# Patient Record
Sex: Female | Born: 1995
Health system: Southern US, Community
[De-identification: ages and names within clinical notes are randomized; demographics above are authoritative.]

## PROBLEM LIST (undated history)

## (undated) DIAGNOSIS — D649 Anemia, unspecified: Secondary | ICD-10-CM

## (undated) DIAGNOSIS — F419 Anxiety disorder, unspecified: Secondary | ICD-10-CM

## (undated) DIAGNOSIS — I5181 Takotsubo syndrome: Secondary | ICD-10-CM

## (undated) DIAGNOSIS — K859 Acute pancreatitis without necrosis or infection, unspecified: Secondary | ICD-10-CM

## (undated) DIAGNOSIS — I10 Essential (primary) hypertension: Secondary | ICD-10-CM

## (undated) HISTORY — DX: Anemia, unspecified: D64.9

---
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
## 2017-05-23 ENCOUNTER — Encounter (HOSPITAL_COMMUNITY): Payer: Self-pay | Admitting: *Deleted

## 2017-05-23 ENCOUNTER — Emergency Department (HOSPITAL_COMMUNITY)
Admission: EM | Admit: 2017-05-23 | Discharge: 2017-05-24 | Disposition: A | Discharge status: Home / Self Care | Payer: Medicaid Other | Attending: Emergency Medicine | Admitting: Emergency Medicine

## 2017-05-23 DIAGNOSIS — Z79899 Other long term (current) drug therapy: Secondary | ICD-10-CM | POA: Diagnosis not present

## 2017-05-23 DIAGNOSIS — N760 Acute vaginitis: Secondary | ICD-10-CM | POA: Diagnosis not present

## 2017-05-23 DIAGNOSIS — B9689 Other specified bacterial agents as the cause of diseases classified elsewhere: Secondary | ICD-10-CM

## 2017-05-23 DIAGNOSIS — N938 Other specified abnormal uterine and vaginal bleeding: Secondary | ICD-10-CM

## 2017-05-23 LAB — WET PREP, GENITAL
Sperm: NONE SEEN
Trich, Wet Prep: NONE SEEN
Yeast Wet Prep HPF POC: NONE SEEN

## 2017-05-23 LAB — PREGNANCY, URINE: Preg Test, Ur: NEGATIVE

## 2017-05-23 LAB — URINALYSIS, ROUTINE W REFLEX MICROSCOPIC
Glucose, UA: NEGATIVE mg/dL
Hgb urine dipstick: NEGATIVE
Ketones, ur: NEGATIVE mg/dL
Leukocytes, UA: NEGATIVE
Nitrite: NEGATIVE
Protein, ur: 100 mg/dL — AB
Specific Gravity, Urine: 1.034 — ABNORMAL HIGH (ref 1.005–1.030)
pH: 5 (ref 5.0–8.0)

## 2017-05-23 MED ORDER — NAPROXEN 250 MG PO TABS: 250.0000 mg | ORAL_TABLET | Freq: Two times a day (BID) | ORAL | 0 refills | Status: DC | PRN

## 2017-05-23 MED ORDER — METRONIDAZOLE 500 MG PO TABS: 500.0000 mg | ORAL_TABLET | Freq: Two times a day (BID) | ORAL | 0 refills | Status: DC

## 2017-05-23 NOTE — ED Provider Notes (Signed)
[facility] Provider Note   CSN: [id number] Arrival date & time: 05/23/17  2128     History   Chief Complaint Chief Complaint  Patient presents with  . Vaginal Bleeding    HPI Melissa Guzman is a 21 y.o. female.  HPI  Pt was seen at 2145. Per pt, c/o gradual onset and persistence of constant vaginal bleeding for the past week. Has been associated with pelvic "cramping." Pt states she took a home pregnancy test several days ago that was negative. Denies vaginal discharge, no N/V/D, no back pain, no CP/SOB, no fevers, no dysuria/hematuria.    History reviewed. No pertinent past medical history.  There are no active problems to display for this patient.   History reviewed. No pertinent surgical history.  OB History    No data available       Home Medications    Prior to Admission medications   Medication Sig Start Date End Date Taking? Authorizing Provider  Chlorphen-Pseudoephed-APAP (THERAFLU FLU/COLD PO) Take 2 tablets by mouth every 8 (eight) hours as needed.    [provider]  guaifenesin (ROBITUSSIN) 100 MG/5ML syrup Take 5-10 mLs (100-200 mg total) by mouth every 4 (four) hours as needed for cough. 01/27/14   Rancour, Jeannett Senior, MD  ibuprofen (ADVIL,MOTRIN) 600 MG tablet Take 1 tablet (600 mg total) by mouth 4 (four) times daily. Take with meals and at bedtime. 06/03/16   Ivery Quale, PA-C  methocarbamol (ROBAXIN) 500 MG tablet Take 1 tablet (500 mg total) by mouth 3 (three) times daily. 06/03/16   Ivery Quale, PA-C  Phenyleph-Doxylamine-DM-APAP (ALKA SELTZER PLUS PO) Take 2 tablets by mouth every 8 (eight) hours as needed (cold/flu).    [provider]    Family History History reviewed. No pertinent family history.  Social History Social History  Substance Use Topics  . Smoking status: Never Smoker  . Smokeless tobacco: Never Used  . Alcohol use No     Allergies   Patient has no known allergies.   Review of Systems Review of  Systems ROS: Statement: All systems negative except as marked or noted in the HPI; Constitutional: Negative for fever and chills. ; ; Eyes: Negative for eye pain, redness and discharge. ; ; ENMT: Negative for ear pain, hoarseness, nasal congestion, sinus pressure and sore throat. ; ; Cardiovascular: Negative for chest pain, palpitations, diaphoresis, dyspnea and peripheral edema. ; ; Respiratory: Negative for cough, wheezing and stridor. ; ; Gastrointestinal: Negative for nausea, vomiting, diarrhea, abdominal pain, blood in stool, hematemesis, jaundice and rectal bleeding. . ; ; Genitourinary: Negative for dysuria, flank pain and hematuria. ; ; GYN:  +pelvic pain, +vaginal bleeding, no vaginal discharge, no vulvar pain. ;; Musculoskeletal: Negative for back pain and neck pain. Negative for swelling and trauma.; ; Skin: Negative for pruritus, rash, abrasions, blisters, bruising and skin lesion.; ; Neuro: Negative for headache, lightheadedness and neck stiffness. Negative for weakness, altered level of consciousness, altered mental status, extremity weakness, paresthesias, involuntary movement, seizure and syncope.       Physical Exam Updated Vital Signs BP 140/89 (BP Location: Right Arm)   Pulse (!) 105   Temp 98.3 F (36.8 C) (Oral)   Resp 18   Ht 5\' 2"  (1.575 m)   Wt 72.2 kg (159 lb 4 oz)   LMP 04/05/2017   SpO2 98%   BMI 29.13 kg/m   Physical Exam 2150: Physical examination:  Nursing notes reviewed; Vital signs and O2 SAT reviewed;  Constitutional: Well developed, Well nourished,  Well hydrated, In no acute distress; Head:  Normocephalic, atraumatic; Eyes: EOMI, PERRL, No scleral icterus; ENMT: Mouth and pharynx normal, Mucous membranes moist; Neck: Supple, Full range of motion, No lymphadenopathy; Cardiovascular: Regular rate and rhythm, No gallop; Respiratory: Breath sounds clear & equal bilaterally, No wheezes.  Speaking full sentences with ease, Normal respiratory effort/excursion; Chest:  Nontender, Movement normal; Abdomen: Soft, Nontender, Nondistended, Normal bowel sounds; Genitourinary: No CVA tenderness. Pelvic exam performed with permission of pt and female ED RN assist during exam.  External genitalia w/o lesions. Vaginal vault without discharge, small amount of dark blood.  Cervix w/o lesions, not friable, GC/chlam and wet prep obtained and sent to lab.  Bimanual exam w/o CMT or adnexal tenderness. +mild suprapubic tenderness.;; Extremities: Pulses normal, No tenderness, No edema, No calf edema or asymmetry.; Neuro: AA&Ox3, Major CN grossly intact.  Speech clear. No gross focal motor or sensory deficits in extremities. Climbs on and off stretcher easily by herself. Gait steady.; Skin: Color normal, Warm, Dry.   ED Treatments / Results  Labs (all labs ordered are listed, but only abnormal results are displayed)   EKG  EKG Interpretation None       Radiology   Procedures Procedures (including critical care time)  Medications Ordered in ED Medications - No data to display   Initial Impression / Assessment and Plan / ED Course  I have reviewed the triage vital signs and the nursing notes.  Pertinent labs & imaging results that were available during my care of the patient were reviewed by me and considered in my medical decision making (see chart for details).  MDM Reviewed: previous chart, nursing note and vitals Reviewed previous: labs    Results for orders placed or performed during the hospital encounter of 05/23/17  Wet prep, genital  Result Value Ref Range   Yeast Wet Prep HPF POC NONE SEEN NONE SEEN   Trich, Wet Prep NONE SEEN NONE SEEN   Clue Cells Wet Prep HPF POC PRESENT (A) NONE SEEN   WBC, Wet Prep HPF POC FEW (A) NONE SEEN   Sperm NONE SEEN   Pregnancy, urine  Result Value Ref Range   Preg Test, Ur NEGATIVE NEGATIVE  Urinalysis, Routine w reflex microscopic  Result Value Ref Range   Color, Urine YELLOW YELLOW   APPearance CLEAR CLEAR     Specific Gravity, Urine 1.034 (H) 1.005 - 1.030   pH 5.0 5.0 - 8.0   Glucose, UA NEGATIVE NEGATIVE mg/dL   Hgb urine dipstick NEGATIVE NEGATIVE   Bilirubin Urine SMALL (A) NEGATIVE   Ketones, ur NEGATIVE NEGATIVE mg/dL   Protein, ur 179 (A) NEGATIVE mg/dL   Nitrite NEGATIVE NEGATIVE   Leukocytes, UA NEGATIVE NEGATIVE   RBC / HPF 0-5 0 - 5 RBC/hpf   WBC, UA 0-5 0 - 5 WBC/hpf   Bacteria, UA RARE (A) NONE SEEN   Squamous Epithelial / LPF 0-5 (A) NONE SEEN   Mucus PRESENT      2355:  Pregnancy test negative. Will tx for BV. Dx and testing d/w pt.  Questions answered.  Verb understanding, agreeable to d/c home with outpt f/u.     Final Clinical Impressions(s) / ED Diagnoses   Final diagnoses:  None    New Prescriptions New Prescriptions   No medications on file     Samuel Jester, DO 05/28/17 1511

## 2017-05-23 NOTE — ED Notes (Signed)
Pelvic cart to bedside 

## 2017-05-23 NOTE — Discharge Instructions (Signed)
Take the prescriptions as directed.  Call your regular OB/GYN doctor on Monday to schedule a follow up appointment within the next week.  Return to the Emergency Department immediately sooner if worsening.

## 2017-05-23 NOTE — ED Triage Notes (Signed)
Pt reports pelvic pain, vaginal bleeding, abdominal pain and cramping. Pt reports a having a "heavier" period then usual and is thinking she may be having a miscarriage. Pt took a pregnancy test several days ago and it was negative.

## 2017-05-25 LAB — GC/CHLAMYDIA PROBE AMP (~~LOC~~) NOT AT ARMC
Chlamydia: NEGATIVE
Neisseria Gonorrhea: NEGATIVE

## 2020-01-09 ENCOUNTER — Emergency Department (HOSPITAL_COMMUNITY)
Admission: EM | Admit: 2020-01-09 | Discharge: 2020-01-09 | Disposition: A | Discharge status: Home / Self Care | Payer: Medicaid Other | Attending: Emergency Medicine | Admitting: Emergency Medicine

## 2020-01-09 ENCOUNTER — Other Ambulatory Visit: Payer: Self-pay

## 2020-01-09 ENCOUNTER — Encounter (HOSPITAL_COMMUNITY): Payer: Self-pay | Admitting: *Deleted

## 2020-01-09 DIAGNOSIS — N3090 Cystitis, unspecified without hematuria: Secondary | ICD-10-CM

## 2020-01-09 DIAGNOSIS — R1033 Periumbilical pain: Secondary | ICD-10-CM | POA: Diagnosis present

## 2020-01-09 DIAGNOSIS — R10816 Epigastric abdominal tenderness: Secondary | ICD-10-CM | POA: Diagnosis not present

## 2020-01-09 DIAGNOSIS — N3000 Acute cystitis without hematuria: Secondary | ICD-10-CM | POA: Diagnosis not present

## 2020-01-09 DIAGNOSIS — R1030 Lower abdominal pain, unspecified: Secondary | ICD-10-CM

## 2020-01-09 LAB — CBC
HCT: 37.1 % (ref 36.0–46.0)
Hemoglobin: 11.6 g/dL — ABNORMAL LOW (ref 12.0–15.0)
MCH: 29.4 pg (ref 26.0–34.0)
MCHC: 31.3 g/dL (ref 30.0–36.0)
MCV: 93.9 fL (ref 80.0–100.0)
Platelets: 257 10*3/uL (ref 150–400)
RBC: 3.95 MIL/uL (ref 3.87–5.11)
RDW: 13 % (ref 11.5–15.5)
WBC: 10 10*3/uL (ref 4.0–10.5)
nRBC: 0 % (ref 0.0–0.2)

## 2020-01-09 LAB — COMPREHENSIVE METABOLIC PANEL
ALT: 15 U/L (ref 0–44)
AST: 21 U/L (ref 15–41)
Albumin: 4.2 g/dL (ref 3.5–5.0)
Alkaline Phosphatase: 50 U/L (ref 38–126)
Anion gap: 10 (ref 5–15)
BUN: 9 mg/dL (ref 6–20)
CO2: 24 mmol/L (ref 22–32)
Calcium: 9.3 mg/dL (ref 8.9–10.3)
Chloride: 103 mmol/L (ref 98–111)
Creatinine, Ser: 0.9 mg/dL (ref 0.44–1.00)
GFR calc Af Amer: >60 mL/min (ref >60)
GFR calc non Af Amer: >60 mL/min (ref >60)
Glucose, Bld: 99 mg/dL (ref 70–99)
Potassium: 4.1 mmol/L (ref 3.5–5.1)
Sodium: 137 mmol/L (ref 135–145)
Total Bilirubin: 0.6 mg/dL (ref 0.3–1.2)
Total Protein: 7.6 g/dL (ref 6.5–8.1)

## 2020-01-09 LAB — URINALYSIS, ROUTINE W REFLEX MICROSCOPIC
Bilirubin Urine: NEGATIVE
Glucose, UA: NEGATIVE mg/dL
Hgb urine dipstick: NEGATIVE
Ketones, ur: 5 mg/dL — AB
Leukocytes,Ua: NEGATIVE
Nitrite: POSITIVE — AB
Protein, ur: 30 mg/dL — AB
Specific Gravity, Urine: 1.03 (ref 1.005–1.030)
pH: 5 (ref 5.0–8.0)

## 2020-01-09 LAB — I-STAT BETA HCG BLOOD, ED (MC, WL, AP ONLY): I-stat hCG, quantitative: <5 m[IU]/mL (ref <5)

## 2020-01-09 LAB — LIPASE, BLOOD: Lipase: 22 U/L (ref 11–51)

## 2020-01-09 MED ORDER — NITROFURANTOIN MONOHYD MACRO 100 MG PO CAPS: 100.0000 mg | ORAL_CAPSULE | Freq: Two times a day (BID) | ORAL | 0 refills | Status: AC

## 2020-01-09 MED ORDER — SODIUM CHLORIDE 0.9% FLUSH: 3.0000 mL | Freq: Once | INTRAVENOUS | Status: DC

## 2020-01-09 NOTE — ED Notes (Signed)
Patient verbalizes understanding of discharge instructions. Opportunity for questioning and answers were provided. Armband removed by staff, pt discharged from ED ambulatory.   

## 2020-01-09 NOTE — ED Triage Notes (Signed)
Pt reports onset last night of lower abd pain, difficulty urinating and feels constipated, last bm was two days ago.

## 2020-01-09 NOTE — ED Provider Notes (Signed)
[facility] Provider Note   CSN: [id number] Arrival date & time: 01/09/20  0932     History Chief Complaint  Patient presents with  . Abdominal Pain    Melissa Guzman is a 24 y.o. female.  HPI  HPI Comments: Melissa [facility] is a 24 y.o. female who presents to the [facility] complaining of 2 days of mild constipation.  Patient reports some mild pain in the periumbilical region and suprapubic region diffusely.  She took 2 Motrin this morning with no relief.  She states her pain is constant, 10/10, worse when bearing down.  She states her last BM was 2 days ago and was normal at the time.  No hematochezia.  She states her last menstrual period was on March 28 and lasted 2 days and was normal for her.  She denies any fevers, chills, URI symptoms, chest pain, shortness of breath, nausea, vomiting, diarrhea, urinary changes, dizziness, syncope, vaginal discharge, vaginal bleeding.     History reviewed. No pertinent past medical history.  There are no problems to display for this patient.   History reviewed. No pertinent surgical history.   OB History   No obstetric history on file.     History reviewed. No pertinent family history.  Social History   Tobacco Use  . Smoking status: Never Smoker  . Smokeless tobacco: Never Used  Substance Use Topics  . Alcohol use: No  . Drug use: No    Home Medications Prior to Admission medications   Medication Sig Start Date End Date Taking? Authorizing Provider  metroNIDAZOLE (FLAGYL) 500 MG tablet Take 1 tablet (500 mg total) by mouth 2 (two) times daily. 05/23/17   Francine Graven, DO  naproxen (NAPROSYN) 250 MG tablet Take 1 tablet (250 mg total) by mouth 2 (two) times daily as needed for mild pain or moderate pain (take with food). 05/23/17   Francine Graven, DO    Allergies    Patient has no known allergies.  Review of Systems   Review of Systems  Constitutional: Negative for activity  change, appetite change, chills and fever.  HENT: Negative for congestion and rhinorrhea.   Respiratory: Negative for cough, chest tightness and shortness of breath.   Cardiovascular: Negative for chest pain and leg swelling.  Gastrointestinal: Positive for abdominal pain and constipation. Negative for diarrhea, nausea and vomiting.  Genitourinary: Negative for decreased urine volume, difficulty urinating, dysuria, hematuria, urgency, vaginal bleeding, vaginal discharge and vaginal pain.  Musculoskeletal: Negative for back pain and myalgias.  Neurological: Negative for syncope, numbness and headaches.  All other systems reviewed and are negative.  Physical Exam Updated Vital Signs BP 125/88 (BP Location: Right Arm)   Pulse 99   Temp 98.2 F (36.8 C) (Oral)   Resp 16   LMP 12/25/2019   SpO2 99%   Physical Exam Vitals and nursing note reviewed.  Constitutional:      General: She is not in acute distress.    Appearance: She is well-developed and normal weight. She is not ill-appearing, toxic-appearing or diaphoretic.  HENT:     Head: Normocephalic and atraumatic.     Mouth/Throat:     Mouth: Mucous membranes are moist.  Eyes:     Extraocular Movements: Extraocular movements intact.     Pupils: Pupils are equal, round, and reactive to light.  Cardiovascular:     Rate and Rhythm: Normal rate and regular rhythm.     Heart sounds: Normal heart sounds. No murmur. No friction  rub. No gallop.   Pulmonary:     Effort: Pulmonary effort is normal. No respiratory distress.     Breath sounds: Normal breath sounds. No stridor. No wheezing, rhonchi or rales.  Abdominal:     General: Abdomen is flat. Bowel sounds are normal. There is no distension or abdominal bruit. There are no signs of injury.     Palpations: Abdomen is soft.     Tenderness: There is abdominal tenderness in the epigastric area, periumbilical area and suprapubic area. There is no right CVA tenderness, left CVA tenderness,  guarding or rebound. Negative signs include Murphy's sign and McBurney's sign.     Hernia: No hernia is present.  Skin:    General: Skin is warm and dry.     Capillary Refill: Capillary refill takes less than 2 seconds.  Neurological:     General: No focal deficit present.     Mental Status: She is alert and oriented to person, place, and time.     Motor: No weakness.  Psychiatric:        Mood and Affect: Mood normal.        Behavior: Behavior normal.     ED Results / Procedures / Treatments   Labs (all labs ordered are listed, but only abnormal results are displayed) Labs Reviewed  CBC - Abnormal; Notable for the following components:      Result Value   Hemoglobin 11.6 (*)    All other components within normal limits  URINALYSIS, ROUTINE W REFLEX MICROSCOPIC - Abnormal; Notable for the following components:   APPearance HAZY (*)    Ketones, ur 5 (*)    Protein, ur 30 (*)    Nitrite POSITIVE (*)    Bacteria, UA MANY (*)    All other components within normal limits  LIPASE, BLOOD  COMPREHENSIVE METABOLIC PANEL  I-STAT BETA HCG BLOOD, ED ([facility], [facility], [facility] ONLY)    EKG None  Radiology No results found.  Procedures Procedures  Medications Ordered in ED Medications  sodium chloride flush (NS) 0.9 % injection 3 mL (has no administration in time range)    ED Course  I have reviewed the triage vital signs and the nursing notes.  Pertinent labs & imaging results that were available during my care of the patient were reviewed by me and considered in my medical decision making (see chart for details).    MDM Rules/Calculators/A&P                      Patient is a pleasant 24 year old African-American female that presents with 2 days of constipation and abdominal pain.  Physical exam is significant for some mild suprapubic and periumbilical pain with deep palpation of the abdomen.  Otherwise physical exam is reassuring.  She does not have a surgical abdomen.  She is afebrile.   Urinalysis is significant for nitrites and many bacteria.  Her hemoglobin is slightly decreased at 11.6.  No leukocytosis.  No signs of systemic infection.  She is not pregnant.  CMP is unremarkable.  She has no vaginal complaints at this time.  I discussed her lab results and is likely being acute cystitis.  We discussed the antibiotic regimen and that she needs to complete these in their entirety.  She states she will follow up with her primary care provider regarding this visit as well as to discuss symptom resolution.  She knows to return to the emergency [facility] with any new or worsening symptoms.  Her questions  were answered and she was amicable at the time of discharge.  Vital signs stable.  Final Clinical Impression(s) / ED Diagnoses Final diagnoses:  Cystitis  Lower abdominal pain    Rx / DC Orders ED Discharge Orders         Ordered    nitrofurantoin, macrocrystal-monohydrate, (MACROBID) 100 MG capsule  2 times daily     01/09/20 1306           Placido Sou, PA-C 01/09/20 1315    Melene Plan, DO 01/09/20 1318

## 2020-01-09 NOTE — Discharge Instructions (Signed)
Per discussion, please take your antibiotic in its entirety.  You are being given this for a UTI.  Please not hesitate to return the emergency department with any new or worsening symptoms.  Please follow-up with your primary care provider regarding this visit.

## 2020-07-30 ENCOUNTER — Encounter (HOSPITAL_BASED_OUTPATIENT_CLINIC_OR_DEPARTMENT_OTHER): Payer: Self-pay | Admitting: Emergency Medicine

## 2020-07-30 ENCOUNTER — Other Ambulatory Visit: Payer: Self-pay

## 2020-07-30 ENCOUNTER — Emergency Department (HOSPITAL_BASED_OUTPATIENT_CLINIC_OR_DEPARTMENT_OTHER): Payer: No Typology Code available for payment source

## 2020-07-30 ENCOUNTER — Other Ambulatory Visit (HOSPITAL_BASED_OUTPATIENT_CLINIC_OR_DEPARTMENT_OTHER): Payer: Self-pay | Admitting: Emergency Medicine

## 2020-07-30 ENCOUNTER — Emergency Department (HOSPITAL_BASED_OUTPATIENT_CLINIC_OR_DEPARTMENT_OTHER)
Admission: EM | Admit: 2020-07-30 | Discharge: 2020-07-30 | Disposition: A | Discharge status: Home / Self Care | Payer: No Typology Code available for payment source | Attending: Emergency Medicine | Admitting: Emergency Medicine

## 2020-07-30 DIAGNOSIS — S161XXA Strain of muscle, fascia and tendon at neck level, initial encounter: Secondary | ICD-10-CM

## 2020-07-30 DIAGNOSIS — M545 Low back pain, unspecified: Secondary | ICD-10-CM | POA: Diagnosis not present

## 2020-07-30 DIAGNOSIS — S0083XA Contusion of other part of head, initial encounter: Secondary | ICD-10-CM | POA: Diagnosis not present

## 2020-07-30 DIAGNOSIS — M546 Pain in thoracic spine: Secondary | ICD-10-CM

## 2020-07-30 DIAGNOSIS — S199XXA Unspecified injury of neck, initial encounter: Secondary | ICD-10-CM | POA: Diagnosis present

## 2020-07-30 MED ORDER — IBUPROFEN 800 MG PO TABS: 800.0000 mg | ORAL_TABLET | Freq: Three times a day (TID) | ORAL | 0 refills | Status: DC | PRN

## 2020-07-30 MED ORDER — METHOCARBAMOL 500 MG PO TABS: 500.0000 mg | ORAL_TABLET | Freq: Four times a day (QID) | ORAL | 0 refills | Status: DC | PRN

## 2020-07-30 MED FILL — IBUPROFEN 800 MG TAB: 800 | 7 days supply | Qty: 21 | Fill #0

## 2020-07-30 MED FILL — METHOCARBAMOL 500 MG TABS: 500 | 5 days supply | Qty: 20 | Fill #0
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
## 2020-09-29 ENCOUNTER — Ambulatory Visit (INDEPENDENT_AMBULATORY_CARE_PROVIDER_SITE_OTHER): Payer: Medicaid Other

## 2020-09-29 ENCOUNTER — Other Ambulatory Visit: Payer: Self-pay

## 2020-09-29 ENCOUNTER — Ambulatory Visit (HOSPITAL_COMMUNITY)
Admission: EM | Admit: 2020-09-29 | Discharge: 2020-09-29 | Disposition: A | Discharge status: Home / Self Care | Payer: Medicaid Other | Attending: Emergency Medicine | Admitting: Emergency Medicine

## 2020-09-29 ENCOUNTER — Encounter (HOSPITAL_COMMUNITY): Payer: Self-pay | Admitting: Emergency Medicine

## 2020-09-29 DIAGNOSIS — Z20822 Contact with and (suspected) exposure to covid-19: Secondary | ICD-10-CM | POA: Insufficient documentation

## 2020-09-29 DIAGNOSIS — U071 COVID-19: Secondary | ICD-10-CM | POA: Insufficient documentation

## 2020-09-29 DIAGNOSIS — J069 Acute upper respiratory infection, unspecified: Secondary | ICD-10-CM

## 2020-09-29 DIAGNOSIS — R059 Cough, unspecified: Secondary | ICD-10-CM | POA: Diagnosis not present

## 2020-09-29 LAB — SARS CORONAVIRUS 2 (TAT 6-24 HRS): SARS Coronavirus 2: POSITIVE — AB

## 2020-09-29 MED ORDER — BENZONATATE 200 MG PO CAPS: 200.0000 mg | ORAL_CAPSULE | Freq: Three times a day (TID) | ORAL | 0 refills | Status: DC | PRN

## 2020-09-29 MED ORDER — FLUTICASONE PROPIONATE 50 MCG/ACT NA SUSP: 2.0000 | Freq: Every day | NASAL | 0 refills | Status: DC

## 2020-09-29 MED ORDER — IBUPROFEN 600 MG PO TABS: 600.0000 mg | ORAL_TABLET | Freq: Four times a day (QID) | ORAL | 0 refills | Status: DC | PRN

## 2020-09-29 MED ORDER — HYDROCOD POLST-CPM POLST ER 10-8 MG/5ML PO SUER: 5.0000 mL | Freq: Two times a day (BID) | ORAL | 0 refills | Status: DC | PRN

## 2020-09-29 NOTE — ED Triage Notes (Signed)
PT C/O: cold sx onset 1.5 weeks associated w/cough, runny nose, sore throat, SOB, dyspnea when laying down.   O2 today was 88% but after taking deep breaths and coughing it went up to 92% sitting/resting.   Reports mother tested positive for COVID this morning.   DENIES: f/v/n/d  TAKING MEDS: none  A&O x4... NAD... Ambulatory

## 2020-09-29 NOTE — ED Provider Notes (Signed)
HPI  SUBJECTIVE:  Melissa Guzman is a 25 y.o. female who presents with being "sick" for the past week and a half.  Her mother who lives with her tested positive for Covid today.  She reports body aches, headaches and nasal congestion, rhinorrhea, postnasal drip, cough, shortness of breath with lying down only.  No fevers, sore throat, loss of sense of smell or taste, wheezing, nausea, vomiting or diarrhea, abdominal pain.  No ear pain, sinus pain or pressure.  States that she has been unable to sleep secondary to the cough.  She has had no known flu exposure.  She had the first dose of Covid vaccine in mid November.  She did not get the flu vaccine.  No antipyretic in the past 6 hours.  She has tried over-the-counter cold and flu medicines, hot tea with improvement in her symptoms.  No aggravating factors.  Patient has no past medical history.  LMP: Last week.  Denies possibility pregnant.  PMD: [facility].    History reviewed. No pertinent past medical history.  History reviewed. No pertinent surgical history.  History reviewed. No pertinent family history.  Social History   Tobacco Use  . Smoking status: Never Smoker  . Smokeless tobacco: Never Used  Vaping Use  . Vaping Use: Never used  Substance Use Topics  . Alcohol use: No  . Drug use: No    No current facility-administered medications for this encounter.  Current Outpatient Medications:  .  benzonatate (TESSALON) 200 MG capsule, Take 1 capsule (200 mg total) by mouth 3 (three) times daily as needed for cough., Disp: 30 capsule, Rfl: 0 .  chlorpheniramine-HYDROcodone (TUSSIONEX PENNKINETIC ER) 10-8 MG/5ML SUER, Take 5 mLs by mouth every 12 (twelve) hours as needed for cough., Disp: 60 mL, Rfl: 0 .  fluticasone (FLONASE) 50 MCG/ACT nasal spray, Place 2 sprays into both nostrils daily., Disp: 16 g, Rfl: 0 .  ibuprofen (ADVIL) 600 MG tablet, Take 1 tablet (600 mg total) by mouth every 6 (six) hours as needed., Disp:  30 tablet, Rfl: 0  No Known Allergies   ROS  As noted in HPI.   Physical Exam  BP 133/88 (BP Location: Left Arm)   Pulse 72   Temp 99.1 F (37.3 C) (Oral)   Resp 16   LMP 09/22/2020   SpO2 100%   Constitutional: Well developed, well nourished, no acute distress.  Coughing Eyes:  EOMI, conjunctiva normal bilaterally HENT: Normocephalic, atraumatic,mucus membranes moist.  Swollen erythematous turbinates, clear nasal congestion.  No maxillary, frontal sinus tenderness.  Normal oropharynx, uvula midline.  Positive cobblestoning and postnasal drip. Neck: Positive cervical lymphadenopathy Respiratory: Normal inspiratory effort, lungs clear bilaterally Cardiovascular: Normal rate regular rhythm no murmurs rubs gallops GI: nondistended skin: No rash, skin intact Musculoskeletal: no deformities Neurologic: Alert & oriented x 3, no focal neuro deficits Psychiatric: Speech and behavior appropriate   ED Course   Medications - No data to display  Orders Placed This Encounter  Procedures  . SARS CORONAVIRUS 2 (TAT 6-24 HRS) Nasopharyngeal Nasopharyngeal Swab    Standing Status:   Standing    Number of Occurrences:   1    Order Specific Question:   Is this test for diagnosis or screening    Answer:   Diagnosis of ill patient    Order Specific Question:   Symptomatic for COVID-19 as defined by CDC    Answer:   Yes    Order Specific Question:   Date of Symptom Onset  Answer:   09/19/2020    Order Specific Question:   Hospitalized for COVID-19    Answer:   No    Order Specific Question:   Admitted to ICU for COVID-19    Answer:   No    Order Specific Question:   Previously tested for COVID-19    Answer:   No    Order Specific Question:   Resident in a congregate (group) care setting    Answer:   No    Order Specific Question:   Employed in healthcare setting    Answer:   No    Order Specific Question:   Pregnant    Answer:   No    Order Specific Question:   Has patient  completed COVID vaccination(s) (2 doses of Pfizer/Moderna 1 dose of [employer])    Answer:   No  . DG Chest 2 View    Standing Status:   Standing    Number of Occurrences:   1    Order Specific Question:   Reason for Exam (SYMPTOM  OR DIAGNOSIS REQUIRED)    Answer:   cough covid exposure r/o PNA    Results for orders placed or performed during the hospital encounter of 09/29/20 (from the past 24 hour(s))  SARS CORONAVIRUS 2 (TAT 6-24 HRS) Nasopharyngeal Nasopharyngeal Swab     Status: Abnormal   Collection Time: 09/29/20  5:10 PM   Specimen: Nasopharyngeal Swab  Result Value Ref Range   SARS Coronavirus 2 POSITIVE (A) NEGATIVE   DG Chest 2 View  Result Date: 09/29/2020 CLINICAL DATA:  25 year old female with history of cough. COVID exposure. Evaluate for pneumonia. EXAM: CHEST - 2 VIEW COMPARISON:  No priors. FINDINGS: Lung volumes are normal. No consolidative airspace disease. No pleural effusions. No pneumothorax. No pulmonary nodule or mass noted. Pulmonary vasculature and the cardiomediastinal silhouette are within normal limits. IMPRESSION: No radiographic evidence of acute cardiopulmonary disease. Electronically Signed   By: Trudie Reed M.D.   On: 09/29/2020 15:40    ED Clinical Impression  1. COVID-19 virus infection   2. Viral URI with cough   3. Encounter for laboratory testing for COVID-19 virus      ED Assessment/Plan  Repeat pulse oximeter 100%.  She is comfortable, in no respiratory distress.  Checking Covid given close exposure to Covid.  Seriously doubt flu thus testing for this was not done.  Also check chest x-[place]ve given duration of symptoms.  She has no sinus tenderness that would be suggestive of a sinusitis.  [place] Narcotic database reviewed for this patient, and feel that the risk/benefit ratio today is favorable for proceeding with a prescription for controlled substance.  No opiate prescriptions in 2 years  Reviewed imaging independently.  No  pneumonia.  See radiology report for full details.  No pneumonia on x-Chiu, no evidence of sinusitis on exam, she has no ear complaints.  Unsure what I would be treating with antibiotics, so with holding them for now.  Plan to send home with Flonase, Mucinex D, saline nasal irrigation, Tylenol/ibuprofen, Tessalon for the cough during the day, Tussionex for the cough at night.  She may be a candidate for monoclonal body infusion as she is partially vaccinated, however she has no other medical comorbidities.  Advised her to get a pulse oximeter and to walk daily if her Covid is positive.  Follow-up with PMD as needed.  To the ER if she gets worse.  Covid positive.  Sent message to monoclonal  body team.  Discussed labs, imaging, MDM, treatment plan, and plan for follow-up with patient. Discussed sn/sx that should prompt return to the ED. patient agrees with plan.   Meds ordered this encounter  Medications  . benzonatate (TESSALON) 200 MG capsule    Sig: Take 1 capsule (200 mg total) by mouth 3 (three) times daily as needed for cough.    Dispense:  30 capsule    Refill:  0  . ibuprofen (ADVIL) 600 MG tablet    Sig: Take 1 tablet (600 mg total) by mouth every 6 (six) hours as needed.    Dispense:  30 tablet    Refill:  0  . fluticasone (FLONASE) 50 MCG/ACT nasal spray    Sig: Place 2 sprays into both nostrils daily.    Dispense:  16 g    Refill:  0  . chlorpheniramine-HYDROcodone (TUSSIONEX PENNKINETIC ER) 10-8 MG/5ML SUER    Sig: Take 5 mLs by mouth every 12 (twelve) hours as needed for cough.    Dispense:  60 mL    Refill:  0    *This clinic note was created using [employer]. Therefore, there may be occasional mistakes despite careful proofreading.   ?    Melynda Ripple, MD 09/30/20 1020

## 2020-09-29 NOTE — Discharge Instructions (Addendum)
Someone will contact you if your Covid comes back positive.  Flonase, saline nasal irrigation [facility] Med rinse and distilled water as often as you want, Mucinex D for the nasal congestion.  1000 mg of Tylenol combined with 600 mg of ibuprofen together 3-4 times a day as needed for body aches, headaches.  Tessalon for the cough during the day, Tussionex for the cough at night.  Get a pulse oximeter walk daily if your Covid is positive.

## 2021-04-01 ENCOUNTER — Other Ambulatory Visit: Payer: Self-pay

## 2021-04-01 ENCOUNTER — Encounter (HOSPITAL_COMMUNITY): Payer: Self-pay | Admitting: *Deleted

## 2021-04-01 ENCOUNTER — Ambulatory Visit (HOSPITAL_COMMUNITY)
Admission: EM | Admit: 2021-04-01 | Discharge: 2021-04-01 | Disposition: A | Discharge status: Home / Self Care | Payer: Medicaid Other | Attending: Family Medicine | Admitting: Family Medicine

## 2021-04-01 DIAGNOSIS — R519 Headache, unspecified: Secondary | ICD-10-CM

## 2021-04-01 NOTE — ED Triage Notes (Signed)
Pt reports HA that started yesterday in the morning

## 2021-04-03 NOTE — ED Provider Notes (Signed)
  [facility]   [id number] 04/01/21 Arrival Time: 1227  ASSESSMENT & PLAN:  1. Acute nonintractable headache, unspecified headache type     Normal neurological exam. Headache has resolved. Work note provided. No indication for neurodiagnostic workup at this time.  Recommend:  Follow-up Information     [facility], [facility] .   Specialty: Pediatrics Why: As needed. Contact information: [address] [place] [address] [phone]                  Reviewed expectations re: course of current medical issues. Questions answered. Outlined signs and symptoms indicating need for more acute intervention. Patient verbalized understanding. After Visit Summary given.   SUBJECTIVE: History from: Patient Patient is able to give a clear and coherent history.  Melissa Guzman is a 25 y.o. female who presents with complaint of a bad headache yesterday; frontal; missed work secondary. Without n/v/visual changes. Better this morning. Needs work note.    OBJECTIVE:  Vitals:   04/01/21 1239  BP: 118/75  Pulse: 95  Resp: 18  Temp: 98.9 F (37.2 C)  TempSrc: Oral  SpO2: 98%    General appearance: alert; NAD HENT: normocephalic; atraumatic Eyes: PERRLA; EOMI; conjunctivae normal Neck: supple with FROM Extremities: no edema; symmetrical with no gross deformities Skin: warm and dry Neurologic: alert; speech is fluent and clear without dysarthria or aphasia; CN 2-12 grossly intact; no facial droop; normal gait Psychological: alert and cooperative; normal mood and affect   No Known Allergies  History reviewed. No pertinent past medical history. Social History   Socioeconomic History   Marital status: Single    Spouse name: Not on file   Number of children: Not on file   Years of education: Not on file   Highest education level: Not on file  Occupational History   Not on file  Tobacco Use   Smoking status: Never   Smokeless tobacco:  Never  Vaping Use   Vaping Use: Never used  Substance and Sexual Activity   Alcohol use: No   Drug use: No   Sexual activity: Not on file  Other Topics Concern   Not on file  Social History Narrative   Not on file   Social Determinants of Health   Financial Resource Strain: Not on file  Food Insecurity: Not on file  Transportation Needs: Not on file  Physical Activity: Not on file  Stress: Not on file  Social Connections: Not on file  Intimate Partner Violence: Not on file   History reviewed. No pertinent family history. History reviewed. No pertinent surgical history.    Mardella Layman, MD 04/03/21 [phone]

## 2021-06-27 DIAGNOSIS — B9689 Other specified bacterial agents as the cause of diseases classified elsewhere: Secondary | ICD-10-CM | POA: Diagnosis not present

## 2021-06-27 DIAGNOSIS — N760 Acute vaginitis: Secondary | ICD-10-CM | POA: Diagnosis not present

## 2021-06-27 DIAGNOSIS — Z3201 Encounter for pregnancy test, result positive: Secondary | ICD-10-CM | POA: Diagnosis not present

## 2021-06-27 DIAGNOSIS — N898 Other specified noninflammatory disorders of vagina: Secondary | ICD-10-CM | POA: Diagnosis not present

## 2021-07-04 ENCOUNTER — Other Ambulatory Visit: Payer: Self-pay

## 2021-07-04 ENCOUNTER — Inpatient Hospital Stay (HOSPITAL_COMMUNITY): Payer: 59

## 2021-07-04 ENCOUNTER — Encounter (HOSPITAL_COMMUNITY): Payer: Self-pay | Admitting: Family Medicine

## 2021-07-04 ENCOUNTER — Inpatient Hospital Stay (HOSPITAL_COMMUNITY)
Admission: AD | Admit: 2021-07-04 | Discharge: 2021-07-04 | Disposition: A | Discharge status: Home / Self Care | Payer: 59 | Attending: Family Medicine | Admitting: Family Medicine

## 2021-07-04 DIAGNOSIS — R1030 Lower abdominal pain, unspecified: Secondary | ICD-10-CM | POA: Diagnosis not present

## 2021-07-04 DIAGNOSIS — R109 Unspecified abdominal pain: Secondary | ICD-10-CM

## 2021-07-04 DIAGNOSIS — O26891 Other specified pregnancy related conditions, first trimester: Secondary | ICD-10-CM | POA: Insufficient documentation

## 2021-07-04 DIAGNOSIS — Z3A01 Less than 8 weeks gestation of pregnancy: Secondary | ICD-10-CM | POA: Insufficient documentation

## 2021-07-04 LAB — COMPREHENSIVE METABOLIC PANEL
ALT: 15 U/L (ref 0–44)
AST: 19 U/L (ref 15–41)
Albumin: 3.9 g/dL (ref 3.5–5.0)
Alkaline Phosphatase: 47 U/L (ref 38–126)
Anion gap: 7 (ref 5–15)
BUN: 9 mg/dL (ref 6–20)
CO2: 26 mmol/L (ref 22–32)
Calcium: 9.1 mg/dL (ref 8.9–10.3)
Chloride: 103 mmol/L (ref 98–111)
Creatinine, Ser: 0.76 mg/dL (ref 0.44–1.00)
GFR, Estimated: >60 mL/min (ref >60)
Glucose, Bld: 85 mg/dL (ref 70–99)
Potassium: 3.6 mmol/L (ref 3.5–5.1)
Sodium: 136 mmol/L (ref 135–145)
Total Bilirubin: 0.1 mg/dL — ABNORMAL LOW (ref 0.3–1.2)
Total Protein: 7.1 g/dL (ref 6.5–8.1)

## 2021-07-04 LAB — URINALYSIS, ROUTINE W REFLEX MICROSCOPIC
Bilirubin Urine: NEGATIVE
Glucose, UA: NEGATIVE mg/dL
Hgb urine dipstick: NEGATIVE
Ketones, ur: NEGATIVE mg/dL
Nitrite: NEGATIVE
Protein, ur: NEGATIVE mg/dL
Specific Gravity, Urine: 1.024 (ref 1.005–1.030)
pH: 6 (ref 5.0–8.0)

## 2021-07-04 LAB — CBC WITH DIFFERENTIAL/PLATELET
Abs Immature Granulocytes: 0.01 10*3/uL (ref 0.00–0.07)
Basophils Absolute: 0 10*3/uL (ref 0.0–0.1)
Basophils Relative: 0 %
Eosinophils Absolute: 0.1 10*3/uL (ref 0.0–0.5)
Eosinophils Relative: 1 %
HCT: 34.6 % — ABNORMAL LOW (ref 36.0–46.0)
Hemoglobin: 11 g/dL — ABNORMAL LOW (ref 12.0–15.0)
Immature Granulocytes: 0 %
Lymphocytes Relative: 36 %
Lymphs Abs: 2.4 10*3/uL (ref 0.7–4.0)
MCH: 28.7 pg (ref 26.0–34.0)
MCHC: 31.8 g/dL (ref 30.0–36.0)
MCV: 90.3 fL (ref 80.0–100.0)
Monocytes Absolute: 0.6 10*3/uL (ref 0.1–1.0)
Monocytes Relative: 9 %
Neutro Abs: 3.6 10*3/uL (ref 1.7–7.7)
Neutrophils Relative %: 54 %
Platelets: 298 10*3/uL (ref 150–400)
RBC: 3.83 MIL/uL — ABNORMAL LOW (ref 3.87–5.11)
RDW: 12.4 % (ref 11.5–15.5)
WBC: 6.8 10*3/uL (ref 4.0–10.5)
nRBC: 0 % (ref 0.0–0.2)

## 2021-07-04 LAB — ABO/RH: ABO/RH(D): O POS

## 2021-07-04 LAB — HIV ANTIBODY (ROUTINE TESTING W REFLEX): HIV Screen 4th Generation wRfx: NONREACTIVE

## 2021-07-04 LAB — HCG, QUANTITATIVE, PREGNANCY: hCG, Beta Chain, Quant, S: 27943 m[IU]/mL — ABNORMAL HIGH (ref <5)

## 2021-07-04 LAB — POCT PREGNANCY, URINE: Preg Test, Ur: POSITIVE — AB

## 2021-07-04 NOTE — MAU Provider Note (Signed)
History     CSN: [id number]  Arrival date and time: 07/04/21 1601   Event Date/Time   First Provider Initiated Contact with Patient 07/04/21 1711      Chief Complaint  Patient presents with   Abdominal Pain   HPI  Melissa Guzman is a 25 y.o. female G1P0 @ [redacted]w[redacted]d  here in MAU with lower abdominal pain. The pain started prior to finding out she was pregnant, however has been more noticeable since finding out. The pain is all across her lower abdomen. When she lays on her side the pain shifts to which ever side she is laying on. Sitting down makes it feel better; up moving around makes it worse. No bleeding. She has not tried anything for the pain.   OB History     Gravida  1   Para      Term      Preterm      AB      Living         SAB      IAB      Ectopic      Multiple      Live Births              History reviewed. No pertinent past medical history.  History reviewed. No pertinent surgical history.  History reviewed. No pertinent family history.  Social History   Tobacco Use   Smoking status: Never   Smokeless tobacco: Never  Vaping Use   Vaping Use: Never used  Substance Use Topics   Alcohol use: No   Drug use: No    Allergies: No Known Allergies  Medications Prior to Admission  Medication Sig Dispense Refill Last Dose   metroNIDAZOLE (FLAGYL) 500 MG tablet Take 500 mg by mouth 2 (two) times daily.   07/04/2021   Prenatal Vit-Fe Fumarate-FA (PREPLUS) 27-1 MG TABS Take 1 tablet by mouth daily.   07/04/2021   benzonatate (TESSALON) 200 MG capsule Take 1 capsule (200 mg total) by mouth 3 (three) times daily as needed for cough. 30 capsule 0 Unknown   chlorpheniramine-HYDROcodone (TUSSIONEX PENNKINETIC ER) 10-8 MG/5ML SUER Take 5 mLs by mouth every 12 (twelve) hours as needed for cough. 60 mL 0 Unknown   fluticasone (FLONASE) 50 MCG/ACT nasal spray Place 2 sprays into both nostrils daily. 16 g 0 Unknown   ibuprofen (ADVIL) 600 MG tablet Take 1  tablet (600 mg total) by mouth every 6 (six) hours as needed. 30 tablet 0 Unknown   Results for orders placed or performed during the hospital encounter of 07/04/21 (from the past 48 hour(s))  Pregnancy, urine POC     Status: Abnormal   Collection Time: 07/04/21  4:25 PM  Result Value Ref Range   Preg Test, Ur POSITIVE (A) NEGATIVE    Comment:        THE SENSITIVITY OF THIS METHODOLOGY IS >24 mIU/mL   Urinalysis, Routine w reflex microscopic Urine, Clean Catch     Status: Abnormal   Collection Time: 07/04/21  5:00 PM  Result Value Ref Range   Color, Urine YELLOW YELLOW   APPearance HAZY (A) CLEAR   Specific Gravity, Urine 1.024 1.005 - 1.030   pH 6.0 5.0 - 8.0   Glucose, UA NEGATIVE NEGATIVE mg/dL   Hgb urine dipstick NEGATIVE NEGATIVE   Bilirubin Urine NEGATIVE NEGATIVE   Ketones, ur NEGATIVE NEGATIVE mg/dL   Protein, ur NEGATIVE NEGATIVE mg/dL   Nitrite NEGATIVE NEGATIVE   Leukocytes,Ua TRACE (  A) NEGATIVE   RBC / HPF 0-5 0 - 5 RBC/hpf   WBC, UA 0-5 0 - 5 WBC/hpf   Bacteria, UA MANY (A) NONE SEEN   Squamous Epithelial / LPF 6-10 0 - 5   Mucus PRESENT     Comment: Performed at [facility], [address] [place], [place] [address]  CBC with Differential/Platelet     Status: Abnormal   Collection Time: 07/04/21  5:21 PM  Result Value Ref Range   WBC 6.8 4.0 - 10.5 K/uL   RBC 3.83 (L) 3.87 - 5.11 MIL/uL   Hemoglobin 11.0 (L) 12.0 - 15.0 g/dL   HCT 34.7 (L) 42.5 - 95.6 %   MCV 90.3 80.0 - 100.0 fL   MCH 28.7 26.0 - 34.0 pg   MCHC 31.8 30.0 - 36.0 g/dL   RDW 38.7 56.4 - 33.2 %   Platelets 298 150 - 400 K/uL   nRBC 0.0 0.0 - 0.2 %   Neutrophils Relative % 54 %   Neutro Abs 3.6 1.7 - 7.7 K/uL   Lymphocytes Relative 36 %   Lymphs Abs 2.4 0.7 - 4.0 K/uL   Monocytes Relative 9 %   Monocytes Absolute 0.6 0.1 - 1.0 K/uL   Eosinophils Relative 1 %   Eosinophils Absolute 0.1 0.0 - 0.5 K/uL   Basophils Relative 0 %   Basophils Absolute 0.0 0.0 - 0.1 K/uL   Immature  Granulocytes 0 %   Abs Immature Granulocytes 0.01 0.00 - 0.07 K/uL    Comment: Performed at [facility], [address], [place], [place] [address]  Comprehensive metabolic panel     Status: Abnormal   Collection Time: 07/04/21  5:21 PM  Result Value Ref Range   Sodium 136 135 - 145 mmol/L   Potassium 3.6 3.5 - 5.1 mmol/L   Chloride 103 98 - 111 mmol/L   CO2 26 22 - 32 mmol/L   Glucose, Bld 85 70 - 99 mg/dL    Comment: Glucose reference range applies only to samples taken after fasting for at least 8 hours.   BUN 9 6 - 20 mg/dL   Creatinine, Ser 4.16 0.44 - 1.00 mg/dL   Calcium 9.1 8.9 - 60.6 mg/dL   Total Protein 7.1 6.5 - 8.1 g/dL   Albumin 3.9 3.5 - 5.0 g/dL   AST 19 15 - 41 U/L   ALT 15 0 - 44 U/L   Alkaline Phosphatase 47 38 - 126 U/L   Total Bilirubin 0.1 (L) 0.3 - 1.2 mg/dL   GFR, Estimated >30 >16 mL/min    Comment: (NOTE) Calculated using the CKD-EPI Creatinine Equation (2021)    Anion gap 7 5 - 15    Comment: Performed at [facility], [address], [place], [place] [address]  hCG, quantitative, pregnancy     Status: Abnormal   Collection Time: 07/04/21  5:21 PM  Result Value Ref Range   hCG, Beta Chain, Quant, S 27,943 (H) <5 mIU/mL    Comment:          GEST. AGE      CONC.  (mIU/mL)   <=1 WEEK        5 - 50     2 WEEKS       50 - 500     3 WEEKS       100 - 10,000     4 WEEKS     1,000 - 30,000  5 WEEKS     3,500 - 115,000   6-8 WEEKS     12,000 - 270,000    12 WEEKS     15,000 - 220,000        FEMALE AND NON-PREGNANT FEMALE:     LESS THAN 5 mIU/mL Performed at [facility], [address], [place], [place] [address]   ABO/Rh     Status: None   Collection Time: 07/04/21  5:25 PM  Result Value Ref Range   ABO/RH(D) O POS    No rh immune globuloin      NOT A RH IMMUNE GLOBULIN CANDIDATE, PT RH POSITIVE Performed at [facility], [address], [place], [place] [address]     US OB LESS THAN 14 WEEKS WITH [place]  TRANSVAGINAL  Result Date: 07/04/2021 CLINICAL DATA:  Pelvic pain and cramping. EXAM: OBSTETRIC <14 WK [place] AND TRANSVAGINAL OB US TECHNIQUE: Both transabdominal and transvaginal ultrasound examinations were performed for complete evaluation of the gestation as well as the maternal uterus, adnexal regions, and pelvic cul-de-sac. Transvaginal technique was performed to assess early pregnancy. COMPARISON:  None. FINDINGS: Intrauterine gestational sac: Single Yolk sac:  Visualized. Embryo:  Not Visualized. MSD: 13 mm   6 w   0 d Subchorionic hemorrhage:  None visualized. Maternal uterus/adnexae: Both ovaries are normal in appearance. No mass or abnormal free fluid identified. IMPRESSION: Single intrauterine gestational sac, with estimated gestational age of [redacted] weeks 0 days by mean sac diameter. Consider correlation with serial b-hCG levels, and followup ultrasound to assess viability in 10-14 days. No evidence of adnexal mass or free fluid. Electronically Signed   By: Danae Orleans M.D.   On: 07/04/2021 18:38     Review of Systems  Constitutional:  Negative for fever.  Gastrointestinal:  Positive for abdominal pain.  Genitourinary:  Negative for vaginal bleeding and vaginal discharge.  Physical Exam   Blood pressure 132/85, pulse 97, temperature 98.9 F (37.2 C), temperature source Oral, resp. rate 17, height 5\' 2"  (1.575 m), weight 63 kg, last menstrual period 05/27/2021, SpO2 100 %.  Physical Exam Vitals and nursing note reviewed.  Constitutional:      General: She is not in acute distress.    Appearance: She is well-developed. She is not ill-appearing, toxic-appearing or diaphoretic.  HENT:     Head: Normocephalic.  Abdominal:     Tenderness: There is generalized abdominal tenderness. There is no guarding or rebound.  Genitourinary:    Comments: GC collected by RN.  Skin:    General: Skin is warm.  Neurological:     Mental Status: She is alert.   MAU Course  Procedures None  MDM  O  positive blood type.  Wet prep & GC HIV, CBC, Hcg, ABO 05/29/2021 OB transvaginal    Assessment and Plan   A:  [redacted] weeks gestation of pregnancy - Plan: Discharge patient  Abdominal pain in pregnancy, first trimester - Plan: US OB LESS THAN 14 WEEKS WITH OB TRANSVAGINAL, US OB LESS THAN 14 WEEKS WITH OB TRANSVAGINAL, Discharge patient   P:  SIUP  Discharge home in stable condition Discussed how first trimester [place] can be normal in the first trimester Return to MAU if symptoms worsen Continue prenatal vitamins  Start prenatal care.   [place] I, NP 07/04/2021 8:07 PM

## 2021-07-04 NOTE — MAU Note (Signed)
Pt reports lower abd cramping on and off for a few days. Ws given Rx for BV from her PCP and she just started the medication yesterday. Denies any vag bleeding or discharge.

## 2021-07-05 LAB — GC/CHLAMYDIA PROBE AMP (~~LOC~~) NOT AT ARMC
Chlamydia: NEGATIVE
Comment: NEGATIVE
Comment: NORMAL
Neisseria Gonorrhea: NEGATIVE

## 2021-07-30 ENCOUNTER — Telehealth: Payer: Self-pay | Admitting: General Practice

## 2021-07-30 NOTE — Telephone Encounter (Signed)
Patient called and left message on nurse voicemail line stating she has an upcoming appt and wants to know if its virtual or in person.  Called patient and reviewed upcoming appts and locations. Patient verbalized understanding.

## 2021-08-01 ENCOUNTER — Telehealth (INDEPENDENT_AMBULATORY_CARE_PROVIDER_SITE_OTHER): Payer: Medicaid Other

## 2021-08-01 DIAGNOSIS — Z3480 Encounter for supervision of other normal pregnancy, unspecified trimester: Secondary | ICD-10-CM

## 2021-08-01 DIAGNOSIS — Z3A00 Weeks of gestation of pregnancy not specified: Secondary | ICD-10-CM

## 2021-08-01 DIAGNOSIS — Z3491 Encounter for supervision of normal pregnancy, unspecified, first trimester: Secondary | ICD-10-CM | POA: Insufficient documentation

## 2021-08-01 MED ORDER — BLOOD PRESSURE KIT DEVI: 1.0000 | Freq: Once | 0 refills | Status: AC

## 2021-08-01 MED ORDER — PRENATAL ADULT GUMMY/DHA/FA 0.4-25 MG PO CHEW: 1.0000 | CHEWABLE_TABLET | Freq: Every day | ORAL | 11 refills | Status: DC

## 2021-08-01 NOTE — Progress Notes (Signed)
New OB Intake  I connected with  Essence Butrum on 08/01/21 at  3:15 PM EDT by MyChart Video Visit and verified that I am speaking with the correct person using two identifiers. Nurse is located at [facility] and pt is located at home.  I discussed the limitations, risks, security and privacy concerns of performing an evaluation and management service by telephone and the availability of in person appointments. I also discussed with the patient that there may be a patient responsible charge related to this service. The patient expressed understanding and agreed to proceed.  I explained I am completing New OB Intake today. We discussed her EDD of 03/03/22 that is based on LMP of 05/27/21. Pt is G1/P0. I reviewed her allergies, medications, Medical/Surgical/OB history, and appropriate screenings. I informed her of [facility] services. [facility] information placed in AVS. Based on history, this is a low risk pregnancy.  Patient Active Problem List   Diagnosis Date Noted   Supervision of low-risk pregnancy, first trimester 08/01/2021   Concerns addressed today None  Delivery Plans:  Plans to deliver at [facility]   MyChart/Babyscripts MyChart access verified. I explained pt will have some visits in office and some virtually. Babyscripts instructions given and order placed. Patient verifies receipt of registration text/e-mail. Account successfully created and app downloaded.  Blood Pressure Cuff  Blood pressure cuff ordered for patient to pick-up from [employer]. Explained after first prenatal appt pt will check weekly and document in Babyscripts.  Weight scale: Patient has weight scale.  Anatomy US Explained first scheduled [place] will be around 19 weeks. Anatomy US scheduled for 10/08/21 at 10 AM.  Labs Discussed Avelina Laine genetic screening with patient. Would like both Panorama and Horizon drawn at new OB visit. Routine prenatal labs needed.  COVID Vaccine Patient has had Moderna COVID vaccine x2. Will bring  card to new OB appt.   Mom Baby Dyad Candidate?    Yes-- enrolled in program and new OB rescheduled.  Social Determinants of Health Food Insecurity: Patient denies food insecurity. WIC Referral: Prenatal navigator will discuss at new OB appt. Transportation: Patient expressed transportation needs. Transportation Services reviewed with patient; patient registered and phone number provided for patient to schedule rides. Childcare: Discussed no children allowed at ultrasound appointments. Offered childcare services; patient declines childcare services at this time.  First visit review I reviewed new OB appt with pt. I explained she will have a provider visit that includes physical exam and OB lab work. Explained pt will be seen by Alvester Morin, MD at first visit; encounter routed to appropriate provider. Explained that patient will be seen by pregnancy navigator following visit with provider.   Marjo Bicker, RN 08/01/2021  4:01 PM

## 2021-08-02 NOTE — Progress Notes (Signed)
Attestation of Attending Supervision of clinical support staff: I agree with the care provided to this patient and was available for any consultation.  I have reviewed the RN's note and chart. I was available for consult and to see the patient if needed.   Illeana Edick MD MPH Attending Physician Faculty Practice- [facility]  

## 2021-08-07 ENCOUNTER — Encounter: Payer: Medicaid Other | Admitting: Obstetrics and Gynecology

## 2021-08-27 ENCOUNTER — Inpatient Hospital Stay (HOSPITAL_COMMUNITY)
Admission: AD | Admit: 2021-08-27 | Discharge: 2021-08-27 | Disposition: A | Discharge status: Home / Self Care | Payer: 59 | Attending: Obstetrics and Gynecology | Admitting: Obstetrics and Gynecology

## 2021-08-27 ENCOUNTER — Encounter (HOSPITAL_COMMUNITY): Payer: Self-pay | Admitting: Obstetrics and Gynecology

## 2021-08-27 ENCOUNTER — Other Ambulatory Visit: Payer: Self-pay

## 2021-08-27 DIAGNOSIS — O26891 Other specified pregnancy related conditions, first trimester: Secondary | ICD-10-CM | POA: Insufficient documentation

## 2021-08-27 DIAGNOSIS — O99611 Diseases of the digestive system complicating pregnancy, first trimester: Secondary | ICD-10-CM | POA: Insufficient documentation

## 2021-08-27 DIAGNOSIS — D649 Anemia, unspecified: Secondary | ICD-10-CM | POA: Diagnosis not present

## 2021-08-27 DIAGNOSIS — O99011 Anemia complicating pregnancy, first trimester: Secondary | ICD-10-CM | POA: Diagnosis not present

## 2021-08-27 DIAGNOSIS — K5900 Constipation, unspecified: Secondary | ICD-10-CM | POA: Insufficient documentation

## 2021-08-27 DIAGNOSIS — R102 Pelvic and perineal pain: Secondary | ICD-10-CM | POA: Insufficient documentation

## 2021-08-27 DIAGNOSIS — Z3491 Encounter for supervision of normal pregnancy, unspecified, first trimester: Secondary | ICD-10-CM | POA: Diagnosis not present

## 2021-08-27 DIAGNOSIS — R109 Unspecified abdominal pain: Secondary | ICD-10-CM | POA: Insufficient documentation

## 2021-08-27 DIAGNOSIS — Z3A13 13 weeks gestation of pregnancy: Secondary | ICD-10-CM | POA: Insufficient documentation

## 2021-08-27 DIAGNOSIS — N949 Unspecified condition associated with female genital organs and menstrual cycle: Secondary | ICD-10-CM

## 2021-08-27 LAB — URINALYSIS, ROUTINE W REFLEX MICROSCOPIC
Bilirubin Urine: NEGATIVE
Glucose, UA: NEGATIVE mg/dL
Hgb urine dipstick: NEGATIVE
Ketones, ur: NEGATIVE mg/dL
Leukocytes,Ua: NEGATIVE
Nitrite: NEGATIVE
Protein, ur: NEGATIVE mg/dL
Specific Gravity, Urine: 1.012 (ref 1.005–1.030)
pH: 5 (ref 5.0–8.0)

## 2021-08-27 LAB — WET PREP, GENITAL
Clue Cells Wet Prep HPF POC: NONE SEEN
Sperm: NONE SEEN
Trich, Wet Prep: NONE SEEN
WBC, Wet Prep HPF POC: <10 (ref <10)
Yeast Wet Prep HPF POC: NONE SEEN

## 2021-08-27 LAB — CBC
HCT: 30.9 % — ABNORMAL LOW (ref 36.0–46.0)
Hemoglobin: 9.9 g/dL — ABNORMAL LOW (ref 12.0–15.0)
MCH: 28.6 pg (ref 26.0–34.0)
MCHC: 32 g/dL (ref 30.0–36.0)
MCV: 89.3 fL (ref 80.0–100.0)
Platelets: 311 10*3/uL (ref 150–400)
RBC: 3.46 MIL/uL — ABNORMAL LOW (ref 3.87–5.11)
RDW: 13.5 % (ref 11.5–15.5)
WBC: 9.1 10*3/uL (ref 4.0–10.5)
nRBC: 0 % (ref 0.0–0.2)

## 2021-08-27 LAB — RPR: RPR Ser Ql: NONREACTIVE

## 2021-08-27 LAB — RAPID HIV SCREEN (HIV 1/2 AB+AG)
HIV 1/2 Antibodies: NONREACTIVE
HIV-1 P24 Antigen - HIV24: NONREACTIVE

## 2021-08-27 LAB — HEPATITIS B SURFACE ANTIGEN: Hepatitis B Surface Ag: NONREACTIVE

## 2021-08-27 NOTE — MAU Note (Signed)
Melissa Guzman is a 25 y.o. at [redacted]w[redacted]d here in MAU reporting: started working this morning and noticed a lower abdominal pain. States it was getting worse. Pain is intermittent. Is having white discharge.   Onset of complaint: today  Pain score: 8/10  Vitals:   08/27/21 0838  BP: 115/68  Pulse: (!) 117  Resp: 16  Temp: 98.4 F (36.9 C)  SpO2: 98%     FHT:164  Lab orders placed from triage: UA

## 2021-08-27 NOTE — MAU Provider Note (Addendum)
History     CSN: [id number]  Arrival date and time: 08/27/21 [id number]   Event Date/Time   First Provider Initiated Contact with Patient 08/27/21 [phone]      Chief Complaint  Patient presents with   Abdominal Pain   25 y.o. G1 @13 .1 wks presenting with LAP. Pain started this am. Pain is constant, bilateral and central. 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 Rates 5/10. Has not treated. Endorses dark red vaginal discharge with malodor at times, denies itching. Reports new sexual partner, would like STD testing. Denies urinary sx. No fevers. Reports constipation since pregnancywith recent small BM.   OB History     Gravida  1   Para  0   Term  0   Preterm  0   AB  0   Living  0      SAB  0   IAB  0   Ectopic  0   Multiple  0   Live Births  0           History reviewed. No pertinent past medical history.  History reviewed. No pertinent surgical history.  History reviewed. No pertinent family history.  Social History   Tobacco Use   Smoking status: Never   Smokeless tobacco: Never  Vaping Use   Vaping Use: Never used  Substance Use Topics   Alcohol use: Not Currently   Drug use: No    Allergies: No Known Allergies  Medications Prior to Admission  Medication Sig Dispense Refill Last Dose   Prenatal MV & Min w/FA-DHA (PRENATAL ADULT GUMMY/DHA/FA) 0.4-25 MG CHEW Chew 1 each by mouth daily. 30 tablet 11    Prenatal Vit-Fe Fumarate-FA (PREPLUS) 27-1 MG TABS Take 1 tablet by mouth daily.       Review of Systems  Gastrointestinal:  Positive for abdominal pain and constipation. Negative for nausea and vomiting.  Genitourinary:  Positive for vaginal discharge. Negative for dysuria, frequency, hematuria and vaginal bleeding.  Physical Exam   Blood pressure 115/68, pulse (!) 117, temperature 98.4 F (36.9 C), temperature source Oral, resp. rate 16, height 5\' 2"  (1.575 m), weight 64.3 kg, last menstrual period 05/27/2021, SpO2 98  %.  Physical Exam Vitals and nursing note reviewed. Exam conducted with a chaperone present.  Constitutional:      General: She is not in acute distress.    Appearance: Normal appearance.  HENT:     Head: Normocephalic and atraumatic.  Cardiovascular:     Rate and Rhythm: Normal rate.  Pulmonary:     Effort: Pulmonary effort is normal. No respiratory distress.  Abdominal:     General: There is no distension.     Palpations: Abdomen is soft. There is no mass.     Tenderness: There is no abdominal tenderness. There is no guarding or rebound.     Hernia: No hernia is present.  Genitourinary:    Comments: External: no lesions or erythema Vagina: rugated, pink, moist, scant clear discharge Cervix closed/long   Musculoskeletal:        General: Normal range of motion.     Cervical back: Normal range of motion.  Skin:    General: Skin is warm and dry.  Neurological:     General: No focal deficit present.     Mental Status: She is alert and oriented to person, place, and time.  Psychiatric:        Mood and Affect: Mood normal.  Behavior: Behavior normal.  FHT 164  Results for orders placed or performed during the hospital encounter of 08/27/21 (from the past 24 hour(s))  Urinalysis, Routine w reflex microscopic Urine, Clean Catch     Status: None   Collection Time: 08/27/21  8:49 AM  Result Value Ref Range   Color, Urine YELLOW YELLOW   APPearance CLEAR CLEAR   Specific Gravity, Urine 1.012 1.005 - 1.030   pH 5.0 5.0 - 8.0   Glucose, UA NEGATIVE NEGATIVE mg/dL   Hgb urine dipstick NEGATIVE NEGATIVE   Bilirubin Urine NEGATIVE NEGATIVE   Ketones, ur NEGATIVE NEGATIVE mg/dL   Protein, ur NEGATIVE NEGATIVE mg/dL   Nitrite NEGATIVE NEGATIVE   Leukocytes,Ua NEGATIVE NEGATIVE  Wet prep, genital     Status: None   Collection Time: 08/27/21  9:02 AM   Specimen: PATH Cytology Cervicovaginal Ancillary Only  Result Value Ref Range   Yeast Wet Prep HPF POC NONE SEEN NONE SEEN    Trich, Wet Prep NONE SEEN NONE SEEN   Clue Cells Wet Prep HPF POC NONE SEEN NONE SEEN   WBC, Wet Prep HPF POC <10 <10   Sperm NONE SEEN   CBC     Status: Abnormal   Collection Time: 08/27/21  9:13 AM  Result Value Ref Range   WBC 9.1 4.0 - 10.5 K/uL   RBC 3.46 (L) 3.87 - 5.11 MIL/uL   Hemoglobin 9.9 (L) 12.0 - 15.0 g/dL   HCT 32.2 (L) 02.5 - 42.7 %   MCV 89.3 80.0 - 100.0 fL   MCH 28.6 26.0 - 34.0 pg   MCHC 32.0 30.0 - 36.0 g/dL   RDW 06.2 37.6 - 28.3 %   Platelets 311 150 - 400 K/uL   nRBC 0.0 0.0 - 0.2 %  Rapid HIV screen (HIV 1/2 Ab+Ag) ([facility] Only)     Status: None   Collection Time: 08/27/21  9:13 AM  Result Value Ref Range   HIV-1 P24 Antigen - HIV24 NON REACTIVE NON REACTIVE   HIV 1/2 Antibodies NON REACTIVE NON REACTIVE   Interpretation (HIV Ag Ab)      A non reactive test result means that HIV 1 or HIV 2 antibodies and HIV 1 p24 antigen were not detected in the specimen.  RPR     Status: None   Collection Time: 08/27/21  9:13 AM  Result Value Ref Range   RPR Ser Ql NON REACTIVE NON REACTIVE   MAU Course  Procedures  MDM Labs ordered and reviewed. Declines analgesic. Anemia noted. Pain likely MSK, discussed comfort measures. Stable for discharge home.   Assessment and Plan   1. Supervision of low-risk pregnancy, first trimester   2. [redacted] weeks gestation of pregnancy   3. Anemia during pregnancy in first trimester   4. Pain of round ligament    Discharge home Follow up at [facility] as scheduled SAB precautions Continue PNV w/Fe Tylenol prn  Allergies as of 08/27/2021   No Known Allergies      Medication List     STOP taking these medications    Prenatal Adult Gummy/DHA/FA 0.4-25 MG Chew       TAKE these medications    PrePLUS 27-1 MG Tabs Take 1 tablet by mouth daily.        Donette Larry, CNM 08/27/2021, 9:02 AM

## 2021-08-28 LAB — GC/CHLAMYDIA PROBE AMP (~~LOC~~) NOT AT ARMC
Chlamydia: NEGATIVE
Comment: NEGATIVE
Comment: NORMAL
Neisseria Gonorrhea: NEGATIVE

## 2021-09-05 ENCOUNTER — Encounter: Payer: Self-pay | Admitting: Family Medicine

## 2021-09-05 ENCOUNTER — Other Ambulatory Visit: Payer: Self-pay

## 2021-09-05 ENCOUNTER — Ambulatory Visit (INDEPENDENT_AMBULATORY_CARE_PROVIDER_SITE_OTHER): Payer: Medicaid Other | Admitting: Family Medicine

## 2021-09-05 VITALS — BP 103/70 | HR 98 | Wt 140.8 lb

## 2021-09-05 DIAGNOSIS — O99019 Anemia complicating pregnancy, unspecified trimester: Secondary | ICD-10-CM

## 2021-09-05 DIAGNOSIS — Z31430 Encounter of female for testing for genetic disease carrier status for procreative management: Secondary | ICD-10-CM | POA: Diagnosis not present

## 2021-09-05 DIAGNOSIS — Z3491 Encounter for supervision of normal pregnancy, unspecified, first trimester: Secondary | ICD-10-CM

## 2021-09-05 DIAGNOSIS — Z3482 Encounter for supervision of other normal pregnancy, second trimester: Secondary | ICD-10-CM | POA: Diagnosis not present

## 2021-09-05 NOTE — Patient Instructions (Signed)
Aspirin 81 mg po daily ordered for preeclampsia prevention    

## 2021-09-05 NOTE — Progress Notes (Signed)
INITIAL PRENATAL VISIT- Mom+Baby Combined Care  Subjective:   Melissa Guzman is being seen today for her first obstetrical visit.  She is at [redacted]w[redacted]d gestation by LMP She has not significant OB history or medical history.  FOB is not involved in pregnancy. Patient does intend to breast feed. Patient was considering and even scheduled a termination for this pregnancy but has decided to keep the pregnancy. This is related to her relationship with the FOB and this was not an intended pregnancy.  Pap was done at [facility]   Patient reports no complaints.  Indications for ASA therapy (per uptodate) One of the following: Previous pregnancy with preeclampsia, especially early onset and with an adverse outcome No Multifetal gestation No Chronic hypertension No Type 1 or 2 diabetes mellitus No Chronic kidney disease No Autoimmune disease (antiphospholipid syndrome, systemic lupus erythematosus) No  Two or more of the following: Nulliparity Yes Obesity (body mass index >30 kg/m2) No Family history of preeclampsia in mother or sister No Age ?35 years No Sociodemographic characteristics (African American race, low socioeconomic level) Yes Personal risk factors (eg, previous pregnancy with low birth weight or small for gestational age infant, previous adverse pregnancy outcome [eg, stillbirth], interval >10 years between pregnancies) No  Indications for early GDM screening  First-degree relative with diabetes No BMI >30kg/m2 No Age > 25 No Previous birth of an infant weighing ?4000 g No Gestational diabetes mellitus in a previous pregnancy No Glycated hemoglobin ?5.7 percent (39 mmol/mol), impaired glucose tolerance, or impaired fasting glucose on previous testing No High-risk race/ethnicity (eg, African American, Latino, Native American, Asian American, Pacific Islander) Yes Previous stillbirth of unknown cause No Maternal birthweight > 9 lbs No History of cardiovascular disease  No Hypertension or on therapy for hypertension No High-density lipoprotein cholesterol level <35 mg/dL (7.62 mmol/L) and/or a triglyceride level >250 mg/dL (8.31 mmol/L) No Polycystic ovary syndrome No Physical inactivity No Other clinical condition associated with insulin resistance (eg, severe obesity, acanthosis nigricans) No Current use of glucocorticoids No   Early screening tests: FBS, A1C, Random CBG, glucose challenge   Review of Systems:   Review of Systems  Objective:    Obstetric History OB History  Gravida Para Term Preterm AB Living  1 0 0 0 0 0  SAB IAB Ectopic Multiple Live Births  0 0 0 0 0    # Outcome Date GA Lbr Len/2nd Weight Sex Delivery Anes PTL Lv  1 Current             History reviewed. No pertinent past medical history.  History reviewed. No pertinent surgical history.  Current Outpatient Medications on File Prior to Visit  Medication Sig Dispense Refill   Prenatal Vit-Fe Fumarate-FA (PREPLUS) 27-1 MG TABS Take 1 tablet by mouth daily.     No current facility-administered medications on file prior to visit.    No Known Allergies  Social History:  reports that she has never smoked. She has never used smokeless tobacco. She reports that she does not currently use alcohol. She reports that she does not use drugs.  History reviewed. No pertinent family history.  The following portions of the patient's history were reviewed and updated as appropriate: allergies, current medications, past family history, past medical history, past social history, past surgical history and problem list.  Review of Systems Review of Systems  Constitutional:  Negative for chills and fever.  HENT:  Negative for congestion and sore throat.   Eyes:  Negative for  pain and visual disturbance.  Respiratory:  Negative for cough, chest tightness and shortness of breath.   Cardiovascular:  Negative for chest pain.  Gastrointestinal:  Negative for abdominal pain, diarrhea,  nausea and vomiting.  Endocrine: Negative for cold intolerance and heat intolerance.  Genitourinary:  Negative for dysuria and flank pain.  Musculoskeletal:  Negative for back pain.  Skin:  Negative for rash.  Allergic/Immunologic: Negative for food allergies.  Neurological:  Negative for dizziness and light-headedness.  Psychiatric/Behavioral:  Negative for agitation.      Physical Exam:  BP 103/70   Pulse 98   Wt 140 lb 12.8 oz (63.9 kg)   LMP 05/27/2021   BMI 25.75 kg/m   Fetal Status: Fetal Heart Rate (bpm): 166   Movement: Absent     CONSTITUTIONAL: Well-developed, well-nourished female in no acute distress.  HENT:  Normocephalic, atraumatic, External right and left ear normal. Oropharynx is clear and moist EYES: Conjunctivae normal. No scleral icterus.  NECK: Normal range of motion, supple, no masses.  Normal thyroid.  SKIN: Skin is warm and dry. No rash noted. Not diaphoretic. No erythema. No pallor. MUSCULOSKELETAL: Normal range of motion. No tenderness.  No cyanosis, clubbing, or edema.   NEUROLOGIC: Alert and oriented to person, place, and time. Normal muscle tone coordination.  PSYCHIATRIC: Normal mood and affect. Normal behavior. Normal judgment and thought content. CARDIOVASCULAR: Normal heart rate noted, regular rhythm RESPIRATORY: Clear to auscultation bilaterally. Effort and breath sounds normal, no problems with respiration noted. BREASTS: Symmetric in size. No masses, skin changes, nipple drainage, or lymphadenopathy. ABDOMEN: Soft, normal bowel sounds, no distention noted.  No tenderness, rebound or guarding.  PELVIC: not indicated    Assessment:    Pregnancy: G1P0000   1. Supervision of low-risk pregnancy, first trimester - CBC/D/Plt+RPR+Rh+ABO+RubIgG... - Genetic Screening - GC/Chlamydia probe amp ([place])not at [facility] - Culture, OB Urine - Hemoglobin A1c - Ambulatory referral to [facility] - Discussed that she has two minor risk  factors for preeclampsia and the recommendation for ASA therapy for prevention.  She is unsure at this point but will consider starting this.     Plan:     Initial labs drawn. Prenatal vitamins. Problem list reviewed and updated. Reviewed in detail the nature of the practice with collaborative care  Discussed Mom+Baby model with prenatal care and then infant will received care at our office. Reviewed providers involved in care.  Genetic screening discussed: NIPS/AFP requested. Role of ultrasound in pregnancy discussed; Anatomy US: requested. Amniocentesis discussed: not indicated. Follow up in 4 weeks. Discussed clinic routines, schedule of care and testing, genetic screening options, involvement of students and residents under the direct supervision of APPs and doctors and presence of female providers. Pt verbalized understanding.  Future Appointments  Date Time Provider [place]  10/08/2021 10:15 AM [facility] US2 [facility]  10/09/2021  3:55 PM [id number] [place]     Caren Macadam, MD 09/05/2021 10:10 PM

## 2021-09-06 ENCOUNTER — Encounter: Payer: Self-pay | Admitting: Family Medicine

## 2021-09-06 ENCOUNTER — Encounter: Payer: Self-pay | Admitting: *Deleted

## 2021-09-06 DIAGNOSIS — D509 Iron deficiency anemia, unspecified: Secondary | ICD-10-CM | POA: Insufficient documentation

## 2021-09-06 DIAGNOSIS — O99019 Anemia complicating pregnancy, unspecified trimester: Secondary | ICD-10-CM | POA: Insufficient documentation

## 2021-09-06 LAB — CBC/D/PLT+RPR+RH+ABO+RUBIGG...
Antibody Screen: NEGATIVE
Basophils Absolute: 0 10*3/uL (ref 0.0–0.2)
Basos: 0 %
EOS (ABSOLUTE): 0 10*3/uL (ref 0.0–0.4)
Eos: 0 %
HCV Ab: <0.1 s/co ratio (ref 0.0–0.9)
HIV Screen 4th Generation wRfx: NONREACTIVE
Hematocrit: 31.9 % — ABNORMAL LOW (ref 34.0–46.6)
Hemoglobin: 10.6 g/dL — ABNORMAL LOW (ref 11.1–15.9)
Hepatitis B Surface Ag: NEGATIVE
Immature Grans (Abs): 0 10*3/uL (ref 0.0–0.1)
Immature Granulocytes: 0 %
Lymphocytes Absolute: 2.2 10*3/uL (ref 0.7–3.1)
Lymphs: 25 %
MCH: 29.2 pg (ref 26.6–33.0)
MCHC: 33.2 g/dL (ref 31.5–35.7)
MCV: 88 fL (ref 79–97)
Monocytes Absolute: 0.5 10*3/uL (ref 0.1–0.9)
Monocytes: 6 %
Neutrophils Absolute: 6.3 10*3/uL (ref 1.4–7.0)
Neutrophils: 69 %
Platelets: 294 10*3/uL (ref 150–450)
RBC: 3.63 x10E6/uL — ABNORMAL LOW (ref 3.77–5.28)
RDW: 13.6 % (ref 11.7–15.4)
RPR Ser Ql: NONREACTIVE
Rh Factor: POSITIVE
Rubella Antibodies, IGG: 8.43 index (ref >0.99)
WBC: 9 10*3/uL (ref 3.4–10.8)

## 2021-09-06 LAB — HCV INTERPRETATION

## 2021-09-06 LAB — HEMOGLOBIN A1C
Est. average glucose Bld gHb Est-mCnc: 94 mg/dL
Hgb A1c MFr Bld: 4.9 % (ref 4.8–5.6)

## 2021-09-06 MED ORDER — FERROUS SULFATE 325 (65 FE) MG PO TABS: 325.0000 mg | ORAL_TABLET | ORAL | 1 refills | Status: DC

## 2021-09-06 NOTE — Addendum Note (Signed)
Addended by: Geanie Berlin on: 09/06/2021 12:41 PM   Modules accepted: Orders

## 2021-09-07 LAB — URINE CULTURE, OB REFLEX

## 2021-09-07 LAB — CULTURE, OB URINE

## 2021-09-09 ENCOUNTER — Telehealth: Payer: Self-pay

## 2021-09-09 NOTE — Telephone Encounter (Addendum)
-----   Message from Federico Flake, MD sent at 09/06/2021 12:40 PM EST ----- Mild anemia-- recommended Fe supplement and sent to pharmacy.   Notified pt of results and that iron tablet has been sent to her [employer] on [address] and [address].  Pt verbalized understanding.  Addison Naegeli, RN  09/09/21

## 2021-09-11 ENCOUNTER — Telehealth: Payer: Self-pay | Admitting: General Practice

## 2021-09-11 NOTE — Telephone Encounter (Signed)
Patient called into front office requesting her gender test results as she doesn't see them in [place].   Called patient and explained those results are not back yet & can take up to two weeks to finalize. Discussed registering kit through [employer] as that will be a faster way to get results rather than through mychart. Provided instructions on where to find in mychart & how to register kit with [employer]. Patient verbalized understanding.

## 2021-09-11 NOTE — BH Specialist Note (Signed)
Integrated Behavioral Health via Telemedicine Visit  09/11/2021 Melissa Guzman [id number]  Number of Integrated Behavioral Health visits: 1 Session Start time: 3:56  Session End time: 4:31 Total time:  35  Referring Provider: Federico Flake, MD Patient/Family location: Home [facility] Provider location: [facility]  All persons participating in visit: [name] and Melissa Guzman Melissa Guzman   Types of Service: Individual psychotherapy and Video visit  I connected with Melissa Guzman and/or Melissa Guzman's  n/a  via  Telephone or Video Enabled Telemedicine Application  (Video is Caregility application) and verified that I am speaking with the correct person using two identifiers. Discussed confidentiality: Yes   I discussed the limitations of telemedicine and the availability of in person appointments.  Discussed there is a possibility of technology failure and discussed alternative modes of communication if that failure occurs.  I discussed that engaging in this telemedicine visit, they consent to the provision of behavioral healthcare and the services will be billed under their insurance.  Patient and/or legal guardian expressed understanding and consented to Telemedicine visit: Yes   Presenting Concerns: Patient and/or family reports the following symptoms/concerns: Has come to terms with FOB not involved; worry (about labor and whether or not she will experience postpartum depression, as her mother experienced, obtaining childcare voucher, and finding her own housing after baby is born); concern about lifting heavy items at work causing current strained back with pain.  Duration of problem: Current pregnancy; Severity of problem: moderate  Patient and/or Family's Strengths/Protective Factors: Social connections, Concrete supports in place (healthy food, safe environments, etc.), and Sense of purpose  Goals Addressed: Patient will:   Reduce symptoms of: anxiety   Increase knowledge and/or ability of: healthy habits and stress reduction   Demonstrate ability to: Increase healthy adjustment to current life circumstances  Progress towards Goals: Ongoing  Interventions: Interventions utilized:  Solution-Focused Strategies, Psychoeducation and/or Health Education, and Link to [employer] Assessments completed: Not Needed  Patient and/or Family Response: Pt agrees with treatment plan  Assessment: 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   Patient may benefit from psychoeducation and brief therapeutic interventions regarding coping with symptoms of anxiety .  Plan: Follow up with behavioral health clinician on : Two months; Call Paz Winsett at [phone], as needed Behavioral recommendations:  -Continue taking prenatal vitamin; pick up iron at pharmacy today and begin taking as prescribed -View website [email or web address] to see childbirth class options available -Read through Postpartum Planner on After Visit Summary -Consider community resources on After Visit Summary; use as needed Referral(s): Integrated Art gallery manager (In Clinic) and [employer] Resources:  Housing and Childcare  I discussed the assessment and treatment plan with the patient and/or parent/guardian. They were provided an opportunity to ask questions and all were answered. They agreed with the plan and demonstrated an understanding of the instructions.   They were advised to call back or seek an in-person evaluation if the symptoms worsen or if the condition fails to improve as anticipated.  Rae Lips, LCSW  Depression screen [facility] 2/9 09/05/2021 08/01/2021  Decreased Interest 1 2  Down, Depressed, Hopeless 1 0  PHQ - 2 Score 2 2  Altered sleeping 3 1  Tired, decreased energy 2 1  Change in appetite 0 1  Feeling bad or failure about yourself  1 0  Trouble concentrating 0 0   Moving slowly or fidgety/restless 1 0  Suicidal thoughts 0 0  PHQ-9 Score  9 5  Difficult doing work/chores Not difficult at all -   GAD 7 : Generalized Anxiety Score 09/05/2021 08/01/2021  Nervous, Anxious, on Edge 1 1  Control/stop worrying 3 0  Worry too much - different things 1 1  Trouble relaxing 1 0  Restless 0 0  Easily annoyed or irritable 0 0  Afraid - awful might happen 3 0  Total GAD 7 Score 9 2  Anxiety Difficulty Not difficult at all -

## 2021-09-16 ENCOUNTER — Encounter: Payer: Self-pay | Admitting: Family Medicine

## 2021-09-16 DIAGNOSIS — N760 Acute vaginitis: Secondary | ICD-10-CM

## 2021-09-16 MED ORDER — METRONIDAZOLE 500 MG PO TABS: 500.0000 mg | ORAL_TABLET | Freq: Two times a day (BID) | ORAL | 0 refills | Status: DC

## 2021-09-19 ENCOUNTER — Telehealth: Payer: Self-pay | Admitting: General Practice

## 2021-09-19 NOTE — Telephone Encounter (Signed)
Patient called into front office stating she is having a hard time finding her gender results. Walked patient through where to find results and she was able to access them. Patient had no other questions.

## 2021-09-25 ENCOUNTER — Telehealth: Payer: Self-pay | Admitting: Lactation Services

## 2021-09-25 ENCOUNTER — Ambulatory Visit (INDEPENDENT_AMBULATORY_CARE_PROVIDER_SITE_OTHER): Payer: 59 | Admitting: Clinical

## 2021-09-25 ENCOUNTER — Other Ambulatory Visit: Payer: Self-pay

## 2021-09-25 ENCOUNTER — Telehealth: Payer: Self-pay | Admitting: General Practice

## 2021-09-25 DIAGNOSIS — F4322 Adjustment disorder with anxiety: Secondary | ICD-10-CM | POA: Diagnosis not present

## 2021-09-25 NOTE — Patient Instructions (Addendum)
Center for [facility] Healthcare at [facility] [place], [place] [address] [phone] (main office) [phone] [facility] office)  [email or web address]    [place] Mult[employer] programmer  (Childcare options, Early childcare development, etc.) [email or web address]      BRAINSTORMING  Develop a Plan Goals: Provide a way to start conversation about your new life with a baby Assist parents in recognizing and using resources within their reach Help pave the way before birth for an easier period of transition afterwards.  Make a list of the following information to keep in a central location: Full name of Mom and Partner: _____________________________________________ 27 full name and Date of Birth: ___________________________________________ Home Address: ___________________________________________________________ ________________________________________________________________________ Home Phone: ____________________________________________________________ Parents' cell numbers: _____________________________________________________ ________________________________________________________________________ Name and contact info for OB: ______________________________________________ Name and contact info for Pediatrician:________________________________________ Contact info for Lactation Consultants: ________________________________________  REST and SLEEP *You each need at least 4-5 hours of uninterrupted sleep every day. Write specific names and contact information.* How are you going to rest in the postpartum period? While partner's home? When partner returns to work? When you both return to work? Where will your baby sleep? Who is available to help during the day? Evening? Night? Who could move in for a period to help support you? What are some ideas to help you get enough  sleep? __________________________________________________________________________________________________________________________________________________________________________________________________________________________________________ NUTRITIOUS FOOD AND DRINK *Plan for meals before your baby is born so you can have healthy food to eat during the immediate postpartum period.* Who will look after breakfast? Lunch? Dinner? List names and contact information. Brainstorm quick, healthy ideas for each meal. What can you do before baby is born to prepare meals for the postpartum period? How can others help you with meals? Which grocery stores provide online shopping and delivery? Which restaurants offer take-out or delivery options? ______________________________________________________________________________________________________________________________________________________________________________________________________________________________________________________________________________________________________________________________________________________________________________________________________  CARE FOR MOM *It's important that mom is cared for and pampered in the postpartum period. Remember, the most important ways new mothers need care are: sleep, nutrition, gentle exercise, and time off.* Who can come take care of mom during this period? Make a list of people with their contact information. List some activities that make you feel cared for, rested, and energized? Who can make sure you have opportunities to do these things? Does mom have a space of her very own within your home that's just for her? Make a [facility] where she can be comfortable, rest, and renew herself  daily. ______________________________________________________________________________________________________________________________________________________________________________________________________________________________________________________________________________________________________________________________________________________________________________________________________    CARE FOR AND FEEDING BABY *Knowledgeable and encouraging people will offer the best support with regard to feeding your baby.* Educate yourself and choose the best feeding option for your baby. Make a list of people who will guide, support, and be a resource for you as your care for and feed your baby. (Friends that have breastfed or are currently breastfeeding, lactation consultants, breastfeeding support groups, etc.) Consider a postpartum doula. (These websites can give you information: dona.org & BuyingShow.es) Seek out local breastfeeding resources like the breastfeeding support group at [employer] or [employer]. ______________________________________________________________________________________________________________________________________________________________________________________________________________________________________________________________________________________________________________________________________________________________________________________________________  Verner Chol AND ERRANDS Who can help with a thorough cleaning before baby is born? Make a list of people who will help with housekeeping and chores, like laundry, light cleaning, dishes, bathrooms, etc. Who can run some errands for you? What can you do to make sure you are stocked with basic supplies before baby is born? Who is going to do the  shopping? ______________________________________________________________________________________________________________________________________________________________________________________________________________________________________________________________________________________________________________________________________________________________________________________________________     Family Adjustment *Nurture yourselvesit helps parents be more loving  and allows for better bonding with their child.* What sorts of things do you and partner enjoy doing together? Which activities help you to connect and strengthen your relationship? Make a list of those things. Make a list of people whom you trust to care for your baby so you can have some time together as a couple. What types of things help partner feel connected to Mom? Make a list. What needs will partner have in order to bond with baby? Other children? Who will care for them when you go into labor and while you are in the hospital? Think about what the needs of your older children might be. Who can help you meet those needs? In what ways are you helping them prepare for bringing baby home? List some specific strategies you have for family adjustment. _______________________________________________________________________________________________________________________________________________________________________________________________________________________________________________________________________________________________________________________________________________  SUPPORT *Someone who can empathize with experiences normalizes your problems and makes them more bearable.* Make a list of other friends, neighbors, and/or co-workers you know with infants (and small children, if applicable) with whom you can connect. Make a list of local or online support groups, mom groups, etc. in which you can be  involved. ______________________________________________________________________________________________________________________________________________________________________________________________________________________________________________________________________________________________________________________________________________________________________________________________________  Childcare Plans Investigate and plan for childcare if mom is returning to work. Talk about mom's concerns about her transition back to work. Talk about partner's concerns regarding this transition.  Mental Health *Your mental health is one of the highest priorities for a pregnant or postpartum mom.* 1 in 5 women experience anxiety and/or depression from the time of conception through the first year after birth. Postpartum Mood Disorders are the #1 complication of pregnancy and childbirth and the suffering experienced by these mothers is not necessary! These illnesses are temporary and respond well to treatment, which often includes self-care, social support, talk therapy, and medication when needed. Women experiencing anxiety and depression often say things like: I'm supposed to be happywhy do I feel so sad?, Why can't I snap out of it?, I'm having thoughts that scare me. There is no need to be embarrassed if you are feeling these symptoms: Overwhelmed, anxious, angry, sad, guilty, irritable, hopeless, exhausted but can't sleep You are NOT alone. You are NOT to blame. With help, you WILL be well. Where can I find help? Medical professionals such as your OB, midwife, gynecologist, family practitioner, primary care provider, pediatrician, or mental health providers; [facility] support groups: Feelings After Birth, Breastfeeding Support Group, Baby and Me Group, and Fit 4 Two exercise classes. You have permission to ask for help. It will confirm your feelings, validate your experiences,  share/learn coping strategies, and gain support and encouragement as you heal. You are important! BRAINSTORM Make a list of local resources, including resources for mom and for partner. Identify support groups. Identify people to call late at night - include names and contact info. Talk with partner about perinatal mood and anxiety disorders. Talk with your OB, midwife, and doula about baby blues and about perinatal mood and anxiety disorders. Talk with your pediatrician about perinatal mood and anxiety disorders.   Support & Sanity Savers   What do you really need?  Basics In preparing for a new baby, many expectant parents spend hours shopping for baby clothes, decorating the nursery, and deciding which car seat to buy. Yet most don't think much about what the reality of parenting a newborn will be like, and what they need to make it through that. So, here is the advice of experienced parents. We know you'll read this, and think  they're exaggerating, I don't really need that. Just trust [place] on these, OK? Plan for all of this, and if it turns out you don't need it, come back and teach [place] how you did it!  Must-Haves (Once baby's survival needs are met, make sure you attend to your own survival needs!) Sleep An average newborn sleeps 16-18 hours per day, over 6-7 sleep periods, rarely more than three hours at a time. It is normal and healthy for a newborn to wake throughout the night... but really hard on parents!! Naps. Prioritize sleep above any responsibilities like: cleaning house, visiting friends, running errands, etc.  Sleep whenever baby sleeps. If you can't nap, at least have restful times when baby eats. The more rest you get, the more patient you will be, the more emotionally stable, and better at solving problems.  Food You may not have realized it would be difficult to eat when you have a newborn. Yet, when we talk to countless new parents, they say things like it may be 2:00 pm  when I realize I haven't had breakfast yet. Or every time we sit down to dinner, baby needs to eat, and my food gets cold, so I don't bother to eat it. Finger food. Before your baby is born, stock up with one months' worth of food that: 1) you can eat with one hand while holding a baby, 2) doesn't need to be prepped, 3) is good hot or cold, 4) doesn't spoil when left out for a few hours, and 5) you like to eat. Think about: nuts, dried fruit, Clif bars, pretzels, jerky, gogurt, baby carrots, apples, bananas, crackers, cheez-n-crackers, string cheese, hot pockets or frozen burritos to microwave, garden burgers and breakfast pastries to put in the toaster, yogurt drinks, etc. Restaurant Menus. Make lists of your favorite restaurants & menu items. When family/friends want to help, you can give specific information without much thought. They can either bring you the food or send gift cards for just the right meals. Freezer Meals.  Take some time to make a few meals to put in the freezer ahead of time.  Easy to freeze meals can be anything such as soup, lasagna, chicken pie, or spaghetti sauce. Set up a Meal Schedule.  Ask friends and family to sign up to bring you meals during the first few weeks of being home. (It can be passed around at baby showers!) You have no idea how helpful this will be until you are in the throes of parenting.  https://hamilton-[email or web address]/ is a great website to check out. Emotional Support Know who to call when you're stressed out. Parenting a newborn is very challenging work. There are times when it totally overwhelms your normal coping abilities. EVERY NEW PARENT NEEDS TO HAVE A PLAN FOR WHO TO CALL WHEN THEY JUST CAN'T COPE ANY MORE. (And it has to be someone other than the baby's other parent!) Before your baby is born, come up with at least one person you can call for support - write their phone number down and post it on the refrigerator. Anxiety & Sadness. Baby blues are normal after  pregnancy; however, there are more severe types of anxiety & sadness which can occur and should not be ignored.  They are always treatable, but you have to take the first step by reaching out for help. [facility] offers a Mom Talk group which meets every Tuesday from 10 am - 11 am.  This group is for new moms who need support  and connection after their babies are born.  Call [phone].  Really, Really Helpful (Plan for them! Make sure these happen often!!) Physical Support with Taking Care of Yourselves Asking friends and family. Before your baby is born, set up a schedule of people who can come and visit and help out (or ask a friend to schedule for you). Any time someone says let me know what I can do to help, sign them up for a day. When they get there, their job is not to take care of the baby (that's your job and your joy). Their job is to take care of you!  Postpartum doulas. If you don't have anyone you can call on for support, look into postpartum doulas:  professionals at helping parents with caring for baby, caring for themselves, getting breastfeeding started, and helping with household tasks. www.padanc.org is a helpful website for learning about doulas in our area. Peer Support / Parent Groups Why: One of the greatest ideas for new parents is to be around other new parents. Parent groups give you a chance to share and listen to others who are going through the same season of life, get a sense of what is normal infant development by watching several babies learn and grow, share your stories of triumph and struggles with empathetic ears, and forgive your own mistakes when you realize all parents are learning by trial and error. Where to find: There are many places you can meet other new parents throughout our community.  [facility] offers the following classes for new moms and their little ones:  Baby and Me (Birth to [place]) and Breastfeeding Support Group. Go to  www.[email or web address] or call [phone] for more information. Time for your Relationship It's easy to get so caught up in meeting baby's immediate needs that it's hard to find time to connect with your partner, and meet the needs of your relationship. It's also easy to forget what quality time with your partner actually looks like. If you take your baby on a date, you'd be amazed how much of your couple time is spent feeding the baby, diapering the baby, admiring the baby, and talking about the baby. Dating: Try to take time for just the two of you. Babysitter tip: Sometimes when moms are breastfeeding a newborn, they find it hard to figure out how to schedule outings around baby's unpredictable feeding schedules. Have the babysitter come for a three hour period. When she comes over, if baby has just eaten, you can leave right away, and come back in two hours. If baby hasn't fed recently, you start the date at home. Once baby gets hungry and gets a good feeding in, you can head out for the rest of your date time. Date Nights at Home: If you can't get out, at least set aside one evening a week to prioritize your relationship: whenever baby dozes off or doesn't have any immediate needs, spend a little time focusing on each other. Potential conflicts: The main relationship conflicts that come up for new parents are: issues related to sexuality, financial stresses, a feeling of an unfair division of household tasks, and conflicts in parenting styles. The more you can work on these issues before baby arrives, the better!  Fun and Frills (Don't forget these and don't feel guilty for indulging in them!) Everyone has something in life that is a fun little treat that they do just for themselves. It may be: reading the morning paper, or going for a daily jog,  or having coffee with a friend once a week, or going to a movie on Friday nights, or fine chocolates, or bubble baths, or curling up with a good  book. Unless you do fun things for yourself every now and then, it's hard to have the energy for fun with your baby. Whatever your special treats are, make sure you find a way to continue to indulge in them after your baby is born. These special moments can recharge you, and allow you to return to baby with a new joy   PERINATAL MOOD DISORDERS: [place]   _________________________________________Emergency and Crisis Resources If you are an imminent risk to self or others, are experiencing intense personal distress, and/or have noticed significant changes in activities of daily living, call:  [place]: [phone]  [address] [place], [place], [address] Mobile Crisis: [place]: [phone] Or visit the following crisis centers: Local Emergency Departments [facility]: [address], [place]. Hours: 8:30AM-5PM. Insurance Accepted: Medicaid, Medicare, and Uninsured.  [facility]:  [address] [place]  Mon-Friday 8am-3pm, [phone]                                                                                  ___________ Non-Crisis Resources To identify specific providers that are covered by your insurance, contact your insurance company or local agencies:  [place]: [phone] [facility] and [employer]: [place]: [phone] Postpartum Support International- Warm-line: 951-874-7017                                                      __Outpatient Therapy and Medication Management   Providers:  [facility]: [phone] Hours: 9AM-5PM  Insurance Accepted: Alben Spittle, Shane Crutch, [place], [place] Total Access Care [facility] of Care): [phone] Hours: 8AM-5:30PM  nsurance Accepted: All insurances EXCEPT AARP, [place],  [place], and [place]: [phone] Hours: 8AM-8PM Insurance Accepted: Cristal Ford, Freddrick March, [place], Medicare, Donah Driver [phone] [facility]: [phone] Hours: 8:30AM-7PM Insurance Accepted: Cristal Ford, Medicaid, Medicare, Tricare, [employer] Counseling:  [place] Accepted:  Holland Falling, Lorella Nimrod, [employer], [place]: [phone] Hours: 9AM-5:30PM Insurance Accepted: Alben Spittle, Charlotte Crumb, and Medicaid, Medicare, [facility]:  [phone] Hours: 9am-5pm Insurance Accepted: BCBS; they do not accept Medicaid/Medicare [facility] [place]: [phone] Hours: 9am-9pm Insurance Accepted: All major insurance including Medicaid and Medicare [facility]: [phone] Hours: [place] Accepted: All insurances EXCEPT Medicaid and Medicare. [facility]: [phone]   ____________  Parenting [place]: [phone] [place]:  [place]: (support for children in the NICU and/or with special needs), [phone]   ___________                                                                 Mental Health Support Groups Mental Health Association: [phone]    _____________                                                                                  Online Resources Postpartum Support International: http://jones-berg.com/  800-944-4PPD 2Moms Supporting Moms:  www.momssupportingmoms.net   Housing Resources                    [employer] (serves [place], [place], [place], [place], [place], [place], [place], [place], [place], [place], [place], [place], and [place]  counties) [address] [place], [place] [address] [phone] http://dawson-[email or web address]  **Rental assistance, Home Rehabilitation,Weatherization Assistance Program, Forensic psychologist, Housing Voucher Program  [employer] for Housing and [employer] Studies: Chief Financial Officer Resources to residents of [place], [place], and [place] Make sure you have your documents ready, including:  (Household income verification: 2 months pay stubs, unemployment/social security award letter, statement of no income for all household members over 40) Photo ID for all household members over 18 Utility Bill/Rent Ledger/Lease: must show past due amount for utilities/rent, or the rental agreement if rent is current 2. Start your application online or by paper (in [place] or [place]) at:     http://boyd-evans.org/  3. Once you have completed the online application, you will get an email confirmation message from the county. Expect to hear back by phone or email at least 6-10 weeks from submitting your application.  4. While you wait:  Call [phone] to check in on your application Let your landlord know that you've applied. Your landlord will be asked to submit documents (W-9) during this application process. Payments will be made directly to the landlord/property [place] or utility assistance for [employer], [place], and [place] at [email or web address]/[email or web address] Questions? Call or email Renee at [phone] or [email or web address]   Eviction Mediation Program: The HOPE Program Https://www.rebuild.http://mills-[email or web address]/ HOPE Progam serves low-income renters in [place] counties, defined as less than or equal to 80% of the area median income for the county where the renter lives. In the following 12 counties, you should apply to your local rent and  utility assistance program INSTEAD OF the HOPE Program: [place], [place], [place], [place], [place], [place], [place], [place], [place], [place], [place]  If you live outside of [place], contact [place] call center at [phone] to talk to a Program Representative Monday-Friday, 8am-5pm Note that Native American tribes also received federal funding for rent and utility assistance programs. Recognized members of the following tribes will be served by programs managed by tribal governments, including:  [place] Indians, [place], [place], [place] of [place] and [place], [place], [phone] [email or web address] .[employer], [place], [phone] [email or web address]_[email or web address] .[place] [address], [place], [place] [address] [phone] www.gha-[place].[facility] [address] Lin Landsman [place], [place] [address] [phone] https://manning.[email or web address]/ **Programs include: Engineer, building services and Housing Counseling, Healthy Doctor, general practice, Homeless Prevention and [place] [address] [place], [place], [place] [address] [phone] www.https://www.farmer-stevens.info/ **housing applications/recertification; tax payment relief/exemption under specific qualifications  [facility] [address], [place], [place] [address] www.[email or web address] **transitional housing for women in recovery who have minor children or are pregnant  [place], [place], [place] [address] ArtistMovie.se  **emergency shelter and support services for families facing homelessness  [place] [address], [place], [place] [address] [phone] www.youthfocus.org **transitional housing to pregnant women;  emergency housing for youth who have run away, are experiencing a family crisis, are victims of abuse or neglect, or are homeless  [facility] [address], [place], [place] [address] [phone] ircgso.org **Drop-in center for people experiencing homelessness; overnight warming center when temperature is 25 degrees or below  [employer] [place], [place], [place] [address] [phone] https://reentrystaffingagency.org/ **help with affordable housing to people experiencing homelessness or unemployment due to incarceration  [facility] [address], [place], [place] [address] [phone] www.greensborourbanministry.org  **emergency and transitional housing, rent/mortgage assistance, utility assistance  [facility] [address], [place], [place] [address] [phone] www.salvationarmyof[place]reens[place].org **emergency and transitional housing  [employer] [place], [place], [place] [address] [phone] Www.habitatgreensboro.[place], [place], [place] [address] [phone] https://chshousing.org **[place] and [facility]  [employer] [facility] [address] [place] [address], [place], [place] [address] [phone] arrivance.com **home buyer education courses, foreclosure prevention  [facility] [address] [place], [place] [address] [phone] WirelessNovelties.no **Environmental Exposure Assessment (investigation of homes where either children or pregnant women with a confirmed elevated blood lead level reside)  [facility] [place], [place], [place] [address] [phone] http://www.perez.com/ **Home Expense  Assistance/Repairs Program; offers home accessibility updates, such as ramps or bars in the bathroom  [facility] [address], [place], [place] [address] [phone] https://www.self-help.org/locations/-branch **Offers credit-building and banking services to people unable to use [place] of [facility] [place], [place], [place] [address] [phone] RoulettePays.[email or web address]   [facility] [address] [place], [place] [address] [phone]  ClubMonetize.fr **housing applications/recertification, emergency and transitional housing  [employer] [facility] [employer] [address], [place], [place] [address] [phone] www.odm-hp.org  **emergency and permanent housing; rent/mortgage payment assistance  [employer] for [employer], [place] and [employer] [address], [place], [place] [address] [phone] ArchitectReviews.com.au  [facility] [place], [employer] [place], [place], [place] [address] www.familyservice-piedmont.org **emergency shelter for victims of domestic violence and sexual assault  [facility] [address] [place], [place] [address] [phone] www.senior-resources-[place].org **Home expense assistance/repairs for older [place] of [place] [address] [place], [place] [address] [phone] NameDisc.is  **May  offer help with minor housing repairs  [facility] [address] [place], [place] [address] [phone] **emergency housing for victims of domestic violence  [facility] [place], [place], [place] [employer])  [facility] [address], [place], [place] [address] [phone] www.newrha.[facility] [address] [place], [place] [address] ([place] [address] [place], [place], [place] [address] [phone] www.co.[email or web address].us **Housing applications/recertification; tax payment relief/exemption w specific qualifications  [place] for [facility]eless [address] [place], [place] [address] [phone] Http://www.rchelpforhomeless.org/HOME_PAGE.html  HELP, [place] [address], [place], [place] [address] [phone] [place] [phone] Hours of Operation: Monday-Friday, 8:30am-5:00pm http://helpincorporated.org **Includes emergency housing for victims of domestic violence

## 2021-09-25 NOTE — Telephone Encounter (Signed)
Patient called into front office reporting low back pain and thinks she may have strained something. She wants to know what she can do for that. Patient denies difficulty voiding. Recommended heating pad on low on her back and taking 2 extra strength tylenol. Patient verbalized understanding.

## 2021-09-25 NOTE — Telephone Encounter (Signed)
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   Advised patient to call Johnsie Cancel at [phone] to schedule a telephone genetic counseling session to discuss results further.   Advised it is recommended that FOB be tested to see if he carries the same genes and can pick up saliva kit in the office at her next visit to take home to complete and mail back to [place].   Reviewed up coming appointments with patient at her request.

## 2021-09-26 ENCOUNTER — Telehealth: Payer: Self-pay | Admitting: Lactation Services

## 2021-09-26 ENCOUNTER — Other Ambulatory Visit: Payer: Self-pay | Admitting: Family Medicine

## 2021-09-26 ENCOUNTER — Encounter: Payer: Self-pay | Admitting: Family Medicine

## 2021-09-26 DIAGNOSIS — O99019 Anemia complicating pregnancy, unspecified trimester: Secondary | ICD-10-CM

## 2021-09-26 DIAGNOSIS — Z148 Genetic carrier of other disease: Secondary | ICD-10-CM | POA: Insufficient documentation

## 2021-09-26 DIAGNOSIS — D563 Thalassemia minor: Secondary | ICD-10-CM | POA: Insufficient documentation

## 2021-09-26 MED ORDER — FERROUS SULFATE 325 (65 FE) MG PO TABS: 325.0000 mg | ORAL_TABLET | ORAL | 4 refills | Status: DC

## 2021-09-26 NOTE — Telephone Encounter (Signed)
Called and spoke with patient. She reports she did get the Metronidazole and was informed that Fe has been sent to [facility] today. Patient voiced understanding.

## 2021-09-26 NOTE — Telephone Encounter (Signed)
-----   Message from Federico Flake, MD sent at 09/26/2021  2:54 PM EST ----- Regarding: Fe supplement I can see a message that Jeanetta sent the Fe supplement to a different pharmacy (not [facility]). Asher Muir did you confirm her "regular" pharmacy by any chance?  ----- Message ----- From: Peter Congo Sent: 09/25/2021   4:55 PM EST To: Federico Flake, MD  Pt needs iron pill prescription sent to her regular pharmacy; was sent to [facility], and she does not have a membership; pharmacy will not allow her to get.

## 2021-10-08 ENCOUNTER — Other Ambulatory Visit: Payer: Self-pay

## 2021-10-08 ENCOUNTER — Other Ambulatory Visit: Payer: Self-pay | Admitting: Family Medicine

## 2021-10-08 ENCOUNTER — Ambulatory Visit: Payer: 59 | Attending: Obstetrics and Gynecology

## 2021-10-08 ENCOUNTER — Ambulatory Visit: Payer: 59 | Attending: Family Medicine

## 2021-10-08 DIAGNOSIS — O35EXX0 Maternal care for other (suspected) fetal abnormality and damage, fetal genitourinary anomalies, not applicable or unspecified: Secondary | ICD-10-CM | POA: Diagnosis not present

## 2021-10-08 DIAGNOSIS — Z3A19 19 weeks gestation of pregnancy: Secondary | ICD-10-CM | POA: Diagnosis not present

## 2021-10-08 DIAGNOSIS — Z363 Encounter for antenatal screening for malformations: Secondary | ICD-10-CM | POA: Insufficient documentation

## 2021-10-08 DIAGNOSIS — D563 Thalassemia minor: Secondary | ICD-10-CM | POA: Diagnosis not present

## 2021-10-08 DIAGNOSIS — Z3491 Encounter for supervision of normal pregnancy, unspecified, first trimester: Secondary | ICD-10-CM

## 2021-10-08 DIAGNOSIS — Z148 Genetic carrier of other disease: Secondary | ICD-10-CM | POA: Insufficient documentation

## 2021-10-08 DIAGNOSIS — Z3492 Encounter for supervision of normal pregnancy, unspecified, second trimester: Secondary | ICD-10-CM | POA: Insufficient documentation
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
## 2021-10-08 NOTE — Progress Notes (Addendum)
Name: Melissa Guzman Indication: Discuss carrier screening results  DOB: 09/28/1996 Age: 26 y.o.   EDC: 03/03/2022 LMP: 05/27/2021 Referring Office:  [facility]  EGA: [redacted]w[redacted]d Genetic Counselor: Teena Dunk, MS, CGC  OB Hx: G1P0 Date of Appointment: 10/08/2021  Accompanied by: Unaccompanied Face to Face Time: 30 Minutes   Previous Testing Completed: Melissa Guzman previously completed Non-Invasive Prenatal Screening (NIPS) in this pregnancy (scanned into Epic under the Media tab). The result is low risk, consistent with a female fetus. This screening significantly reduces the risk that the current pregnancy has Down syndrome, Trisomy 9, Trisomy 13, Monosomy X, and Triploidy, however, the risk is not zero given the limitations of NIPS. Additionally, there are many genetic conditions that cannot be detected by NIPS.  Melissa Guzman previously completed carrier screening (scanned into Epic under the Media tab). She screened to be a silent carrier for alpha thalassemia. She screened to have an increased risk to be a carrier for Spinal Muscular Atrophy. She screened to not be a carrier for Cystic Fibrosis and Beta Hemoglobinopathies. A negative result on carrier screening reduces the likelihood of being a carrier, however, does not entirely rule out the possibility.     Genetic Counseling:   Silent Carrier for Alpha Thalassemia. Melissa Guzman is a silent carrier for alpha thalassemia (??/?-). Positive for the pathogenic alpha 3.7 deletion of the HBA2 gene. 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 Each of Melissa Guzman's children will either inherit two functional copies or one functional copy with one deletion from her. Melissa Guzman is not at an increased risk to have a baby with fetal hydrops due to Hemoglobin Barts disease (--/--) regardless of her reproductive partner's carrier status. We discussed there would be a  25% risk for the current pregnancy to be affected with Hemoglobin H disease (--/?-) if her reproductive partner is found to be an alpha thalassemia carrier in the cis configuration (??/--). Clinical features of Hemoglobin H disease are highly variable and generally develop in the first years of life. The primary symptoms include moderate anemia with marked microcytosis, jaundice, and hepatosplenomegaly. Some affected individuals do not require blood transfusions while others may require occasional blood transfusions throughout their lifetime. Because Melissa Guzman is a silent carrier for alpha thalassemia (??/?-), carrier screening for her reproductive partner is recommended to determine risk for the current pregnancy. We reviewed with Melissa Guzman that if her reproductive partner is found to be a carrier for alpha thalassemia, given his African American ancestry, it is more likely for him to be a silent carrier (??/?-) or a carrier in the trans configuration (?-/?-) as the cis configuration (??/--) has been reported very rarely in individuals with African American ancestry. If her reproductive partner is a non-carrier (??/??), a silent carrier (??/?-), or a carrier in the trans configuration (?-/?-) there would not be an increased risk for the pregnancy to have Hemoglobin H disease. Melissa Guzman declined carrier screening on her reproductive partner's behalf. We reviewed that alpha thalassemia is included in Melissa Guzman's Newborn Screening Program.   Increased Risk to be a Silent Carrier of Spinal Muscular Atrophy (SMA). SMA is an autosomal recessive disease caused by loss of function mutations in the SMN1 gene. 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 The  laboratory identified the variant [id number] in Melissa Guzman's screening, meaning there is increased risk for  Melissa Guzman two copies of the SMN1 gene to be on the same chromosome. Specifically, given Melissa Guzman's ethnic background, her risk to be a silent carrier is approximately 1 in 97. Genetic counseling reviewed with Melissa Guzman that if her two copies are on the same chromosome that means her other chromosome would have zero copies, and there could be risk for an affected pregnancy if her reproductive partner is found to be a carrier for SMA. Individuals with SMA have progressive, proximal, and symmetrical muscle weakness and atrophy due to degeneration of motor neurons of the spine and brainstem. Symptoms can first appear at any time between before birth to adulthood. There are five types of SMA that are historically classified based on clinical presentation, onset of symptoms, and the maximum skill level of motor milestones achieved, however, there is a lot of overlap between types. There are several treatments available for individuals affected with SMA and studies show that treatment is most effective when it is started in the first few months of life. Given Melissa Guzman's carrier screening result, genetic counseling recommended screening her reproductive partner for SMA to determine risk for the current pregnancy. Melissa Guzman declined carrier screening on her reproductive partner's behalf. We reviewed that SMA is included in Melissa Guzman's Newborn Screening Program.    Patient Plan:  Proceed with: Routine prenatal care Informed consent was obtained. All questions were answered.  Declined: Carrier Screening for Melissa Guzman's reproductive partner for alpha thalassemia and spinal muscular atrophy   Thank you for sharing in the care of Melissa Guzman with [place].  Please do not hesitate to contact us if you have any questions.  Teena Dunk, MS, [facility]

## 2021-10-09 ENCOUNTER — Encounter: Payer: Self-pay | Admitting: Family Medicine

## 2021-10-09 ENCOUNTER — Ambulatory Visit (INDEPENDENT_AMBULATORY_CARE_PROVIDER_SITE_OTHER): Payer: Medicaid Other | Admitting: Family Medicine

## 2021-10-09 VITALS — BP 118/66 | HR 112 | Wt 151.8 lb

## 2021-10-09 DIAGNOSIS — O99019 Anemia complicating pregnancy, unspecified trimester: Secondary | ICD-10-CM

## 2021-10-09 DIAGNOSIS — Z3491 Encounter for supervision of normal pregnancy, unspecified, first trimester: Secondary | ICD-10-CM

## 2021-10-09 MED ORDER — COMFORT FIT MATERNITY SUPP MED MISC: 1.0000 [IU] | Freq: Every day | 0 refills | Status: DC

## 2021-10-09 NOTE — Progress Notes (Signed)
° °  PRENATAL VISIT NOTE  Subjective:  Melissa Guzman is a 26 y.o. G1P0000 at [redacted]w[redacted]d being seen today for ongoing prenatal care.  She is currently monitored for the following issues for this low-risk pregnancy and has Supervision of low-risk pregnancy, first trimester; Anemia during pregnancy; Alpha thalassemia silent carrier; and Carrier of spinal muscular atrophy on their problem list.  Patient reports no complaints.  Contractions: Irritability. Vag. Bleeding: Scant.  Movement: Present. Denies leaking of fluid.   The following portions of the patient's history were reviewed and updated as appropriate: allergies, current medications, past family history, past medical history, past social history, past surgical history and problem list.   Objective:   Vitals:   10/09/21 1622  BP: 118/66  Pulse: (!) 112  Weight: 151 lb 12.8 oz (68.9 kg)    Fetal Status: Fetal Heart Rate (bpm): 165   Movement: Present     General:  Alert, oriented and cooperative. Patient is in no acute distress.  Skin: Skin is warm and dry. No rash noted.   Cardiovascular: Normal heart rate noted  Respiratory: Normal respiratory effort, no problems with respiration noted  Abdomen: Soft, gravid, appropriate for gestational age.  Pain/Pressure: Present     Pelvic: Cervical exam deferred        Extremities: Normal range of motion.  Edema: None  Mental Status: Normal mood and affect. Normal behavior. Normal judgment and thought content.   Assessment and Plan:  Pregnancy: G1P0000 at [redacted]w[redacted]d  1. Supervision of low-risk pregnancy, first trimester UTD Provided pregnancy restriction letter today Encouraged to talk to supervisor - AFP, Serum, Open Spina Bifida - Elastic Bandages & Supports (COMFORT FIT MATERNITY SUPP MED) MISC; 1 Units by Does not apply route daily.  Dispense: 1 each; Refill: 0  2. Anemia during pregnancy HGB today    Preterm labor symptoms and general obstetric precautions including but not limited to vaginal  bleeding, contractions, leaking of fluid and fetal movement were reviewed in detail with the patient. Please refer to After Visit Summary for other counseling recommendations.   Return for scheduled visit.  Future Appointments  Date Time Provider Department Center  11/14/2021  9:35 AM [facility]  11/20/2021  3:45 PM [facility]    Federico Flake, MD

## 2021-10-10 LAB — CBC
Hematocrit: 29.8 % — ABNORMAL LOW (ref 34.0–46.6)
Hemoglobin: 10.2 g/dL — ABNORMAL LOW (ref 11.1–15.9)
MCH: 29.8 pg (ref 26.6–33.0)
MCHC: 34.2 g/dL (ref 31.5–35.7)
MCV: 87 fL (ref 79–97)
Platelets: 277 10*3/uL (ref 150–450)
RBC: 3.42 x10E6/uL — ABNORMAL LOW (ref 3.77–5.28)
RDW: 12.8 % (ref 11.7–15.4)
WBC: 9.5 10*3/uL (ref 3.4–10.8)

## 2021-10-11 LAB — AFP, SERUM, OPEN SPINA BIFIDA
AFP MoM: 1.35
AFP Value: 76.6 ng/mL
Gest. Age on Collection Date: 19 weeks
Maternal Age At EDD: 23.5 yr
OSBR Risk 1 IN: 8303
Test Results:: NEGATIVE
Weight: 151 [lb_av]

## 2021-10-21 ENCOUNTER — Encounter: Payer: Self-pay | Admitting: Family Medicine

## 2021-10-21 MED ORDER — TERCONAZOLE 0.8 % VA CREA: 1.0000 | TOPICAL_CREAM | Freq: Every day | VAGINAL | 0 refills | Status: DC

## 2021-11-06 NOTE — BH Specialist Note (Signed)
Integrated Behavioral Health via Telemedicine Visit  11/20/2021 Melissa Guzman [id number]  Number of Integrated Behavioral Health Clinician visits: 2- Second Visit  Session Start time: 1559   Session End time: 1612  Total time in minutes: 13   Referring Provider: Federico Flake, MD Patient/Family location: Home [facility] Provider location: [facility]  All persons participating in visit: [name] and Melissa Guzman Melissa Guzman   Types of Service: Individual psychotherapy and Video visit  I connected with Melissa Guzman and/or Melissa Guzman's  n/a  via  Telephone or Video Enabled Telemedicine Application  (Video is Caregility application) and verified that I am speaking with the correct person using two identifiers. Discussed confidentiality: Yes   I discussed the limitations of telemedicine and the availability of in person appointments.  Discussed there is a possibility of technology failure and discussed alternative modes of communication if that failure occurs.  I discussed that engaging in this telemedicine visit, they consent to the provision of behavioral healthcare and the services will be billed under their insurance.  Patient and/or legal guardian expressed understanding and consented to Telemedicine visit: Yes   Presenting Concerns: Patient and/or family reports the following symptoms/concerns: Requesting "vaginal pills" for infection, questions about support visitors when she gives birth; no other concerns at this time.  Duration of problem: Current pregnancy; Severity of problem: mild  Patient and/or Family's Strengths/Protective Factors: Social connections, Concrete supports in place (healthy food, safe environments, etc.), Sense of purpose, and Physical Health (exercise, healthy diet, medication compliance, etc.)  Goals Addressed: Patient will:  Maintain reduction of symptoms of: anxiety and stress   Progress towards  Goals: Ongoing  Interventions: Interventions utilized:  Psychoeducation and/or Health Education, Link to [employer], and Supportive Reflection Standardized Assessments completed: Not Needed  Patient and/or Family Response: Pt agrees with treatment plan; requests follow up in one month  Assessment: Patient currently experiencing Adjustment disorder with anxious mood.   Patient may benefit from continued brief therapeutic interventions.  Plan: Follow up with behavioral health clinician on : One month; Call Melissa Guzman at [phone], as needed. Clinical staff will call to clarify need for additional medication as requested Behavioral recommendations:  -Continue taking prenatal vitamin and iron pill as prescribed -Go to [email or web address] for information about hospital visitors and to register for childbirth education class -Read through Postpartum Planner (on After Visit Summary) Referral(s): Integrated [employer] (In Clinic)  I discussed the assessment and treatment plan with the patient and/or parent/guardian. They were provided an opportunity to ask questions and all were answered. They agreed with the plan and demonstrated an understanding of the instructions.   They were advised to call back or seek an in-person evaluation if the symptoms worsen or if the condition fails to improve as anticipated.  Rae Lips, LCSW  Depression screen [facility] 2/9 11/14/2021 10/09/2021 09/05/2021 08/01/2021  Decreased Interest 1 1 1 2   Down, Depressed, Hopeless 0 1 1 0  PHQ - 2 Score 1 2 2 2   Altered sleeping 0 0 3 1  Tired, decreased energy 0 1 2 1   Change in appetite 1 0 0 1  Feeling bad or failure about yourself  0 1 1 0  Trouble concentrating 0 0 0 0  Moving slowly or fidgety/restless 0 0 1 0  Suicidal thoughts 0 0 0 0  PHQ-9 Score 2 4 9 5   Difficult doing work/chores Not difficult at all - Not difficult at all -  GAD 7 : Generalized Anxiety Score 11/14/2021  10/09/2021 09/05/2021 08/01/2021  Nervous, Anxious, on Edge 1 0 1 1  Control/stop worrying 1 0 3 0  Worry too much - different things 1 1 1 1   Trouble relaxing 1 0 1 0  Restless 0 0 0 0  Easily annoyed or irritable 1 0 0 0  Afraid - awful might happen 1 0 3 0  Total GAD 7 Score 6 1 9 2   Anxiety Difficulty Not difficult at all - Not difficult at all -

## 2021-11-14 ENCOUNTER — Ambulatory Visit (INDEPENDENT_AMBULATORY_CARE_PROVIDER_SITE_OTHER): Payer: Medicaid Other | Admitting: Family Medicine

## 2021-11-14 ENCOUNTER — Other Ambulatory Visit: Payer: Self-pay

## 2021-11-14 VITALS — BP 134/82 | HR 101 | Wt 156.4 lb

## 2021-11-14 DIAGNOSIS — Z3491 Encounter for supervision of normal pregnancy, unspecified, first trimester: Secondary | ICD-10-CM

## 2021-11-14 DIAGNOSIS — O99019 Anemia complicating pregnancy, unspecified trimester: Secondary | ICD-10-CM

## 2021-11-14 NOTE — Progress Notes (Signed)
° °  PRENATAL VISIT NOTE  Subjective:  Melissa Guzman is a 26 y.o. G1P0000 at [redacted]w[redacted]d being seen today for ongoing prenatal care.  She is currently monitored for the following issues for this low-risk pregnancy and has Supervision of low-risk pregnancy, first trimester; Anemia during pregnancy; Alpha thalassemia silent carrier; and Carrier of spinal muscular atrophy on their problem list.  Patient reports no complaints.  Contractions: Not present. Vag. Bleeding: Scant.  Movement: Present. Denies leaking of fluid.   The following portions of the patient's history were reviewed and updated as appropriate: allergies, current medications, past family history, past medical history, past social history, past surgical history and problem list.   Objective:   Vitals:   11/14/21 1003  BP: 134/82  Pulse: (!) 101  Weight: 156 lb 6.4 oz (70.9 kg)    Fetal Status: Fetal Heart Rate (bpm): 159 Fundal Height: 24 cm Movement: Present     General:  Alert, oriented and cooperative. Patient is in no acute distress.  Skin: Skin is warm and dry. No rash noted.   Cardiovascular: Normal heart rate noted  Respiratory: Normal respiratory effort, no problems with respiration noted  Abdomen: Soft, gravid, appropriate for gestational age.  Pain/Pressure: Absent     Pelvic: Cervical exam deferred        Extremities: Normal range of motion.  Edema: None  Mental Status: Normal mood and affect. Normal behavior. Normal judgment and thought content.   Assessment and Plan:  Pregnancy: G1P0000 at [redacted]w[redacted]d 1. Supervision of low-risk pregnancy, first trimester Up to date Feeling well, no concern  2. Anemia during pregnancy HGB in Jan 10.2 Recheck at 28 weeks  Preterm labor symptoms and general obstetric precautions including but not limited to vaginal bleeding, contractions, leaking of fluid and fetal movement were reviewed in detail with the patient. Please refer to After Visit Summary for other counseling recommendations.    Return in about 4 weeks (around 12/12/2021) for Routine prenatal care, scheduled visit.  Future Appointments  Date Time Provider Department Center  11/20/2021  3:45 PM [facility] HEALTH CLINICIAN [facility]  12/02/2021 10:15 AM [facility]  12/16/2021  8:15 AM [facility]  01/17/2022  9:55 AM Crissie Reese, Mary Sella, MD [facility]    Federico Flake, MD

## 2021-11-20 ENCOUNTER — Ambulatory Visit: Payer: 59 | Admitting: Clinical

## 2021-11-20 DIAGNOSIS — N760 Acute vaginitis: Secondary | ICD-10-CM

## 2021-11-20 DIAGNOSIS — F4322 Adjustment disorder with anxiety: Secondary | ICD-10-CM

## 2021-11-20 DIAGNOSIS — B9689 Other specified bacterial agents as the cause of diseases classified elsewhere: Secondary | ICD-10-CM

## 2021-11-20 MED ORDER — METRONIDAZOLE 500 MG PO TABS: 500.0000 mg | ORAL_TABLET | Freq: Two times a day (BID) | ORAL | 0 refills | Status: DC

## 2021-11-20 NOTE — Patient Instructions (Signed)
[facility] for [facility] at [facility] [place],  [address] [phone] (main office) [phone] [facility] office)  [email or web address]     BRAINSTORMING  Develop a Plan Goals: Provide a way to start conversation about your new life with a baby Assist parents in recognizing and using resources within their reach Help pave the way before birth for an easier period of transition afterwards.  Make a list of the following information to keep in a central location: Full name of Mom and Partner: _____________________________________________ 69 full name and Date of Birth: ___________________________________________ Home Address: ___________________________________________________________ ________________________________________________________________________ Home Phone: ____________________________________________________________ Parents' cell numbers: _____________________________________________________ ________________________________________________________________________ Name and contact info for OB: ______________________________________________ Name and contact info for Pediatrician:________________________________________ Contact info for Lactation Consultants: ________________________________________  REST and SLEEP *You each need at least 4-5 hours of uninterrupted sleep every day. Write specific names and contact information.* How are you going to rest in the postpartum period? While partner's home? When partner returns to work? When you both return to work? Where will your baby sleep? Who is available to help during the day? Evening? Night? Who could move in for a period to help support you? What are some ideas to help you get enough  sleep? __________________________________________________________________________________________________________________________________________________________________________________________________________________________________________ NUTRITIOUS FOOD AND DRINK *Plan for meals before your baby is born so you can have healthy food to eat during the immediate postpartum period.* Who will look after breakfast? Lunch? Dinner? List names and contact information. Brainstorm quick, healthy ideas for each meal. What can you do before baby is born to prepare meals for the postpartum period? How can others help you with meals? Which grocery stores provide online shopping and delivery? Which restaurants offer take-out or delivery options? ______________________________________________________________________________________________________________________________________________________________________________________________________________________________________________________________________________________________________________________________________________________________________________________________________  CARE FOR MOM *It's important that mom is cared for and pampered in the postpartum period. Remember, the most important ways new mothers need care are: sleep, nutrition, gentle exercise, and time off.* Who can come take care of mom during this period? Make a list of people with their contact information. List some activities that make you feel cared for, rested, and energized? Who can make sure you have opportunities to do these things? Does mom have a space of her very own within your home that's just for her? Make a [facility] where she can be comfortable, rest, and renew herself  daily. ______________________________________________________________________________________________________________________________________________________________________________________________________________________________________________________________________________________________________________________________________________________________________________________________________    CARE FOR AND FEEDING BABY *Knowledgeable and encouraging people will offer the best support with regard to feeding your baby.* Educate yourself and choose the best feeding option for your baby. Make a list of people who will guide, support, and be a resource for you as your care for and feed your baby. (Friends that have breastfed or are currently breastfeeding, lactation consultants, breastfeeding support groups, etc.) Consider a postpartum doula. (These websites can give you information: dona.org & BuyingShow.es) Seek out local breastfeeding resources like the breastfeeding support group at [employer] or [employer]. ______________________________________________________________________________________________________________________________________________________________________________________________________________________________________________________________________________________________________________________________________________________________________________________________________  Verner Chol AND ERRANDS Who can help with a thorough cleaning before baby is born? Make a list of people who will help with housekeeping and chores, like laundry, light cleaning, dishes, bathrooms, etc. Who can run some errands for you? What can you do to make sure you are stocked with basic supplies before baby is born? Who is going to do the  shopping? ______________________________________________________________________________________________________________________________________________________________________________________________________________________________________________________________________________________________________________________________________________________________________________________________________     Family Adjustment *Nurture yourselvesit helps parents be more loving and allows for better bonding with their child.* What sorts of things do you and  partner enjoy doing together? Which activities help you to connect and strengthen your relationship? Make a list of those things. Make a list of people whom you trust to care for your baby so you can have some time together as a couple. What types of things help partner feel connected to Mom? Make a list. What needs will partner have in order to bond with baby? Other children? Who will care for them when you go into labor and while you are in the hospital? Think about what the needs of your older children might be. Who can help you meet those needs? In what ways are you helping them prepare for bringing baby home? List some specific strategies you have for family adjustment. _______________________________________________________________________________________________________________________________________________________________________________________________________________________________________________________________________________________________________________________________________________  SUPPORT *Someone who can empathize with experiences normalizes your problems and makes them more bearable.* Make a list of other friends, neighbors, and/or co-workers you know with infants (and small children, if applicable) with whom you can connect. Make a list of local or online support groups, mom groups, etc. in which you can be  involved. ______________________________________________________________________________________________________________________________________________________________________________________________________________________________________________________________________________________________________________________________________________________________________________________________________  Childcare Plans Investigate and plan for childcare if mom is returning to work. Talk about mom's concerns about her transition back to work. Talk about partner's concerns regarding this transition.  Mental Health *Your mental health is one of the highest priorities for a pregnant or postpartum mom.* 1 in 5 women experience anxiety and/or depression from the time of conception through the first year after birth. Postpartum Mood Disorders are the #1 complication of pregnancy and childbirth and the suffering experienced by these mothers is not necessary! These illnesses are temporary and respond well to treatment, which often includes self-care, social support, talk therapy, and medication when needed. Women experiencing anxiety and depression often say things like: I'm supposed to be happywhy do I feel so sad?, Why can't I snap out of it?, I'm having thoughts that scare me. There is no need to be embarrassed if you are feeling these symptoms: Overwhelmed, anxious, angry, sad, guilty, irritable, hopeless, exhausted but can't sleep You are NOT alone. You are NOT to blame. With help, you WILL be well. Where can I find help? Medical professionals such as your OB, midwife, gynecologist, family practitioner, primary care provider, pediatrician, or mental health providers; [facility] support groups: Feelings After Birth, Breastfeeding Support Group, Baby and Me Group, and Fit 4 Two exercise classes. You have permission to ask for help. It will confirm your feelings, validate your experiences,  share/learn coping strategies, and gain support and encouragement as you heal. You are important! BRAINSTORM Make a list of local resources, including resources for mom and for partner. Identify support groups. Identify people to call late at night - include names and contact info. Talk with partner about perinatal mood and anxiety disorders. Talk with your OB, midwife, and doula about baby blues and about perinatal mood and anxiety disorders. Talk with your pediatrician about perinatal mood and anxiety disorders.   Support & Sanity Savers   What do you really need?  Basics In preparing for a new baby, many expectant parents spend hours shopping for baby clothes, decorating the nursery, and deciding which car seat to buy. Yet most don't think much about what the reality of parenting a newborn will be like, and what they need to make it through that. So, here is the advice of experienced parents. We know you'll read this, and think they're exaggerating, I don't really need that. Just trust [place] on these, OK? Plan for  all of this, and if it turns out you don't need it, come back and teach [place] how you did it!  Must-Haves (Once baby's survival needs are met, make sure you attend to your own survival needs!) Sleep An average newborn sleeps 16-18 hours per day, over 6-7 sleep periods, rarely more than three hours at a time. It is normal and healthy for a newborn to wake throughout the night... but really hard on parents!! Naps. Prioritize sleep above any responsibilities like: cleaning house, visiting friends, running errands, etc.  Sleep whenever baby sleeps. If you can't nap, at least have restful times when baby eats. The more rest you get, the more patient you will be, the more emotionally stable, and better at solving problems.  Food You may not have realized it would be difficult to eat when you have a newborn. Yet, when we talk to countless new parents, they say things like it may be 2:00 pm  when I realize I haven't had breakfast yet. Or every time we sit down to dinner, baby needs to eat, and my food gets cold, so I don't bother to eat it. Finger food. Before your baby is born, stock up with one months' worth of food that: 1) you can eat with one hand while holding a baby, 2) doesn't need to be prepped, 3) is good hot or cold, 4) doesn't spoil when left out for a few hours, and 5) you like to eat. Think about: nuts, dried fruit, Clif bars, pretzels, jerky, gogurt, baby carrots, apples, bananas, crackers, cheez-n-crackers, string cheese, hot pockets or frozen burritos to microwave, garden burgers and breakfast pastries to put in the toaster, yogurt drinks, etc. Restaurant Menus. Make lists of your favorite restaurants & menu items. When family/friends want to help, you can give specific information without much thought. They can either bring you the food or send gift cards for just the right meals. Freezer Meals.  Take some time to make a few meals to put in the freezer ahead of time.  Easy to freeze meals can be anything such as soup, lasagna, chicken pie, or spaghetti sauce. Set up a Meal Schedule.  Ask friends and family to sign up to bring you meals during the first few weeks of being home. (It can be passed around at baby showers!) You have no idea how helpful this will be until you are in the throes of parenting.  https://hamilton-woodard.[email or web address]/ is a great website to check out. Emotional Support Know who to call when you're stressed out. Parenting a newborn is very challenging work. There are times when it totally overwhelms your normal coping abilities. EVERY NEW PARENT NEEDS TO HAVE A PLAN FOR WHO TO CALL WHEN THEY JUST CAN'T COPE ANY MORE. (And it has to be someone other than the baby's other parent!) Before your baby is born, come up with at least one person you can call for support - write their phone number down and post it on the refrigerator. Anxiety & Sadness. Baby blues are normal after  pregnancy; however, there are more severe types of anxiety & sadness which can occur and should not be ignored.  They are always treatable, but you have to take the first step by reaching out for help. [facility] offers a Mom Talk group which meets every Tuesday from 10 am - 11 am.  This group is for new moms who need support and connection after their babies are born.  Call [phone].  Really, Really Helpful (Plan  for them! Make sure these happen often!!) Physical Support with Taking Care of Yourselves Asking friends and family. Before your baby is born, set up a schedule of people who can come and visit and help out (or ask a friend to schedule for you). Any time someone says let me know what I can do to help, sign them up for a day. When they get there, their job is not to take care of the baby (that's your job and your joy). Their job is to take care of you!  Postpartum doulas. If you don't have anyone you can call on for support, look into postpartum doulas:  professionals at helping parents with caring for baby, caring for themselves, getting breastfeeding started, and helping with household tasks. www.padanc.org is a helpful website for learning about doulas in our area. Peer Support / Parent Groups Why: One of the greatest ideas for new parents is to be around other new parents. Parent groups give you a chance to share and listen to others who are going through the same season of life, get a sense of what is normal infant development by watching several babies learn and grow, share your stories of triumph and struggles with empathetic ears, and forgive your own mistakes when you realize all parents are learning by trial and error. Where to find: There are many places you can meet other new parents throughout our community.  [facility] offers the following classes for new moms and their little ones:  Baby and Me (Birth to [place]) and Breastfeeding Support Group. Go to  [email or web address] or call [phone] for more information. Time for your Relationship It's easy to get so caught up in meeting baby's immediate needs that it's hard to find time to connect with your partner, and meet the needs of your relationship. It's also easy to forget what quality time with your partner actually looks like. If you take your baby on a date, you'd be amazed how much of your couple time is spent feeding the baby, diapering the baby, admiring the baby, and talking about the baby. Dating: Try to take time for just the two of you. Babysitter tip: Sometimes when moms are breastfeeding a newborn, they find it hard to figure out how to schedule outings around baby's unpredictable feeding schedules. Have the babysitter come for a three hour period. When she comes over, if baby has just eaten, you can leave right away, and come back in two hours. If baby hasn't fed recently, you start the date at home. Once baby gets hungry and gets a good feeding in, you can head out for the rest of your date time. Date Nights at Home: If you can't get out, at least set aside one evening a week to prioritize your relationship: whenever baby dozes off or doesn't have any immediate needs, spend a little time focusing on each other. Potential conflicts: The main relationship conflicts that come up for new parents are: issues related to sexuality, financial stresses, a feeling of an unfair division of household tasks, and conflicts in parenting styles. The more you can work on these issues before baby arrives, the better!  Fun and Frills (Don't forget these and don't feel guilty for indulging in them!) Everyone has something in life that is a fun little treat that they do just for themselves. It may be: reading the morning paper, or going for a daily jog, or having coffee with a friend once a week, or going to a movie on  Friday nights, or fine chocolates, or bubble baths, or curling up with a good  book. Unless you do fun things for yourself every now and then, it's hard to have the energy for fun with your baby. Whatever your special treats are, make sure you find a way to continue to indulge in them after your baby is born. These special moments can recharge you, and allow you to return to baby with a new joy   PERINATAL MOOD DISORDERS: [place]   _________________________________________Emergency and Crisis Resources If you are an imminent risk to self or others, are experiencing intense personal distress, and/or have noticed significant changes in activities of daily living, call:  [place]: [phone]  [address] [place], [place], [address] Mobile Crisis: [place]: [phone] Or visit the following crisis centers: Local Emergency Departments [facility]: [address], [place]. Hours: 8:30AM-5PM. Insurance Accepted: Medicaid, Medicare, and Uninsured.  [facility]:  [address] [place]  Mon-Friday 8am-3pm, [phone]                                                                                  ___________ Non-Crisis Resources To identify specific providers that are covered by your insurance, contact your insurance company or local agencies:  [place]: [phone] [facility] [employer]: [place]: [phone] Postpartum Support International- Warm-line: 778-047-1468                                                      __Outpatient Therapy and Medication Management   Providers:  [facility]: [phone] Hours: 9AM-5PM  Insurance Accepted: Alben Spittle, Shane Crutch, [place], [place] [facility]): [phone] Hours: 8AM-5:30PM  nsurance Accepted: All insurances EXCEPT AARP, [place],  [place], and [place]: [phone] Hours: 8AM-8PM Insurance Accepted: Cristal Ford, Freddrick March, [place], Medicare, [phone] [facility]: [phone] Hours: 8:30AM-7PM Insurance Accepted: Cristal Ford, Medicaid, Medicare, Tricare, [employer] Counseling:  [place] Accepted:  Holland Falling, Lorella Nimrod, [employer], [place]: [phone] Hours: 9AM-5:30PM Insurance Accepted: Alben Spittle, Charlotte Crumb, and Medicaid, Medicare, [facility]:  [phone] Hours: 9am-5pm Insurance Accepted: BCBS; they do not accept Medicaid/Medicare [facility] [place]: [phone] Hours: 9am-9pm Insurance Accepted: All major insurance including Medicaid and Medicare [facility]: [phone] Hours: [place] Accepted: All insurances EXCEPT Medicaid and Medicare. [facility]: [phone]   ____________  Parenting [place]: [phone] [place]:  [place]: (support for children in the NICU and/or with special needs), [phone]   ___________                                                                 Mental Health Support Groups Mental Health Association: [phone]    _____________                                                                                  Online Resources Postpartum Support International: http://jones-berg.[email or web address]/  800-944-4PPD 2Moms Supporting Moms:  www.momssupportingmoms.net

## 2021-11-21 ENCOUNTER — Encounter: Payer: Self-pay | Admitting: Family Medicine

## 2021-12-02 ENCOUNTER — Other Ambulatory Visit: Payer: Self-pay

## 2021-12-02 ENCOUNTER — Ambulatory Visit (INDEPENDENT_AMBULATORY_CARE_PROVIDER_SITE_OTHER): Payer: 59 | Admitting: Family Medicine

## 2021-12-02 ENCOUNTER — Encounter: Payer: Self-pay | Admitting: Family Medicine

## 2021-12-02 VITALS — BP 116/73 | HR 104 | Wt 156.0 lb

## 2021-12-02 DIAGNOSIS — Z3491 Encounter for supervision of normal pregnancy, unspecified, first trimester: Secondary | ICD-10-CM

## 2021-12-02 DIAGNOSIS — O99019 Anemia complicating pregnancy, unspecified trimester: Secondary | ICD-10-CM

## 2021-12-02 NOTE — Progress Notes (Signed)
? ? ?  PRENATAL VISIT NOTE ? ?Subjective:  ?Melissa Guzman is a 26 y.o. G1P0000 at [redacted]w[redacted]d being seen today for ongoing prenatal care.  She is currently monitored for the following issues for this low-risk pregnancy and has Supervision of low-risk pregnancy, first trimester; Anemia during pregnancy; Alpha thalassemia silent carrier; and Carrier of spinal muscular atrophy on their problem list. ? ?Patient reports no complaints.  Contractions: Not present. Vag. Bleeding: None.  Movement: Present. Denies leaking of fluid.  ? ?The following portions of the patient's history were reviewed and updated as appropriate: allergies, current medications, past family history, past medical history, past social history, past surgical history and problem list.  ? ?Objective:  ? ?Vitals:  ? 12/02/21 1013  ?BP: 116/73  ?Pulse: (!) 104  ?Weight: 156 lb (70.8 kg)  ? ? ?Fetal Status: Fetal Heart Rate (bpm): 164   Movement: Present    ? ?General:  Alert, oriented and cooperative. Patient is in no acute distress.  ?Skin: Skin is warm and dry. No rash noted.   ?Cardiovascular: Normal heart rate noted  ?Respiratory: Normal respiratory effort, no problems with respiration noted  ?Abdomen: Soft, gravid, appropriate for gestational age.  Pain/Pressure: Absent     ?Pelvic: Cervical exam deferred        ?Extremities: Normal range of motion.  Edema: None  ?Mental Status: Normal mood and affect. Normal behavior. Normal judgment and thought content.  ? ?Assessment and Plan:  ?Pregnancy: G1P0000 at [redacted]w[redacted]d ?1. Supervision of low-risk pregnancy, first trimester ?Up to date ?Desires Tdap today ? ?2. Anemia during pregnancy ?Mild, 10.2 last check in Jan ?Recheck hgb on 3/20 ? ?Preterm labor symptoms and general obstetric precautions including but not limited to vaginal bleeding, contractions, leaking of fluid and fetal movement were reviewed in detail with the patient. ?Please refer to After Visit Summary for other counseling recommendations.  ? ?Return in about  2 weeks (around 12/16/2021) for Routine prenatal care. ? ?Future Appointments  ?Date Time Provider Department Center  ?12/16/2021  8:15 AM [facility]  ?12/16/2021  8:20 AM [facility] LAB [facility]  ?12/30/2021 11:15 AM Federico Flake, MD [facility]  ?01/17/2022  9:55 AM Crissie Reese, Mary Sella, MD [facility]  ? ? ?Federico Flake, MD ?

## 2021-12-02 NOTE — Patient Instructions (Signed)
DOULA LIST   [facility] Doula  Sierra Bizzell  [phone]  [email or web address]  [email or web address]  Zula [facility] Doula Zula Price [phone]  [email or web address]   [facility]   Precious J. Bradley   [email or web address]   ??THE MOTHERLY DOULA?? Serenna Dawson   [phone]   [email or web address]     [facility] Doul[facility]  Evelyn Tinsley  [phone]    [email or web address]   [facility]  Angie Rosier     [phone]     [email or web address]   Rachel McMillen: Doula & Photographer   Rachel McMillen [phone]       [email or web address]  [email or web address]   Amelia Mattocks [facility]a [facility]  Amelia Mattocks [phone]   [email or web address]   [facility]  Tiffany Slade  [phone]  [email or web address]   [email or web address]   [facility]   Iris Jones   [phone]  [email or web address]   Mary Walt Owings Doula  Mary Walt  [phone] [email or web address]  [facility] Doulas  Jessica Bower           Sarah Carter         Christina Flaherty       Lora Reynolds     [phone] [email or web address]  [email or web address]   [facility]   Ciara Foxx [phone] [email or web address]   [facility]  Robin StJohn     [phone]  [email or web address]  Soleil Doula  Jaden Millner     [phone]  [email or web address]  Facebook and IG [email or web address]   Bernadette Vereen  [phone] [email or web address]    Breanna Grant [phone] [email or web address]   Melissa Luck  [phone] [email or web address]     Madison Manson  [phone] [email or web address]   IG [email or web address]   Cierra Moore    [phone]  [email or web address]   Jerilyn "Jeri" Free  [phone] [email or web address]    Mtende Roll  [phone] [email or web address]   Susie Williams   [email or web address]    Liz Chavez    [phone] [email or web address]     Jessica Ayivi  [phone] [email or web address]    Zarmena Woods  [phone] [email or web address]    Shayla Rhem    [phone]   [facility] Joie Morrison  [phone] [email or web address]  Doula Mama Kathryn Farrar [phone] [email or web address]  [facility] Joie Morrison  [phone] [email or web address]  [facility]eth [facility]      Beth Ann Martin [phone]  [email or web address]  [email or web address]   ShawnTina Harris-Jones  [phone] [email or web address]   Sharyn Gietzen [phone] [email or web address]   Carlee Henry [phone] [email or web address]   Leatrice Priest  [phone] [email or web address]  [facility] [employer] [employer]  Terry Anderson  [phone] [email or web address]   Leslie King [phone] [email or web address]  MOOR Divine Myeka Dunn  [email or web address]   T-sheana Turner [phone] [email or web address]   Maya Jackson [phone] [email or web address]   Juante Randleman [phone] [email or web address]         

## 2021-12-16 ENCOUNTER — Ambulatory Visit (INDEPENDENT_AMBULATORY_CARE_PROVIDER_SITE_OTHER): Payer: Medicaid Other | Admitting: Family Medicine

## 2021-12-16 ENCOUNTER — Other Ambulatory Visit: Payer: Self-pay

## 2021-12-16 ENCOUNTER — Other Ambulatory Visit: Payer: 59

## 2021-12-16 ENCOUNTER — Other Ambulatory Visit: Payer: Self-pay | Admitting: General Practice

## 2021-12-16 VITALS — BP 130/86 | HR 103 | Wt 158.8 lb

## 2021-12-16 DIAGNOSIS — Z3491 Encounter for supervision of normal pregnancy, unspecified, first trimester: Secondary | ICD-10-CM

## 2021-12-16 DIAGNOSIS — Z23 Encounter for immunization: Secondary | ICD-10-CM

## 2021-12-16 DIAGNOSIS — O99019 Anemia complicating pregnancy, unspecified trimester: Secondary | ICD-10-CM

## 2021-12-16 MED ORDER — ONDANSETRON 4 MG PO TBDP
4.0000 mg | ORAL_TABLET | Freq: Four times a day (QID) | ORAL | 0 refills | Status: DC | PRN
Start: 2021-12-16 — End: 2022-03-05

## 2021-12-16 NOTE — Progress Notes (Signed)
? ?  PRENATAL VISIT NOTE ? ?Subjective:  ?Melissa Guzman is a 26 y.o. G1P0000 at [redacted]w[redacted]d being seen today for ongoing prenatal care.  She is currently monitored for the following issues for this low-risk pregnancy and has Supervision of low-risk pregnancy, first trimester; Anemia during pregnancy; Alpha thalassemia silent carrier; and Carrier of spinal muscular atrophy on their problem list. ? ?Patient reports no complaints.  Contractions: Not present. Vag. Bleeding: None.  Movement: Present. Denies leaking of fluid.  ? ?The following portions of the patient's history were reviewed and updated as appropriate: allergies, current medications, past family history, past medical history, past social history, past surgical history and problem list.  ? ?Objective:  ? ?Vitals:  ? 12/16/21 0847  ?BP: 130/86  ?Pulse: (!) 103  ?Weight: 158 lb 12.8 oz (72 kg)  ? ? ?Fetal Status: Fetal Heart Rate (bpm): 165   Movement: Present    ? ?General:  Alert, oriented and cooperative. Patient is in no acute distress.  ?Skin: Skin is warm and dry. No rash noted.   ?Cardiovascular: Normal heart rate noted  ?Respiratory: Normal respiratory effort, no problems with respiration noted  ?Abdomen: Soft, gravid, appropriate for gestational age.  Pain/Pressure: Present     ?Pelvic: Cervical exam deferred        ?Extremities: Normal range of motion.  Edema: None  ?Mental Status: Normal mood and affect. Normal behavior. Normal judgment and thought content.  ? ?Assessment and Plan:  ?Pregnancy: G1P0000 at [redacted]w[redacted]d ? ?1. Supervision of low-risk pregnancy, first trimester ?Up to date ?Reviewed infant feeding and reproductive plan ?Tdap offered and accepted ?3rd trim labs today-- patient vomited 30 minutes after glucose drink.  Cancelled GTT today and needs reschedule ? ?2. Anemia during pregnancy ?CBC today  ? ?Preterm labor symptoms and general obstetric precautions including but not limited to vaginal bleeding, contractions, leaking of fluid and fetal movement  were reviewed in detail with the patient. ?Please refer to After Visit Summary for other counseling recommendations.  ? ?Return in about 2 weeks (around 12/30/2021) for Mom+Baby Combined Care. ? ?Future Appointments  ?Date Time Provider Department Center  ?12/20/2021  8:50 AM [facility] LAB [facility]  ?12/30/2021 11:15 AM Federico Flake, MD [facility]  ?01/17/2022  9:55 AM Venora Maples, MD [facility]  ?02/04/2022  2:15 PM Venora Maples, MD [facility]  ?02/13/2022 10:35 AM Federico Flake, MD [facility]  ?02/21/2022  9:55 AM Crissie Reese, Mary Sella, MD [facility]  ? ? ?Federico Flake, MD ?

## 2021-12-17 LAB — RPR: RPR Ser Ql: NONREACTIVE

## 2021-12-17 LAB — CBC
Hematocrit: 29.7 % — ABNORMAL LOW (ref 34.0–46.6)
Hemoglobin: 10 g/dL — ABNORMAL LOW (ref 11.1–15.9)
MCH: 29.4 pg (ref 26.6–33.0)
MCHC: 33.7 g/dL (ref 31.5–35.7)
MCV: 87 fL (ref 79–97)
Platelets: 271 10*3/uL (ref 150–450)
RBC: 3.4 x10E6/uL — ABNORMAL LOW (ref 3.77–5.28)
RDW: 11.9 % (ref 11.7–15.4)
WBC: 8.2 10*3/uL (ref 3.4–10.8)

## 2021-12-17 LAB — HIV ANTIBODY (ROUTINE TESTING W REFLEX): HIV Screen 4th Generation wRfx: NONREACTIVE

## 2021-12-20 ENCOUNTER — Other Ambulatory Visit: Payer: Self-pay

## 2021-12-20 ENCOUNTER — Other Ambulatory Visit: Payer: 59

## 2021-12-20 ENCOUNTER — Encounter: Payer: Self-pay | Admitting: Family Medicine

## 2021-12-20 DIAGNOSIS — Z3491 Encounter for supervision of normal pregnancy, unspecified, first trimester: Secondary | ICD-10-CM

## 2021-12-21 LAB — GLUCOSE TOLERANCE, 2 HOURS W/ 1HR
Glucose, 1 hour: 136 mg/dL (ref 70–179)
Glucose, 2 hour: 101 mg/dL (ref 70–152)
Glucose, Fasting: 91 mg/dL (ref 70–91)

## 2021-12-30 ENCOUNTER — Encounter: Payer: Self-pay | Admitting: Family Medicine

## 2021-12-30 ENCOUNTER — Other Ambulatory Visit (HOSPITAL_COMMUNITY)
Admission: RE | Admit: 2021-12-30 | Discharge: 2021-12-30 | Disposition: A | Discharge status: Home / Self Care | Payer: Medicaid Other | Source: Ambulatory Visit | Attending: Family Medicine | Admitting: Family Medicine

## 2021-12-30 ENCOUNTER — Ambulatory Visit (INDEPENDENT_AMBULATORY_CARE_PROVIDER_SITE_OTHER): Payer: Medicaid Other | Admitting: Family Medicine

## 2021-12-30 VITALS — BP 129/78 | HR 121 | Wt 165.9 lb

## 2021-12-30 DIAGNOSIS — Z3491 Encounter for supervision of normal pregnancy, unspecified, first trimester: Secondary | ICD-10-CM

## 2021-12-30 DIAGNOSIS — Z111 Encounter for screening for respiratory tuberculosis: Secondary | ICD-10-CM

## 2021-12-30 DIAGNOSIS — B9689 Other specified bacterial agents as the cause of diseases classified elsewhere: Secondary | ICD-10-CM

## 2021-12-30 DIAGNOSIS — F419 Anxiety disorder, unspecified: Secondary | ICD-10-CM

## 2021-12-30 DIAGNOSIS — N760 Acute vaginitis: Secondary | ICD-10-CM

## 2021-12-30 DIAGNOSIS — O99019 Anemia complicating pregnancy, unspecified trimester: Secondary | ICD-10-CM

## 2021-12-30 MED ORDER — HYDROXYZINE HCL 25 MG PO TABS: 25.0000 mg | ORAL_TABLET | Freq: Four times a day (QID) | ORAL | 2 refills | Status: DC | PRN

## 2021-12-30 MED ORDER — SERTRALINE HCL 25 MG PO TABS
25.0000 mg | ORAL_TABLET | Freq: Every day | ORAL | 3 refills | Status: DC
Start: 2021-12-30 — End: 2022-01-17
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
## 2021-12-31 LAB — CERVICOVAGINAL ANCILLARY ONLY
Bacterial Vaginitis (gardnerella): POSITIVE — AB
Candida Glabrata: NEGATIVE
Candida Vaginitis: NEGATIVE
Chlamydia: NEGATIVE
Comment: NEGATIVE
Comment: NEGATIVE
Comment: NEGATIVE
Comment: NEGATIVE
Comment: NEGATIVE
Comment: NORMAL
Neisseria Gonorrhea: NEGATIVE
Trichomonas: NEGATIVE

## 2021-12-31 LAB — CBC
Hematocrit: 29.2 % — ABNORMAL LOW (ref 34.0–46.6)
Hemoglobin: 9.9 g/dL — ABNORMAL LOW (ref 11.1–15.9)
MCH: 29.5 pg (ref 26.6–33.0)
MCHC: 33.9 g/dL (ref 31.5–35.7)
MCV: 87 fL (ref 79–97)
Platelets: 264 10*3/uL (ref 150–450)
RBC: 3.36 x10E6/uL — ABNORMAL LOW (ref 3.77–5.28)
RDW: 12.2 % (ref 11.7–15.4)
WBC: 8.3 10*3/uL (ref 3.4–10.8)

## 2021-12-31 MED ORDER — METRONIDAZOLE 500 MG PO TABS: 500.0000 mg | ORAL_TABLET | Freq: Two times a day (BID) | ORAL | 0 refills | Status: DC

## 2021-12-31 NOTE — BH Specialist Note (Signed)
Integrated Behavioral Health via Telemedicine Visit ? ?01/13/2022 ?Melissa Guzman ?[id number] ? ?Number of [place] Clinician visits: 3- Third Visit ? ?Session Start time: 1636 ?  ?Session End time: [id number] ? ?Total time in minutes: 20 ? ? ?Referring Provider: Caren Macadam, MD ?Patient/Family location: Home ?[facility] Provider location: Center for [employer] at [facility] ? ?All persons participating in visit: [name] and Melissa Guzman  ? ?Types of Service: Individual psychotherapy and Video visit ? ?I connected with Melissa Guzman and/or Melissa Guzman's  n/a  via  Telephone or Video Enabled Telemedicine Application  (Video is Caregility application) and verified that I am speaking with the correct person using two identifiers. Discussed confidentiality: Yes  ? ?I discussed the limitations of telemedicine and the availability of in person appointments.  Discussed there is a possibility of technology failure and discussed alternative modes of communication if that failure occurs. ? ?I discussed that engaging in this telemedicine visit, they consent to the provision of behavioral healthcare and the services will be billed under their insurance. ? ?Patient and/or legal guardian expressed understanding and consented to Telemedicine visit: Yes  ? ?Presenting Concerns: ?Patient and/or family reports the following symptoms/concerns: Fatigue, discomfort sleeping, needs a note for work to be able to sit down, interested in iron infusion; feels she is well-supported by friends, family and workplace, wants to initiate breastfeeding and thinking about finding a doula.  ?Duration of problem: Perinatal; Severity of problem: moderate ? ?Patient and/or Family's Strengths/Protective Factors: ?Social connections, Concrete supports in place (healthy food, safe environments, etc.), and Sense of purpose ? ?Goals Addressed: ?Patient will: ? Reduce symptoms of: anxiety and stress  ?  Increase knowledge and/or ability of: healthy habits  ? Demonstrate ability to: Increase healthy adjustment to current life circumstances ? ?Progress towards Goals: ?Ongoing ? ?Interventions: ?Interventions utilized:  Solution-Focused Strategies ?Standardized Assessments completed: Not Needed ? ?Patient and/or Family Response: Patient agrees with treatment plan. ? ? ?Assessment: ?Patient currently experiencing Adjustment disorder with anxious mood.  ? ?Patient may benefit from continued brief therapeutic interventions. ? ?Plan: ?Follow up with behavioral health clinician on : One month; Call Galia Rahm at [phone], as needed. ?Behavioral recommendations:  ?-Continue taking prenatal vitamin daily as recommended by medical provider ?-Discuss medical note for work and obtaining iron infusion with medical provider on 01/17/22 ?-Continue plan to obtain doula (begin by calling doulas on list provided by medical provider) on next day off work ?-Consider registering for and attending childbirth education class of choice at [email or web address]  ?-Enjoy time with supportive friends and family at upcoming baby shower ?Referral(s): [place] (In Clinic) ? ?I discussed the assessment and treatment plan with the patient and/or parent/guardian. They were provided an opportunity to ask questions and all were answered. They agreed with the plan and demonstrated an understanding of the instructions. ?  ?They were advised to call back or seek an in-person evaluation if the symptoms worsen or if the condition fails to improve as anticipated. ? ?Garlan Fair, LCSW ? ? ?  12/30/2021  ? 11:41 AM 12/16/2021  ?  8:50 AM 12/02/2021  ? 10:17 AM 11/14/2021  ? 12:14 PM 10/09/2021  ?  4:21 PM  ?Depression screen PHQ 2/9  ?Decreased Interest 1 0 1 1 1   ?Down, Depressed, Hopeless 1 0 1 0 1  ?PHQ - 2 Score 2 0 2 1 2   ?Altered sleeping 1 0 0 0 0  ?Tired, decreased energy 1  1 1 0 1  ?Change in appetite 0 0 1 1 0  ?Feeling  bad or failure about yourself  1 0 0 0 1  ?Trouble concentrating 1 0 0 0 0  ?Moving slowly or fidgety/restless 0 0 0 0 0  ?Suicidal thoughts 0 0 0 0 0  ?PHQ-9 Score 6 1 4 2 4   ?Difficult doing work/chores   Not difficult at all Not difficult at all   ? ? ?  12/30/2021  ? 11:42 AM 12/16/2021  ?  8:50 AM 12/02/2021  ? 10:18 AM 11/14/2021  ? 12:14 PM  ?GAD 7 : Generalized Anxiety Score  ?Nervous, Anxious, on Edge 2 1 1 1   ?Control/stop worrying 2 1 1 1   ?Worry too much - different things 2 1 1 1   ?Trouble relaxing 2 1 1 1   ?Restless 0 0 0 0  ?Easily annoyed or irritable 2 1 1 1   ?Afraid - awful might happen 2 1 1 1   ?Total GAD 7 Score 12 6 6 6   ?Anxiety Difficulty   Not difficult at all Not difficult at all  ? ? ? ?

## 2021-12-31 NOTE — Addendum Note (Signed)
Addended by: Caryl Bis on: 12/31/2021 05:31 PM ? ? Modules accepted: Orders ? ?

## 2022-01-01 ENCOUNTER — Telehealth: Payer: Self-pay | Admitting: Family Medicine

## 2022-01-01 NOTE — Telephone Encounter (Signed)
Patient called requesting test results.  

## 2022-01-01 NOTE — Telephone Encounter (Signed)
Call placed back to pt. Spoke with pt. Pt states wanting to know about TB papers and results. Pt advised results for TB are not back and will result by Monday 4/10. Pt asking for papers back today. Pt will pick up today at front office. Pt also asked about IV iron infusion that Dr Alvester Morin suggested. Pt states she will think about it, but declines at this time.  ?Pt will also pick up Rx Flagyl today for + BV on swab. Pt verbalized understanding.  ?Keano Guggenheim,RN  ?

## 2022-01-02 ENCOUNTER — Telehealth: Payer: Self-pay | Admitting: Family Medicine

## 2022-01-02 LAB — QUANTIFERON-TB GOLD PLUS
QuantiFERON Mitogen Value: >10 IU/mL
QuantiFERON Nil Value: 0.07 IU/mL
QuantiFERON TB1 Ag Value: 0.14 IU/mL
QuantiFERON TB2 Ag Value: 0.11 IU/mL
QuantiFERON-TB Gold Plus: NEGATIVE

## 2022-01-02 NOTE — Telephone Encounter (Signed)
Call placed back to pt. Spoke with pt. Pt states needing to know her results from TB testing for paperwork for her job. Pt given results per Dr Alvester Morin as seen below.  ?Federico Flake, MD  P [facility] Clinical Pool ?Patient had form for work that needed TB screening test. Testing was negative  ? ?Pt verbalized understanding and agreeable to plan of care.  ? ?Peggy Loge,RN  ?

## 2022-01-02 NOTE — Telephone Encounter (Signed)
Patient has questions about papers that are being filled out regarding tuberculosis and her physical..would like a nurse to give her a call back. ?

## 2022-01-13 ENCOUNTER — Ambulatory Visit (INDEPENDENT_AMBULATORY_CARE_PROVIDER_SITE_OTHER): Payer: Medicaid Other | Admitting: Clinical

## 2022-01-13 DIAGNOSIS — F4322 Adjustment disorder with anxiety: Secondary | ICD-10-CM | POA: Diagnosis not present

## 2022-01-13 NOTE — Patient Instructions (Signed)
Center for [facility] ?[place] ?[place], [place] [address] ?[phone] (main office) ?[phone] (Rehmat Murtagh's office) ? ?[email or web address] (register for childbirth class of choice) ?

## 2022-01-17 ENCOUNTER — Encounter: Payer: Self-pay | Admitting: Family Medicine

## 2022-01-17 ENCOUNTER — Ambulatory Visit (INDEPENDENT_AMBULATORY_CARE_PROVIDER_SITE_OTHER): Payer: Medicaid Other | Admitting: Family Medicine

## 2022-01-17 VITALS — Wt 158.0 lb

## 2022-01-17 DIAGNOSIS — Z3491 Encounter for supervision of normal pregnancy, unspecified, first trimester: Secondary | ICD-10-CM

## 2022-01-17 DIAGNOSIS — O99019 Anemia complicating pregnancy, unspecified trimester: Secondary | ICD-10-CM

## 2022-01-17 DIAGNOSIS — Z3A33 33 weeks gestation of pregnancy: Secondary | ICD-10-CM

## 2022-01-17 DIAGNOSIS — F32A Depression, unspecified: Secondary | ICD-10-CM

## 2022-01-17 DIAGNOSIS — F419 Anxiety disorder, unspecified: Secondary | ICD-10-CM

## 2022-01-17 MED ORDER — BUSPIRONE HCL 10 MG PO TABS: 10.0000 mg | ORAL_TABLET | Freq: Three times a day (TID) | ORAL | 1 refills | Status: DC | PRN

## 2022-01-17 NOTE — Progress Notes (Signed)
? ?  Subjective:  ?Melissa Guzman is a 26 y.o. G1P0000 at [redacted]w[redacted]d being seen today for ongoing prenatal care.  She is currently monitored for the following issues for this low-risk pregnancy and has Supervision of low-risk pregnancy, first trimester; Anemia during pregnancy; Alpha thalassemia silent carrier; Carrier of spinal muscular atrophy; and Anxiety and depression on their problem list. ? ?Patient reports no complaints.  Contractions: Not present. Vag. Bleeding: None.  Movement: Present. Denies leaking of fluid.  ? ?The following portions of the patient's history were reviewed and updated as appropriate: allergies, current medications, past family history, past medical history, past social history, past surgical history and problem list. Problem list updated. ? ?Objective:  ? ?Vitals:  ? 01/17/22 1009  ?Weight: 158 lb (71.7 kg)  ? ? ?Fetal Status: Fetal Heart Rate (bpm): 155 Fundal Height: 33 cm Movement: Present    ? ?General:  Alert, oriented and cooperative. Patient is in no acute distress.  ?Skin: Skin is warm and dry. No rash noted.   ?Cardiovascular: Normal heart rate noted  ?Respiratory: Normal respiratory effort, no problems with respiration noted  ?Abdomen: Soft, gravid, appropriate for gestational age. Pain/Pressure: Absent     ?Pelvic: Vag. Bleeding: None     ?Cervical exam deferred        ?Extremities: Normal range of motion.  Edema: None  ?Mental Status: Normal mood and affect. Normal behavior. Normal judgment and thought content.  ? ?Urinalysis:     ? ?Assessment and Plan:  ?Pregnancy: G1P0000 at [redacted]w[redacted]d ? ?1. Supervision of low-risk pregnancy, first trimester ?BP and FHR normal ?Given letter at her request for work accomodation to sit if she needs to ? ?2. Anemia during pregnancy ?Prefers to do IV iron, orders placed, will call to schedule ? ?3. Anxiety and depression ?Started on zoloft and hydroxizyine last visit ?Tried both but they made her feel ill (GI upset) and overall worse ?Trial buspar, accepts  follow up visit with Asher Muir ? ? ?Preterm labor symptoms and general obstetric precautions including but not limited to vaginal bleeding, contractions, leaking of fluid and fetal movement were reviewed in detail with the patient. ?Please refer to After Visit Summary for other counseling recommendations.  ?Return in 2 weeks (on 01/31/2022) for Dyad patient, ob visit. ? ? ?Venora Maples, MD ? ?

## 2022-01-17 NOTE — Patient Instructions (Signed)

## 2022-01-27 NOTE — BH Specialist Note (Signed)
Integrated Behavioral Health via Telemedicine Visit ? ?02/10/2022 ?Kery Solivan ?[id number] ? ?Number of Integrated Behavioral Health Clinician visits: 4- Fourth Visit ? ?Session Start time: 1623 ?  ?Session End time: 1651 ? ?Total time in minutes: 28 ? ? ?Referring Provider: Federico Flake, MD ?Patient/Family location: [place] ?[facility] Provider location: [facility] [employer] [facility] ? ?All persons participating in visit: Patient Brandis Wixted and [facility] Elaina Cara  ? ?Types of Service: Individual psychotherapy and Video visit ? ?I connected with Afrah Shortridge and/or Fabienne Viverette's  n/a  via  Telephone or Video Enabled Telemedicine Application  (Video is Caregility application) and verified that I am speaking with the correct person using two identifiers. Discussed confidentiality: Yes  ? ?I discussed the limitations of telemedicine and the availability of in person appointments.  Discussed there is a possibility of technology failure and discussed alternative modes of communication if that failure occurs. ? ?I discussed that engaging in this telemedicine visit, they consent to the provision of behavioral healthcare and the services will be billed under their insurance. ? ?Patient and/or legal guardian expressed understanding and consented to Telemedicine visit: Yes  ? ?Presenting Concerns: ?Patient and/or family reports the following symptoms/concerns: Preparing for baby's arrival, uncertain if it's too late to find a doula, unable to take childbirth class due to work schedule; stress of having recent Covid in pregnancy; poor appetite.  ?Duration of problem: Current pregnancy; Severity of problem: moderate ? ?Patient and/or Family's Strengths/Protective Factors: ?Social connections, Concrete supports in place (healthy food, safe environments, etc.), Sense of purpose, and Physical Health (exercise, healthy diet, medication compliance, etc.) ? ?Goals Addressed: ?Patient will: ? Reduce symptoms  of: anxiety and stress  ? Increase knowledge and/or ability of: stress reduction  ? Demonstrate ability to: Increase healthy adjustment to current life circumstances and Increase motivation to adhere to plan of care ? ?Progress towards Goals: ?Ongoing ? ?Interventions: ?Interventions utilized:  Solution-Focused Strategies ?Standardized Assessments completed: GAD-7 and PHQ 9 ? ?Patient and/or Family Response: Patient agrees with treatment plan. ? ? ?Assessment: ?Patient currently experiencing Adjustment disorder with anxious mood ? ?Patient may benefit from continued brief therapeutic interventions. ? ?Plan: ?Follow up with behavioral health clinician on : Two weeks ?Behavioral recommendations:  ?-Continue taking prenatal vitamin daily as recommended ?-Consider taking childbirth class of choice at [email or web address] ?-Consider contacting doulas of choice from doula list (on After Visit Summary) ?-Begin packing bag for hospital stay (list of recommended items to bring on [email or web address]) ?-Read through Postpartum Planner (on After Visit Summary) ?Referral(s): Integrated [employer] (In Clinic) ? ?I discussed the assessment and treatment plan with the patient and/or parent/guardian. They were provided an opportunity to ask questions and all were answered. They agreed with the plan and demonstrated an understanding of the instructions. ?  ?They were advised to call back or seek an in-person evaluation if the symptoms worsen or if the condition fails to improve as anticipated. ? ?Rae Lips, LCSW ? ? ?  02/10/2022  ?  4:31 PM 12/30/2021  ? 11:41 AM 12/16/2021  ?  8:50 AM 12/02/2021  ? 10:17 AM 11/14/2021  ? 12:14 PM  ?Depression screen PHQ 2/9  ?Decreased Interest 1 1 0 1 1  ?Down, Depressed, Hopeless 0 1 0 1 0  ?PHQ - 2 Score 1 2 0 2 1  ?Altered sleeping 0 1 0 0 0  ?Tired, decreased energy 1 1 1 1  0  ?Change in appetite 3 0 0 1  1  ?Feeling bad or failure about yourself  0 1 0 0 0  ?Trouble  concentrating 0 1 0 0 0  ?Moving slowly or fidgety/restless 0 0 0 0 0  ?Suicidal thoughts 0 0 0 0 0  ?PHQ-9 Score 5 6 1 4 2   ?Difficult doing work/chores    Not difficult at all Not difficult at all  ? ? ?  02/10/2022  ?  4:35 PM 12/30/2021  ? 11:42 AM 12/16/2021  ?  8:50 AM 12/02/2021  ? 10:18 AM  ?GAD 7 : Generalized Anxiety Score  ?Nervous, Anxious, on Edge 1 2 1 1   ?Control/stop worrying 1 2 1 1   ?Worry too much - different things 1 2 1 1   ?Trouble relaxing 0 2 1 1   ?Restless 0 0 0 0  ?Easily annoyed or irritable 1 2 1 1   ?Afraid - awful might happen 0 2 1 1   ?Total GAD 7 Score 4 12 6 6   ?Anxiety Difficulty    Not difficult at all  ? ? ? ? ? ?

## 2022-01-28 ENCOUNTER — Ambulatory Visit (HOSPITAL_COMMUNITY)
Admission: EM | Admit: 2022-01-28 | Discharge: 2022-01-28 | Disposition: A | Discharge status: Home / Self Care | Payer: Medicaid Other | Attending: Nurse Practitioner | Admitting: Nurse Practitioner

## 2022-01-28 ENCOUNTER — Encounter (HOSPITAL_COMMUNITY): Payer: Self-pay

## 2022-01-28 DIAGNOSIS — O98519 Other viral diseases complicating pregnancy, unspecified trimester: Secondary | ICD-10-CM | POA: Insufficient documentation

## 2022-01-28 DIAGNOSIS — Z3A00 Weeks of gestation of pregnancy not specified: Secondary | ICD-10-CM | POA: Diagnosis not present

## 2022-01-28 DIAGNOSIS — O26899 Other specified pregnancy related conditions, unspecified trimester: Secondary | ICD-10-CM | POA: Diagnosis present

## 2022-01-28 DIAGNOSIS — O99519 Diseases of the respiratory system complicating pregnancy, unspecified trimester: Secondary | ICD-10-CM | POA: Insufficient documentation

## 2022-01-28 DIAGNOSIS — R059 Cough, unspecified: Secondary | ICD-10-CM | POA: Diagnosis not present

## 2022-01-28 DIAGNOSIS — J069 Acute upper respiratory infection, unspecified: Secondary | ICD-10-CM

## 2022-01-28 DIAGNOSIS — J029 Acute pharyngitis, unspecified: Secondary | ICD-10-CM | POA: Diagnosis not present

## 2022-01-28 DIAGNOSIS — U071 COVID-19: Secondary | ICD-10-CM | POA: Insufficient documentation

## 2022-01-28 LAB — POCT RAPID STREP A, ED / UC: Streptococcus, Group A Screen (Direct): NEGATIVE

## 2022-01-28 NOTE — ED Provider Notes (Signed)
?[facility] ? ? ? ?CSN: [id number] ?Arrival date & time: 01/28/22  1151 ? ? ?  ? ?History   ?Chief Complaint ?Chief Complaint  ?Patient presents with  ? Cough  ? ? ?HPI ?Melissa Guzman is a 26 y.o. female.  ? ?Patient presents with 4 days of cough, congestion, body aches/fevers, sore throat, headache.  Denies chest pain, shortness of breath, wheezing, or chest tightness.  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  She has not been taking anything for her symptoms as she is pregnant does not know what she can take. ? ? ?History reviewed. No pertinent past medical history. ? ?Patient Active Problem List  ? Diagnosis Date Noted  ? Anxiety and depression 01/17/2022  ? Alpha thalassemia silent carrier 09/26/2021  ? Carrier of spinal muscular atrophy 09/26/2021  ? Anemia during pregnancy 09/06/2021  ? Supervision of low-risk pregnancy, first trimester 08/01/2021  ? ? ?History reviewed. No pertinent surgical history. ? ?OB History   ? ? Gravida  ?1  ? Para  ?0  ? Term  ?0  ? Preterm  ?0  ? AB  ?0  ? Living  ?0  ?  ? ? SAB  ?0  ? IAB  ?0  ? Ectopic  ?0  ? Multiple  ?0  ? Live Births  ?0  ?   ?  ?  ? ? ? ?Home Medications   ? ?Prior to Admission medications   ?Medication Sig Start Date End Date Taking? Authorizing Provider  ?busPIRone (BUSPAR) 10 MG tablet Take 1 tablet (10 mg total) by mouth 3 (three) times daily as needed (anxiety). 01/17/22   Venora MaplesEckstat, Matthew M, MD  ?Elastic Bandages & Supports (COMFORT FIT MATERNITY SUPP MED) MISC 1 Units by Does not apply route daily. ?Patient not taking: Reported on 12/16/2021 10/09/21   Federico FlakeNewton, Kimberly Niles, MD  ?ferrous sulfate (FERROUSUL) 325 (65 FE) MG tablet Take 1 tablet (325 mg total) by mouth every other day. 09/26/21   Federico FlakeNewton, Kimberly Niles, MD  ?ondansetron (ZOFRAN-ODT) 4 MG disintegrating tablet Take 1 tablet (4 mg total) by mouth every 6 (six) hours as needed for nausea. Take before your next  glucose text, about 30 minutes before arrival 12/16/21   Federico FlakeNewton, Kimberly Niles, MD  ?Prenatal Vit-Fe Fumarate-FA (PREPLUS) 27-1 MG TABS Take 1 tablet by mouth daily.    [provider]  ? ? ?Family History ?History reviewed. No pertinent family history. ? ?Social History ?Social History  ? ?Tobacco Use  ? Smoking status: Never  ? Smokeless tobacco: Never  ?Vaping Use  ? Vaping Use: Never used  ?Substance Use Topics  ? Alcohol use: Not Currently  ? Drug use: No  ? ? ? ?Allergies   ?Patient has no known allergies. ? ? ?Review of Systems ?Review of Systems ?Per HPI ? ?Physical Exam ?Triage Vital Signs ?ED Triage Vitals  ?Enc Vitals Group  ?   BP 01/28/22 1225 130/78  ?   Pulse Rate 01/28/22 1225 (!) 101  ?   Resp 01/28/22 1225 18  ?   Temp 01/28/22 1225 97.6 ?F (36.4 ?C)  ?   Temp Source 01/28/22 1225 Oral  ?   SpO2 01/28/22 1225 96 %  ?   Weight --   ?   Height --   ?   Head Circumference --   ?   Peak Flow --   ?  Pain Score 01/28/22 1221 0  ?   Pain Loc --   ?   Pain Edu? --   ?   Excl. in GC? --   ? ?No data found. ? ?Updated Vital Signs ?BP 130/78 (BP Location: Left Arm)   Pulse (!) 101   Temp 97.6 ?F (36.4 ?C) (Oral)   Resp 18   LMP 05/27/2021   SpO2 96%  ? ?Visual Acuity ?Right Eye Distance:   ?Left Eye Distance:   ?Bilateral Distance:   ? ?Right Eye Near:   ?Left Eye Near:    ?Bilateral Near:    ? ?Physical Exam ?Vitals and nursing note reviewed.  ?Constitutional:   ?   General: She is not in acute distress. ?   Appearance: Normal appearance. She is not ill-appearing or toxic-appearing.  ?HENT:  ?   Head: Normocephalic and atraumatic.  ?   Right Ear: Tympanic membrane, ear canal and external ear normal.  ?   Left Ear: Tympanic membrane, ear canal and external ear normal.  ?   Nose: Congestion and rhinorrhea present.  ?   Mouth/Throat:  ?   Mouth: Mucous membranes are moist.  ?   Pharynx: Oropharynx is clear. Posterior oropharyngeal erythema present. No oropharyngeal exudate.  ?   Tonsils: No  tonsillar exudate. 2+ on the right. 2+ on the left.  ?Eyes:  ?   General: No scleral icterus. ?   Extraocular Movements: Extraocular movements intact.  ?Cardiovascular:  ?   Rate and Rhythm: Regular rhythm. Tachycardia present.  ?Pulmonary:  ?   Effort: Pulmonary effort is normal. No respiratory distress.  ?   Breath sounds: Normal breath sounds. No wheezing, rhonchi or rales.  ?Abdominal:  ?   Comments: Gravid  ?Musculoskeletal:  ?   Cervical back: Normal range of motion and neck supple.  ?Lymphadenopathy:  ?   Cervical: No cervical adenopathy.  ?Skin: ?   General: Skin is warm and dry.  ?   Capillary Refill: Capillary refill takes less than 2 seconds.  ?   Coloration: Skin is not jaundiced or pale.  ?   Findings: No erythema or rash.  ?Neurological:  ?   Mental Status: She is alert and oriented to person, place, and time.  ?   Motor: No weakness.  ?Psychiatric:     ?   Mood and Affect: Mood normal.     ?   Behavior: Behavior normal.  ? ? ? ?UC Treatments / Results  ?Labs ?(all labs ordered are listed, but only abnormal results are displayed) ?Labs Reviewed  ?CULTURE, GROUP A STREP [facility])  ?SARS CORONAVIRUS 2 (TAT 6-24 HRS)  ?POCT RAPID STREP A, ED / UC  ? ? ?EKG ? ? ?Radiology ?No results found. ? ?Procedures ?Procedures (including critical care time) ? ?Medications Ordered in UC ?Medications - No data to display ? ?Initial Impression / Assessment and Plan / UC Course  ?I have reviewed the triage vital signs and the nursing notes. ? ?Pertinent labs & imaging results that were available during my care of the patient were reviewed by me and considered in my medical decision making (see chart for details). ? ?  ? ?Reassured patient that symptoms and exam findings are most consistent with a viral upper respiratory infection.  Obtain COVID-19 and rapid strep throat testing given enlarged tonsils on exam.  Discussed expected course and features suggestive of secondary bacterial infection.  Continue supportive care.  Increase fluid intake with water or electrolyte solution like pedialyte.  Encouraged acetaminophen as needed for fever/pain.  Encouraged saline sinus flushes and/or neti with humidified air.  Seek care if symptoms persist more than 7-10 days without improvement of if symptoms worsen. ? ?Final Clinical Impressions(s) / UC Diagnoses  ? ?Final diagnoses:  ?Viral URI with cough  ?Acute pharyngitis, unspecified etiology  ? ? ? ?Discharge Instructions   ? ?  ?Your symptoms and exam findings are most consistent with a viral upper respiratory infection. These usually run their course in about 10 days.  If your symptoms last longer than 10 days without improvement, please follow up with your primary care provider.  If your symptoms, worsen, please go to the Emergency Room.   ? ?We have tested you today for strep throat and COVID-19 today.  You will see the results in Mychart and we will call you with positive results.    Please stay home and isolate until you are aware of the results.   ? ?Some things that can make you feel better are: ?- Increased rest ?- Increasing fluid with water/sugar free electrolytes ?- Acetaminophen as needed for fever/pain.  ?- Saline sinus flushes or a neti pot.  ?- Humidifying the air. ? ? ? ? ? ?ED Prescriptions   ?None ?  ? ?PDMP not reviewed this encounter. ?  ?Valentino Nose, NP ?01/28/22 1324 ? ?

## 2022-01-28 NOTE — ED Triage Notes (Signed)
Pt c/o cough, sore throat, sneezing, and nasal/chest congestion since Friday. Pt is pregnant and hasn't taking anything.  ?

## 2022-01-28 NOTE — Discharge Instructions (Addendum)
Your symptoms and exam findings are most consistent with a viral upper respiratory infection. These usually run their course in about 10 days.  If your symptoms last longer than 10 days without improvement, please follow up with your primary care provider.  If your symptoms, worsen, please go to the Emergency Room.   ? ?We have tested you today for strep throat and COVID-19 today.  You will see the results in Mychart and we will call you with positive results.    Please stay home and isolate until you are aware of the results.   ? ?Some things that can make you feel better are: ?- Increased rest ?- Increasing fluid with water/sugar free electrolytes ?- Acetaminophen as needed for fever/pain.  ?- Saline sinus flushes or a neti pot.  ?- Humidifying the air. ? ?

## 2022-01-29 LAB — SARS CORONAVIRUS 2 (TAT 6-24 HRS): SARS Coronavirus 2: POSITIVE — AB

## 2022-02-01 LAB — CULTURE, GROUP A STREP (THRC)

## 2022-02-04 ENCOUNTER — Ambulatory Visit (INDEPENDENT_AMBULATORY_CARE_PROVIDER_SITE_OTHER): Payer: Medicaid Other | Admitting: Family Medicine

## 2022-02-04 ENCOUNTER — Ambulatory Visit (HOSPITAL_COMMUNITY)
Admission: RE | Admit: 2022-02-04 | Discharge: 2022-02-04 | Disposition: A | Discharge status: Home / Self Care | Payer: Medicaid Other | Source: Ambulatory Visit | Attending: Family Medicine | Admitting: Family Medicine

## 2022-02-04 ENCOUNTER — Other Ambulatory Visit (HOSPITAL_COMMUNITY)
Admission: RE | Admit: 2022-02-04 | Discharge: 2022-02-04 | Disposition: A | Discharge status: Home / Self Care | Payer: Medicaid Other | Source: Ambulatory Visit | Attending: Family Medicine | Admitting: Family Medicine

## 2022-02-04 ENCOUNTER — Encounter: Payer: Self-pay | Admitting: Family Medicine

## 2022-02-04 VITALS — BP 117/78 | HR 73 | Wt 161.0 lb

## 2022-02-04 DIAGNOSIS — Z3491 Encounter for supervision of normal pregnancy, unspecified, first trimester: Secondary | ICD-10-CM

## 2022-02-04 DIAGNOSIS — Z3A36 36 weeks gestation of pregnancy: Secondary | ICD-10-CM | POA: Insufficient documentation

## 2022-02-04 DIAGNOSIS — O98513 Other viral diseases complicating pregnancy, third trimester: Secondary | ICD-10-CM

## 2022-02-04 DIAGNOSIS — U071 COVID-19: Secondary | ICD-10-CM

## 2022-02-04 DIAGNOSIS — O99013 Anemia complicating pregnancy, third trimester: Secondary | ICD-10-CM | POA: Diagnosis not present

## 2022-02-04 DIAGNOSIS — F32A Depression, unspecified: Secondary | ICD-10-CM

## 2022-02-04 DIAGNOSIS — F419 Anxiety disorder, unspecified: Secondary | ICD-10-CM

## 2022-02-04 DIAGNOSIS — O99019 Anemia complicating pregnancy, unspecified trimester: Secondary | ICD-10-CM

## 2022-02-04 HISTORY — DX: Other viral diseases complicating pregnancy, third trimester: O98.513

## 2022-02-04 MED ORDER — SODIUM CHLORIDE 0.9 % IV SOLN
500.0000 mg | Freq: Once | INTRAVENOUS | Status: AC
  Administered 2022-02-04: 500 mg via INTRAVENOUS
  Filled 2022-02-04: qty 25

## 2022-02-04 MED ORDER — ALBUTEROL SULFATE (2.5 MG/3ML) 0.083% IN NEBU
2.5000 mg | INHALATION_SOLUTION | Freq: Once | RESPIRATORY_TRACT | Status: DC | PRN
Start: 2022-02-04 — End: 2022-02-05

## 2022-02-04 MED ORDER — EPINEPHRINE PF 1 MG/ML IJ SOLN: 0.3000 mg | Freq: Once | INTRAMUSCULAR | Status: DC | PRN

## 2022-02-04 MED ORDER — DIPHENHYDRAMINE HCL 50 MG/ML IJ SOLN: 25.0000 mg | Freq: Once | INTRAMUSCULAR | Status: DC | PRN

## 2022-02-04 MED ORDER — SODIUM CHLORIDE 0.9 % IV BOLUS
500.0000 mL | Freq: Once | INTRAVENOUS | Status: DC | PRN
Start: 2022-02-04 — End: 2022-02-05

## 2022-02-04 MED ORDER — SODIUM CHLORIDE 0.9 % IV SOLN: INTRAVENOUS | Status: DC | PRN

## 2022-02-04 MED ORDER — METHYLPREDNISOLONE SODIUM SUCC 125 MG IJ SOLR: 125.0000 mg | Freq: Once | INTRAMUSCULAR | Status: DC | PRN

## 2022-02-04 NOTE — Progress Notes (Signed)
Upon DC pt stated she had a little pain in her lower abdomen.  She denies any other symptoms.  I asked if this was a tolerable pain or something she thinks she needs to be seen for.  She said tolerable.  I told her if the pain got worse or didn't go away she needed to contact her Dr and pt verbalized understanding  ?

## 2022-02-04 NOTE — Patient Instructions (Signed)

## 2022-02-04 NOTE — Progress Notes (Signed)
? ?  Subjective:  ?Melissa Guzman is a 26 y.o. G1P0000 at [redacted]w[redacted]d being seen today for ongoing prenatal care.  She is currently monitored for the following issues for this low-risk pregnancy and has Supervision of low-risk pregnancy, first trimester; Anemia during pregnancy; Alpha thalassemia silent carrier; Carrier of spinal muscular atrophy; Anxiety and depression; and COVID-19 affecting pregnancy in third trimester on their problem list. ? ?Patient reports no complaints.  Contractions: Not present. Vag. Bleeding: None.  Movement: Present. Denies leaking of fluid.  ? ?The following portions of the patient's history were reviewed and updated as appropriate: allergies, current medications, past family history, past medical history, past social history, past surgical history and problem list. Problem list updated. ? ?Objective:  ? ?Vitals:  ? 02/04/22 1425  ?BP: 117/78  ?Pulse: 73  ?Weight: 161 lb (73 kg)  ? ? ?Fetal Status: Fetal Heart Rate (bpm): 163   Movement: Present  Presentation: Vertex ? ?General:  Alert, oriented and cooperative. Patient is in no acute distress.  ?Skin: Skin is warm and dry. No rash noted.   ?Cardiovascular: Normal heart rate noted  ?Respiratory: Normal respiratory effort, no problems with respiration noted  ?Abdomen: Soft, gravid, appropriate for gestational age. Pain/Pressure: Absent     ?Pelvic: Vag. Bleeding: None     ?Cervical exam performed Dilation: Closed Effacement (%): Thick Station: -3  ?Extremities: Normal range of motion.  Edema: None  ?Mental Status: Normal mood and affect. Normal behavior. Normal judgment and thought content.  ? ?Urinalysis:     ? ?Assessment and Plan:  ?Pregnancy: G1P0000 at [redacted]w[redacted]d ? ?1. Supervision of low-risk pregnancy, first trimester ?BP and FHR normal ?Cervix still closed, baby vertex ? ?2. Anemia during pregnancy ?S/p IV venofer ?Skin feels hot after infusion today, done at 1300 ?No trouble breathing, no nausea, no rash ?No signs of anaphylaxis or other serious  reaction ? ?3. Anxiety and depression ?Trial of buspar after last visit, reports she has not picked it up ?Declines follow up w Asher Muir ? ?4. Covid-19 affecting pregnancy in third trimester ?Diagnosed 01/28/2022, had several days of symptoms prior ?Symptoms improving ? ?Preterm labor symptoms and general obstetric precautions including but not limited to vaginal bleeding, contractions, leaking of fluid and fetal movement were reviewed in detail with the patient. ?Please refer to After Visit Summary for other counseling recommendations.  ?Return in 1 week (on 02/11/2022) for Dy[facility] patient, ob visit. ? ? ?Venora Maples, MD ? ?

## 2022-02-05 LAB — GC/CHLAMYDIA PROBE AMP (~~LOC~~) NOT AT ARMC
Chlamydia: NEGATIVE
Comment: NEGATIVE
Comment: NORMAL
Neisseria Gonorrhea: NEGATIVE

## 2022-02-08 LAB — CULTURE, BETA STREP (GROUP B ONLY): Strep Gp B Culture: NEGATIVE

## 2022-02-10 ENCOUNTER — Ambulatory Visit (INDEPENDENT_AMBULATORY_CARE_PROVIDER_SITE_OTHER): Payer: Medicaid Other | Admitting: Clinical

## 2022-02-10 DIAGNOSIS — F4322 Adjustment disorder with anxiety: Secondary | ICD-10-CM

## 2022-02-10 NOTE — Patient Instructions (Addendum)
[facility] for [employer] [facility] at [facility] ?[address] ?[place], [place] [address] ?[phone] (main office) ?[phone] (Kaziyah Parkison's office) ? ?[email or web address] ? ? ?DOULA LIST  ? ?[facility] Doula  Blanca  [phone]  [email or web address]  ?[email or web address]  ?Zula the [employer] [phone]  [email or web address]   ?[employer]   Precious Danford Bad   [email or web address]   ???THE MOTHERLY DOULA?? Zola Button   [phone]   [email or web address] .[email or web address]   ?  ?The Abundant Life Doula  Olive Bass  [phone]    [email or web address] ?[email or web address]   ?[facility]oula [facility]  Angie Rosier     [phone]     [email or web address]   ?Renato Gails: Doula & Photographer   Renato Gails [phone]       [email or web address]  [email or web address]   ?[facility] Doula Services  [place] [phone]   [email or web address]   ?[address] [place], [place]  Tiffany Lorenda Hatchet  [phone]  [email or web address]   ?[email or web address]   ?[facility]   Kizzie Furnish   [phone]  [email or web address] ?[email or web address]   ?Dina Rich [place] Doula  Dina Rich  [phone] [email or web address] ?[email or web address]  ?Natural [facility] Doulas  Jeneen Rinks           ?Eileen Stanford         ?Theodora Blow       ?Mervyn Gay Reynolds     [phone] [email or web address]  [email or web address]   ?[facility]   ?Raynelle Highland [phone] [email or web address]   ?[facility] Doula Services  Camelia Eng     [phone]  [email or web address] ?[email or web address]  ?[facility]     [phone]  [email or web address]  ?Facebook and IG [email or web address]  ?  [phone] [email or web address]   ? [phone] [email or web address]  ?  [phone]  [phone] [email or web address] .[email or web address]    ? [facility]  [phone] [email or web address]   ?IG [email or web address]   ? [phone]    [phone] [email or web address]  ? Lurline Hare" Free  [phone] [email or web address]   ? Mtende Roll  [phone] [email or web address]  ? Susie Williams   [email or web address]   ? [phone]    [phone] [email or web address]    ? Denita Lung  [phone] [email or web address]   ? Lenard Galloway  [phone] [email or web address] ?[email or web address]   ? Shayla Rhem    [phone]   ?Baby on the Brain [phone]  [phone] [email or web address] ?[email or web address]  ?Doula Mama [phone] [email or web address] ?Maryjean Ka  ?Baby on the Brain [phone]  [phone] [email or web address] ?[email or web address]  ?[facility] [phone]  [email or web address]  ?[email or web address]  ? Enzo Montgomery Harris-Jones  [phone] [email or web address]  ? Dickie La [phone] [email or web address] .Ardine Eng  ? [phone] [email or web address]  ? Gilda Crease  [phone] [email or web address]  ?[employer]  Jonelle Sports  [phone] [email or web address]  ? Jackelyn Poling [phone] [email or web address]  ?MOOR Divine Myeka Dunn  [email or web address]  ? Cristina Gong [phone] [email or web address]  ? Glory Buff [phone] [email or web address]  ? Juante Randleman [phone] [email or web address]  ? ? ?  ? ? ? ?BRAINSTORMING ? ?Develop a Plan ?Goals: ?Provide a way to start conversation about your new life with a baby ?Assist parents in recognizing and using resources within their reach ?Help pave the way before birth for an easier period of transition afterwards. ? ?Make a list of the following information to keep in a central location: ?Full name of Mom and Partner:  _____________________________________________ ?Baby's full name and Date of Birth: ___________________________________________ ?Home Address: ___________________________________________________________ ?________________________________________________________________________ ?Home Phone: ____________________________________________________________ ?Parents' cell numbers: _____________________________________________________ ?________________________________________________________________________ ?Name and contact info for  OB: ______________________________________________ ?Name and contact info for Pediatrician:________________________________________ ?Contact info for Lactation Consultants: ________________________________________ ? ?REST and SLEEP ?*You each need at least 4-5 hours of uninterrupted sleep every day. Write specific names and contact information.* ?How are you going to rest in the postpartum period? While partner's home? When partner returns to work? When you both return to work? ?Where will your baby sleep? ?Who is available to help during the day? Evening? Night? ?Who could move in for a period to help support you? ?What are some ideas to help you get enough sleep? ?__________________________________________________________________________________________________________________________________________________________________________________________________________________________________________ ?NUTRITIOUS FOOD AND DRINK ?*Plan for meals before your baby is born so you can have healthy food to eat during the immediate postpartum period.* ?Who will look after breakfast? Lunch? Dinner? List names and contact information. Brainstorm quick, healthy ideas for each meal. ?What can you do before baby is born to prepare meals for the postpartum period? ?How can others help you with meals? ?Which grocery stores provide online shopping and delivery? ?Which restaurants offer take-out or delivery  options? ?______________________________________________________________________________________________________________________________________________________________________________________________________________________________________________________________________________________________________________________________________________________________________________________________________ ? ?CARE FOR MOM ?*It's important that mom is cared for and pampered in the postpartum period. Remember, the most important ways new mothers need care are: sleep, nutrition, gentle exercise, and time off.* ?Who can come take care of mom during this period? Make a list of people with their contact information. ?List some activities that make you feel cared for, rested, and energized? Who can make sure you have opportunities to do these things? ?Does mom have a space of her very own within your home that's just for her? Make a ?[facility]? where she can be comfortable, rest, and renew herself daily. ?______________________________________________________________________________________________________________________________________________________________________________________________________________________________________________________________________________________________________________________________________________________________________________________________________ ? ? ? ?CARE FOR AND FEEDING BABY ?*Knowledgeable and encouraging people will offer the best support with regard to feeding your baby.* ?Educate yourself and choose the best feeding option for your baby. ?Make a list of people who will guide, support, and be a resource for you as your care for and feed your baby. (Friends that have breastfed or are currently breastfeeding, lactation consultants, breastfeeding support groups, etc.) ?Consider a postpartum doula. (These websites can give you information: dona.org & https://shea.org/) ?Seek out local  breastfeeding resources like the breastfeeding support group at [employer] or [employer]. ?_______________________________________________________________________________________________________________________________________________________

## 2022-02-11 NOTE — BH Specialist Note (Signed)
Pt did not arrive to video visit and did not answer the phone; Left HIPPA-compliant message to call back Charda Janis from [facility] at  [phone] (Nariya Neumeyer's office).  ?; left MyChart message for patient.  ? ?

## 2022-02-13 ENCOUNTER — Encounter: Payer: Self-pay | Admitting: Family Medicine

## 2022-02-13 ENCOUNTER — Ambulatory Visit (INDEPENDENT_AMBULATORY_CARE_PROVIDER_SITE_OTHER): Payer: Medicaid Other | Admitting: Family Medicine

## 2022-02-13 VITALS — BP 119/81 | HR 97 | Wt 167.4 lb

## 2022-02-13 DIAGNOSIS — O99019 Anemia complicating pregnancy, unspecified trimester: Secondary | ICD-10-CM

## 2022-02-13 DIAGNOSIS — Z3491 Encounter for supervision of normal pregnancy, unspecified, first trimester: Secondary | ICD-10-CM

## 2022-02-13 LAB — CBC
Hematocrit: 27.7 % — ABNORMAL LOW (ref 34.0–46.6)
Hemoglobin: 9.5 g/dL — ABNORMAL LOW (ref 11.1–15.9)
MCH: 28.7 pg (ref 26.6–33.0)
MCHC: 34.3 g/dL (ref 31.5–35.7)
MCV: 84 fL (ref 79–97)
Platelets: 338 10*3/uL (ref 150–450)
RBC: 3.31 x10E6/uL — ABNORMAL LOW (ref 3.77–5.28)
RDW: 11.5 % — ABNORMAL LOW (ref 11.7–15.4)
WBC: 8.4 10*3/uL (ref 3.4–10.8)

## 2022-02-13 NOTE — Progress Notes (Signed)
   PRENATAL VISIT NOTE  Subjective:  Melissa Guzman is a 26 y.o. G1P0000 at [redacted]w[redacted]d being seen today for ongoing prenatal care.  She is currently monitored for the following issues for this low-risk pregnancy and has Supervision of low-risk pregnancy, first trimester; Anemia during pregnancy; Alpha thalassemia silent carrier; Carrier of spinal muscular atrophy; Anxiety and depression; and COVID-19 affecting pregnancy in third trimester on their problem list.  Patient reports no complaints.  Contractions: Not present. Vag. Bleeding: None.  Movement: Present. Denies leaking of fluid.   The following portions of the patient's history were reviewed and updated as appropriate: allergies, current medications, past family history, past medical history, past social history, past surgical history and problem list.   Objective:   Vitals:   02/13/22 1058  BP: 119/81  Pulse: 97  Weight: 167 lb 6.4 oz (75.9 kg)    Fetal Status: Fetal Heart Rate (bpm): 158   Movement: Present     General:  Alert, oriented and cooperative. Patient is in no acute distress.  Skin: Skin is warm and dry. No rash noted.   Cardiovascular: Normal heart rate noted  Respiratory: Normal respiratory effort, no problems with respiration noted  Abdomen: Soft, gravid, appropriate for gestational age.  Pain/Pressure: Absent     Pelvic: Cervical exam deferred        Extremities: Normal range of motion.  Edema: Trace  Mental Status: Normal mood and affect. Normal behavior. Normal judgment and thought content.   Assessment and Plan:  Pregnancy: G1P0000 at [redacted]w[redacted]d 1. Supervision of low-risk pregnancy, first trimester Up to date Doing well Wants to know plan with anemia-- reviewed hgb check today  2. Anemia during pregnancy S/p infusion on 5/9 HGB today   Term labor symptoms and general obstetric precautions including but not limited to vaginal bleeding, contractions, leaking of fluid and fetal movement were reviewed in detail with the  patient. Please refer to After Visit Summary for other counseling recommendations.   Return in about 1 week (around 02/20/2022) for Routine prenatal care, Mom+Baby Combined Care.  Future Appointments  Date Time Provider [place]  02/21/2022  9:55 AM Clarnce Flock, MD [facility]  02/25/2022  3:45 PM [facility]  02/26/2022  2:35 PM Helaine Chess [facility]  03/05/2022  9:15 AM [facility]  03/05/2022 10:35 AM Dione Plover, Annice Needy, MD [facility]    Caren Macadam, MD

## 2022-02-13 NOTE — Progress Notes (Signed)
Patient in for routine prenatal visit, no issues or concerns at this time. FHR 158, reports good fetal movement.   Swabs collected at last visit, all negative.   Wynona Canes, CMA

## 2022-02-21 ENCOUNTER — Ambulatory Visit (INDEPENDENT_AMBULATORY_CARE_PROVIDER_SITE_OTHER): Payer: Medicaid Other | Admitting: Family Medicine

## 2022-02-21 ENCOUNTER — Encounter: Payer: Self-pay | Admitting: Family Medicine

## 2022-02-21 VITALS — BP 114/75 | HR 77 | Wt 163.5 lb

## 2022-02-21 DIAGNOSIS — Z3491 Encounter for supervision of normal pregnancy, unspecified, first trimester: Secondary | ICD-10-CM

## 2022-02-21 DIAGNOSIS — O99019 Anemia complicating pregnancy, unspecified trimester: Secondary | ICD-10-CM

## 2022-02-21 NOTE — Patient Instructions (Signed)

## 2022-02-21 NOTE — Progress Notes (Signed)
   Subjective:  Melissa Guzman is a 26 y.o. G1P0000 at [redacted]w[redacted]d being seen today for ongoing prenatal care.  She is currently monitored for the following issues for this low-risk pregnancy and has Supervision of low-risk pregnancy, first trimester; Anemia during pregnancy; Alpha thalassemia silent carrier; Carrier of spinal muscular atrophy; Anxiety and depression; and COVID-19 affecting pregnancy in third trimester on their problem list.  Patient reports no complaints.  Contractions: Irritability. Vag. Bleeding: None.  Movement: Present. Denies leaking of fluid.   The following portions of the patient's history were reviewed and updated as appropriate: allergies, current medications, past family history, past medical history, past social history, past surgical history and problem list. Problem list updated.  Objective:   Vitals:   02/21/22 1017  BP: 114/75  Pulse: 77  Weight: 163 lb 8 oz (74.2 kg)    Fetal Status: Fetal Heart Rate (bpm): 153   Movement: Present  Presentation: Vertex  General:  Alert, oriented and cooperative. Patient is in no acute distress.  Skin: Skin is warm and dry. No rash noted.   Cardiovascular: Normal heart rate noted  Respiratory: Normal respiratory effort, no problems with respiration noted  Abdomen: Soft, gravid, appropriate for gestational age. Pain/Pressure: Absent     Pelvic: Vag. Bleeding: None     Cervical exam deferred Dilation: Fingertip Effacement (%): 50 Station: -2  Extremities: Normal range of motion.  Edema: Trace  Mental Status: Normal mood and affect. Normal behavior. Normal judgment and thought content.   Urinalysis:      Assessment and Plan:  Pregnancy: G1P0000 at [redacted]w[redacted]d  1. Supervision of low-risk pregnancy, first trimester BP and FHR normal  2. Anemia during pregnancy Lab Results  Component Value Date   HGB 9.5 (L) 02/13/2022   S/p IV iron  Term labor symptoms and general obstetric precautions including but not limited to vaginal  bleeding, contractions, leaking of fluid and fetal movement were reviewed in detail with the patient. Please refer to After Visit Summary for other counseling recommendations.  Return in 1 week (on 02/28/2022) for [facility] patient, ob visit.   Venora Maples, MD

## 2022-02-25 ENCOUNTER — Ambulatory Visit: Payer: Medicaid Other | Admitting: Clinical

## 2022-02-25 DIAGNOSIS — Z91199 Patient's noncompliance with other medical treatment and regimen due to unspecified reason: Secondary | ICD-10-CM

## 2022-02-26 ENCOUNTER — Ambulatory Visit (INDEPENDENT_AMBULATORY_CARE_PROVIDER_SITE_OTHER): Payer: Medicaid Other | Admitting: Certified Nurse Midwife

## 2022-02-26 VITALS — BP 120/80 | HR 92 | Wt 163.2 lb

## 2022-02-26 DIAGNOSIS — O99019 Anemia complicating pregnancy, unspecified trimester: Secondary | ICD-10-CM

## 2022-02-26 DIAGNOSIS — Z3403 Encounter for supervision of normal first pregnancy, third trimester: Secondary | ICD-10-CM

## 2022-02-26 DIAGNOSIS — Z3A39 39 weeks gestation of pregnancy: Secondary | ICD-10-CM

## 2022-02-26 NOTE — Progress Notes (Signed)
   PRENATAL VISIT NOTE  Subjective:  Melissa Guzman is a 26 y.o. G1P0000 at [redacted]w[redacted]d being seen today for ongoing prenatal care.  She is currently monitored for the following issues for this low-risk pregnancy and has Supervision of low-risk pregnancy, first trimester; Anemia during pregnancy; Alpha thalassemia silent carrier; Carrier of spinal muscular atrophy; Anxiety and depression; and COVID-19 affecting pregnancy in third trimester on their problem list.  Patient reports no complaints.  Contractions: Irritability. Vag. Bleeding: None.  Movement: Present. Denies leaking of fluid.   The following portions of the patient's history were reviewed and updated as appropriate: allergies, current medications, past family history, past medical history, past social history, past surgical history and problem list.   Objective:   Vitals:   02/26/22 1449  BP: 120/80  Pulse: 92  Weight: 163 lb 3.2 oz (74 kg)    Fetal Status: Fetal Heart Rate (bpm): 135 Fundal Height: 36 cm Movement: Present  Presentation: Vertex  General:  Alert, oriented and cooperative. Patient is in no acute distress.  Skin: Skin is warm and dry. No rash noted.   Cardiovascular: Normal heart rate noted  Respiratory: Normal respiratory effort, no problems with respiration noted  Abdomen: Soft, gravid, appropriate for gestational age.  Pain/Pressure: Present     Pelvic: Cervical exam deferred        Extremities: Normal range of motion.  Edema: None  Mental Status: Normal mood and affect. Normal behavior. Normal judgment and thought content.   Assessment and Plan:  Pregnancy: G1P0000 at [redacted]w[redacted]d 1. Encounter for supervision of low-risk first pregnancy in third trimester - Doing well, feeling regular and vigorous fetal movement  - RPR; Standing  2. [redacted] weeks gestation of pregnancy - Routine OB care - Discussed post-dates IOL, pt prefers to schedule at 41wks. IOL scheduled for 41wks, orders in. - Will get NST at next visit  3.  Anemia during pregnancy - CBC; Standing - Type and screen; Standing  Term labor symptoms and general obstetric precautions including but not limited to vaginal bleeding, contractions, leaking of fluid and fetal movement were reviewed in detail with the patient. Please refer to After Visit Summary for other counseling recommendations.   Return in about 1 week (around 03/05/2022) for IN-PERSON, LOB.  Future Appointments  Date Time Provider Department Center  03/05/2022  9:15 AM [facility]  03/05/2022 10:35 AM Crissie Reese, Mary Sella, MD [facility]    Bernerd Limbo, CNM

## 2022-02-27 ENCOUNTER — Telehealth: Payer: Self-pay

## 2022-02-27 ENCOUNTER — Telehealth (HOSPITAL_COMMUNITY): Payer: Self-pay | Admitting: *Deleted

## 2022-02-27 ENCOUNTER — Encounter (HOSPITAL_COMMUNITY): Payer: Self-pay | Admitting: *Deleted

## 2022-02-27 NOTE — Telephone Encounter (Signed)
Called [facility] location for PAP record. ROI was sent on 12/02/21. No answer at medical record dept; call discontinued. VM left for triage RN requesting records.

## 2022-02-27 NOTE — Telephone Encounter (Signed)
Preadmission screen  

## 2022-02-28 ENCOUNTER — Inpatient Hospital Stay (HOSPITAL_COMMUNITY)
Admission: AD | Admit: 2022-02-28 | Discharge: 2022-03-05 | DRG: 787 | Disposition: A | Discharge status: Home / Self Care | Payer: Medicaid Other | Attending: Obstetrics and Gynecology | Admitting: Obstetrics and Gynecology

## 2022-02-28 ENCOUNTER — Other Ambulatory Visit: Payer: Self-pay | Admitting: Advanced Practice Midwife

## 2022-02-28 DIAGNOSIS — Z148 Genetic carrier of other disease: Secondary | ICD-10-CM

## 2022-02-28 DIAGNOSIS — D509 Iron deficiency anemia, unspecified: Secondary | ICD-10-CM | POA: Diagnosis present

## 2022-02-28 DIAGNOSIS — O134 Gestational [pregnancy-induced] hypertension without significant proteinuria, complicating childbirth: Principal | ICD-10-CM | POA: Diagnosis present

## 2022-02-28 DIAGNOSIS — O99019 Anemia complicating pregnancy, unspecified trimester: Secondary | ICD-10-CM

## 2022-02-28 DIAGNOSIS — O9081 Anemia of the puerperium: Secondary | ICD-10-CM | POA: Diagnosis not present

## 2022-02-28 DIAGNOSIS — O8612 Endometritis following delivery: Secondary | ICD-10-CM | POA: Diagnosis not present

## 2022-02-28 DIAGNOSIS — O139 Gestational [pregnancy-induced] hypertension without significant proteinuria, unspecified trimester: Secondary | ICD-10-CM | POA: Diagnosis not present

## 2022-02-28 DIAGNOSIS — O98513 Other viral diseases complicating pregnancy, third trimester: Secondary | ICD-10-CM | POA: Diagnosis present

## 2022-02-28 DIAGNOSIS — F32A Depression, unspecified: Secondary | ICD-10-CM | POA: Diagnosis present

## 2022-02-28 DIAGNOSIS — E876 Hypokalemia: Secondary | ICD-10-CM | POA: Diagnosis present

## 2022-02-28 DIAGNOSIS — N719 Inflammatory disease of uterus, unspecified: Secondary | ICD-10-CM | POA: Diagnosis not present

## 2022-02-28 DIAGNOSIS — U071 COVID-19: Secondary | ICD-10-CM | POA: Diagnosis present

## 2022-02-28 DIAGNOSIS — Z98891 History of uterine scar from previous surgery: Principal | ICD-10-CM

## 2022-02-28 DIAGNOSIS — Z8759 Personal history of other complications of pregnancy, childbirth and the puerperium: Secondary | ICD-10-CM | POA: Diagnosis not present

## 2022-02-28 DIAGNOSIS — Z3403 Encounter for supervision of normal first pregnancy, third trimester: Secondary | ICD-10-CM

## 2022-02-28 DIAGNOSIS — Z3A39 39 weeks gestation of pregnancy: Secondary | ICD-10-CM

## 2022-02-28 DIAGNOSIS — O99284 Endocrine, nutritional and metabolic diseases complicating childbirth: Secondary | ICD-10-CM | POA: Diagnosis present

## 2022-02-28 DIAGNOSIS — F419 Anxiety disorder, unspecified: Secondary | ICD-10-CM | POA: Diagnosis present

## 2022-02-28 DIAGNOSIS — Z3491 Encounter for supervision of normal pregnancy, unspecified, first trimester: Secondary | ICD-10-CM

## 2022-02-28 DIAGNOSIS — D563 Thalassemia minor: Secondary | ICD-10-CM | POA: Diagnosis present

## 2022-02-28 DIAGNOSIS — Z79899 Other long term (current) drug therapy: Secondary | ICD-10-CM

## 2022-02-28 DIAGNOSIS — D62 Acute posthemorrhagic anemia: Secondary | ICD-10-CM | POA: Diagnosis not present

## 2022-03-01 ENCOUNTER — Inpatient Hospital Stay (HOSPITAL_COMMUNITY): Payer: Medicaid Other | Admitting: Anesthesiology

## 2022-03-01 ENCOUNTER — Encounter (HOSPITAL_COMMUNITY)
Admission: AD | Disposition: A | Discharge status: Home / Self Care | Payer: Self-pay | Source: Home / Self Care | Attending: Obstetrics and Gynecology

## 2022-03-01 ENCOUNTER — Encounter (HOSPITAL_COMMUNITY): Payer: Self-pay | Admitting: Obstetrics and Gynecology

## 2022-03-01 ENCOUNTER — Other Ambulatory Visit: Payer: Self-pay

## 2022-03-01 DIAGNOSIS — D563 Thalassemia minor: Secondary | ICD-10-CM | POA: Diagnosis present

## 2022-03-01 DIAGNOSIS — O9081 Anemia of the puerperium: Secondary | ICD-10-CM | POA: Diagnosis not present

## 2022-03-01 DIAGNOSIS — D649 Anemia, unspecified: Secondary | ICD-10-CM | POA: Diagnosis not present

## 2022-03-01 DIAGNOSIS — Z3A39 39 weeks gestation of pregnancy: Secondary | ICD-10-CM | POA: Diagnosis not present

## 2022-03-01 DIAGNOSIS — O9902 Anemia complicating childbirth: Secondary | ICD-10-CM | POA: Diagnosis not present

## 2022-03-01 DIAGNOSIS — O26893 Other specified pregnancy related conditions, third trimester: Secondary | ICD-10-CM | POA: Diagnosis present

## 2022-03-01 DIAGNOSIS — O139 Gestational [pregnancy-induced] hypertension without significant proteinuria, unspecified trimester: Secondary | ICD-10-CM | POA: Diagnosis not present

## 2022-03-01 DIAGNOSIS — E876 Hypokalemia: Secondary | ICD-10-CM | POA: Diagnosis present

## 2022-03-01 DIAGNOSIS — O134 Gestational [pregnancy-induced] hypertension without significant proteinuria, complicating childbirth: Secondary | ICD-10-CM | POA: Diagnosis present

## 2022-03-01 DIAGNOSIS — D62 Acute posthemorrhagic anemia: Secondary | ICD-10-CM | POA: Diagnosis present

## 2022-03-01 DIAGNOSIS — O8612 Endometritis following delivery: Secondary | ICD-10-CM | POA: Diagnosis not present

## 2022-03-01 DIAGNOSIS — Z79899 Other long term (current) drug therapy: Secondary | ICD-10-CM | POA: Diagnosis not present

## 2022-03-01 DIAGNOSIS — Z8759 Personal history of other complications of pregnancy, childbirth and the puerperium: Secondary | ICD-10-CM | POA: Diagnosis not present

## 2022-03-01 DIAGNOSIS — O99284 Endocrine, nutritional and metabolic diseases complicating childbirth: Secondary | ICD-10-CM | POA: Diagnosis present

## 2022-03-01 DIAGNOSIS — Z98891 History of uterine scar from previous surgery: Principal | ICD-10-CM

## 2022-03-01 HISTORY — DX: Acute posthemorrhagic anemia: D62

## 2022-03-01 LAB — PROTEIN / CREATININE RATIO, URINE
Creatinine, Urine: 29.38 mg/dL
Total Protein, Urine: <6 mg/dL

## 2022-03-01 LAB — CBC
HCT: 35.5 % — ABNORMAL LOW (ref 36.0–46.0)
Hemoglobin: 11.7 g/dL — ABNORMAL LOW (ref 12.0–15.0)
MCH: 29.5 pg (ref 26.0–34.0)
MCHC: 33 g/dL (ref 30.0–36.0)
MCV: 89.6 fL (ref 80.0–100.0)
Platelets: 265 10*3/uL (ref 150–400)
RBC: 3.96 MIL/uL (ref 3.87–5.11)
RDW: 13 % (ref 11.5–15.5)
WBC: 9.5 10*3/uL (ref 4.0–10.5)
nRBC: 0 % (ref 0.0–0.2)

## 2022-03-01 LAB — COMPREHENSIVE METABOLIC PANEL
ALT: 9 U/L (ref 0–44)
AST: 16 U/L (ref 15–41)
Albumin: 3.2 g/dL — ABNORMAL LOW (ref 3.5–5.0)
Alkaline Phosphatase: 101 U/L (ref 38–126)
Anion gap: 9 (ref 5–15)
BUN: 5 mg/dL — ABNORMAL LOW (ref 6–20)
CO2: 23 mmol/L (ref 22–32)
Calcium: 9.6 mg/dL (ref 8.9–10.3)
Chloride: 105 mmol/L (ref 98–111)
Creatinine, Ser: 0.56 mg/dL (ref 0.44–1.00)
GFR, Estimated: >60 mL/min (ref >60)
Glucose, Bld: 97 mg/dL (ref 70–99)
Potassium: 3.3 mmol/L — ABNORMAL LOW (ref 3.5–5.1)
Sodium: 137 mmol/L (ref 135–145)
Total Bilirubin: 0.6 mg/dL (ref 0.3–1.2)
Total Protein: 6.8 g/dL (ref 6.5–8.1)

## 2022-03-01 LAB — RPR: RPR Ser Ql: NONREACTIVE

## 2022-03-01 SURGERY — Surgical Case
Anesthesia: Epidural | Site: Abdomen | Wound class: Clean Contaminated

## 2022-03-01 MED ORDER — FAMOTIDINE IN NACL 20-0.9 MG/50ML-% IV SOLN
20.0000 mg | Freq: Once | INTRAVENOUS | Status: AC
  Administered 2022-03-01: 20 mg via INTRAVENOUS

## 2022-03-01 MED ORDER — FENTANYL CITRATE (PF) 100 MCG/2ML IJ SOLN
INTRAMUSCULAR | Status: DC | PRN
Start: 2022-03-01 — End: 2022-03-01
  Administered 2022-03-01: 100 ug via EPIDURAL

## 2022-03-01 MED ORDER — LACTATED RINGERS IV BOLUS
1000.0000 mL | Freq: Once | INTRAVENOUS | Status: AC
  Administered 2022-03-01: 1000 mL via INTRAVENOUS

## 2022-03-01 MED ORDER — EPHEDRINE 5 MG/ML INJ: 10.0000 mg | INTRAVENOUS | Status: DC | PRN

## 2022-03-01 MED ORDER — ACETAMINOPHEN 325 MG PO TABS: 650.0000 mg | ORAL_TABLET | ORAL | Status: DC | PRN

## 2022-03-01 MED ORDER — OXYTOCIN-SODIUM CHLORIDE 30-0.9 UT/500ML-% IV SOLN
1.0000 m[IU]/min | INTRAVENOUS | Status: DC
  Administered 2022-03-01 (×4): 2 m[IU]/min via INTRAVENOUS
  Filled 2022-03-01: qty 500

## 2022-03-01 MED ORDER — LIDOCAINE-EPINEPHRINE (PF) 1.5 %-1:200000 IJ SOLN
INTRAMUSCULAR | Status: DC | PRN
  Administered 2022-03-01: 5 mL via EPIDURAL

## 2022-03-01 MED ORDER — SODIUM CHLORIDE 0.9% FLUSH: 3.0000 mL | Freq: Two times a day (BID) | INTRAVENOUS | Status: DC

## 2022-03-01 MED ORDER — FENTANYL-BUPIVACAINE-NACL 0.5-0.125-0.9 MG/250ML-% EP SOLN
12.0000 mL/h | EPIDURAL | Status: DC | PRN
  Administered 2022-03-01: 12 mL/h via EPIDURAL
  Filled 2022-03-01 (×2): qty 250

## 2022-03-01 MED ORDER — METOCLOPRAMIDE HCL 5 MG/ML IJ SOLN
10.0000 mg | Freq: Once | INTRAMUSCULAR | Status: AC
  Administered 2022-03-01: 10 mg via INTRAVENOUS
  Filled 2022-03-01: qty 2

## 2022-03-01 MED ORDER — OXYTOCIN-SODIUM CHLORIDE 30-0.9 UT/500ML-% IV SOLN
INTRAVENOUS | Status: DC | PRN
  Administered 2022-03-01: 30 [IU] via INTRAVENOUS

## 2022-03-01 MED ORDER — DIPHENHYDRAMINE HCL 25 MG PO CAPS: 25.0000 mg | ORAL_CAPSULE | ORAL | Status: DC | PRN

## 2022-03-01 MED ORDER — KETOROLAC TROMETHAMINE 30 MG/ML IJ SOLN
INTRAMUSCULAR | Status: AC
  Filled 2022-03-01: qty 1

## 2022-03-01 MED ORDER — ONDANSETRON HCL 4 MG/2ML IJ SOLN: 4.0000 mg | Freq: Once | INTRAMUSCULAR | Status: DC | PRN

## 2022-03-01 MED ORDER — LACTATED RINGERS IV SOLN: INTRAVENOUS | Status: DC

## 2022-03-01 MED ORDER — MORPHINE SULFATE (PF) 0.5 MG/ML IJ SOLN
INTRAMUSCULAR | Status: AC
  Filled 2022-03-01: qty 10

## 2022-03-01 MED ORDER — FAMOTIDINE IN NACL 20-0.9 MG/50ML-% IV SOLN
20.0000 mg | Freq: Two times a day (BID) | INTRAVENOUS | Status: DC
  Filled 2022-03-01: qty 50

## 2022-03-01 MED ORDER — SOD CITRATE-CITRIC ACID 500-334 MG/5ML PO SOLN
30.0000 mL | ORAL | Status: AC
  Administered 2022-03-01: 30 mL via ORAL

## 2022-03-01 MED ORDER — FENTANYL CITRATE (PF) 100 MCG/2ML IJ SOLN
INTRAMUSCULAR | Status: DC | PRN
  Administered 2022-03-01: 50 ug via INTRAVENOUS

## 2022-03-01 MED ORDER — SCOPOLAMINE 1 MG/3DAYS TD PT72
MEDICATED_PATCH | TRANSDERMAL | Status: AC
  Filled 2022-03-01: qty 1

## 2022-03-01 MED ORDER — OXYTOCIN-SODIUM CHLORIDE 30-0.9 UT/500ML-% IV SOLN: 2.5000 [IU]/h | INTRAVENOUS | Status: DC

## 2022-03-01 MED ORDER — TRANEXAMIC ACID-NACL 1000-0.7 MG/100ML-% IV SOLN: 1000.0000 mg | INTRAVENOUS | Status: DC

## 2022-03-01 MED ORDER — LIDOCAINE HCL (PF) 1 % IJ SOLN
INTRAMUSCULAR | Status: DC | PRN
  Administered 2022-03-01: 5 mL via EPIDURAL

## 2022-03-01 MED ORDER — PHENYLEPHRINE 80 MCG/ML (10ML) SYRINGE FOR IV PUSH (FOR BLOOD PRESSURE SUPPORT)
80.0000 ug | PREFILLED_SYRINGE | INTRAVENOUS | Status: DC | PRN
  Administered 2022-03-01: 80 ug via INTRAVENOUS

## 2022-03-01 MED ORDER — OXYCODONE-ACETAMINOPHEN 5-325 MG PO TABS: 2.0000 | ORAL_TABLET | ORAL | Status: DC | PRN

## 2022-03-01 MED ORDER — TERBUTALINE SULFATE 1 MG/ML IJ SOLN
0.2500 mg | Freq: Once | INTRAMUSCULAR | Status: DC | PRN
  Filled 2022-03-01: qty 1

## 2022-03-01 MED ORDER — FENTANYL CITRATE (PF) 100 MCG/2ML IJ SOLN
INTRAMUSCULAR | Status: AC
  Filled 2022-03-01: qty 2

## 2022-03-01 MED ORDER — OXYCODONE-ACETAMINOPHEN 5-325 MG PO TABS: 1.0000 | ORAL_TABLET | ORAL | Status: DC | PRN

## 2022-03-01 MED ORDER — PHENYLEPHRINE 80 MCG/ML (10ML) SYRINGE FOR IV PUSH (FOR BLOOD PRESSURE SUPPORT)
80.0000 ug | PREFILLED_SYRINGE | INTRAVENOUS | Status: DC | PRN
Start: 2022-03-01 — End: 2022-03-02
  Filled 2022-03-01: qty 10

## 2022-03-01 MED ORDER — DEXAMETHASONE SODIUM PHOSPHATE 4 MG/ML IJ SOLN
INTRAMUSCULAR | Status: AC
  Filled 2022-03-01: qty 1

## 2022-03-01 MED ORDER — HYDROMORPHONE HCL 1 MG/ML IJ SOLN
INTRAMUSCULAR | Status: AC
  Filled 2022-03-01: qty 0.5

## 2022-03-01 MED ORDER — OXYCODONE HCL 5 MG/5ML PO SOLN: 5.0000 mg | Freq: Once | ORAL | Status: DC | PRN

## 2022-03-01 MED ORDER — SODIUM CHLORIDE 0.9% FLUSH: 3.0000 mL | INTRAVENOUS | Status: DC | PRN

## 2022-03-01 MED ORDER — SODIUM CHLORIDE 0.9 % IV SOLN
500.0000 mg | INTRAVENOUS | Status: AC
  Administered 2022-03-01: 500 mg via INTRAVENOUS

## 2022-03-01 MED ORDER — FENTANYL CITRATE (PF) 100 MCG/2ML IJ SOLN
100.0000 ug | INTRAMUSCULAR | Status: DC | PRN
  Administered 2022-03-01 (×2): 100 ug via INTRAVENOUS
  Filled 2022-03-01 (×2): qty 2

## 2022-03-01 MED ORDER — HYDROMORPHONE HCL 1 MG/ML IJ SOLN
0.2500 mg | INTRAMUSCULAR | Status: DC | PRN
  Administered 2022-03-01: 0.5 mg via INTRAVENOUS

## 2022-03-01 MED ORDER — SODIUM CHLORIDE 0.9 % IV SOLN
INTRAVENOUS | Status: AC
  Filled 2022-03-01: qty 5

## 2022-03-01 MED ORDER — CEFAZOLIN SODIUM-DEXTROSE 2-4 GM/100ML-% IV SOLN
2.0000 g | INTRAVENOUS | Status: AC
  Administered 2022-03-01: 2 g via INTRAVENOUS

## 2022-03-01 MED ORDER — ACETAMINOPHEN 10 MG/ML IV SOLN
INTRAVENOUS | Status: DC | PRN
  Administered 2022-03-01: 1000 mg via INTRAVENOUS

## 2022-03-01 MED ORDER — TRANEXAMIC ACID-NACL 1000-0.7 MG/100ML-% IV SOLN
INTRAVENOUS | Status: AC
  Filled 2022-03-01: qty 100

## 2022-03-01 MED ORDER — MORPHINE SULFATE (PF) 0.5 MG/ML IJ SOLN
INTRAMUSCULAR | Status: DC | PRN
  Administered 2022-03-01: 3 mg via EPIDURAL

## 2022-03-01 MED ORDER — KETOROLAC TROMETHAMINE 30 MG/ML IJ SOLN: 30.0000 mg | Freq: Once | INTRAMUSCULAR | Status: AC | PRN

## 2022-03-01 MED ORDER — DIPHENHYDRAMINE HCL 50 MG/ML IJ SOLN: 12.5000 mg | INTRAMUSCULAR | Status: DC | PRN

## 2022-03-01 MED ORDER — SCOPOLAMINE 1 MG/3DAYS TD PT72
1.0000 | MEDICATED_PATCH | Freq: Once | TRANSDERMAL | Status: AC
  Administered 2022-03-01: 1.5 mg via TRANSDERMAL

## 2022-03-01 MED ORDER — DEXAMETHASONE SODIUM PHOSPHATE 4 MG/ML IJ SOLN
INTRAMUSCULAR | Status: DC | PRN
  Administered 2022-03-01: 4 mg via INTRAVENOUS

## 2022-03-01 MED ORDER — LACTATED RINGERS IV SOLN
500.0000 mL | Freq: Once | INTRAVENOUS | Status: AC
  Administered 2022-03-01: 500 mL via INTRAVENOUS

## 2022-03-01 MED ORDER — ONDANSETRON HCL 4 MG/2ML IJ SOLN
INTRAMUSCULAR | Status: AC
  Filled 2022-03-01: qty 2

## 2022-03-01 MED ORDER — ONDANSETRON HCL 4 MG/2ML IJ SOLN
4.0000 mg | Freq: Four times a day (QID) | INTRAMUSCULAR | Status: DC | PRN
  Administered 2022-03-01 (×2): 4 mg via INTRAVENOUS
  Filled 2022-03-01 (×4): qty 2

## 2022-03-01 MED ORDER — TRANEXAMIC ACID-NACL 1000-0.7 MG/100ML-% IV SOLN
INTRAVENOUS | Status: DC | PRN
  Administered 2022-03-01: 1000 mg via INTRAVENOUS

## 2022-03-01 MED ORDER — LIDOCAINE HCL (PF) 1 % IJ SOLN: 30.0000 mL | INTRAMUSCULAR | Status: DC | PRN

## 2022-03-01 MED ORDER — LACTATED RINGERS AMNIOINFUSION: INTRAVENOUS | Status: DC

## 2022-03-01 MED ORDER — NALOXONE HCL 0.4 MG/ML IJ SOLN: 0.4000 mg | INTRAMUSCULAR | Status: DC | PRN

## 2022-03-01 MED ORDER — SODIUM CHLORIDE 0.9 % IV SOLN: 250.0000 mL | INTRAVENOUS | Status: DC | PRN

## 2022-03-01 MED ORDER — ONDANSETRON HCL 4 MG/2ML IJ SOLN
INTRAMUSCULAR | Status: DC | PRN
  Administered 2022-03-01: 4 mg via INTRAVENOUS

## 2022-03-01 MED ORDER — LACTATED RINGERS IV SOLN
500.0000 mL | INTRAVENOUS | Status: DC | PRN
  Administered 2022-03-01: 200 mL via INTRAVENOUS
  Administered 2022-03-01: 500 mL via INTRAVENOUS
  Administered 2022-03-01: 250 mL via INTRAVENOUS

## 2022-03-01 MED ORDER — STERILE WATER FOR IRRIGATION IR SOLN
Status: DC | PRN
  Administered 2022-03-01: 1

## 2022-03-01 MED ORDER — SODIUM CHLORIDE 0.9 % IV SOLN: INTRAVENOUS | Status: DC | PRN

## 2022-03-01 MED ORDER — MEPERIDINE HCL 25 MG/ML IJ SOLN: 6.2500 mg | INTRAMUSCULAR | Status: DC | PRN

## 2022-03-01 MED ORDER — OXYTOCIN-SODIUM CHLORIDE 30-0.9 UT/500ML-% IV SOLN
INTRAVENOUS | Status: AC
  Filled 2022-03-01: qty 500

## 2022-03-01 MED ORDER — DIPHENHYDRAMINE HCL 50 MG/ML IJ SOLN
12.5000 mg | INTRAMUSCULAR | Status: DC | PRN
  Administered 2022-03-01: 12.5 mg via INTRAVENOUS
  Filled 2022-03-01: qty 1

## 2022-03-01 MED ORDER — DIPHENHYDRAMINE HCL 50 MG/ML IJ SOLN
25.0000 mg | INTRAMUSCULAR | Status: DC | PRN
Start: 2022-03-01 — End: 2022-03-02

## 2022-03-01 MED ORDER — LIDOCAINE-EPINEPHRINE (PF) 2 %-1:200000 IJ SOLN
INTRAMUSCULAR | Status: DC | PRN
  Administered 2022-03-01 (×3): 6 mL via EPIDURAL

## 2022-03-01 MED ORDER — OXYTOCIN BOLUS FROM INFUSION: 333.0000 mL | Freq: Once | INTRAVENOUS | Status: DC

## 2022-03-01 MED ORDER — SODIUM CHLORIDE 0.9 % IV SOLN: 25.0000 mg | INTRAVENOUS | Status: DC | PRN

## 2022-03-01 MED ORDER — OXYCODONE HCL 5 MG PO TABS: 5.0000 mg | ORAL_TABLET | Freq: Once | ORAL | Status: DC | PRN

## 2022-03-01 MED ORDER — SOD CITRATE-CITRIC ACID 500-334 MG/5ML PO SOLN
30.0000 mL | ORAL | Status: DC | PRN
  Filled 2022-03-01: qty 30

## 2022-03-01 MED ORDER — ONDANSETRON HCL 4 MG/2ML IJ SOLN
4.0000 mg | Freq: Three times a day (TID) | INTRAMUSCULAR | Status: DC | PRN
  Administered 2022-03-02 – 2022-03-03 (×2): 4 mg via INTRAVENOUS
  Filled 2022-03-01 (×2): qty 2

## 2022-03-01 MED ORDER — FLEET ENEMA 7-19 GM/118ML RE ENEM: 1.0000 | ENEMA | RECTAL | Status: DC | PRN

## 2022-03-01 MED ORDER — MISOPROSTOL 50MCG HALF TABLET: 50.0000 ug | ORAL_TABLET | ORAL | Status: DC | PRN

## 2022-03-01 MED ORDER — NALOXONE HCL 4 MG/10ML IJ SOLN: 1.0000 ug/kg/h | INTRAVENOUS | Status: DC | PRN

## 2022-03-01 SURGICAL SUPPLY — 42 items
BENZOIN TINCTURE PRP APPL 2/3 (GAUZE/BANDAGES/DRESSINGS) ×2 IMPLANT
CELLS DAT CNTRL 66122 CELL SVR (MISCELLANEOUS) ×1 IMPLANT
CHLORAPREP W/TINT 26ML (MISCELLANEOUS) ×4 IMPLANT
CLAMP CORD UMBIL (MISCELLANEOUS) ×2 IMPLANT
CLOSURE STERI STRIP 1/2 X4 (GAUZE/BANDAGES/DRESSINGS) ×1 IMPLANT
CLOTH BEACON ORANGE TIMEOUT ST (SAFETY) ×2 IMPLANT
DRAPE C SECTION CLR SCREEN (DRAPES) IMPLANT
DRSG OPSITE POSTOP 4X10 (GAUZE/BANDAGES/DRESSINGS) ×2 IMPLANT
ELECT REM PT RETURN 9FT ADLT (ELECTROSURGICAL)
ELECTRODE REM PT RTRN 9FT ADLT (ELECTROSURGICAL) ×1 IMPLANT
EXTRACTOR VACUUM M CUP 4 TUBE (SUCTIONS) IMPLANT
GLOVE BIO SURGEON STRL SZ7.5 (GLOVE) ×2 IMPLANT
GLOVE BIOGEL PI IND STRL 7.0 (GLOVE) ×2 IMPLANT
GLOVE BIOGEL PI INDICATOR 7.0 (GLOVE) ×2
GOWN STRL REUS W/TWL 2XL LVL3 (GOWN DISPOSABLE) ×2 IMPLANT
GOWN STRL REUS W/TWL LRG LVL3 (GOWN DISPOSABLE) ×4 IMPLANT
KIT ABG SYR 3ML LUER SLIP (SYRINGE) IMPLANT
NDL HYPO 25X5/8 SAFETYGLIDE (NEEDLE) IMPLANT
NEEDLE HYPO 22GX1.5 SAFETY (NEEDLE) ×2 IMPLANT
NEEDLE HYPO 25X5/8 SAFETYGLIDE (NEEDLE) IMPLANT
NS IRRIG 1000ML POUR BTL (IV SOLUTION) ×2 IMPLANT
PACK C SECTION WH (CUSTOM PROCEDURE TRAY) ×2 IMPLANT
PAD OB MATERNITY 4.3X12.25 (PERSONAL CARE ITEMS) ×2 IMPLANT
RETRACTOR WND ALEXIS 18 MED (MISCELLANEOUS) IMPLANT
RTRCTR C-SECT PINK 25CM LRG (MISCELLANEOUS) ×2 IMPLANT
RTRCTR WOUND ALEXIS 18CM MED (MISCELLANEOUS) ×2
STRIP CLOSURE SKIN 1/2X4 (GAUZE/BANDAGES/DRESSINGS) ×2 IMPLANT
SUT CHROMIC 1 CTX 36 (SUTURE) ×4 IMPLANT
SUT PLAIN 0 NONE (SUTURE) IMPLANT
SUT VIC AB 1 CT1 36 (SUTURE) ×2 IMPLANT
SUT VIC AB 2-0 CT1 (SUTURE) ×2 IMPLANT
SUT VIC AB 2-0 CT1 27 (SUTURE) ×2
SUT VIC AB 2-0 CT1 TAPERPNT 27 (SUTURE) ×1 IMPLANT
SUT VIC AB 3-0 CT1 27 (SUTURE) ×2
SUT VIC AB 3-0 CT1 TAPERPNT 27 (SUTURE) ×1 IMPLANT
SUT VIC AB 3-0 SH 27 (SUTURE)
SUT VIC AB 3-0 SH 27X BRD (SUTURE) IMPLANT
SUT VIC AB 4-0 KS 27 (SUTURE) ×2 IMPLANT
SYR BULB IRRIGATION 50ML (SYRINGE) IMPLANT
TOWEL OR 17X24 6PK STRL BLUE (TOWEL DISPOSABLE) ×2 IMPLANT
TRAY FOLEY W/BAG SLVR 14FR LF (SET/KITS/TRAYS/PACK) ×2 IMPLANT
WATER STERILE IRR 1000ML POUR (IV SOLUTION) ×2 IMPLANT

## 2022-03-01 NOTE — Progress Notes (Signed)
Patient ID: Melissa Guzman, female   DOB: 28-Jan-1996, 26 y.o.   MRN: [id number]  Strip review:   135 bpm, mod var, present acel, no decelerations since pitocin was d/c at 1528 and then restarted at 1633. Dr. Damita Dunnings aware of patient's tracing and fetal and maternal status; will continue to monitor closely.   Patient Vitals for the past 24 hrs:  BP Temp Temp src Pulse Resp SpO2 Height Weight  03/01/22 1601 128/76 -- -- (!) 103 -- -- -- --  03/01/22 1501 (!) 142/84 -- -- 76 -- -- -- --  03/01/22 1434 126/81 -- -- 86 -- -- -- --  03/01/22 1306 125/72 98.6 F (37 C) Oral 92 -- -- -- --  03/01/22 1256 -- 98.6 F (37 C) Oral -- -- -- -- --  03/01/22 1236 (!) 140/99 -- -- (!) 117 -- -- -- --  03/01/22 1201 127/83 -- -- 99 -- -- -- --  03/01/22 1101 125/72 -- -- 88 -- -- -- --  03/01/22 1031 120/75 -- -- 97 -- -- -- --  03/01/22 1001 127/78 -- -- 92 -- -- -- --  03/01/22 0931 115/65 -- -- 95 -- -- -- --  03/01/22 0902 (!) 126/98 -- -- 92 -- -- -- --  03/01/22 0900 124/82 -- -- 78 -- -- -- --  03/01/22 0800 125/61 -- -- 100 -- -- -- --  03/01/22 0750 -- 98.6 F (37 C) Oral -- -- -- -- --  03/01/22 0730 109/61 -- -- (!) 101 -- 96 % -- --  03/01/22 0701 107/62 -- -- (!) 101 -- 96 % -- --  03/01/22 0630 (!) 104/58 -- -- 88 -- 99 % -- --  03/01/22 0601 (!) 108/54 -- -- 76 -- -- -- --  03/01/22 0535 -- -- -- -- -- 93 % -- --  03/01/22 0531 103/60 -- -- 81 -- -- -- --  03/01/22 0530 -- -- -- -- -- 94 % -- --  03/01/22 0501 107/65 -- -- 80 -- -- -- --  03/01/22 0450 -- 98.1 F (36.7 C) Axillary -- -- -- -- --  03/01/22 0431 117/72 -- -- 79 -- -- -- --  03/01/22 0410 106/67 -- -- 83 -- 97 % -- --  03/01/22 0331 123/72 98.2 F (36.8 C) Oral (!) 102 -- 97 % -- --  03/01/22 0325 125/76 -- -- (!) 104 16 97 % -- --  03/01/22 0321 130/76 -- -- 84 -- -- -- --  03/01/22 0316 140/85 -- -- (!) 101 -- -- -- --  03/01/22 0311 139/85 -- -- 93 -- -- -- --  03/01/22 0306 128/83 -- -- (!) 143 -- -- -- --   03/01/22 0301 129/77 -- -- 77 -- -- -- --  03/01/22 0212 134/85 -- -- 84 -- -- -- --  03/01/22 0113 128/87 98.1 F (36.7 C) Oral 94 18 -- -- --  03/01/22 0100 131/76 -- -- (!) 103 -- 97 % -- --  03/01/22 0045 (!) 138/98 -- -- (!) 101 -- -- -- --  03/01/22 0031 (!) 137/92 -- -- 94 -- -- -- --  03/01/22 0018 140/87 -- -- 94 -- 100 % -- --  03/01/22 0006 123/78 98.1 F (36.7 C) -- -- -- -- -- --  03/01/22 0004 -- -- Oral (!) 107 18 99 % 5\' 2"  (1.575 m) 73.8 kg    Melissa Guzman

## 2022-03-01 NOTE — Progress Notes (Signed)
Patient ID: Melissa Guzman, female   DOB: [birth year], 26 y.o.   MRN: [id number]   Melissa Guzman is a 26 y.o. G1P0000 at [redacted]w[redacted]d  admitted for SOL  Subjective: Patient has had her epidural re-dosed; she now reports resolution of her pain.   Objective: Vitals:   03/01/22 1725 03/01/22 1731 03/01/22 1736 03/01/22 1742  BP: 114/64 (!) 107/59 (!) 112/59 119/68  Pulse: 72 73 69 80  Resp:      Temp:      TempSrc:      SpO2:      Weight:      Height:       No intake/output data recorded.  FHT:  FHR: 120 bpm, variability: moderate,  accelerations:  Present,  decelerations:  Present   UC:   irregular, every 2-4 minutes SVE:   Dilation: 4.5 Effacement (%): 90 Station: -1 Exam by:: Melissa Guzman. CNM Pitocin @ 2 mu/min  Labs: Lab Results  Component Value Date   WBC 9.5 03/01/2022   HGB 11.7 (L) 03/01/2022   HCT 35.5 (L) 03/01/2022   MCV 89.6 03/01/2022   PLT 265 03/01/2022    Assessment / Plan: Called to room by RN, pitocin turned off as attending in the OR; deceleration resolved with positioning; amnioinfusion has been turned off  Patient has been 4.5 cm all afternoon; will consult with Dr. Damita Dunnings once she is out of the OR  Labor:  protracted latent phase Fetal Wellbeing:   Cat 2 due to decelerations but has reassuring acels and mod variabiliyt; strip was revied with Dr. Damita Dunnings at 1810 Pain Control:  Epidural Anticipated MOD:  NSVD  Melissa Guzman 03/01/2022, 6:15 PM

## 2022-03-01 NOTE — Anesthesia Procedure Notes (Signed)
Epidural Patient location during procedure: OB Start time: 03/01/2022 2:50 AM End time: 03/01/2022 3:00 AM  Staffing Anesthesiologist: Murvin Natal, MD Performed: anesthesiologist   Preanesthetic Checklist Completed: patient identified, IV checked, site marked, risks and benefits discussed, monitors and equipment checked, pre-op evaluation and timeout performed  Epidural Patient position: sitting Prep: DuraPrep Patient monitoring: heart rate, cardiac monitor, continuous pulse ox and blood pressure Approach: midline Location: L4-L5 Injection technique: LOR air  Needle:  Needle type: Tuohy  Needle gauge: 17 G Needle length: 9 cm Needle insertion depth: 5 cm Catheter type: closed end flexible Catheter size: 19 Gauge Catheter at skin depth: 10 cm Test dose: negative and 1.5% lidocaine with Epi 1:200 K  Assessment Events: blood not aspirated, injection not painful, no injection resistance and negative IV test  Additional Notes Informed consent obtained prior to proceeding including risk of failure, 1% risk of PDPH, risk of minor discomfort and bruising. Discussed alternatives to epidural analgesia and patient desires to proceed.  Timeout performed pre-procedure verifying patient name, procedure, and platelet count.  Patient tolerated procedure well. Reason for block:procedure for pain

## 2022-03-01 NOTE — Progress Notes (Signed)
Labor Progress Note Melissa Guzman is a 26 y.o. G1P0000 at [redacted]w[redacted]d who presented for SOL.   S: Doing well. No concerns. Friend at bedside.   O:  BP 140/78   Pulse 80   Temp 99.1 F (37.3 C)   Resp 18   Ht 5\' 2"  (1.575 m)   Wt 73.8 kg   LMP 05/27/2021   SpO2 96%   BMI 29.78 kg/m   EFM: Baseline 135 bpm, moderate variability, + accels, late decels   CVE: Dilation: 5 Effacement (%): 90 Station: -1 Presentation: Vertex Exam by:: [email or web address] CNM  A&P: 26 y.o. G1P0000 [redacted]w[redacted]d   #Labor: Minimal cervical change over the course of the day today. Attempted to start Pitocin today; however, unable to get above Pitocin of 2 milli-Units/min before discontinuation due to decelerations. At this time, recommended proceeding with cesarean section for fetal intolerance of labor.   The risks of surgery were discussed with the patient including but were not limited to: bleeding which may require transfusion or reoperation; infection which may require antibiotics; injury to bowel, bladder, ureters or other surrounding organs; injury to the fetus; need for additional procedures including hysterectomy in the event of a life-threatening hemorrhage; formation of adhesions; placental abnormalities with subsequent pregnancies; incisional problems; thromboembolic phenomenon and other postoperative/anesthesia complications.  The patient concurred with the proposed plan, giving informed written consent for the procedure.   Anesthesia and OR aware. Preoperative prophylactic antibiotics and SCDs ordered on call to the OR.  To OR when ready.  #Pain: Epidural  #FWB: Cat 2 due to recent late deceleration. Recovered well. Reassuring variability at this time.  #GBS negative, Ancef and Azithro for surgical prophylaxis. Plan for TXA at delivery.   [redacted]w[redacted]d, MD 8:30 PM

## 2022-03-01 NOTE — Progress Notes (Signed)
Melissa Guzman is a 26 y.o. G1P0000 at [redacted]w[redacted]d by LMP admitted for spontaneous onset of labor.   Subjective: She is starting to get comfortable with epidural.   Objective: BP 140/85   Pulse (!) 101   Temp 98.1 F (36.7 C) (Oral)   Resp 18   Ht 5\' 2"  (1.575 m)   Wt 73.8 kg   LMP 05/27/2021   SpO2 97%   BMI 29.78 kg/m  No intake/output data recorded. No intake/output data recorded.  FHT:  FHR: 115 bpm, variability: moderate,  accelerations:  Abscent,  decelerations:  Absent UC:   regular, every 2 minutes Concern for fetal arrhythmia; Audible at bedside.  Abdomen soft between contractions. Contractions moderate to palpation.   SVE:   Dilation: 1.5 Effacement (%): 80 Station: -1, 0 Exam by:: Cailin [email or web address], RN  Labs: Lab Results  Component Value Date   WBC 9.5 03/01/2022   HGB 11.7 (L) 03/01/2022   HCT 35.5 (L) 03/01/2022   MCV 89.6 03/01/2022   PLT 265 03/01/2022  Patient Vitals for the past 24 hrs:  BP Temp Temp src Pulse Resp SpO2 Height Weight  03/01/22 0331 123/72 98.2 F (36.8 C) Oral (!) 102 -- 97 % -- --  03/01/22 0325 125/76 -- -- (!) 104 16 97 % -- --  03/01/22 0321 130/76 -- -- 84 -- -- -- --  03/01/22 0316 140/85 -- -- (!) 101 -- -- -- --  03/01/22 0311 139/85 -- -- 93 -- -- -- --  03/01/22 0306 128/83 -- -- (!) 143 -- -- -- --  03/01/22 0301 129/77 -- -- 77 -- -- -- --  03/01/22 0212 134/85 -- -- 84 -- -- -- --  03/01/22 0113 128/87 98.1 F (36.7 C) Oral 94 18 -- -- --  03/01/22 0100 131/76 -- -- (!) 103 -- 97 % -- --  03/01/22 0045 (!) 138/98 -- -- (!) 101 -- -- -- --  03/01/22 0031 (!) 137/92 -- -- 94 -- -- -- --  03/01/22 0018 140/87 -- -- 94 -- 100 % -- --  03/01/22 0006 123/78 98.1 F (36.7 C) -- -- -- -- -- --  03/01/22 0004 -- -- Oral (!) 107 18 99 % 5\' 2"  (1.575 m) 73.8 kg     Assessment / Plan: Spontaneous labor, s/p epidural.   Labor:  Plan to reassess for progressing labor when patient gets comfortable with epidural.  - CNM called MD to  update patient status and Consult for concern of fetal heart tracing post epidural. MD reviewed strip and discussed options for AROM and FSE if no improvement. Will continue to monitor.  Preeclampsia: Elevated BPs intrapartum  PreE labs normal.  Continue to monitor for worsening signs/symptoms.  Fetal Wellbeing:  Category II Pain Control:  Epidural I/D:   GBS Neg MOD: NSVD  05/01/22, CNM 03/01/2022, 3:24 AM

## 2022-03-01 NOTE — MAU Note (Signed)
.  Melissa Guzman is a 26 y.o. at [redacted]w[redacted]d here in MAU reporting: CTX since 2200 yesterday every 2 mins. Pt DFM, VB, LOF, abnormal discharge, PIH s/s, and complication in the pregnancy.  Last intercourse two days ago SVE 0/closed  5/26 GBS neg Onset of complaint: 2200 Pain score: 10/10 Vitals:   03/01/22 0004  Pulse: (!) 107  Resp: 18  SpO2: 99%     FHT:190 Lab orders placed from triage:

## 2022-03-01 NOTE — Anesthesia Preprocedure Evaluation (Signed)
Anesthesia Evaluation  Patient identified by MRN, date of birth, ID band Patient awake    Reviewed: Allergy & Precautions, H&P , NPO status , Patient's Chart, lab work & pertinent test results  History of Anesthesia Complications Negative for: history of anesthetic complications  Airway Mallampati: II  TM Distance: >3 FB Neck ROM: full    Dental no notable dental hx. (+) Teeth Intact   Pulmonary neg pulmonary ROS,    Pulmonary exam normal breath sounds clear to auscultation       Cardiovascular negative cardio ROS Normal cardiovascular exam Rhythm:regular Rate:Normal     Neuro/Psych PSYCHIATRIC DISORDERS Anxiety Depression negative neurological ROS     GI/Hepatic negative GI ROS, Neg liver ROS,   Endo/Other  negative endocrine ROS  Renal/GU negative Renal ROS  negative genitourinary   Musculoskeletal   Abdominal   Peds  Hematology  (+) Blood dyscrasia, anemia ,   Anesthesia Other Findings   Reproductive/Obstetrics (+) Pregnancy                             Anesthesia Physical Anesthesia Plan  ASA: 2  Anesthesia Plan: Epidural   Post-op Pain Management:    Induction:   PONV Risk Score and Plan:   Airway Management Planned:   Additional Equipment:   Intra-op Plan:   Post-operative Plan:   Informed Consent: I have reviewed the patients History and Physical, chart, labs and discussed the procedure including the risks, benefits and alternatives for the proposed anesthesia with the patient or authorized representative who has indicated his/her understanding and acceptance.       Plan Discussed with:   Anesthesia Plan Comments:         Anesthesia Quick Evaluation

## 2022-03-01 NOTE — Progress Notes (Signed)
   Melissa Guzman is a 26 y.o. G1P0000 at [redacted]w[redacted]d  admitted for SOL with leaking of greenish colored fluid all night.   Subjective: Doing well, comfortable with epidural  Objective: Vitals:   03/01/22 0900 03/01/22 0902 03/01/22 0931 03/01/22 1001  BP: 124/82 (!) 126/98 115/65 127/78  Pulse: 78 92 95 92  Resp:      Temp:      TempSrc:      SpO2:      Weight:      Height:       No intake/output data recorded.  FHT:  FHR: 140 bpm, variability: moderate,  accelerations:  Present,  decelerations:  Present variables after AROM UC:   irregular, every 2 minutes SVE:   Dilation: 3 Effacement (%): 90 Station: -1 Exam by:: Dorathy Daft, CNM Pitocin @ 0 mu/min  Labs: Lab Results  Component Value Date   WBC 9.5 03/01/2022   HGB 11.7 (L) 03/01/2022   HCT 35.5 (L) 03/01/2022   MCV 89.6 03/01/2022   PLT 265 03/01/2022    Assessment / Plan: Now with aROM, IUPC, FSE and epidural. AROM with thick mec; fetal with repetitive decelerations after contractions which resolved by positioning and IV fluid bolus. Will hold pitocin and see how labor progresses   Labor:  after AROM, cervix feels like 4 cm and 80% Fetal Wellbeing:  Category II Pain Control:  Epidural Anticipated MOD:  NSVD  Charlesetta Garibaldi De Libman 03/01/2022, 10:11 AM

## 2022-03-01 NOTE — Transfer of Care (Signed)
Immediate Anesthesia Transfer of Care Note  Patient: Melissa Guzman  Procedure(s) Performed: CESAREAN SECTION (Abdomen)  Patient Location: PACU  Anesthesia Type:Epidural  Level of Consciousness: awake, alert  and oriented  Airway & Oxygen Therapy: Patient Spontanous Breathing  Post-op Assessment: Report given to RN and Post -op Vital signs reviewed and stable  Post vital signs: Reviewed and stable  Last Vitals:  Vitals Value Taken Time  BP 110/67 03/01/22 2230  Temp 37.5 C 03/01/22 2225  Pulse 91 03/01/22 2240  Resp 22 03/01/22 2240  SpO2 97 % 03/01/22 2240  Vitals shown include unvalidated device data.  Last Pain:  Vitals:   03/01/22 2002  TempSrc:   PainSc: 9       Patients Stated Pain Goal: 0 (03/01/22 1631)  Complications: No notable events documented.

## 2022-03-01 NOTE — Progress Notes (Signed)
Patient ID: Melissa Guzman, female   DOB: 1995-10-13, 26 y.o.   MRN: [id number]   Melissa Guzman is a 26 y.o. G1P0000 at [redacted]w[redacted]d  admitted for SOL  Subjective: Feeling lots of back pressure  Objective: Vitals:   03/01/22 1742 03/01/22 1831 03/01/22 1901 03/01/22 1934  BP: 119/68 (!) 109/59 116/61 135/81  Pulse: 80 82 80 85  Resp:    18  Temp:    99.1 F (37.3 C)  TempSrc:      SpO2:      Weight:      Height:       No intake/output data recorded.  FHT:  140 bpm, mod var, present acel, deceleration at 1928 UC:   irregular, every 1-2 minutes SVE:   Dilation: 5 Effacement (%): 90 Station: -1 Exam by:: Melissa Guzman. CNM Pitocin @ 0 mu/min  Labs: Lab Results  Component Value Date   WBC 9.5 03/01/2022   HGB 11.7 (L) 03/01/2022   HCT 35.5 (L) 03/01/2022   MCV 89.6 03/01/2022   PLT 265 03/01/2022    Assessment / Plan:   Labor:  very slow progress, now 5 cm, swollen cervix has been resolved and head feels lower but still with occasional decelerations that are non-repetitive; continuing to leak brown fluid.  Reassuring that there is moderate variability. Per. Dr. Damita Dunnings, will hold pit and discuss with night team Fetal Wellbeing:  Category II Pain Control:  Epidural Anticipated MOD:   patient is aware that there may be a need for a c/section if we cannot restart pitocin  Melissa Guzman 03/01/2022, 7:38 PM

## 2022-03-01 NOTE — MAU Provider Note (Signed)
S: Ms. Annete Ayuso is a 27 y.o. G1P0000 at [redacted]w[redacted]d who presents to MAU today complaining of contractions q 2 minutes since 2200. She denies vaginal bleeding. She denies LOF. She reports normal fetal movement.    O: BP 140/87   Pulse 94   Temp 98.1 F (36.7 C)   Resp 18   Ht 5\' 2"  (1.575 m)   Wt 73.8 kg   LMP 05/27/2021   SpO2 100%   BMI 29.78 kg/m   GENERAL: Well-developed, well-nourished female in no acute distress.  HEAD: Normocephalic, atraumatic.  CHEST: Normal effort of breathing, normal heart rate, no LE edema.  ABDOMEN: Soft, nontender, gravid.  Cervical exam:  Dilation: 1 Effacement (%): 90 Station: -2 Presentation: Vertex Exam by:: [email or web address], RN  Fetal Monitoring: Baseline: 180 bpm Variability: Moderate variability  Accelerations: Present  Decelerations: Intermittent variable decelerations (mild) Contractions: Every 2 minutes   A: SIUP at [redacted]w[redacted]d  Elevated BP at term Early labor  Cat 2 FHT  P: Admit to L&D IVF bolus ordered CBC, CMP, and urine P:C collected and pending Further management per L&D team   [redacted]w[redacted]d, MD 03/01/2022 12:31 AM

## 2022-03-01 NOTE — Progress Notes (Signed)
Patient ID: Melissa Guzman, female   DOB: Dec 30, 1995, 26 y.o.   MRN: [id number]   Melissa Guzman is a 26 y.o. G1P0000 at [redacted]w[redacted]d  admitted for SOL  Subjective: Doing well with epidural, she does not feel her contractions. Had an episode of vomiting  Objective: Vitals:   03/01/22 0931 03/01/22 1001 03/01/22 1031 03/01/22 1101  BP: 115/65 127/78 120/75 125/72  Pulse: 95 92 97 88  Resp:      Temp:      TempSrc:      SpO2:      Weight:      Height:       No intake/output data recorded.  FHT:  FHR: 140 bpm, variability: moderate,  accelerations:  Present,  decelerations:  Present 2 min deceleration after episode of vomiting; resolved with position changes. Pit was at 2 mu/min.  UC:   irregular, every 2-3 minutes SVE:   4.5/90/-1 Pitocin @ 2 mu/min  Labs: Lab Results  Component Value Date   WBC 9.5 03/01/2022   HGB 11.7 (L) 03/01/2022   HCT 35.5 (L) 03/01/2022   MCV 89.6 03/01/2022   PLT 265 03/01/2022    Assessment / Plan: Making slow change, was on 2 m/u of pitocin for approximately 30 minutes and then had prolonged decelerations to 60-70s; this resolved with position changes. Cervix was rechecked and now 4-5; she was given amnioinfusion and bolus and pitocin with turned off. Will continue with amnioinfusion and give terbutalin if deceleration happens again; at the time of deceleration contractions were every 1-2 minutes and could be reason for NRFT.   Labor:  slow progress Fetal Wellbeing:  Category II Pain Control:  Epidural Anticipated MOD:  NSVD  Marylene Land 03/01/2022, 12:47 PM

## 2022-03-01 NOTE — Discharge Summary (Signed)
Postpartum Discharge Summary     Patient Name: Melissa Guzman DOB: [birth year] MRN: [id number]  Date of admission: 02/28/2022 Delivery date:03/01/2022  Delivering provider: Chancy Milroy  Date of discharge: 03/05/2022  Admitting diagnosis: Indication for care in labor or delivery [O75.9] Intrauterine pregnancy: [redacted]w[redacted]d    Secondary diagnosis:  Principal Problem:   S/P cesarean section Active Problems:   Supervision of low-risk pregnancy, first trimester   Iron deficiency anemia   Alpha thalassemia silent carrier   Carrier of spinal muscular atrophy   Anxiety and depression   COVID-19 affecting pregnancy in third trimester   Acute blood loss anemia   Gestational hypertension   Endometritis  Additional problems: None   Discharge diagnosis: Term Pregnancy Delivered                                              Post partum procedures:  None Augmentation: AROM and Pitocin Complications: Postpartum endometritis, acute blood loss anemia   Hospital course: Onset of Labor With Unplanned C/S   26y.o. yo G1P1001 at [id number] admitted in Latent Labor on 02/28/2022. Patient progressed to 4-5 cm after AROM.  Patient was started on Pitocin for augmentation, but this had to be discontinued multiple times due to late/prolonged decelerations. The patient went for cesarean section due to fetal intolerance of labor. Delivery details as follows: Membrane Rupture Time/Date: 9:35 AM ,03/01/2022   Delivery Method:C-Section, Low Transverse  Details of operation can be found in separate operative note. Her hemoglobin on POD#1 was 7.8, for which she received IV Venofer.  On POD#2, patient reported feeling unwell and had an elevated temperature of 102.2.  She also had uterine tenderness and an elevated WBC, so she was started on IV Zosyn for 24 hours for endometritis.  She was also found to have a Hgb of 4.4, for which she received 4 units of pRBCs.  Her post-transfusion Hgb was 9.0.  She was remained afebrile after  treatment with Zosyn and asymptomatic from her anemia after transfusion.  Her BP was mildly elevated on POD#3, for which she was started on Procardia 30 mg.  She was started on Lasix postpartum, which she will continued upon discharge to complete a 5 day course.  She is ambulating,tolerating a regular diet, passing flatus, and urinating well.  Her pain and bleeding are controlled.  She is breastfeeding well.  Patient is discharged home in stable condition 03/05/22.  Newborn Data: Birth date:03/01/2022  Birth time:9:37 PM  Gender:Female  Living status:Living  Apgars:8 ,8  Weight:2800 g   Magnesium Sulfate received: No BMZ received: No Rhophylac: N/A MMR: N/A T-DaP: Given prenatally Flu: Given prenatally  Transfusion: Yes  Physical exam  Vitals:   03/04/22 2105 03/04/22 2210 03/05/22 0514 03/05/22 1128  BP: 129/82 130/85 134/80 131/87  Pulse: 89 86 97 (!) 112  Resp: _0 Temp: 98.3 F (36.8 C)  99.9 F (37.7 C) 98.2 F (36.8 C)  TempSrc: Oral     SpO2: 100%  98%   Weight:      Height:       General: alert, cooperative, and no distress Lochia: appropriate Uterine Fundus: firm and below umbilicus  Incision: healing well with no significant drainage, no significant erythema, dressing is clean, dry, and intact DVT Evaluation: no LE edema or calf tenderness to palpation  Labs: Lab Results  Component Value Date   WBC 18.6 (H) 03/05/2022   HGB 8.5 (L) 03/05/2022   HCT 25.2 (L) 03/05/2022   MCV 88.4 03/05/2022   PLT 251 03/05/2022      Latest Ref Rng & Units 03/05/2022    5:11 AM  CMP  Glucose 70 - 99 mg/dL 83    BUN 6 - 20 mg/dL 5    Creatinine 0.44 - 1.00 mg/dL 0.68    Sodium 135 - 145 mmol/L 137    Potassium 3.5 - 5.1 mmol/L 3.2    Chloride 98 - 111 mmol/L 106    CO2 22 - 32 mmol/L 22    Calcium 8.9 - 10.3 mg/dL 7.9    Total Protein 6.5 - 8.1 g/dL 4.9    Total Bilirubin 0.3 - 1.2 mg/dL 0.6    Alkaline Phos 38 - 126 U/L 84    AST 15 - 41 U/L 18    ALT 0 - 44  U/L 12     Edinburgh Score:    03/02/2022    5:45 PM  Edinburgh Postnatal Depression Scale Screening Tool  I have been able to laugh and see the funny side of things. 0  I have looked forward with enjoyment to things. 0  I have blamed myself unnecessarily when things went wrong. 2  I have been anxious or worried for no good reason. 3  I have felt scared or panicky for no good reason. 2  Things have been getting on top of me. 2  I have been so unhappy that I have had difficulty sleeping. 1  I have felt sad or miserable. 1  I have been so unhappy that I have been crying. 1  The thought of harming myself has occurred to me. 0  Edinburgh Postnatal Depression Scale Total 12   After visit meds:  Allergies as of 03/05/2022   No Known Allergies      Medication List     STOP taking these medications    busPIRone 10 MG tablet Commonly known as: BUSPAR   ondansetron 4 MG disintegrating tablet Commonly known as: ZOFRAN-ODT       TAKE these medications    acetaminophen 500 MG tablet Commonly known as: TYLENOL Take 2 tablets (1,000 mg total) by mouth every 8 (eight) hours as needed (pain).   Comfort Fit Maternity Supp Med Misc 1 Units by Does not apply route daily.   ferrous sulfate 325 (65 FE) MG tablet Commonly known as: FerrouSul Take 1 tablet (325 mg total) by mouth every other day.   furosemide 20 MG tablet Commonly known as: LASIX Take 1 tablet (20 mg total) by mouth daily for 1 day.   ibuprofen 600 MG tablet Commonly known as: ADVIL Take 1 tablet (600 mg total) by mouth every 6 (six) hours as needed (pain).   NIFEdipine 30 MG 24 hr tablet Commonly known as: ADALAT CC Take 1 tablet (30 mg total) by mouth daily.   oxyCODONE 5 MG immediate release tablet Commonly known as: Oxy IR/ROXICODONE Take 1 tablet (5 mg total) by mouth every 6 (six) hours as needed for severe pain or breakthrough pain.   PrePLUS 27-1 MG Tabs Take 1 tablet by mouth daily.                Discharge Care Instructions  (From admission, onward)           Start     Ordered   03/05/22 0000  Discharge  wound care:       Comments: Remove dressing tomorrow 6/8. You can then wash the area gently with soap and water in the shower and pat dry. You will have an incision check in about 1 week.   03/05/22 1210            Discharge home in stable condition Infant Feeding: Bottle and Breast Infant Disposition: home with mother Discharge instruction: per After Visit Summary and Postpartum booklet. Activity: Advance as tolerated. Pelvic rest for 6 weeks.  Diet: routine diet Future Appointments: Future Appointments  Date Time Provider [place]  03/10/2022 10:00 AM [facility] NURSE [facility]   Follow up Visit: Message sent to [facility] by Dr. Gwenlyn Perking on 03/01/22.   Please schedule this patient for a In person postpartum visit in 6 weeks with the following provider: Any provider. Additional Postpartum F/U: Postpartum Depression checkup, Incision check 1 week, and BP check 1 week  Low risk pregnancy complicated by: gHTN intrapartum  Delivery mode:  C-Section, Low Transverse  Anticipated Birth Control:  Unsure  03/05/2022 Genia Del, MD

## 2022-03-01 NOTE — Progress Notes (Addendum)
Patient ID: Melissa Guzman, female   DOB: 1996/03/03, 26 y.o.   MRN: [id number]   Melissa Guzman is a 26 y.o. G1P0000 at [redacted]w[redacted]d  admitted for SOL  Subjective: Patient having occasional side pains and has been repositioned multiple times for comfort. She also reports nausea and vomiting from the food smells in the room.   Objective: Vitals:   03/01/22 1306 03/01/22 1434 03/01/22 1501 03/01/22 1601  BP: 125/72 126/81 (!) 142/84 128/76  Pulse: 92 86 76 (!) 103  Resp:      Temp: 98.6 F (37 C)     TempSrc: Oral     SpO2:      Weight:      Height:       No intake/output data recorded.  FHT:  FHR: 120 bpm, variability: moderate,  accelerations:  Present,  decelerations:  Present prolonged deceleration after vomiting UC:   regular, every 1-2 minutes SVE:   Dilation: 4.5 Effacement (%): 90 Station: -1 Exam by:: Keane Scrape. CNM Pitocin @ 4 mu/min MVU are  Labs: Lab Results  Component Value Date   WBC 9.5 03/01/2022   HGB 11.7 (L) 03/01/2022   HCT 35.5 (L) 03/01/2022   MCV 89.6 03/01/2022   PLT 265 03/01/2022    Assessment / Plan: Protracted latent phase -amnioinfusion still in place at 150 ml/hour with good return -VSS At 1528 patient began vomiting and FHR dropped to 60s with slow recovery; deceleration last 5 minutes. Pitocin was discontinued, patient was repositioned and examined, still 4 cm now with some cervical swelling and still with dark fluid stained meconium. Terb was available if necessary. -will reposition patient, give Benadryl for swollen cervix, Reglan and pepcid for nausea,  -patient and mother given update on the patient's status, Dr. Para March will be updated on patient's status as well.  -restart pitocin at 2 m/u after 20 min of reassuring tracing Labor:  slow progress Fetal Wellbeing:  Category II Pain Control:  Epidural Anticipated MOD:  NSVD  Melissa Guzman 03/01/2022, 4:34 PM

## 2022-03-01 NOTE — Op Note (Signed)
Melissa Guzman  PROCEDURE DATE: 03/01/2022  PREOPERATIVE DIAGNOSES: Intrauterine pregnancy at [redacted]w[redacted]d weeks gestation; fetal intolerance of labor   POSTOPERATIVE DIAGNOSES: The same  PROCEDURE: Primary Low Transverse Cesarean Section  SURGEON:  Dr. Nettie Elm  ASSISTANT:  Dr. Evalina Field  ANESTHESIOLOGY TEAM: Anesthesiologist: Leonides Grills, MD; Lannie Fields, DO; Richardson Landry Tera Mater, MD CRNA: Armanda Heritage, CRNA  INDICATIONS: Melissa Guzman is a 26 y.o. G1P1001 at [redacted]w[redacted]d here for cesarean section secondary to the indications listed under preoperative diagnoses; please see preoperative note for further details.  The risks of cesarean section were discussed with the patient including but were not limited to: bleeding which may require transfusion or reoperation; infection which may require antibiotics; injury to bowel, bladder, ureters or other surrounding organs; injury to the fetus; need for additional procedures including hysterectomy in the event of a life-threatening hemorrhage; placental abnormalities wth subsequent pregnancies, incisional problems, thromboembolic phenomenon and other postoperative/anesthesia complications.   The patient concurred with the proposed plan, giving informed written consent for the procedure.    FINDINGS:  Viable female infant in cephalic presentation.  Apgars 8 and 9.  Thick meconium stained fluid.  Intact placenta, three vessel cord.  Normal uterus.  ANESTHESIA: Epidural  INTRAVENOUS FLUIDS: 1700 ml   ESTIMATED BLOOD LOSS: 648 ml URINE OUTPUT:  100 ml SPECIMENS: Placenta sent to L&D  COMPLICATIONS: None immediate  PROCEDURE IN DETAIL:   The patient preoperatively received intravenous antibiotics and had sequential compression devices applied to her lower extremities.  She was then taken to the operating room where the epidural anesthesia was dosed up to surgical level and was found to be adequate. She was then placed in a dorsal supine  position with a leftward tilt, and prepped and draped in a sterile manner.  A foley catheter was placed into her bladder and attached to constant gravity.    After an adequate timeout was performed, a Pfannenstiel skin incision was made with a scalpel and carried through to the underlying layer of fascia. The fascia was incised in the midline, and this incision was extended bilaterally bluntly. The rectus muscles were separated in the midline and the peritoneum was entered bluntly. The Alexis self-retaining retractor was introduced into the abdominal cavity.    Attention was turned to the lower uterine segment where a low transverse hysterotomy was made with a scalpel and extended bilaterally bluntly.  The infant was successfully delivered, the cord was clamped and cut after one minute, and the infant was handed over to the awaiting neonatology team. Uterine massage was then administered, and the placenta delivered intact with a three-vessel cord. The uterus was then cleared of clots and debris.    The hysterotomy was closed with 0 Chromic in a running locked fashion. Single, interrupted 0 Chromic sutures were placed to help with hemostasis.  The pelvis was cleared of all clot and debris.  Hemostasis was confirmed on all surfaces.  The retractor was removed.    The peritoneum and rectus muscles were re-approximated with a 2-0 Vicryl running stitch. The fascia was then closed using 0 Vicryl in a running fashion.  The subcutaneous layer was irrigated, re-approximated with 2-0 Vicryl interrupted stitches, and the skin was closed with a 4-0 Vicryl subcuticular stitch. The patient tolerated the procedure well. Sponge, instrument and needle counts were correct x 3.  She was taken to the recovery room in stable condition.   Evalina Field, MD  OB Fellow  Faculty [facility]

## 2022-03-01 NOTE — H&P (Signed)
Melissa Guzman is a 26 y.o. female presenting for contractions that started around 2300 this evening. Patient states she was having abdominal tightening all day but, they picked up in intensity and frequency this evening. Patient states she woke up this morning and her sheets were "wet" and she's been having "leaky discharge all day." She currently denies vaginal bleeding, headache, blurred vision and signs/ symptoms of preE. She endorses + Fetal movement and pain contraction pain improved with IV Fentanyl. Planning epidural for pain management. Expecting baby girl. Planning to breast feed. Undecided on birth control at this time. Mother of Patient and patient sister at bedside as support.    OB History     Gravida  1   Para  0   Term  0   Preterm  0   AB  0   Living  0      SAB  0   IAB  0   Ectopic  0   Multiple  0   Live Births  0          Past Medical History:  Diagnosis Date   Anemia    History reviewed. No pertinent surgical history. Family History: family history is not on file. Social History:  reports that she has never smoked. She has never used smokeless tobacco. She reports that she does not currently use alcohol. She reports that she does not use drugs.     Maternal Diabetes: No Genetic Screening: Normal Silent Carrier Alpha Thalassemia  Maternal Ultrasounds/Referrals: Normal Fetal Ultrasounds or other Referrals:  None Maternal Substance Abuse:  No Significant Maternal Medications:  Meds include: Other:  Buspar 10 mg. Pt denies taking.  Significant Maternal Lab Results:  Group B Strep negative Other Comments:   Last Ultrasound at 19 weeks 2 days. EFW 287gm Anterior Placenta with a Normal AFI.   3 Vessel cord   Review of Systems Maternal Medical History:  Reason for admission: Contractions.   Contractions: Onset was 3-5 hours ago.   Frequency: regular.   Duration is approximately 60 seconds.   Perceived severity is strong.   Fetal activity: Perceived  fetal activity is normal.   Prenatal complications: No PIH, pre-eclampsia or preterm labor.    Dilation: 1 Effacement (%): 90 Station: -2 Exam by:: [employer], RN Blood pressure (!) 138/98, pulse (!) 101, temperature 98.1 F (36.7 C), resp. rate 18, height 5\' 2"  (1.575 m), weight 73.8 kg, last menstrual period 05/27/2021, SpO2 100 %. Maternal Exam:  Uterine Assessment: Contraction strength is mild.  Contraction frequency is regular.  Abdomen: Patient reports no abdominal tenderness. Introitus: Normal vulva. Vagina is positive for vaginal discharge.  External vulva noted with moisture. SVE performed. Green tinged discharge noted. No pooling.  Cervix: Cervix evaluated by sterile speculum exam.    Physical Exam Vitals reviewed. Exam conducted with a chaperone present.  Constitutional:      General: She is not in acute distress.    Appearance: She is normal weight.  HENT:     Head: Normocephalic.  Cardiovascular:     Rate and Rhythm: Normal rate.  Pulmonary:     Effort: Pulmonary effort is normal. No respiratory distress.  Abdominal:     Comments: Pregnant   Genitourinary:    General: Normal vulva.     Vagina: Vaginal discharge present.  Musculoskeletal:     Cervical back: Normal range of motion.  Skin:    General: Skin is warm and dry.  Neurological:     Mental Status:  She is alert.  Psychiatric:        Mood and Affect: Mood normal.        Behavior: Behavior normal.   FHT: 180bpm with moderate variability. Accels present. Variable deccels with quick return to baseline Cat II at this time.  Contractions q2-4 minutes   Lab Orders         CBC         Comprehensive metabolic panel         Protein / creatinine ratio, urine         CBC         RPR         Rapid urine drug screen (hospital performed)         Prenatal labs: ABO, Rh: --/--/PENDING (06/03 0039) Antibody: PENDING (06/03 0039) Rubella: 8.43 (12/08 1448) RPR: Non Reactive (03/20 0834)  HBsAg:  Negative (12/08 1448)  HIV: Non Reactive (03/20 0834)  GBS: Negative/-- (05/09 1518)   Assessment/Plan: G1P0000 at [redacted]w[redacted]d for  possible Spontaneous onset of labor.   - Plan to recheck cervix in 4 hours. If no changed assess for cytotec and balloon.   -Questionable ROM with Fetal Tachycardia. Speculum exam inconclusive. Pads applied, continue to monitor for signs of infection.   - Fetal Tachycardia. IV Bolus. Continue to monitor for sx of distress.  - Patient may have epidural upon request.  - PreE labs pending.  - GBS Negative  - Anticipate NSVD.     Claudette Head, MSN, CNM  03/01/2022, 01:45 AM

## 2022-03-01 NOTE — Progress Notes (Signed)
Melissa Guzman is a 26 y.o. G1P0000 at [redacted]w[redacted]d by LMP admitted for early labor.   Subjective: Patient is resting comfortably with epidural.   Objective: BP 103/60   Pulse 81   Temp 98.1 F (36.7 C) (Axillary)   Resp 16   Ht 5\' 2"  (1.575 m)   Wt 73.8 kg   LMP 05/27/2021   SpO2 93%   BMI 29.78 kg/m  No intake/output data recorded. No intake/output data recorded.  FHT:  FHR: 120 bpm, variability: moderate,  accelerations:  Present,  decelerations:  Present lates that resolve with position changes.  UC:   irregular, every 2-5 minutes SVE:   Dilation: 3 Effacement (%): 90 Station: -1 with BBOW Exam by:: [email or web address], CNM   Labs: Lab Results  Component Value Date   WBC 9.5 03/01/2022   HGB 11.7 (L) 03/01/2022   HCT 35.5 (L) 03/01/2022   MCV 89.6 03/01/2022   PLT 265 03/01/2022    Assessment / Plan: G1P0000 at [redacted]w[redacted]d Spontaneous labor, progressing normally. Small amount of green tinged fluid noted on towel beneath patient. Possible High Leak.   Labor:  CNM at bedside to evaluate for a balloon. Progression in cervical exam noted. Plan to reassess. If contractions space out or no progression, assess for Pitocin 2x2 and/or AROM.  Preeclampsia:   labs stable.  Fetal Wellbeing:  Category II- continue to monitor for signs of distress.  Pain Control:  Epidural I/D:   GBS Neg Anticipated MOD:  NSVD  [redacted]w[redacted]d, CNM  03/01/2022, 5:50 AM

## 2022-03-02 ENCOUNTER — Encounter (HOSPITAL_COMMUNITY): Payer: Self-pay | Admitting: Obstetrics and Gynecology

## 2022-03-02 LAB — CBC
HCT: 24.3 % — ABNORMAL LOW (ref 36.0–46.0)
Hemoglobin: 7.8 g/dL — ABNORMAL LOW (ref 12.0–15.0)
MCH: 29.3 pg (ref 26.0–34.0)
MCHC: 32.1 g/dL (ref 30.0–36.0)
MCV: 91.4 fL (ref 80.0–100.0)
Platelets: 206 10*3/uL (ref 150–400)
RBC: 2.66 MIL/uL — ABNORMAL LOW (ref 3.87–5.11)
RDW: 13.2 % (ref 11.5–15.5)
WBC: 17.8 10*3/uL — ABNORMAL HIGH (ref 4.0–10.5)
nRBC: 0 % (ref 0.0–0.2)

## 2022-03-02 MED ORDER — IBUPROFEN 600 MG PO TABS
600.0000 mg | ORAL_TABLET | Freq: Four times a day (QID) | ORAL | Status: DC
  Administered 2022-03-02 – 2022-03-05 (×11): 600 mg via ORAL
  Filled 2022-03-02 (×12): qty 1

## 2022-03-02 MED ORDER — SIMETHICONE 80 MG PO CHEW
80.0000 mg | CHEWABLE_TABLET | ORAL | Status: DC | PRN
  Administered 2022-03-05: 80 mg via ORAL

## 2022-03-02 MED ORDER — WITCH HAZEL-GLYCERIN EX PADS: 1.0000 "application " | MEDICATED_PAD | CUTANEOUS | Status: DC | PRN

## 2022-03-02 MED ORDER — PRENATAL MULTIVITAMIN CH
1.0000 | ORAL_TABLET | Freq: Every day | ORAL | Status: DC
  Administered 2022-03-03 – 2022-03-04 (×2): 1 via ORAL
  Filled 2022-03-02 (×2): qty 1

## 2022-03-02 MED ORDER — OXYCODONE HCL 5 MG PO TABS
5.0000 mg | ORAL_TABLET | ORAL | Status: DC | PRN
  Administered 2022-03-02 – 2022-03-05 (×5): 5 mg via ORAL
  Filled 2022-03-02 (×6): qty 1

## 2022-03-02 MED ORDER — OXYTOCIN-SODIUM CHLORIDE 30-0.9 UT/500ML-% IV SOLN: 2.5000 [IU]/h | INTRAVENOUS | Status: AC

## 2022-03-02 MED ORDER — ENOXAPARIN SODIUM 40 MG/0.4ML IJ SOSY
40.0000 mg | PREFILLED_SYRINGE | INTRAMUSCULAR | Status: DC
  Administered 2022-03-02 – 2022-03-03 (×2): 40 mg via SUBCUTANEOUS
  Filled 2022-03-02 (×4): qty 0.4

## 2022-03-02 MED ORDER — MENTHOL 3 MG MT LOZG
1.0000 | LOZENGE | OROMUCOSAL | Status: DC | PRN
Start: 2022-03-02 — End: 2022-03-05

## 2022-03-02 MED ORDER — KETOROLAC TROMETHAMINE 30 MG/ML IJ SOLN
30.0000 mg | Freq: Four times a day (QID) | INTRAMUSCULAR | Status: AC
  Administered 2022-03-02 (×4): 30 mg via INTRAVENOUS
  Filled 2022-03-02 (×4): qty 1

## 2022-03-02 MED ORDER — COCONUT OIL OIL: 1.0000 "application " | TOPICAL_OIL | Status: DC | PRN

## 2022-03-02 MED ORDER — FUROSEMIDE 20 MG PO TABS
20.0000 mg | ORAL_TABLET | Freq: Every day | ORAL | Status: DC
  Administered 2022-03-02 – 2022-03-05 (×4): 20 mg via ORAL
  Filled 2022-03-02 (×4): qty 1

## 2022-03-02 MED ORDER — SENNOSIDES-DOCUSATE SODIUM 8.6-50 MG PO TABS
2.0000 | ORAL_TABLET | Freq: Every day | ORAL | Status: DC
  Administered 2022-03-02 – 2022-03-03 (×2): 2 via ORAL
  Filled 2022-03-02 (×3): qty 2

## 2022-03-02 MED ORDER — SIMETHICONE 80 MG PO CHEW
80.0000 mg | CHEWABLE_TABLET | Freq: Three times a day (TID) | ORAL | Status: DC
  Administered 2022-03-02 – 2022-03-04 (×6): 80 mg via ORAL
  Filled 2022-03-02 (×8): qty 1

## 2022-03-02 MED ORDER — ACETAMINOPHEN 500 MG PO TABS
1000.0000 mg | ORAL_TABLET | Freq: Four times a day (QID) | ORAL | Status: DC
  Administered 2022-03-02 – 2022-03-05 (×13): 1000 mg via ORAL
  Filled 2022-03-02 (×15): qty 2

## 2022-03-02 MED ORDER — DIBUCAINE (PERIANAL) 1 % EX OINT: 1.0000 "application " | TOPICAL_OINTMENT | CUTANEOUS | Status: DC | PRN

## 2022-03-02 MED ORDER — DIPHENHYDRAMINE HCL 25 MG PO CAPS: 25.0000 mg | ORAL_CAPSULE | Freq: Four times a day (QID) | ORAL | Status: DC | PRN

## 2022-03-02 MED ORDER — SODIUM CHLORIDE 0.9 % IV SOLN
500.0000 mg | Freq: Once | INTRAVENOUS | Status: AC
  Administered 2022-03-02: 500 mg via INTRAVENOUS
  Filled 2022-03-02: qty 25

## 2022-03-02 MED ORDER — LACTATED RINGERS IV BOLUS
500.0000 mL | Freq: Once | INTRAVENOUS | Status: AC
  Administered 2022-03-02: 500 mL via INTRAVENOUS

## 2022-03-02 MED ORDER — LACTATED RINGERS IV SOLN: INTRAVENOUS | Status: DC

## 2022-03-02 NOTE — Anesthesia Postprocedure Evaluation (Signed)
Anesthesia Post Note  Patient: Melissa Guzman  Procedure(s) Performed: CESAREAN SECTION (Abdomen)     Patient location during evaluation: Mother Baby Anesthesia Type: Epidural Level of consciousness: oriented and awake and alert Pain management: pain level controlled Vital Signs Assessment: post-procedure vital signs reviewed and stable Respiratory status: spontaneous breathing and respiratory function stable Cardiovascular status: blood pressure returned to baseline and stable Postop Assessment: no headache, no backache, no apparent nausea or vomiting and able to ambulate Anesthetic complications: no   No notable events documented.  Last Vitals:  Vitals:   03/02/22 0853 03/02/22 1238  BP: (!) 103/58 104/60  Pulse: 68 90  Resp: 16 18  Temp: 36.8 C 37.1 C  SpO2: 98% 99%    Last Pain:  Vitals:   03/02/22 1238  TempSrc: Oral  PainSc:                  Trevor Iha

## 2022-03-02 NOTE — Lactation Note (Signed)
This note was copied from a baby's chart. Lactation Consultation Note  Patient Name: Melissa Guzman [id number] Date: 03/02/2022 Reason for consult: Follow-up assessment;Term;Primapara;1st time breastfeeding Age:24 hours   P1 mother whose infant is now 42 hours old.  This is a term baby at 39+5 weeks.  Mother's current feeding preference is breast.  Mother requested latch assistance.  Assisted to awaken "Melissa Guzman" and removed her sleeper.  Mother expressed a couple of colostrum drops which I finger fed to baby.  Noticed she had a "chomping" suck and allowed her to suck on my gloved finger for some suck training.  Assisted to latch and observed her feeding on/off for 5 minutes.  She remained very sleepy.  Demonstrated gentle stimulation and breast compressions.  Placed her STS and she fell asleep.  Encouraged to continue feeding on cue and to call her RN/LC for latch assistance as needed.    Support person present and asleep on the couch; one other visitor present.   Maternal Data Has patient been taught Hand Expression?: Yes Does the patient have breastfeeding experience prior to this delivery?: No  Feeding Mother's Current Feeding Choice: Breast Milk  LATCH Score Latch: Repeated attempts needed to sustain latch, nipple held in mouth throughout feeding, stimulation needed to elicit sucking reflex.  Audible Swallowing: None  Type of Nipple: Everted at rest and after stimulation  Comfort (Breast/Nipple): Soft / non-tender  Hold (Positioning): Assistance needed to correctly position infant at breast and maintain latch.  LATCH Score: 6   Lactation Tools Discussed/Used    Interventions Interventions: Breast feeding basics reviewed;Assisted with latch;Skin to skin;Breast massage;Hand express;Breast compression;Position options;Support pillows;Adjust position;Education  Discharge    Consult Status Consult Status: Follow-up Date: 03/03/22 Follow-up type: In-patient    Loucinda Croy R  Lalisa Kiehn 03/02/2022, 2:16 PM

## 2022-03-02 NOTE — Progress Notes (Signed)
MOB was referred for history of depression/anxiety. * Referral screened out by Clinical Social Worker because none of the following criteria appear to apply: ~ History of anxiety/depression during this pregnancy, or of post-partum depression following prior delivery. ~ Diagnosis of anxiety and/or depression within last 3 years OR * MOB's symptoms currently being treated with medication and/or therapy. MOB has an active Rx for Buspar.  Please contact the Clinical Social Worker if needs arise, by MOB request, or if MOB scores greater than 9/yes to question 10 on Edinburgh Postpartum Depression Screen.  Seymour Pavlak Boyd-Gilyard, MSW, LCSW Clinical Social Work [phone] 

## 2022-03-02 NOTE — Progress Notes (Signed)
Post Partum Day 1  Subjective: Doing well. No acute events overnight. Pain is controlled and bleeding is appropriate. She has not yet had anything to eat or drink. Her foley catheter remains in place. She has not yet ambulated. She is working on breastfeeding. She denies N/V or dizziness. She has no other concerns at this time.  Objective: Blood pressure 122/76, pulse 98, temperature 98.7 F (37.1 C), temperature source Oral, resp. rate 18, height 5\' 2"  (1.575 m), weight 73.8 kg, last menstrual period 05/27/2021, SpO2 98 %, unknown if currently breastfeeding.  Physical Exam:  General: alert, cooperative, and no distress Lochia: appropriate Uterine Fundus: firm and below umbilicus  Incision: healing well, no significant drainage, dressing is clean, dry, and intact  DVT Evaluation: no LE edema or calf tenderness to palpation   Recent Labs    03/01/22 0039 03/02/22 0439  HGB 11.7* 7.8*  HCT 35.5* 24.3*   Assessment/Plan: Melissa Guzman is a 26 y.o. G1P1001 on POD# 1 s/p pLTCS.  Progressing well. VSS. Will work on postpartum milestones today. Remove foley catheter this AM. Continue routine postpartum care.  #Acute blood loss anemia: - Hgb 11.7 > 7.8 post-operatively - No symptoms of anemia at this time  - Will give IV Venofer   #gHTN: - BP within normal limits since delivery - Will start Lasix 20 mg for 5 days  - Plan for BP check in 1 week  Feeding: Breast  Contraception: Unsure, declines for now   Dispo: Plan for discharge on POD#2-3.    LOS: 1 day   30, MD  03/02/2022, 7:43 AM

## 2022-03-02 NOTE — Lactation Note (Signed)
This note was copied from a baby's chart. Lactation Consultation Note  Patient Name: Melissa Guzman [id number] Date: 03/02/2022 Reason for consult: Initial assessment;1st time breastfeeding;Primapara;Term Age:24 hours   P1 mother whose infant is now 20 hours old.  This is a term baby at 39+5 weeks.  Mother's current feeding preference is breast.  Baby "[name]" was asleep STS on mother's chest when I arrived.  Mother reported breast feeding approximately one hour ago.  She feels like baby latched well; no discomfort noted.  Encouraged to feed 8-12 times/24 hours or sooner if baby shows cues.  Breast feeding basics reviewed.  Suggested lots of STS, breast massage and hand expression.  Offered to return at the next feeding for assistance; mother interested and will call.    Support person present and asleep on the couch.   Maternal Data Has patient been taught Hand Expression?: Yes Does the patient have breastfeeding experience prior to this delivery?: No  Feeding Mother's Current Feeding Choice: Breast Milk  LATCH Score                    Lactation Tools Discussed/Used    Interventions Interventions: Breast feeding basics reviewed;Education;LC Services brochure  Discharge    Consult Status Consult Status: Follow-up Date: 03/03/22 Follow-up type: Call as needed    Melissa Guzman 03/02/2022, 8:13 AM

## 2022-03-03 LAB — CBC WITH DIFFERENTIAL/PLATELET
Abs Immature Granulocytes: 0.33 10*3/uL — ABNORMAL HIGH (ref 0.00–0.07)
Basophils Absolute: 0 10*3/uL (ref 0.0–0.1)
Basophils Relative: 0 %
Eosinophils Absolute: 0 10*3/uL (ref 0.0–0.5)
Eosinophils Relative: 0 %
HCT: 13.7 % — ABNORMAL LOW (ref 36.0–46.0)
Hemoglobin: 4.4 g/dL — CL (ref 12.0–15.0)
Immature Granulocytes: 2 %
Lymphocytes Relative: 10 %
Lymphs Abs: 1.9 10*3/uL (ref 0.7–4.0)
MCH: 29.3 pg (ref 26.0–34.0)
MCHC: 32.1 g/dL (ref 30.0–36.0)
MCV: 91.3 fL (ref 80.0–100.0)
Monocytes Absolute: 1.1 10*3/uL — ABNORMAL HIGH (ref 0.1–1.0)
Monocytes Relative: 6 %
Neutro Abs: 16.1 10*3/uL — ABNORMAL HIGH (ref 1.7–7.7)
Neutrophils Relative %: 82 %
Platelets: 191 10*3/uL (ref 150–400)
RBC: 1.5 MIL/uL — ABNORMAL LOW (ref 3.87–5.11)
RDW: 13.5 % (ref 11.5–15.5)
WBC: 19.5 10*3/uL — ABNORMAL HIGH (ref 4.0–10.5)
nRBC: 0.5 % — ABNORMAL HIGH (ref 0.0–0.2)

## 2022-03-03 LAB — PREPARE RBC (CROSSMATCH)

## 2022-03-03 LAB — LACTIC ACID, PLASMA: Lactic Acid, Venous: 1 mmol/L (ref 0.5–1.9)

## 2022-03-03 LAB — BIRTH TISSUE RECOVERY COLLECTION (PLACENTA DONATION)

## 2022-03-03 MED ORDER — SODIUM CHLORIDE 0.9% IV SOLUTION: Freq: Once | INTRAVENOUS | Status: AC

## 2022-03-03 MED ORDER — PIPERACILLIN-TAZOBACTAM 3.375 G IVPB
3.3750 g | Freq: Three times a day (TID) | INTRAVENOUS | Status: DC
  Administered 2022-03-03 – 2022-03-04 (×3): 3.375 g via INTRAVENOUS
  Filled 2022-03-03 (×6): qty 50

## 2022-03-03 MED ORDER — LACTATED RINGERS IV BOLUS
500.0000 mL | Freq: Once | INTRAVENOUS | Status: AC
Start: 2022-03-03 — End: 2022-03-03
  Administered 2022-03-03: 500 mL via INTRAVENOUS

## 2022-03-03 MED ORDER — DIPHENHYDRAMINE HCL 50 MG/ML IJ SOLN
25.0000 mg | Freq: Once | INTRAMUSCULAR | Status: AC
  Administered 2022-03-03: 25 mg via INTRAVENOUS
  Filled 2022-03-03: qty 1

## 2022-03-03 NOTE — Lactation Note (Signed)
This note was copied from a baby's chart. Lactation Consultation Note  Patient Name: Melissa Guzman [id number] Date: 03/03/2022 Reason for consult: Follow-up assessment Age:24 hours  P1 mother whose infant is now 65 hours old.  This is a term baby at 39+5 weeks.  Mother's current feeding preference is breast.  Baby "Melissa Guzman" was asleep in mother's arms when I arrived.  Mother stated that she has been feeding well and she can hear swallowing.  Baby has been voiding/stooling.  Stools are transitioning and are now brown in color.  Mother is being discharged home today as long as baby can be discharged; awaiting pediatrician's visit.  Reviewed feeding plan for after discharge.  Mother has no questions/concerns at this time.  She has our OP phone number for any questions after discharge.  No support person present at this time.     Maternal Data    Feeding    LATCH Score                    Lactation Tools Discussed/Used    Interventions Interventions: Breast feeding basics reviewed;Education  Discharge Discharge Education: Engorgement and breast care  Consult Status Consult Status: Complete Date: 03/03/22 Follow-up type: Call as needed    Melissa Guzman 03/03/2022, 1:28 PM

## 2022-03-03 NOTE — Progress Notes (Signed)
POSTPARTUM PROGRESS NOTE  POD #2  Subjective:  Melissa Guzman is a 26 y.o. G1P1001 s/p pLTCS at [redacted]w[redacted]d.  She reports she doing well. No acute events overnight. She reports she is doing well. She denies any problems with ambulating, voiding or po intake. Denies nausea or vomiting. She has passed flatus. Pain is moderately controlled.  Lochia is less than a period.  Objective: Blood pressure 109/63, pulse 73, temperature 98.2 F (36.8 C), temperature source Oral, resp. rate 16, height 5\' 2"  (1.575 m), weight 73.8 kg, last menstrual period 05/27/2021, SpO2 99 %, unknown if currently breastfeeding.  Physical Exam:  General: alert, cooperative and no distress Chest: no respiratory distress Heart:regular rate, distal pulses intact Abdomen: soft, nontender,  Uterine Fundus: firm, appropriately tender DVT Evaluation: No calf swelling or tenderness Extremities: no LE edema Skin: warm, dry; incision clean/dry/intact w/ honeycomb dressing in place  Recent Labs    03/01/22 0039 03/02/22 0439  HGB 11.7* 7.8*  HCT 35.5* 24.3*    Assessment/Plan: Melissa Guzman is a 26 y.o. G1P1001 s/p pLTCS at [redacted]w[redacted]d for fetal intolerance of labor remote from delivery.  POD#2 - Doing welll; pain moderately well controlled, encouraged use of PRN oxycodone. H/H appropriate  Routine postpartum care  OOB, ambulated  Lovenox for VTE prophylaxis Anemia: asymptomatic, s/p IV venofer Contraception: no plan at present, counseled to consider any option Feeding: breast, going well gHTN: normotensive to date, on lasix  Dispo: Plan for discharge today if baby is able to, otherwise will remain inpatient until tomorrow.   LOS: 2 days   Clarnce Flock, MD/MPH Attending Family Medicine Physician, [facility] for [facility] [place] [facility]   03/03/2022, 12:30 PM

## 2022-03-03 NOTE — Progress Notes (Signed)
Subjective: Postpartum Day 2: Cesarean Delivery Patient reports + flatus, + BM, and no problems voiding.  New onset "feeling unwell", poor appetite today, feels as if her heart is racing  Objective: Vital signs in last 24 hours: Temp:  [98 F (36.7 C)-101.3 F (38.5 C)] 101.3 F (38.5 C) (06/05 2210) Pulse Rate:  [73-147] 147 (06/05 2210) Resp:  [16] 16 (06/05 0500) BP: (109-131)/(63-70) 131/63 (06/05 2210) SpO2:  [99 %] 99 % (06/05 2210)  Physical Exam:  General: alert, fatigued, and no distress Lochia: appropriate Uterine Fundus: firm Incision: Honeycomb in place and well applied DVT Evaluation: No evidence of DVT seen on physical exam.  Recent Labs    03/01/22 0039 03/02/22 0439  HGB 11.7* 7.8*  HCT 35.5* 24.3*    Assessment/Plan: New onset fever, abdominal tenderness, tachycardia, indication for sepsis protocol. Orders placed accordingly. Discussed with Dr. Elly Modena, will add IV Zosyn and reassess.  Darlina Rumpf, [place], MSN, CNM 03/03/2022, 10:41 PM

## 2022-03-03 NOTE — Progress Notes (Signed)
Patient keeps putting the wrong BP measurements on the [employer] app. I took pressure at 1658 and it was 120/70.  However the app is putting in a measurment, I will tell patient that is must be accidentally pushing the wrong numbers on her phone when she tries to plug BPS in.

## 2022-03-03 NOTE — Consult Note (Signed)
Melissa Guzman is a 26 y.o. female admitted on 02/28/2022 11:49 PM  with  endometritis s/p C-section . Pharmacy has been consulted for Zosyn dosing.  Plan: Zosyn 3.375g IV q8h (4 hour infusion).  Ht Readings from Last 1 Encounters:  03/01/22 5\' 2"  (1.575 m)    73.8 kg (162 lb 12.8 oz)  Ideal body weight: 50.1 kg (110 lb 7.2 oz) Adjusted ideal body weight: 59.6 kg (131 lb 6.3 oz)   Temp: 101.3 F (38.5 C) (06/05 2210) Temp Source: Oral (06/05 2210) BP: 131/63 (06/05 2210) Pulse Rate: 147 (06/05 2210)   Recent Labs    03/02/22 0439  WBC 17.8*   Estimated Creatinine Clearance: 102.9 mL/min (by C-G formula based on SCr of 0.56 mg/dL).  Allergies: Patient has no known allergies.   Antimicrobials this admission: Cefazolin, Azithromycin pre-op OT doses     Thank you for allowing pharmacy to be a part of this patient's care.  05/02/22 03/03/2022 11:05 PM

## 2022-03-03 NOTE — Social Work (Signed)
CSW received consult for Edinburgh 12.  CSW met with MOB to offer support and complete assessment.    CSW met with MOB at bedside and introduced CSW role. CSW observed MOB asleep with infant lying on her chest. MOB awakened at the sound of her name. CSW educated MOB about placing the infant in crib while she slept. MOB reported understanding and placed the infant in the crib. MOB presented calm and receptive to [place] visit. CSW inquired how MOB has felt since giving birth. MOB reported feeling "okay, still having some pain" and shared the L&D " was an experience." MOB reported overall she felt emotionally stable during the pregnancy. CSW discussed MOB [place]. MOB became tearful and expressed that she has been feeling a little overwhelmed given that this is her first time being a mom. MOB shared that she and FOB are currently separated but identified her mom and sister as supports. MOB expressed that she had anxiety throughout the pregnancy and found comfort in praying since she could not have an alcoholic drink which sometimes helps her cope. CSW acknowledged MOB concerns and validated her emotions. CSW discussed and led MOB in exploring additional coping strategies. MOB reported she was prescribed Zoloft and Hydroxyzine for her anxiety symptoms during the pregnancy however she stopped taking the medication because she did not like the way it made her feel. MOB reported she was prescribed Buspar and open to taking the medication now that she had baby. MOB reported she spoke with [facility] counselor Roselyn Reef during the pregnancy a few times. MOB reported she is not interested in seeing a therapist ongoing at this time. CSW discussed PPD symptoms, provided education regarding the baby blues period vs. perinatal mood disorders, discussed treatment and gave resources for mental health follow up if concerns arise.  CSW recommended MOB complete a self-evaluation during the postpartum time period using the New Mom Checklist from  Postpartum Progress and encouraged MOB to contact a medical professional if symptoms are noted at any time. MOB reported she feels comfortable reaching out to her doctor if she has concerns. CSW assessed MOB for safety. MOB denied thoughts of harm to self and others. MOB denied domestic violence concerns.   CSW provided review of Sudden Infant Death Syndrome (SIDS) precautions. MOB reported she has items for the infant including a bassinet where the infant will sleep. MOB has chosen [employer] [facility] program for the infant's follow up care. CSW educated MOB about [employer]. MOB gave CSW permission to make a referral. CSW provided MOB with a comprehensive list of [facility] resources for First Time Parents. MOB was appreciative. CSW assessed MOB for additional needs. MOB reported no further need.   CSW identifies no further need for intervention and no barriers to discharge at this time.  Kathrin Greathouse, MSW, LCSW Women's and [place] Worker  [phone] 03/03/2022  12:14 PM

## 2022-03-04 LAB — COMPREHENSIVE METABOLIC PANEL
ALT: 10 U/L (ref 0–44)
AST: 16 U/L (ref 15–41)
Albumin: 1.9 g/dL — ABNORMAL LOW (ref 3.5–5.0)
Alkaline Phosphatase: 79 U/L (ref 38–126)
Anion gap: 8 (ref 5–15)
BUN: 7 mg/dL (ref 6–20)
CO2: 23 mmol/L (ref 22–32)
Calcium: 7.9 mg/dL — ABNORMAL LOW (ref 8.9–10.3)
Chloride: 108 mmol/L (ref 98–111)
Creatinine, Ser: 0.67 mg/dL (ref 0.44–1.00)
GFR, Estimated: >60 mL/min (ref >60)
Glucose, Bld: 88 mg/dL (ref 70–99)
Potassium: 3 mmol/L — ABNORMAL LOW (ref 3.5–5.1)
Sodium: 139 mmol/L (ref 135–145)
Total Bilirubin: 0.4 mg/dL (ref 0.3–1.2)
Total Protein: 4.7 g/dL — ABNORMAL LOW (ref 6.5–8.1)

## 2022-03-04 LAB — CBC
HCT: 19.2 % — ABNORMAL LOW (ref 36.0–46.0)
HCT: 25.3 % — ABNORMAL LOW (ref 36.0–46.0)
Hemoglobin: 6.4 g/dL — CL (ref 12.0–15.0)
Hemoglobin: 9 g/dL — ABNORMAL LOW (ref 12.0–15.0)
MCH: 29.6 pg (ref 26.0–34.0)
MCH: 31.1 pg (ref 26.0–34.0)
MCHC: 33.3 g/dL (ref 30.0–36.0)
MCHC: 35.6 g/dL (ref 30.0–36.0)
MCV: 88.9 fL (ref 80.0–100.0)
MCV: 87.5 fL (ref 80.0–100.0)
Platelets: 186 10*3/uL (ref 150–400)
Platelets: 201 10*3/uL (ref 150–400)
RBC: 2.16 MIL/uL — ABNORMAL LOW (ref 3.87–5.11)
RBC: 2.89 MIL/uL — ABNORMAL LOW (ref 3.87–5.11)
RDW: 13.2 % (ref 11.5–15.5)
RDW: 14 % (ref 11.5–15.5)
WBC: 20.6 10*3/uL — ABNORMAL HIGH (ref 4.0–10.5)
WBC: 21.8 10*3/uL — ABNORMAL HIGH (ref 4.0–10.5)
nRBC: 0.4 % — ABNORMAL HIGH (ref 0.0–0.2)
nRBC: 0.5 % — ABNORMAL HIGH (ref 0.0–0.2)

## 2022-03-04 LAB — PREPARE RBC (CROSSMATCH)

## 2022-03-04 MED ORDER — NIFEDIPINE ER OSMOTIC RELEASE 30 MG PO TB24
30.0000 mg | ORAL_TABLET | Freq: Every day | ORAL | Status: DC
  Administered 2022-03-04 – 2022-03-05 (×2): 30 mg via ORAL
  Filled 2022-03-04 (×2): qty 1

## 2022-03-04 MED ORDER — SODIUM CHLORIDE 0.9% IV SOLUTION: Freq: Once | INTRAVENOUS | Status: AC

## 2022-03-04 MED ORDER — POTASSIUM CHLORIDE CRYS ER 20 MEQ PO TBCR
40.0000 meq | EXTENDED_RELEASE_TABLET | ORAL | Status: AC
  Administered 2022-03-04 (×2): 40 meq via ORAL
  Filled 2022-03-04 (×2): qty 2

## 2022-03-04 NOTE — Progress Notes (Addendum)
CRITICAL VALUE STICKER  CRITICAL VALUE: HgB 4.4  RECEIVER (on-site recipient of call): Doran Heater, RN  DATE & TIME NOTIFIED: 03/03/22 2320  MESSENGER (representative from lab):  MD NOTIFIED: Leticia Penna  TIME OF NOTIFICATION: 2322  RESPONSE: Will transfuse 2 units pRBC

## 2022-03-04 NOTE — Progress Notes (Signed)
Post Operative Day 3  Subjective: Doing well. Feeling much better after her blood transfusions. Eating and drinking well. Ambulating and voiding without issue. Passing flatus and has had normal bowel movements. Reports just pressure in her abdomen at times. Denies pain and reports that her bleeding is appropriate. She has no other concerns at this time.   Objective: Blood pressure 127/89, pulse (!) 102, temperature 98.3 F (36.8 C), temperature source Oral, resp. rate 18, height 5\' 2"  (1.575 m), weight 73.8 kg, last menstrual period 05/27/2021, SpO2 99 %, unknown if currently breastfeeding.  Physical Exam:  General: alert, cooperative, and no distress Lochia: appropriate Uterine Fundus: firm and below umbilicus  Incision: healing well, no significant drainage, dressing is clean/dry/intact  DVT Evaluation: no LE edema or calf tenderness to palpation   Recent Labs    03/04/22 0423 03/04/22 1444  HGB 6.4* 9.0*  HCT 19.2* 25.3*    Assessment/Plan: Belanna Manring is a 26 y.o. G1P1001 on POD# 3 s/p pLTCS.  Progressing well. Meeting postpartum milestones. VSS. Continue routine postpartum care.  #Postpartum endometritis:  - Started on Zosyn overnight - Has remained afebrile since starting treatment - Will continue for 24 hours from last fever (last dose infusing at this time) - Will continue to monitor closely   #Acute blood loss anemia:  - S/p 4 units pRBCs - Hgb now 9.0 - No symptoms of anemia at this time - Will plan to repeat CBC in AM  #Hypokalemia: - Last K of 3.0 - On Lasix due to HTN protocol - 40 mEq given x 2 - Will repeat CMP in AM  #gHTN: - BP within normal limits - On Lasix 20 mg daily - Will continue to monitor while inpatient   Feeding: Breast  Contraception: Undecided  Dispo: Plan for discharge on POD#4 pending clinical status.    LOS: 3 days   30, MD  03/04/2022, 8:21 PM

## 2022-03-04 NOTE — Progress Notes (Signed)
Notified of repeat CBC of 6.4 s/p transfusion of 2 units PRBCs. VSS, patient feeling better. Discussed with Dr. Jolayne Panther. Will order transfusion of two additional units.   CNM at bedside. Patient resting in bed performing skin-to-skin with newborn. Discussed indication for additional units PRBCs, lab work reviewed, discussed plan for another CBC after transfusion of two more units. Patient verbalizes agreement with plan, denies questions or concerns.  Clayton Bibles, MSA, MSN, CNM Certified Nurse Midwife, Biochemist, clinical for [employer], [facility]

## 2022-03-04 NOTE — Lactation Note (Signed)
This note was copied from a baby's chart. Lactation Consultation Note  Patient Name: Melissa Guzman [id number] Date: 03/04/2022 Reason for consult: Follow-up assessment;Infant weight loss;Other (Comment);Term;Breastfeeding assistance (9 % weight loss. LC reviewed/ updated the doc flow sheets. Voids/ stools correlate w/ weight loss. Due to moms significant blood loss, baby now being under 6 pounds, suppplementing and post pumping indicated.) Age:24 hours As LC entered the room baby latched shallow wrapped in blanket and outfit.  Baby released from the breast . LC changed a wet diaper. And undressed, assisted to relatch on the right breast / football and worked on depth. Baby fed for 8 mins with multiple swallows and sustained depth. Per mom comfortable.  Baby is on the D/C list, LC will F/U with mom if D/C.  Maternal Data Has patient been taught Hand Expression?: Yes (steady flow of colostrum)  Feeding Mother's Current Feeding Choice: Breast Milk and Formula Nipple Type: Slow - flow  LATCH Score Latch: Grasps breast easily, tongue down, lips flanged, rhythmical sucking.  Audible Swallowing: Spontaneous and intermittent  Type of Nipple: Everted at rest and after stimulation  Comfort (Breast/Nipple): Filling, red/small blisters or bruises, mild/mod discomfort  Hold (Positioning): Assistance needed to correctly position infant at breast and maintain latch.  LATCH Score: 8   Lactation Tools Discussed/Used    Interventions Interventions: Breast feeding basics reviewed;Assisted with latch;Skin to skin;Breast massage;Hand express;Breast compression;Adjust position;Support pillows;Position options;Education;LC Services brochure  Discharge    Consult Status Consult Status: Follow-up Date: 03/04/22 Follow-up type: In-patient    Matilde Sprang Nickia Boesen 03/04/2022, 8:58 AM

## 2022-03-04 NOTE — Progress Notes (Signed)
CRITICAL VALUE STICKER  CRITICAL VALUE: 6.4  RECEIVER (on-site recipient of call): Doran Heater, RN  DATE & TIME NOTIFIED: 03/04/22 0520  MESSENGER (representative from lab):   MD NOTIFIED: Clayton Bibles, CNM  TIME OF NOTIFICATION: 0522  RESPONSE: 2 additional units of pRBCs

## 2022-03-05 ENCOUNTER — Other Ambulatory Visit (HOSPITAL_COMMUNITY): Payer: Self-pay

## 2022-03-05 ENCOUNTER — Encounter: Payer: Self-pay | Admitting: Family Medicine

## 2022-03-05 ENCOUNTER — Other Ambulatory Visit: Payer: Medicaid Other

## 2022-03-05 DIAGNOSIS — N719 Inflammatory disease of uterus, unspecified: Secondary | ICD-10-CM | POA: Diagnosis not present

## 2022-03-05 HISTORY — DX: Inflammatory disease of uterus, unspecified: N71.9

## 2022-03-05 LAB — CBC WITH DIFFERENTIAL/PLATELET
Abs Immature Granulocytes: 0.32 10*3/uL — ABNORMAL HIGH (ref 0.00–0.07)
Basophils Absolute: 0 10*3/uL (ref 0.0–0.1)
Basophils Relative: 0 %
Eosinophils Absolute: 0.2 10*3/uL (ref 0.0–0.5)
Eosinophils Relative: 1 %
HCT: 25.2 % — ABNORMAL LOW (ref 36.0–46.0)
Hemoglobin: 8.5 g/dL — ABNORMAL LOW (ref 12.0–15.0)
Immature Granulocytes: 2 %
Lymphocytes Relative: 8 %
Lymphs Abs: 1.5 10*3/uL (ref 0.7–4.0)
MCH: 29.8 pg (ref 26.0–34.0)
MCHC: 33.7 g/dL (ref 30.0–36.0)
MCV: 88.4 fL (ref 80.0–100.0)
Monocytes Absolute: 1 10*3/uL (ref 0.1–1.0)
Monocytes Relative: 5 %
Neutro Abs: 15.6 10*3/uL — ABNORMAL HIGH (ref 1.7–7.7)
Neutrophils Relative %: 84 %
Platelets: 251 10*3/uL (ref 150–400)
RBC: 2.85 MIL/uL — ABNORMAL LOW (ref 3.87–5.11)
RDW: 14.2 % (ref 11.5–15.5)
WBC: 18.6 10*3/uL — ABNORMAL HIGH (ref 4.0–10.5)
nRBC: 0.9 % — ABNORMAL HIGH (ref 0.0–0.2)

## 2022-03-05 LAB — COMPREHENSIVE METABOLIC PANEL
ALT: 12 U/L (ref 0–44)
AST: 18 U/L (ref 15–41)
Albumin: 2 g/dL — ABNORMAL LOW (ref 3.5–5.0)
Alkaline Phosphatase: 84 U/L (ref 38–126)
Anion gap: 9 (ref 5–15)
BUN: 5 mg/dL — ABNORMAL LOW (ref 6–20)
CO2: 22 mmol/L (ref 22–32)
Calcium: 7.9 mg/dL — ABNORMAL LOW (ref 8.9–10.3)
Chloride: 106 mmol/L (ref 98–111)
Creatinine, Ser: 0.68 mg/dL (ref 0.44–1.00)
GFR, Estimated: >60 mL/min (ref >60)
Glucose, Bld: 83 mg/dL (ref 70–99)
Potassium: 3.2 mmol/L — ABNORMAL LOW (ref 3.5–5.1)
Sodium: 137 mmol/L (ref 135–145)
Total Bilirubin: 0.6 mg/dL (ref 0.3–1.2)
Total Protein: 4.9 g/dL — ABNORMAL LOW (ref 6.5–8.1)

## 2022-03-05 LAB — BPAM RBC
Blood Product Expiration Date: 202306242359
Blood Product Expiration Date: 202306242359
Blood Product Expiration Date: 202306252359
Blood Product Expiration Date: 202306252359
ISSUE DATE / TIME: 202306060000
ISSUE DATE / TIME: 202306060215
ISSUE DATE / TIME: 202306060548
ISSUE DATE / TIME: 202306061010
Unit Type and Rh: 5100
Unit Type and Rh: 5100
Unit Type and Rh: 5100
Unit Type and Rh: 5100

## 2022-03-05 LAB — TYPE AND SCREEN
ABO/RH(D): O POS
Antibody Screen: NEGATIVE
Unit division: 0
Unit division: 0
Unit division: 0
Unit division: 0

## 2022-03-05 MED ORDER — NIFEDIPINE ER 30 MG PO TB24
30.0000 mg | ORAL_TABLET | Freq: Every day | ORAL | 0 refills | Status: DC
  Filled 2022-03-05: qty 60, 60d supply, fill #0

## 2022-03-05 MED ORDER — ACETAMINOPHEN 500 MG PO TABS
1000.0000 mg | ORAL_TABLET | Freq: Three times a day (TID) | ORAL | 0 refills | Status: DC | PRN
  Filled 2022-03-05: qty 60, 10d supply, fill #0

## 2022-03-05 MED ORDER — OXYCODONE HCL 5 MG PO TABS
5.0000 mg | ORAL_TABLET | Freq: Four times a day (QID) | ORAL | 0 refills | Status: DC | PRN
Start: 2022-03-05 — End: 2022-03-06
  Filled 2022-03-05: qty 12, 3d supply, fill #0

## 2022-03-05 MED ORDER — FUROSEMIDE 20 MG PO TABS
20.0000 mg | ORAL_TABLET | Freq: Every day | ORAL | 0 refills | Status: DC
  Filled 2022-03-05: qty 1, 1d supply, fill #0

## 2022-03-05 MED ORDER — IBUPROFEN 600 MG PO TABS
600.0000 mg | ORAL_TABLET | Freq: Four times a day (QID) | ORAL | 0 refills | Status: DC | PRN
  Filled 2022-03-05: qty 40, 10d supply, fill #0

## 2022-03-05 MED ORDER — POTASSIUM CHLORIDE CRYS ER 20 MEQ PO TBCR
40.0000 meq | EXTENDED_RELEASE_TABLET | ORAL | Status: DC
  Administered 2022-03-05: 40 meq via ORAL
  Filled 2022-03-05 (×2): qty 2

## 2022-03-05 NOTE — Lactation Note (Signed)
This note was copied from a baby's chart. Lactation Consultation Note  Patient Name: Melissa Guzman [id number] Date: 03/05/2022 Reason for consult: Follow-up assessment;Mother's request;Primapara;1st time breastfeeding;Term;Infant weight loss;Breastfeeding assistance;Other (Comment) (Anemia ( multiple transfusions)) Age:24 days Mom ready to be discharged at home. Infant latching with no signs of nipple trauma. Mom not been pumping. LC encouraged Mom following transfusion can affect her milk supply to start pumping with personal pump at home.   Plan 1. To feed based on cues 8-12x 24 hr period. Mom to offer breasts and look for signs of milk transfer.  2. Mom to supplement with EBM first followed by formula with pace bottle feeding and slow flow nipple. Mom encouraged to increase supplementation volume and BF supplementation guide provided.  3. Post pumping after latching with personal electric pump for 15 min on initial setting.  All questions answered at the end of the visit.   Maternal Data Has patient been taught Hand Expression?: Yes  Feeding Mother's Current Feeding Choice: Breast Milk and Formula  LATCH Score                    Lactation Tools Discussed/Used Tools: Pump;Flanges Flange Size: 21;24 Breast pump type: Other (comment);Manual (personal pump) Pump Education: Setup, frequency, and cleaning;Milk Storage Reason for Pumping: increase stimulation Pumping frequency: after each feeding for 15 min on initial setting.  Interventions Interventions: Breast feeding basics reviewed;Hand express;Expressed milk;Coconut oil;DEBP;Education;Pace feeding;LC Magazine features editor;Infant Driven Feeding Algorithm education  Discharge Discharge Education: Engorgement and breast care;Warning signs for feeding baby;Outpatient recommendation;Outpatient Epic message sent Pump: Manual;Personal WIC Program: Yes  Consult Status Consult Status: Complete Date: 03/05/22    Devynne Sturdivant   Nicholson-Springer 03/05/2022, 1:16 PM

## 2022-03-05 NOTE — Lactation Note (Signed)
This note was copied from a baby's chart. Lactation Consultation Note RN wanted LC to see mom. Mom was awake so LC attempted visit. Mom stated she had already BF baby and then just supplemented baby formula. Mentioned to mom that since she lost so much blood LC thought it would be good if mom used DEBP for extra stimulation. Mom stated that is fine but she wants to wait until in the morning because she is tired. LC that's fine. Lactation will f/u w/her today. LC gave mom paper on supplementing at hours of age to increase supplement.  Patient Name: Melissa GuzmanT Date: 03/05/2022   Age:24 hours  Maternal Data    Feeding    LATCH Score Latch: Repeated attempts needed to sustain latch, nipple held in mouth throughout feeding, stimulation needed to elicit sucking reflex.  Audible Swallowing: Spontaneous and intermittent  Type of Nipple: Everted at rest and after stimulation  Comfort (Breast/Nipple): Filling, red/small blisters or bruises, mild/mod discomfort  Hold (Positioning): Assistance needed to correctly position infant at breast and maintain latch.  LATCH Score: 7   Lactation Tools Discussed/Used    Interventions    Discharge    Consult Status      Shaine Mount G 03/05/2022, 3:00 AM

## 2022-03-05 NOTE — Progress Notes (Signed)
Patient is complaining of a lot of gas. Patient encouraged to walk halls. Patient was in pain I medicated. Patient denies dizzyness or any other complaints at this time. I specifically told her if she has any issues or concerns to let me know.  NO other complaints at this time.

## 2022-03-05 NOTE — Progress Notes (Signed)
Patient had babyscripts set up on 6/5. I could see the data in the epic. She already has an account

## 2022-03-05 NOTE — BH Specialist Note (Signed)
Integrated Behavioral Health via Telemedicine Visit  03/05/2022 Melissa Guzman [id number]  Number of Integrated Behavioral Health Clinician visits: 4- Fourth Visit  Session Start time: 1623   Session End time: 1651  Total time in minutes: 28   Referring Provider: Federico Flake, MD Patient/Family location: [facility] Provider location: [facility] for [facility]  All persons participating in visit: [name] and [facility] Juston Goheen   Types of Service: Individual psychotherapy and Telephone visit  I connected with Melissa Guzman and/or Melissa Guzman's  n/a  via  Telephone or Video Enabled Telemedicine Application  (Video is Caregility application) and verified that I am speaking with the correct person using two identifiers. Discussed confidentiality: Yes   I discussed the limitations of telemedicine and the availability of in person appointments.  Discussed there is a possibility of technology failure and discussed alternative modes of communication if that failure occurs.  I discussed that engaging in this telemedicine visit, they consent to the provision of behavioral healthcare and the services will be billed under their insurance.  Patient and/or legal guardian expressed understanding and consented to Telemedicine visit: Yes   Presenting Concerns: Patient and/or family reports the following symptoms/concerns: Adjusting to new motherhood  after emergency cesarean and then return to hospital after initial discharge; pt feels well-supported by mother and sister. Duration of problem: Postpartum (03/01/22 birth)  Patient and/or Family's Strengths/Protective Factors: Social connections, Concrete supports in place (healthy food, safe environments, etc.), and Sense of purpose  Goals Addressed: Patient will:  Increase knowledge and/or ability of: healthy habits   Demonstrate ability to: Increase healthy adjustment to current life  circumstances  Progress towards Goals: Ongoing  Interventions: Interventions utilized:  Functional Assessment of ADLs and Supportive Reflection Standardized Assessments completed:  PHQ9, GAD7; [place] all given within two weeks previous  Patient and/or Family Response: Patient agrees with treatment plan.  Assessment: Patient currently experiencing Adjustment disorder with mixed anxious and depressed mood.   Patient may benefit from brief therapeutic intervention today; f/u in two weeks after out of hospital.  Plan: Follow up with behavioral health clinician on : Two weeks; Call Asher Muir at [phone], as needed Behavioral recommendations:  -Continue following medical advise at discharge from hospital -Prioritize healthy self-care via routine sleep and meals as discussed (With emphasis on sleeping when baby sleeps closer to 4th of July, in case fireworks disrupt sleep at night) Referral(s): Integrated [employer] (In Clinic)  I discussed the assessment and treatment plan with the patient and/or parent/guardian. They were provided an opportunity to ask questions and all were answered. They agreed with the plan and demonstrated an understanding of the instructions.   They were advised to call back or seek an in-person evaluation if the symptoms worsen or if the condition fails to improve as anticipated.  Rae Lips, LCSW     02/26/2022    3:33 PM 02/10/2022    4:31 PM 12/30/2021   11:41 AM 12/16/2021    8:50 AM 12/02/2021   10:17 AM  Depression screen PHQ 2/9  Decreased Interest 3 1 1  0 1  Down, Depressed, Hopeless 2 0 1 0 1  PHQ - 2 Score 5 1 2  0 2  Altered sleeping 1 0 1 0 0  Tired, decreased energy 3 1 1 1 1   Change in appetite 1 3 0 0 1  Feeling bad or failure about yourself  1 0 1 0 0  Trouble concentrating 1 0  1 0 0  Moving slowly or fidgety/restless 0 0 0 0 0  Suicidal thoughts 0 0 0 0 0  PHQ-9 Score 12 5 6 1 4   Difficult doing work/chores     Not  difficult at all      02/26/2022    3:33 PM 02/10/2022    4:35 PM 12/30/2021   11:42 AM 12/16/2021    8:50 AM  GAD 7 : Generalized Anxiety Score  Nervous, Anxious, on Edge 2 1 2 1   Control/stop worrying 2 1 2 1   Worry too much - different things 2 1 2 1   Trouble relaxing 2 0 2 1  Restless 1 0 0 0  Easily annoyed or irritable 2 1 2 1   Afraid - awful might happen 2 0 2 1  Total GAD 7 Score 13 4 12 6       03/02/2022    5:45 PM  Edinburgh Postnatal Depression Scale Screening Tool  I have been able to laugh and see the funny side of things. 0  I have looked forward with enjoyment to things. 0  I have blamed myself unnecessarily when things went wrong. 2  I have been anxious or worried for no good reason. 3  I have felt scared or panicky for no good reason. 2  Things have been getting on top of me. 2  I have been so unhappy that I have had difficulty sleeping. 1  I have felt sad or miserable. 1  I have been so unhappy that I have been crying. 1  The thought of harming myself has occurred to me. 0  Edinburgh Postnatal Depression Scale Total 12

## 2022-03-06 ENCOUNTER — Encounter: Payer: Self-pay | Admitting: Family Medicine

## 2022-03-06 ENCOUNTER — Ambulatory Visit (INDEPENDENT_AMBULATORY_CARE_PROVIDER_SITE_OTHER): Payer: Medicaid Other | Admitting: Family Medicine

## 2022-03-06 VITALS — BP 144/89 | HR 111

## 2022-03-06 DIAGNOSIS — O139 Gestational [pregnancy-induced] hypertension without significant proteinuria, unspecified trimester: Secondary | ICD-10-CM | POA: Diagnosis not present

## 2022-03-06 DIAGNOSIS — F419 Anxiety disorder, unspecified: Secondary | ICD-10-CM

## 2022-03-06 DIAGNOSIS — F32A Depression, unspecified: Secondary | ICD-10-CM

## 2022-03-06 DIAGNOSIS — Z98891 History of uterine scar from previous surgery: Secondary | ICD-10-CM | POA: Diagnosis not present

## 2022-03-06 DIAGNOSIS — R1030 Lower abdominal pain, unspecified: Secondary | ICD-10-CM | POA: Diagnosis not present

## 2022-03-06 MED ORDER — AMOXICILLIN-POT CLAVULANATE 875-125 MG PO TABS: 1.0000 | ORAL_TABLET | Freq: Two times a day (BID) | ORAL | 0 refills | Status: DC

## 2022-03-06 MED ORDER — OXYCODONE HCL 5 MG PO TABS: 5.0000 mg | ORAL_TABLET | Freq: Four times a day (QID) | ORAL | 0 refills | Status: DC | PRN

## 2022-03-06 NOTE — Progress Notes (Signed)
    Subjective:    Melissa Guzman is a 26 y.o. female who presents to the clinic status post pLTCS on 03/01/22. Pain is not well controlled.  Medications being used: acetaminophen, ibuprofen (OTC), and narcotic analgesics including oxycodone (Oxycontin, Oxyir).  Eating a regular diet without difficulty. Bowel movements are normal. No other significant postoperative concerns.  Having consent abdominal pain, "feels like UTI" and reports vaginal irritation and pain with urination.   The following portions of the patient's history were reviewed and updated as appropriate: allergies, current medications, past family history, past medical history, past social history, past surgical history, and problem list..   Review of Systems Pertinent items are noted in HPI.   Objective:  BP (!) 144/89   Pulse (!) 111   LMP 05/27/2021   LMP 05/27/2021  Constitutional:  Well-developed, well-nourished female in no acute distress. Blunt affect with poor eye contact overall  Skin: Skin is warm and dry, no rash noted, not diaphoretic,no erythema, no pallor.  Cardiovascular: Normal heart rate noted  Respiratory: Effort and breath sounds normal, no problems with respiration noted  Abdomen: Soft, bowel sounds active, non-tender, no abnormal masses  Incision: Healing well, no drainage, no erythema, no hernia, no seroma, no swelling, no dehiscence, incision well approximated  Pelvic:   Deferred   Assessment:   Doing well postoperatively.  Having generalized lower abdominal pain. Concern for UTI    Plan:   1. Continue any current medications. 2. Wound care discussed. Honey comb in place, clean and dry.  3. Activity restrictions: no bending, stooping, or squatting 4. Anticipated return to work: not applicable.   1. Lower abdominal pain Suspect possible UTI- will treat as such empirically and chose augmentin for endometritis coverage as well.  Given additional 5 percocet to allow usage over the weekend. Has 9  tablets left currently.  - oxyCODONE (OXY IR/ROXICODONE) 5 MG immediate release tablet; Take 1 tablet (5 mg total) by mouth every 6 (six) hours as needed for severe pain or breakthrough pain.  Dispense: 5 tablet; Refill: 0 - amoxicillin-clavulanate (AUGMENTIN) 875-125 MG tablet; Take 1 tablet by mouth 2 (two) times daily for 7 days.  Dispense: 14 tablet; Refill: 0 - Urine Culture  2. Gestational HTN - BP slightly elevated but also in pain after CS - On procardia- continue to take - Finishing lasix - BP check on 6/12  3. Anxiety/Depression - has BH follow up  - blunted affect today, continue to monitor closely for PPD/PPA  Please refer to After Visit Summary for other counseling recommendations.   Future Appointments  Date Time Provider C-Road  03/10/2022 10:00 AM [facility] NURSE [facility]  03/17/2022  2:45 PM [facility]  04/02/2022  3:55 PM Clarnce Flock, MD [facility]

## 2022-03-08 LAB — URINE CULTURE

## 2022-03-09 ENCOUNTER — Other Ambulatory Visit: Payer: Self-pay

## 2022-03-09 ENCOUNTER — Inpatient Hospital Stay (HOSPITAL_COMMUNITY): Payer: Medicaid Other

## 2022-03-09 ENCOUNTER — Inpatient Hospital Stay (HOSPITAL_COMMUNITY)
Admission: AD | Admit: 2022-03-09 | Discharge: 2022-03-18 | DRG: 776 | Disposition: A | Discharge status: Home / Self Care | Payer: Medicaid Other | Attending: Obstetrics and Gynecology | Admitting: Obstetrics and Gynecology

## 2022-03-09 ENCOUNTER — Encounter (HOSPITAL_COMMUNITY): Payer: Self-pay | Admitting: Obstetrics and Gynecology

## 2022-03-09 DIAGNOSIS — F4323 Adjustment disorder with mixed anxiety and depressed mood: Secondary | ICD-10-CM | POA: Diagnosis not present

## 2022-03-09 DIAGNOSIS — O99019 Anemia complicating pregnancy, unspecified trimester: Secondary | ICD-10-CM

## 2022-03-09 DIAGNOSIS — O99893 Other specified diseases and conditions complicating puerperium: Secondary | ICD-10-CM | POA: Diagnosis present

## 2022-03-09 DIAGNOSIS — K661 Hemoperitoneum: Secondary | ICD-10-CM | POA: Diagnosis present

## 2022-03-09 DIAGNOSIS — B9620 Unspecified Escherichia coli [E. coli] as the cause of diseases classified elsewhere: Secondary | ICD-10-CM | POA: Diagnosis present

## 2022-03-09 DIAGNOSIS — O8612 Endometritis following delivery: Principal | ICD-10-CM | POA: Diagnosis present

## 2022-03-09 DIAGNOSIS — O9081 Anemia of the puerperium: Secondary | ICD-10-CM | POA: Diagnosis present

## 2022-03-09 DIAGNOSIS — O85 Puerperal sepsis: Secondary | ICD-10-CM | POA: Diagnosis not present

## 2022-03-09 DIAGNOSIS — E876 Hypokalemia: Secondary | ICD-10-CM | POA: Diagnosis present

## 2022-03-09 DIAGNOSIS — Z98891 History of uterine scar from previous surgery: Secondary | ICD-10-CM

## 2022-03-09 DIAGNOSIS — D62 Acute posthemorrhagic anemia: Secondary | ICD-10-CM | POA: Diagnosis present

## 2022-03-09 DIAGNOSIS — O99285 Endocrine, nutritional and metabolic diseases complicating the puerperium: Secondary | ICD-10-CM | POA: Diagnosis present

## 2022-03-09 DIAGNOSIS — A419 Sepsis, unspecified organism: Secondary | ICD-10-CM | POA: Diagnosis present

## 2022-03-09 DIAGNOSIS — D75838 Other thrombocytosis: Secondary | ICD-10-CM | POA: Diagnosis not present

## 2022-03-09 DIAGNOSIS — Z3491 Encounter for supervision of normal pregnancy, unspecified, first trimester: Secondary | ICD-10-CM

## 2022-03-09 DIAGNOSIS — D509 Iron deficiency anemia, unspecified: Secondary | ICD-10-CM

## 2022-03-09 HISTORY — DX: Puerperal sepsis: O85

## 2022-03-09 LAB — CBC WITH DIFFERENTIAL/PLATELET
Abs Immature Granulocytes: 0.31 10*3/uL — ABNORMAL HIGH (ref 0.00–0.07)
Basophils Absolute: 0 10*3/uL (ref 0.0–0.1)
Basophils Relative: 0 %
Eosinophils Absolute: 0 10*3/uL (ref 0.0–0.5)
Eosinophils Relative: 0 %
HCT: 29.8 % — ABNORMAL LOW (ref 36.0–46.0)
Hemoglobin: 10.2 g/dL — ABNORMAL LOW (ref 12.0–15.0)
Immature Granulocytes: 2 %
Lymphocytes Relative: 7 %
Lymphs Abs: 1.5 10*3/uL (ref 0.7–4.0)
MCH: 30.4 pg (ref 26.0–34.0)
MCHC: 34.2 g/dL (ref 30.0–36.0)
MCV: 88.7 fL (ref 80.0–100.0)
Monocytes Absolute: 1.3 10*3/uL — ABNORMAL HIGH (ref 0.1–1.0)
Monocytes Relative: 6 %
Neutro Abs: 18.1 10*3/uL — ABNORMAL HIGH (ref 1.7–7.7)
Neutrophils Relative %: 85 %
Platelets: 687 10*3/uL — ABNORMAL HIGH (ref 150–400)
RBC: 3.36 MIL/uL — ABNORMAL LOW (ref 3.87–5.11)
RDW: 13.6 % (ref 11.5–15.5)
WBC: 21.3 10*3/uL — ABNORMAL HIGH (ref 4.0–10.5)
nRBC: 0 % (ref 0.0–0.2)

## 2022-03-09 LAB — COMPREHENSIVE METABOLIC PANEL
ALT: 9 U/L (ref 0–44)
AST: 13 U/L — ABNORMAL LOW (ref 15–41)
Albumin: 2 g/dL — ABNORMAL LOW (ref 3.5–5.0)
Alkaline Phosphatase: 79 U/L (ref 38–126)
Anion gap: 10 (ref 5–15)
BUN: 6 mg/dL (ref 6–20)
CO2: 23 mmol/L (ref 22–32)
Calcium: 8 mg/dL — ABNORMAL LOW (ref 8.9–10.3)
Chloride: 101 mmol/L (ref 98–111)
Creatinine, Ser: 0.79 mg/dL (ref 0.44–1.00)
GFR, Estimated: >60 mL/min (ref >60)
Glucose, Bld: 97 mg/dL (ref 70–99)
Potassium: 2.7 mmol/L — CL (ref 3.5–5.1)
Sodium: 134 mmol/L — ABNORMAL LOW (ref 135–145)
Total Bilirubin: 0.8 mg/dL (ref 0.3–1.2)
Total Protein: 6.2 g/dL — ABNORMAL LOW (ref 6.5–8.1)

## 2022-03-09 LAB — PROTIME-INR
INR: 1.4 — ABNORMAL HIGH (ref 0.8–1.2)
Prothrombin Time: 17.3 seconds — ABNORMAL HIGH (ref 11.4–15.2)

## 2022-03-09 LAB — MAGNESIUM: Magnesium: 1.7 mg/dL (ref 1.7–2.4)

## 2022-03-09 LAB — TYPE AND SCREEN
ABO/RH(D): O POS
Antibody Screen: NEGATIVE

## 2022-03-09 LAB — APTT: aPTT: 37 s — ABNORMAL HIGH (ref 24–36)

## 2022-03-09 LAB — LACTIC ACID, PLASMA: Lactic Acid, Venous: 1 mmol/L (ref 0.5–1.9)

## 2022-03-09 MED ORDER — LACTATED RINGERS IV BOLUS
1000.0000 mL | Freq: Once | INTRAVENOUS | Status: AC
  Administered 2022-03-09: 1000 mL via INTRAVENOUS

## 2022-03-09 MED ORDER — LACTATED RINGERS IV BOLUS (SEPSIS): 250.0000 mL | Freq: Once | INTRAVENOUS | Status: DC

## 2022-03-09 MED ORDER — ACETAMINOPHEN 500 MG PO TABS
1000.0000 mg | ORAL_TABLET | Freq: Once | ORAL | Status: AC
  Administered 2022-03-09: 1000 mg via ORAL
  Filled 2022-03-09: qty 2

## 2022-03-09 MED ORDER — IBUPROFEN 800 MG PO TABS
800.0000 mg | ORAL_TABLET | Freq: Once | ORAL | Status: AC
  Administered 2022-03-09: 800 mg via ORAL
  Filled 2022-03-09: qty 1

## 2022-03-09 MED ORDER — TETANUS-DIPHTH-ACELL PERTUSSIS 5-2.5-18.5 LF-MCG/0.5 IM SUSY: 0.5000 mL | PREFILLED_SYRINGE | Freq: Once | INTRAMUSCULAR | Status: DC

## 2022-03-09 MED ORDER — OXYCODONE HCL 5 MG PO TABS
5.0000 mg | ORAL_TABLET | ORAL | Status: DC | PRN
  Administered 2022-03-14: 10 mg via ORAL
  Filled 2022-03-09 (×2): qty 2

## 2022-03-09 MED ORDER — ACETAMINOPHEN 325 MG PO TABS
650.0000 mg | ORAL_TABLET | Freq: Four times a day (QID) | ORAL | Status: DC | PRN
  Administered 2022-03-10 – 2022-03-11 (×2): 650 mg via ORAL
  Filled 2022-03-09 (×2): qty 2

## 2022-03-09 MED ORDER — PIPERACILLIN-TAZOBACTAM 3.375 G IVPB
3.3750 g | Freq: Three times a day (TID) | INTRAVENOUS | Status: AC
Start: 2022-03-09 — End: 2022-03-13
  Administered 2022-03-09 – 2022-03-13 (×13): 3.375 g via INTRAVENOUS
  Filled 2022-03-09 (×16): qty 50

## 2022-03-09 MED ORDER — SODIUM CHLORIDE 0.9 % IV SOLN: INTRAVENOUS | Status: AC

## 2022-03-09 MED ORDER — MENTHOL 3 MG MT LOZG: 1.0000 | LOZENGE | OROMUCOSAL | Status: DC | PRN

## 2022-03-09 MED ORDER — ONDANSETRON HCL 4 MG/2ML IJ SOLN
4.0000 mg | Freq: Four times a day (QID) | INTRAMUSCULAR | Status: DC | PRN
  Administered 2022-03-10 – 2022-03-17 (×7): 4 mg via INTRAVENOUS
  Filled 2022-03-09 (×7): qty 2

## 2022-03-09 MED ORDER — COCONUT OIL OIL: 1.0000 "application " | TOPICAL_OIL | Status: DC | PRN

## 2022-03-09 MED ORDER — POTASSIUM CHLORIDE CRYS ER 20 MEQ PO TBCR
30.0000 meq | EXTENDED_RELEASE_TABLET | Freq: Two times a day (BID) | ORAL | Status: DC
  Administered 2022-03-09: 30 meq via ORAL
  Filled 2022-03-09: qty 1

## 2022-03-09 MED ORDER — WITCH HAZEL-GLYCERIN EX PADS: 1.0000 "application " | MEDICATED_PAD | CUTANEOUS | Status: DC | PRN

## 2022-03-09 MED ORDER — DIBUCAINE (PERIANAL) 1 % EX OINT: 1.0000 "application " | TOPICAL_OINTMENT | CUTANEOUS | Status: DC | PRN

## 2022-03-09 MED ORDER — IOHEXOL 9 MG/ML PO SOLN
ORAL | Status: AC
  Administered 2022-03-09: 500 mL
  Filled 2022-03-09: qty 1000

## 2022-03-09 MED ORDER — ENOXAPARIN SODIUM 40 MG/0.4ML IJ SOSY
40.0000 mg | PREFILLED_SYRINGE | INTRAMUSCULAR | Status: DC
  Filled 2022-03-09 (×4): qty 0.4

## 2022-03-09 MED ORDER — DIPHENHYDRAMINE HCL 25 MG PO CAPS: 25.0000 mg | ORAL_CAPSULE | Freq: Four times a day (QID) | ORAL | Status: DC | PRN

## 2022-03-09 MED ORDER — SIMETHICONE 80 MG PO CHEW: 80.0000 mg | CHEWABLE_TABLET | ORAL | Status: DC | PRN

## 2022-03-09 MED ORDER — IOHEXOL 300 MG/ML  SOLN
80.0000 mL | Freq: Once | INTRAMUSCULAR | Status: AC | PRN
  Administered 2022-03-09: 80 mL via INTRAVENOUS

## 2022-03-09 MED ORDER — POLYETHYLENE GLYCOL 3350 17 G PO PACK
17.0000 g | PACK | Freq: Every day | ORAL | Status: DC
  Administered 2022-03-11 – 2022-03-12 (×2): 17 g via ORAL
  Filled 2022-03-09 (×4): qty 1

## 2022-03-09 MED ORDER — LACTATED RINGERS IV BOLUS (SEPSIS)
1000.0000 mL | Freq: Once | INTRAVENOUS | Status: AC
  Administered 2022-03-09: 1000 mL via INTRAVENOUS

## 2022-03-09 MED ORDER — LACTATED RINGERS IV BOLUS (SEPSIS): 1000.0000 mL | Freq: Once | INTRAVENOUS | Status: DC

## 2022-03-09 MED ORDER — IBUPROFEN 600 MG PO TABS
600.0000 mg | ORAL_TABLET | Freq: Four times a day (QID) | ORAL | Status: DC | PRN
Start: 2022-03-09 — End: 2022-03-11
  Administered 2022-03-10: 600 mg via ORAL
  Filled 2022-03-09: qty 1

## 2022-03-09 NOTE — Sepsis Progress Note (Signed)
Elink following code sepsis °

## 2022-03-09 NOTE — Progress Notes (Signed)
Melissa Guzman is a 26 y.o. female admitted on 03/09/2022  3:22 PM  with sepsis. Pharmacy has been consulted for zosyn dosing.  Plan: Zosyn 3.375g IV q8h (4 hour infusion).  Ht Readings from Last 1 Encounters:  03/01/22 5\' 2"  (1.575 m)       Ideal body weight: 50.1 kg (110 lb 7.2 oz) Adjusted ideal body weight: 59.6 kg (131 lb 6.3 oz)   Temp: 98.7 F (37.1 C) (06/11 1642) Temp Source: Oral (06/11 1642) BP: 126/64 (06/11 1642) Pulse Rate: 135 (06/11 1642)   Recent Labs    03/09/22 1628  WBC 21.3*   Estimated Creatinine Clearance: 102.9 mL/min (by C-G formula based on SCr of 0.68 mg/dL).  Allergies: Patient has no known allergies.    Thank you for allowing pharmacy to be a part of this patient's care.  Wyline Mood 03/09/2022 5:27 PM

## 2022-03-09 NOTE — H&P (Signed)
History     CSN: [id number]  Arrival date and time: 03/09/22 1522   Event Date/Time   First Provider Initiated Contact with Patient 03/09/22 1558      Chief Complaint  Patient presents with   Vaginal Bleeding   HPI  Melissa Guzman is a 26 y.o. G1P1001 at postpartum from a primary c/s on 6/3 who presents for evaluation of vaginal bleeding. Patient reports she passed one large clot around 0300. She reports the bleeding has continued but is more like a period now and is no longer passing clots.   She reports generalized abdominal pain. She states the pain is not in her incision but diffuse. She feels like her abdomen is tight and tender to the touch. She reports her tylenol, ibuprofen and oxycodone are not helping. Patient rates the pain as a 10/10.  OB History     Gravida  1   Para  1   Term  1   Preterm  0   AB  0   Living  1      SAB  0   IAB  0   Ectopic  0   Multiple  0   Live Births  1           Past Medical History:  Diagnosis Date   Anemia     Past Surgical History:  Procedure Laterality Date   CESAREAN SECTION N/A 03/01/2022   Procedure: CESAREAN SECTION;  Surgeon: Hermina Staggers, MD;  Location: [facility];  Service: Obstetrics;  Laterality: N/A;    No family history on file.  Social History   Tobacco Use   Smoking status: Never   Smokeless tobacco: Never  Vaping Use   Vaping Use: Never used  Substance Use Topics   Alcohol use: Not Currently   Drug use: No    Allergies: No Known Allergies  Medications Prior to Admission  Medication Sig Dispense Refill Last Dose   acetaminophen (TYLENOL) 500 MG tablet Take 2 tablets (1,000 mg total) by mouth every 8 (eight) hours as needed (pain). 60 tablet 0    amoxicillin-clavulanate (AUGMENTIN) 875-125 MG tablet Take 1 tablet by mouth 2 (two) times daily for 7 days. 14 tablet 0    Elastic Bandages & Supports (COMFORT FIT MATERNITY SUPP MED) MISC 1 Units by Does not apply route daily. (Patient not  taking: Reported on 12/16/2021) 1 each 0    ferrous sulfate (FERROUSUL) 325 (65 FE) MG tablet Take 1 tablet (325 mg total) by mouth every other day. 60 tablet 4    furosemide (LASIX) 20 MG tablet Take 1 tablet (20 mg total) by mouth daily for 1 day. 1 tablet 0    ibuprofen (ADVIL) 600 MG tablet Take 1 tablet (600 mg total) by mouth every 6 (six) hours as needed (pain). 40 tablet 0    NIFEdipine (ADALAT CC) 30 MG 24 hr tablet Take 1 tablet (30 mg total) by mouth daily. 60 tablet 0    oxyCODONE (OXY IR/ROXICODONE) 5 MG immediate release tablet Take 1 tablet (5 mg total) by mouth every 6 (six) hours as needed for severe pain or breakthrough pain. 5 tablet 0    Prenatal Vit-Fe Fumarate-FA (PREPLUS) 27-1 MG TABS Take 1 tablet by mouth daily.       Review of Systems  Constitutional:  Positive for chills, fatigue and fever.  HENT: Negative.    Respiratory: Negative.  Negative for shortness of breath.   Cardiovascular: Negative.  Negative for  chest pain.  Gastrointestinal:  Positive for abdominal pain. Negative for constipation, diarrhea, nausea and vomiting.  Genitourinary:  Positive for vaginal bleeding. Negative for dysuria and vaginal discharge.  Neurological: Negative.  Negative for dizziness and headaches.   Physical Exam   Blood pressure (!) 113/59, pulse (!) 157, temperature (!) 101.6 F (38.7 C), resp. rate 18, SpO2 96 %, currently breastfeeding.  Patient Vitals for the past 24 hrs:  BP Temp Pulse Resp SpO2  03/09/22 1549 (!) 113/59 (!) 101.6 F (38.7 C) (!) 157 18 96 %    Physical Exam Vitals and nursing note reviewed.  Constitutional:      General: She is not in acute distress.    Appearance: She is well-developed. She is ill-appearing and diaphoretic.  HENT:     Head: Normocephalic.  Eyes:     Pupils: Pupils are equal, round, and reactive to light.  Cardiovascular:     Rate and Rhythm: Regular rhythm. Tachycardia present.     Heart sounds: Normal heart sounds.  Pulmonary:      Effort: Pulmonary effort is normal. Tachypnea present. No respiratory distress.     Breath sounds: Normal breath sounds.  Abdominal:     General: There is distension.     Palpations: Abdomen is soft.     Tenderness: There is abdominal tenderness. There is guarding.     Comments: Abdomen diffusely tender in all 4 quadrants  Skin:    General: Skin is warm.  Neurological:     Mental Status: She is alert and oriented to person, place, and time.  Psychiatric:        Mood and Affect: Mood normal.        Behavior: Behavior normal.        Thought Content: Thought content normal.        Judgment: Judgment normal.     MAU Course  Procedures  Results for orders placed or performed during the hospital encounter of 03/09/22 (from the past 24 hour(s))  CBC with Differential/Platelet     Status: Abnormal   Collection Time: 03/09/22  4:28 PM  Result Value Ref Range   WBC 21.3 (H) 4.0 - 10.5 K/uL   RBC 3.36 (L) 3.87 - 5.11 MIL/uL   Hemoglobin 10.2 (L) 12.0 - 15.0 g/dL   HCT 10.9 (L) 32.3 - 55.7 %   MCV 88.7 80.0 - 100.0 fL   MCH 30.4 26.0 - 34.0 pg   MCHC 34.2 30.0 - 36.0 g/dL   RDW 32.2 02.5 - 42.7 %   Platelets 687 (H) 150 - 400 K/uL   nRBC 0.0 0.0 - 0.2 %   Neutrophils Relative % 85 %   Neutro Abs 18.1 (H) 1.7 - 7.7 K/uL   Lymphocytes Relative 7 %   Lymphs Abs 1.5 0.7 - 4.0 K/uL   Monocytes Relative 6 %   Monocytes Absolute 1.3 (H) 0.1 - 1.0 K/uL   Eosinophils Relative 0 %   Eosinophils Absolute 0.0 0.0 - 0.5 K/uL   Basophils Relative 0 %   Basophils Absolute 0.0 0.0 - 0.1 K/uL   Immature Granulocytes 2 %   Abs Immature Granulocytes 0.31 (H) 0.00 - 0.07 K/uL  Lactic acid, plasma     Status: None   Collection Time: 03/09/22  4:28 PM  Result Value Ref Range   Lactic Acid, Venous 1.0 0.5 - 1.9 mmol/L     MDM Upon arrival to MAU, patient was found to be febrile and profoundly tachycardic.   LR bolus  Labs ordered and reviewed.   CBC with Diff CMP Lactic Acid Blood  cultures x2  Honeycomb dressing removed- steri strips in place with some old blood. Otherwise incision is clean, dry and intact.   US Pelvis Complete- discussed with patient CNM has low concern for retained products of conception given complaint and assessment findings. Patient verbalized concern of internal bleeding. Will obtain ultrasound  CT Abdomen Pelvis  With CBC result- code sepsis initiated  CNM consulted with Dr. Vergie LivingPickens regarding presentation and results- MD agrees with current plan of care and [facility]  Assessment and Plan   1. Postpartum septic endometritis     [facility] -Care turned over to MD  Rolm Bookbinderaroline M Latesa Fratto, CNM 03/09/2022, 3:58 PM

## 2022-03-09 NOTE — MAU Note (Signed)
Pt reports to mau with c/o passing a large blood clot around 0300 this morning.  Pt states bleeding has continued but she has not passed anymore clots.  Reports some mild lower abd cramping.  States bleeding is more like a period now and is not saturating pads

## 2022-03-10 ENCOUNTER — Inpatient Hospital Stay (HOSPITAL_COMMUNITY): Payer: Medicaid Other

## 2022-03-10 ENCOUNTER — Other Ambulatory Visit (HOSPITAL_COMMUNITY): Payer: Self-pay | Admitting: Physician Assistant

## 2022-03-10 ENCOUNTER — Encounter (HOSPITAL_COMMUNITY): Payer: Self-pay | Admitting: Obstetrics and Gynecology

## 2022-03-10 ENCOUNTER — Ambulatory Visit: Payer: Medicaid Other

## 2022-03-10 ENCOUNTER — Inpatient Hospital Stay (HOSPITAL_COMMUNITY): Admission: AD | Admit: 2022-03-10 | Payer: Medicaid Other | Source: Home / Self Care | Admitting: Family Medicine

## 2022-03-10 DIAGNOSIS — O85 Puerperal sepsis: Secondary | ICD-10-CM | POA: Diagnosis not present

## 2022-03-10 LAB — URINALYSIS, ROUTINE W REFLEX MICROSCOPIC
Bilirubin Urine: NEGATIVE
Glucose, UA: NEGATIVE mg/dL
Ketones, ur: 5 mg/dL — AB
Nitrite: NEGATIVE
Protein, ur: NEGATIVE mg/dL
Specific Gravity, Urine: 1.024 (ref 1.005–1.030)
WBC, UA: >50 WBC/hpf — ABNORMAL HIGH (ref 0–5)
pH: 6 (ref 5.0–8.0)

## 2022-03-10 LAB — CBC WITH DIFFERENTIAL/PLATELET
Abs Immature Granulocytes: 0.23 10*3/uL — ABNORMAL HIGH (ref 0.00–0.07)
Basophils Absolute: 0 10*3/uL (ref 0.0–0.1)
Basophils Relative: 0 %
Eosinophils Absolute: 0.1 10*3/uL (ref 0.0–0.5)
Eosinophils Relative: 0 %
HCT: 31.5 % — ABNORMAL LOW (ref 36.0–46.0)
Hemoglobin: 10.4 g/dL — ABNORMAL LOW (ref 12.0–15.0)
Immature Granulocytes: 1 %
Lymphocytes Relative: 7 %
Lymphs Abs: 1.5 10*3/uL (ref 0.7–4.0)
MCH: 29.5 pg (ref 26.0–34.0)
MCHC: 33 g/dL (ref 30.0–36.0)
MCV: 89.5 fL (ref 80.0–100.0)
Monocytes Absolute: 1.3 10*3/uL — ABNORMAL HIGH (ref 0.1–1.0)
Monocytes Relative: 6 %
Neutro Abs: 18.3 10*3/uL — ABNORMAL HIGH (ref 1.7–7.7)
Neutrophils Relative %: 86 %
Platelets: 708 10*3/uL — ABNORMAL HIGH (ref 150–400)
RBC: 3.52 MIL/uL — ABNORMAL LOW (ref 3.87–5.11)
RDW: 13.8 % (ref 11.5–15.5)
WBC: 21.5 10*3/uL — ABNORMAL HIGH (ref 4.0–10.5)
nRBC: 0.1 % (ref 0.0–0.2)

## 2022-03-10 LAB — COMPREHENSIVE METABOLIC PANEL
ALT: 8 U/L (ref 0–44)
AST: 14 U/L — ABNORMAL LOW (ref 15–41)
Albumin: 1.7 g/dL — ABNORMAL LOW (ref 3.5–5.0)
Alkaline Phosphatase: 71 U/L (ref 38–126)
Anion gap: 10 (ref 5–15)
BUN: <5 mg/dL — ABNORMAL LOW (ref 6–20)
CO2: 26 mmol/L (ref 22–32)
Calcium: 8 mg/dL — ABNORMAL LOW (ref 8.9–10.3)
Chloride: 103 mmol/L (ref 98–111)
Creatinine, Ser: 0.74 mg/dL (ref 0.44–1.00)
GFR, Estimated: >60 mL/min (ref >60)
Glucose, Bld: 74 mg/dL (ref 70–99)
Potassium: 3 mmol/L — ABNORMAL LOW (ref 3.5–5.1)
Sodium: 139 mmol/L (ref 135–145)
Total Bilirubin: 0.8 mg/dL (ref 0.3–1.2)
Total Protein: 5.7 g/dL — ABNORMAL LOW (ref 6.5–8.1)

## 2022-03-10 MED ORDER — LIDOCAINE HCL 1 % IJ SOLN
INTRAMUSCULAR | Status: AC
  Filled 2022-03-10: qty 10

## 2022-03-10 MED ORDER — SODIUM CHLORIDE 0.9 % IV SOLN
INTRAVENOUS | Status: DC
  Administered 2022-03-12: 75 mL/h via INTRAVENOUS

## 2022-03-10 MED ORDER — POTASSIUM CHLORIDE CRYS ER 20 MEQ PO TBCR
40.0000 meq | EXTENDED_RELEASE_TABLET | Freq: Two times a day (BID) | ORAL | Status: AC
Start: 2022-03-10 — End: 2022-03-11
  Administered 2022-03-10 – 2022-03-11 (×3): 40 meq via ORAL
  Filled 2022-03-10 (×3): qty 2

## 2022-03-10 MED ORDER — ONDANSETRON HCL 4 MG/2ML IJ SOLN
INTRAMUSCULAR | Status: AC | PRN
  Administered 2022-03-10: 4 mg via INTRAVENOUS

## 2022-03-10 MED ORDER — MIDAZOLAM HCL 2 MG/2ML IJ SOLN
INTRAMUSCULAR | Status: AC
  Filled 2022-03-10: qty 4

## 2022-03-10 MED ORDER — FENTANYL CITRATE (PF) 100 MCG/2ML IJ SOLN
INTRAMUSCULAR | Status: AC
  Filled 2022-03-10: qty 4

## 2022-03-10 MED ORDER — FENTANYL CITRATE (PF) 100 MCG/2ML IJ SOLN
INTRAMUSCULAR | Status: AC | PRN
  Administered 2022-03-10: 25 ug via INTRAVENOUS
  Administered 2022-03-10: 50 ug via INTRAVENOUS
  Administered 2022-03-10: 25 ug via INTRAVENOUS
  Administered 2022-03-10: 50 ug via INTRAVENOUS

## 2022-03-10 MED ORDER — ONDANSETRON HCL 4 MG/2ML IJ SOLN
INTRAMUSCULAR | Status: AC
  Filled 2022-03-10: qty 2

## 2022-03-10 MED ORDER — SODIUM CHLORIDE 0.9 % IV SOLN: INTRAVENOUS | Status: DC | PRN

## 2022-03-10 MED ORDER — MIDAZOLAM HCL 2 MG/2ML IJ SOLN
INTRAMUSCULAR | Status: AC | PRN
  Administered 2022-03-10 (×3): 1 mg via INTRAVENOUS

## 2022-03-10 NOTE — Procedures (Signed)
Interventional Radiology Procedure Note  Date of Procedure: 03/10/2022  Procedure: CT drain placement   Findings:  1. Successful CT drain placement, 12 Fr multipurpose into abdominal collection, likely hematoma    Complications: No immediate complications noted.   Estimated Blood Loss: minimal  Follow-up and Recommendations: 1. Follow up cultures  2. If cultures negative, would consider keeping drain in place no more than 48-72 hours to reduce risk of secondary infection    Olive Bass, MD  Vascular & Interventional Radiology  03/10/2022 3:56 PM

## 2022-03-10 NOTE — Consult Note (Addendum)
Chief Complaint: Patient was seen by IR in consultation today for vaginal bleeding with concern for pelvic fluid collection at the request of Dr Verita Schneiders  Chief Complaint  Patient presents with   Vaginal Bleeding     Referring Physician: Verita Schneiders MD  Supervising Physician: Ruthann Cancer  Patient Status: [facility] - In-pt  History of Present Illness: Melissa Guzman is a 26 y.o. female with vaginal bleeding s/p Caesarian section on 6/3.  Pt treated for sepsis starting on 6/5, with tachycardia and abdominal tenderness reported, IV Zosyn started on 6/5; pt additionally showed Hgb of 4.4 on 6/5, and received 4 units of pRBCs. Pt was discharged on 6/7 and presented to MAU on 6/11 with vaginal bleeding and abdominal pain; pt was readmitted to hospital at this time. Pt reports passing one large clot around 0300 on 6/10, with continued bleeding since that time that she states is more like her period.   CT revealed large pelvic hematoma/hemoperitoneum with active bleeding not excluded. Pt has remained on Zosyn and analgesia, and has been placed NPO by her care team. IR was consulted on 6/12 for possible aspiration and/or placement of percutaneous drain. On exam today, patient was sitting upright in bed with concerns about her abdominal pain. Pt reports she still has vaginal bleeding but that it is improving. Patient's family member and baby were at bedside at time of exam.   Past Medical History:  Diagnosis Date   Anemia     Past Surgical History:  Procedure Laterality Date   CESAREAN SECTION N/A 03/01/2022   Procedure: CESAREAN SECTION;  Surgeon: Chancy Milroy, MD;  Location: [facility] LD ORS;  Service: Obstetrics;  Laterality: N/A;    Allergies: Patient has no known allergies.  Medications: Prior to Admission medications   Medication Sig Start Date End Date Taking? Authorizing Provider  acetaminophen (TYLENOL) 500 MG tablet Take 2 tablets (1,000 mg total) by mouth every 8 (eight)  hours as needed (pain). 03/05/22   Genia Del, MD  amoxicillin-clavulanate (AUGMENTIN) 875-125 MG tablet Take 1 tablet by mouth 2 (two) times daily for 7 days. 03/06/22 03/13/22  Caren Macadam, MD  Elastic Bandages & Supports (COMFORT FIT MATERNITY SUPP MED) MISC 1 Units by Does not apply route daily. Patient not taking: Reported on 12/16/2021 10/09/21   Caren Macadam, MD  ferrous sulfate (FERROUSUL) 325 (65 FE) MG tablet Take 1 tablet (325 mg total) by mouth every other day. 09/26/21   Caren Macadam, MD  furosemide (LASIX) 20 MG tablet Take 1 tablet (20 mg total) by mouth daily for 1 day. 03/05/22 03/06/22  Genia Del, MD  ibuprofen (ADVIL) 600 MG tablet Take 1 tablet (600 mg total) by mouth every 6 (six) hours as needed (pain). 03/05/22   Genia Del, MD  NIFEdipine (ADALAT CC) 30 MG 24 hr tablet Take 1 tablet (30 mg total) by mouth daily. 03/05/22   Genia Del, MD  oxyCODONE (OXY IR/ROXICODONE) 5 MG immediate release tablet Take 1 tablet (5 mg total) by mouth every 6 (six) hours as needed for severe pain or breakthrough pain. 03/06/22   Caren Macadam, MD  Prenatal Vit-Fe Fumarate-FA (PREPLUS) 27-1 MG TABS Take 1 tablet by mouth daily.    [provider]     History reviewed. No pertinent family history.  Social History   Socioeconomic History   Marital status: Single    Spouse name: Not on file   Number of children: Not on  file   Years of education: Not on file   Highest education level: Not on file  Occupational History   Not on file  Tobacco Use   Smoking status: Never   Smokeless tobacco: Never  Vaping Use   Vaping Use: Never used  Substance and Sexual Activity   Alcohol use: Not Currently   Drug use: No   Sexual activity: Yes    Birth control/protection: None  Other Topics Concern   Not on file  Social History Narrative   Not on file   Social Determinants of Health   Financial Resource Strain: Not on file   Food Insecurity: No Food Insecurity (02/26/2022)   Hunger Vital Sign    Worried About Running Out of Food in the Last Year: Never true    Ran Out of Food in the Last Year: Never true  Transportation Needs: No Transportation Needs (12/30/2021)   PRAPARE - Hydrologist (Medical): No    Lack of Transportation (Non-Medical): No  Physical Activity: Not on file  Stress: Not on file  Social Connections: Not on file      Review of Systems: A 12 point ROS discussed and pertinent positives are indicated in the HPI above.  All other systems are negative.  Review of Systems  Constitutional:  Negative for chills, diaphoresis and fever.  Cardiovascular:  Negative for chest pain and palpitations.  Gastrointestinal:  Positive for abdominal pain. Negative for diarrhea, nausea and vomiting.  Genitourinary:  Positive for vaginal bleeding.  Neurological:  Negative for dizziness and headaches.  Psychiatric/Behavioral:  Negative for confusion.     Vital Signs: BP 116/63 (BP Location: Left Arm)   Pulse (!) 148   Temp 98.6 F (37 C) (Oral)   Resp 16   SpO2 98%   Breastfeeding Yes   Physical Exam Constitutional:      General: She is not in acute distress. HENT:     Head: Normocephalic.     Mouth/Throat:     Mouth: Mucous membranes are moist.  Cardiovascular:     Rate and Rhythm: Regular rhythm. Tachycardia present.     Heart sounds: Normal heart sounds.  Pulmonary:     Effort: Pulmonary effort is normal.     Breath sounds: Normal breath sounds.  Abdominal:     General: Bowel sounds are normal.     Tenderness: There is abdominal tenderness.  Skin:    General: Skin is warm and dry.  Neurological:     Mental Status: She is alert and oriented to person, place, and time.  Psychiatric:        Mood and Affect: Mood normal.        Behavior: Behavior normal.     Imaging: CT ABDOMEN PELVIS W CONTRAST  Result Date: 03/10/2022 CLINICAL DATA:  Abdominal pain,  postop sepsis, postop following C-section with diffuse abdominal pain. EXAM: CT ABDOMEN AND PELVIS WITH CONTRAST TECHNIQUE: Multidetector CT imaging of the abdomen and pelvis was performed using the standard protocol following bolus administration of intravenous contrast. RADIATION DOSE REDUCTION: This exam was performed according to the departmental dose-optimization program which includes automated exposure control, adjustment of the mA and/or kV according to patient size and/or use of iterative reconstruction technique. CONTRAST:  62mL OMNIPAQUE IOHEXOL 300 MG/ML  SOLN COMPARISON:  Postoperative ultrasound earlier today at 17:44. FINDINGS: Lower chest: There are bilateral trace pleural effusions. Lung bases are otherwise clear. The cardiac size is normal. Hepatobiliary: 21.5 cm in length mildly steatotic  liver. No mass enhancement or laceration. The gallbladder bile ducts are unremarkable. Pancreas: Unremarkable. Spleen: No mass enhancement or splenomegaly. Adrenals/Urinary Tract: There is no adrenal mass. Portions of the left nephrogram show striations concerning for pyelonephritis. Embolic disease could appear similar but there is normal opacification in the renal arteries and veins on both sides. There are few small low-density lesions in both kidneys which are too small to characterize. No urinary stone is seen. The ureters are free of filling defects and the bladder thickness is normal. Stomach/Bowel: The stomach and small bowel show no dilatation or wall thickening. The appendix is not seen. There are thickened folds in the distal ascending colon versus nondistention. Remainder of the large intestine wall is unremarkable. Vascular/Lymphatic: The hepatic portal vein and aorta opacify normally. There is pelvic venous congestion. No adenopathy is seen. Reproductive: Enlarged uterus consistent with recent postpartum state. There is heterogeneous and possibly enhancing material in the uterine cavity. The  C-section incision is difficult to see but it is probably in the ventral lower uterine segment with uterus anteverted. The ovaries are not well seen due to overlapping structures but no obvious ovarian mass is evident. Other: There is a large heterogeneous fluid collection centered in the pelvis ventral to the uterus which is consistent with a hematoma/hemoperitoneum. Active bleeding is not excluded. There are few tiny air pockets in some of the fluid which could be due to the recent open procedure or an infectious complication. The collection is roughly U shaped and extends from the area of the distal paracolic gutters inferiorly anterior to the uterus above the bladder, and is in contact with both adnexal areas. Maximum measurements are up to 20 cm transverse, as large as 9 cm AP and at most 15 cm craniocaudal on both sides with additional mild fluid or blood in the pelvic cul-de-sac small amount of lower density fluid in the left-greater-than-right upper abdomen. There are mesenteric edematous features throughout the abdomen and pelvis which could be congestive versus edema related to peritonitis, or edema related to the recent surgery. Outside of the air within the collection no other free air is seen. There is no incarcerated hernia. There is a small umbilical fat hernia. Musculoskeletal: There are bilateral L5 pars defects with a chronic appearance without spondylolisthesis. No other significant osseous findings. IMPRESSION: 1. Large pelvic hematoma/hemoperitoneum. Detailed description above. Active bleeding not excluded. 2. Scattered air pockets in the collection could represent introduced air from the surgery or infectious complication. 3. Heterogeneous and possibly enhancing intracavitary material in the uterus. Retained products of conception not excluded. 4. Lesser lower-density fluid in the upper abdomen without localization. No other free air. 5. Generalized mesenteric edema which could relate to  surgery, peritonitis or fluid overload. There is also pelvic venous congestion with the deep pelvic veins without visible filling defects. 6. Colitis versus nondistention of the distal ascending colon. 7. Striation of portions of the left nephrogram concerning for pyelonephritis. Embolic disease could appear similar but the renal arteries and veins both opacify well. Correlate with urinalysis findings. No bladder thickening. 8. 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 No spondylolisthesis. 9. Discussed over the phone with Dr. Nelda Marseille at 12:01 a.m., 03/10/2022. Electronically Signed   By: Telford Nab M.D.   On: 03/10/2022 00:07   US Pelvis Complete  Result Date: 03/09/2022 CLINICAL DATA:  Postpartum vaginal bleeding. EXAM: TRANSABDOMINAL ULTRASOUND OF PELVIS TECHNIQUE: Transabdominal ultrasound examination of the pelvis was performed including evaluation of the uterus, ovaries, adnexal regions, and pelvic  cul-de-sac. COMPARISON:  None Available. FINDINGS: Uterus Measurements: 15.2 cm x 6.2 cm x 9.0 cm = volume: 446.3 mL. No fibroids or other mass visualized. Endometrium Thickness: 24.0 mm. A small amount of fluid is seen within the endometrial canal. Right ovary Measurements: 3.0 cm x 2.6 cm x 2.5 cm = volume: 9.9 mL. A large, complex, multi-septated fluid collection is seen extending from the anterior aspect of the right adnexa to the left adnexa. This measures approximately 16 cm, as per the ultrasound technologist (no ultrasonographic measurements provided). Left ovary Measurements: 3.4 cm x 2.2 cm x 3.3 cm = volume: 126.2 mL. Normal appearance/no adnexal mass. Other findings:  No abnormal free fluid. IMPRESSION: 1. Enlarged, postpartum uterus with a large anterior, complex fluid collection, as described above. Further evaluation with abdomen and pelvis CT is recommended. Electronically Signed   By: Virgina Norfolk M.D.   On: 03/09/2022 17:44    Labs:  CBC: Recent Labs    03/04/22 1444  03/05/22 0511 03/09/22 1628 03/10/22 0440  WBC 21.8* 18.6* 21.3* 21.5*  HGB 9.0* 8.5* 10.2* 10.4*  HCT 25.3* 25.2* 29.8* 31.5*  PLT 201 251 687* 708*    COAGS: Recent Labs    03/09/22 1741  INR 1.4*  APTT 37*    BMP: Recent Labs    03/04/22 1444 03/05/22 0511 03/09/22 1628 03/10/22 0440  NA 139 137 134* 139  K 3.0* 3.2* 2.7* 3.0*  CL 108 106 101 103  CO2 23 22 23 26   GLUCOSE 88 83 97 74  BUN 7 5* 6 <5*  CALCIUM 7.9* 7.9* 8.0* 8.0*  CREATININE 0.67 0.68 0.79 0.74  GFRNONAA >60 >60 >60 >60    LIVER FUNCTION TESTS: Recent Labs    03/04/22 1444 03/05/22 0511 03/09/22 1628 03/10/22 0440  BILITOT 0.4 0.6 0.8 0.8  AST 16 18 13* 14*  ALT 10 12 9 8   ALKPHOS 79 84 79 71  PROT 4.7* 4.9* 6.2* 5.7*  ALBUMIN 1.9* 2.0* 2.0* 1.7*    TUMOR MARKERS: No results for input(s): "AFPTM", "CEA", "CA199", "CHROMGRNA" in the last 8760 hours.  Assessment and Plan: Patient is a 26 yo female s/p caesarian section 6/3 and currently being treated for post-partum septic endometritis seen today by IR for consultation for image-guided aspiration and/or drain placement due to a large fluid collection in the patient's pelvis; case was reviewed by Dr Serafina Royals, who approved the procedure.  Patient to be seen by IR possibly today or 6/13 for procedure noted above.  Risks and benefits discussed with the patient including bleeding, infection, damage to adjacent structures, bowel perforation/fistula connection, and sepsis.  All of the patient's questions were answered, patient is agreeable to proceed. Consent signed and in chart.   Patient's allergy list was reviewed with no known allergies reported. Patient's labs reviewed prior to exam.   Thank you for this interesting consult.  I greatly enjoyed meeting Melissa Guzman and look forward to participating in their care.  A copy of this report was sent to the requesting provider on this date.  Electronically Signed: Lura Em, PA 03/10/2022,  10:27 AM   I spent a total of 20 Minutes    in face to face in clinical consultation, greater than 50% of which was counseling/coordinating care for image guided aspiration and/or drain placement.

## 2022-03-10 NOTE — Progress Notes (Signed)
Faculty Practice OB/GYN Attending Note  Subjective:  Patient reported continued abdominal pain and pressure, not helped much with medications.  She is frustrated.      Objective:  Blood pressure 116/63, pulse (!) 148, temperature 98.6 F (37 C), temperature source Oral, resp. rate 16, SpO2 98 %, currently breastfeeding. Gen: NAD HENT: Normocephalic, atraumatic Lungs: Normal respiratory effort Heart: Regular rate noted Abdomen: soft, moderate TTP, incision C/D/I Cervix: Deferred Ext: 2+ DTRs, no edema, no cyanosis, negative Homan's sign  Studies:    Latest Ref Rng & Units 03/10/2022    4:40 AM 03/09/2022    4:28 PM 03/05/2022    5:11 AM  CBC  WBC 4.0 - 10.5 K/uL 21.5  21.3  18.6   Hemoglobin 12.0 - 15.0 g/dL 56.9  79.4  8.5   Hematocrit 36.0 - 46.0 % 31.5  29.8  25.2   Platelets 150 - 400 K/uL 708  687  251    CT ABDOMEN PELVIS W CONTRAST  Result Date: 03/10/2022 CLINICAL DATA:  Abdominal pain, postop sepsis, postop following C-section with diffuse abdominal pain. EXAM: CT ABDOMEN AND PELVIS WITH CONTRAST TECHNIQUE: Multidetector CT imaging of the abdomen and pelvis was performed using the standard protocol following bolus administration of intravenous contrast. RADIATION DOSE REDUCTION: This exam was performed according to the departmental dose-optimization program which includes automated exposure control, adjustment of the mA and/or kV according to patient size and/or use of iterative reconstruction technique. CONTRAST:  50mL OMNIPAQUE IOHEXOL 300 MG/ML  SOLN COMPARISON:  Postoperative ultrasound earlier today at 17:44. FINDINGS: Lower chest: There are bilateral trace pleural effusions. Lung bases are otherwise clear. The cardiac size is normal. Hepatobiliary: 21.5 cm in length mildly steatotic liver. No mass enhancement or laceration. The gallbladder bile ducts are unremarkable. Pancreas: Unremarkable. Spleen: No mass enhancement or splenomegaly. Adrenals/Urinary Tract: There is no  adrenal mass. Portions of the left nephrogram show striations concerning for pyelonephritis. Embolic disease could appear similar but there is normal opacification in the renal arteries and veins on both sides. There are few small low-density lesions in both kidneys which are too small to characterize. No urinary stone is seen. The ureters are free of filling defects and the bladder thickness is normal. Stomach/Bowel: The stomach and small bowel show no dilatation or wall thickening. The appendix is not seen. There are thickened folds in the distal ascending colon versus nondistention. Remainder of the large intestine wall is unremarkable. Vascular/Lymphatic: The hepatic portal vein and aorta opacify normally. There is pelvic venous congestion. No adenopathy is seen. Reproductive: Enlarged uterus consistent with recent postpartum state. There is heterogeneous and possibly enhancing material in the uterine cavity. The C-section incision is difficult to see but it is probably in the ventral lower uterine segment with uterus anteverted. The ovaries are not well seen due to overlapping structures but no obvious ovarian mass is evident. Other: There is a large heterogeneous fluid collection centered in the pelvis ventral to the uterus which is consistent with a hematoma/hemoperitoneum. Active bleeding is not excluded. There are few tiny air pockets in some of the fluid which could be due to the recent open procedure or an infectious complication. The collection is roughly U shaped and extends from the area of the distal paracolic gutters inferiorly anterior to the uterus above the bladder, and is in contact with both adnexal areas. Maximum measurements are up to 20 cm transverse, as large as 9 cm AP and at most 15 cm craniocaudal on both sides  with additional mild fluid or blood in the pelvic cul-de-sac small amount of lower density fluid in the left-greater-than-right upper abdomen. There are mesenteric edematous  features throughout the abdomen and pelvis which could be congestive versus edema related to peritonitis, or edema related to the recent surgery. Outside of the air within the collection no other free air is seen. There is no incarcerated hernia. There is a small umbilical fat hernia. Musculoskeletal: There are bilateral L5 pars defects with a chronic appearance without spondylolisthesis. No other significant osseous findings. IMPRESSION: 1. Large pelvic hematoma/hemoperitoneum. Detailed description above. Active bleeding not excluded. 2. Scattered air pockets in the collection could represent introduced air from the surgery or infectious complication. 3. Heterogeneous and possibly enhancing intracavitary material in the uterus. Retained products of conception not excluded. 4. Lesser lower-density fluid in the upper abdomen without localization. No other free air. 5. Generalized mesenteric edema which could relate to surgery, peritonitis or fluid overload. There is also pelvic venous congestion with the deep pelvic veins without visible filling defects. 6. Colitis versus nondistention of the distal ascending colon. 7. Striation of portions of the left nephrogram concerning for pyelonephritis. Embolic disease could appear similar but the renal arteries and veins both opacify well. Correlate with urinalysis findings. No bladder thickening. 8. 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 No spondylolisthesis. 9. Discussed over the phone with Dr. Charlotta Newton at 12:01 a.m., 03/10/2022. Electronically Signed   By: Almira Bar M.D.   On: 03/10/2022 00:07   US Pelvis Complete  Result Date: 03/09/2022 CLINICAL DATA:  Postpartum vaginal bleeding. EXAM: TRANSABDOMINAL ULTRASOUND OF PELVIS TECHNIQUE: Transabdominal ultrasound examination of the pelvis was performed including evaluation of the uterus, ovaries, adnexal regions, and pelvic cul-de-sac. COMPARISON:  None Available. FINDINGS: Uterus Measurements: 15.2 cm x 6.2  cm x 9.0 cm = volume: 446.3 mL. No fibroids or other mass visualized. Endometrium Thickness: 24.0 mm. A small amount of fluid is seen within the endometrial canal. Right ovary Measurements: 3.0 cm x 2.6 cm x 2.5 cm = volume: 9.9 mL. A large, complex, multi-septated fluid collection is seen extending from the anterior aspect of the right adnexa to the left adnexa. This measures approximately 16 cm, as per the ultrasound technologist (no ultrasonographic measurements provided). Left ovary Measurements: 3.4 cm x 2.2 cm x 3.3 cm = volume: 126.2 mL. Normal appearance/no adnexal mass. Other findings:  No abnormal free fluid. IMPRESSION: 1. Enlarged, postpartum uterus with a large anterior, complex fluid collection, as described above. Further evaluation with abdomen and pelvis CT is recommended. Electronically Signed   By: Aram Candela M.D.   On: 03/09/2022 17:44     Assessment & Plan:  26 y.o. G1P1001 admitted for postpartum endometritis. Also has significant postoperative hemoperitoneum.  No signs of active bleeding given stable hemoglobin of 10.4.  IR consulted for possible drainage, talked to Dr. Elby Showers who will evaluate. Patient to remain NPO for now. Will continue Zosyn, analgesia.  Continue close observation.   Jaynie Collins, MD, FACOG Obstetrician & Gynecologist, [facility] for [employer], [facility]

## 2022-03-11 ENCOUNTER — Encounter (HOSPITAL_COMMUNITY): Payer: Self-pay | Admitting: Obstetrics and Gynecology

## 2022-03-11 DIAGNOSIS — O85 Puerperal sepsis: Secondary | ICD-10-CM | POA: Diagnosis not present

## 2022-03-11 LAB — CBC WITH DIFFERENTIAL/PLATELET
Abs Immature Granulocytes: 0.27 10*3/uL — ABNORMAL HIGH (ref 0.00–0.07)
Basophils Absolute: 0 10*3/uL (ref 0.0–0.1)
Basophils Relative: 0 %
Eosinophils Absolute: 0.1 10*3/uL (ref 0.0–0.5)
Eosinophils Relative: 0 %
HCT: 30 % — ABNORMAL LOW (ref 36.0–46.0)
Hemoglobin: 10 g/dL — ABNORMAL LOW (ref 12.0–15.0)
Immature Granulocytes: 1 %
Lymphocytes Relative: 10 %
Lymphs Abs: 2.1 10*3/uL (ref 0.7–4.0)
MCH: 30 pg (ref 26.0–34.0)
MCHC: 33.3 g/dL (ref 30.0–36.0)
MCV: 90.1 fL (ref 80.0–100.0)
Monocytes Absolute: 1.2 10*3/uL — ABNORMAL HIGH (ref 0.1–1.0)
Monocytes Relative: 6 %
Neutro Abs: 17 10*3/uL — ABNORMAL HIGH (ref 1.7–7.7)
Neutrophils Relative %: 83 %
Platelets: 735 10*3/uL — ABNORMAL HIGH (ref 150–400)
RBC: 3.33 MIL/uL — ABNORMAL LOW (ref 3.87–5.11)
RDW: 14.1 % (ref 11.5–15.5)
WBC: 20.7 10*3/uL — ABNORMAL HIGH (ref 4.0–10.5)
nRBC: 0 % (ref 0.0–0.2)

## 2022-03-11 LAB — CULTURE, OB URINE: Culture: NO GROWTH

## 2022-03-11 LAB — COMPREHENSIVE METABOLIC PANEL
ALT: 9 U/L (ref 0–44)
AST: 16 U/L (ref 15–41)
Albumin: 1.6 g/dL — ABNORMAL LOW (ref 3.5–5.0)
Alkaline Phosphatase: 62 U/L (ref 38–126)
Anion gap: 10 (ref 5–15)
BUN: 5 mg/dL — ABNORMAL LOW (ref 6–20)
CO2: 22 mmol/L (ref 22–32)
Calcium: 7.8 mg/dL — ABNORMAL LOW (ref 8.9–10.3)
Chloride: 104 mmol/L (ref 98–111)
Creatinine, Ser: 0.72 mg/dL (ref 0.44–1.00)
GFR, Estimated: >60 mL/min (ref >60)
Glucose, Bld: 80 mg/dL (ref 70–99)
Potassium: 3.2 mmol/L — ABNORMAL LOW (ref 3.5–5.1)
Sodium: 136 mmol/L (ref 135–145)
Total Bilirubin: 0.7 mg/dL (ref 0.3–1.2)
Total Protein: 5.4 g/dL — ABNORMAL LOW (ref 6.5–8.1)

## 2022-03-11 MED ORDER — IBUPROFEN 800 MG PO TABS
800.0000 mg | ORAL_TABLET | Freq: Three times a day (TID) | ORAL | Status: DC
  Administered 2022-03-11 – 2022-03-12 (×4): 800 mg via ORAL
  Filled 2022-03-11 (×4): qty 1

## 2022-03-11 MED ORDER — ACETAMINOPHEN 500 MG PO TABS
1000.0000 mg | ORAL_TABLET | Freq: Four times a day (QID) | ORAL | Status: DC | PRN
  Administered 2022-03-13 – 2022-03-18 (×8): 1000 mg via ORAL
  Filled 2022-03-11 (×9): qty 2

## 2022-03-11 MED ORDER — VANCOMYCIN HCL 1250 MG/250ML IV SOLN
1250.0000 mg | Freq: Two times a day (BID) | INTRAVENOUS | Status: DC
  Administered 2022-03-11 – 2022-03-13 (×5): 1250 mg via INTRAVENOUS
  Filled 2022-03-11 (×6): qty 250

## 2022-03-11 NOTE — Progress Notes (Signed)
Faculty Practice OB/GYN Attending Note  Subjective:  Patient reported some abdominal pain and pressure, helped by Ibuprofen.  Tolerated drainage procedure well yesterday. Concerned about continued fevers.  No other complaints .    Objective:   Patient Vitals for the past 24 hrs:  BP Temp Temp src Pulse Resp SpO2  03/11/22 1024 -- 99.9 F (37.7 C) Oral -- -- --  03/11/22 0838 -- (!) 102.2 F (39 C) -- -- 20 --  03/11/22 0739 119/65 (!) 102.8 F (39.3 C) Oral (!) 133 (!) 24 98 %  03/11/22 0610 -- 99.4 F (37.4 C) Oral -- -- --  03/11/22 0327 124/78 99.2 F (37.3 C) Oral (!) 111 19 98 %  03/10/22 2313 117/67 98.6 F (37 C) Oral 96 20 96 %  03/10/22 1954 118/62 -- -- (!) 115 (!) 22 96 %  03/10/22 1934 -- 99 F (37.2 C) Oral -- -- --  03/10/22 1826 -- (!) 101.4 F (38.6 C) Axillary -- -- --  03/10/22 1820 126/70 -- Oral (!) 129 (!) 36 96 %  03/10/22 1814 -- (!) 102.2 F (39 C) -- -- -- --  03/10/22 1733 115/63 (!) 103 F (39.4 C) Oral (!) 139 (!) 40 98 %  03/10/22 1649 -- -- -- -- -- 99 %  03/10/22 1621 125/62 (!) 102.6 F (39.2 C) Oral (!) 131 (!) 36 93 %  03/10/22 1555 111/67 -- -- (!) 132 (!) 38 97 %  03/10/22 1550 122/70 -- -- (!) 131 (!) 38 97 %  03/10/22 1545 119/69 -- -- (!) 132 -- 96 %  03/10/22 1540 118/70 -- -- (!) 135 (!) 32 96 %  03/10/22 1535 124/71 -- -- (!) 130 (!) 30 97 %  03/10/22 1517 119/63 -- -- (!) 130 (!) 30 97 %  03/10/22 1139 (!) 107/51 98.2 F (36.8 C) Oral (!) 142 16 98 %    I/O last 3 completed shifts: In: 3376.3 [P.O.:780; I.V.:2315.6; IV Piggyback:280.6] Out: 4100 [Urine:3250; Drains:850]  Gen: NAD HENT: Normocephalic, atraumatic Lungs: Normal respiratory effort Heart: Regular rate noted Abdomen: soft, moderate TTP, incision C/D/I,  RLQ IR drain in place with some sanguinous fluid in bag.  Cervix: Deferred Ext: 2+ DTRs, no edema, no cyanosis, negative Homan's sign  Studies:    Latest Ref Rng & Units 03/11/2022    3:46 AM  03/10/2022    4:40 AM 03/09/2022    4:28 PM  CBC  WBC 4.0 - 10.5 K/uL 20.7  21.5  21.3   Hemoglobin 12.0 - 15.0 g/dL 54.6  56.8  12.7   Hematocrit 36.0 - 46.0 % 30.0  31.5  29.8   Platelets 150 - 400 K/uL 735  708  687       Latest Ref Rng & Units 03/11/2022    3:46 AM 03/10/2022    4:40 AM 03/09/2022    4:28 PM  CMP  Glucose 70 - 99 mg/dL 80  74  97   BUN 6 - 20 mg/dL 5  <5  6   Creatinine 5.17 - 1.00 mg/dL 0.01  7.49  4.49   Sodium 135 - 145 mmol/L 136  139  134   Potassium 3.5 - 5.1 mmol/L 3.2  3.0  2.7   Chloride 98 - 111 mmol/L 104  103  101   CO2 22 - 32 mmol/L 22  26  23    Calcium 8.9 - 10.3 mg/dL 7.8  8.0  8.0   Total Protein 6.5 - 8.1  g/dL 5.4  5.7  6.2   Total Bilirubin 0.3 - 1.2 mg/dL 0.7  0.8  0.8   Alkaline Phos 38 - 126 U/L 62  71  79   AST 15 - 41 U/L 16  14  13    ALT 0 - 44 U/L 9  8  9      CT IMAGE GUIDED DRAINAGE BY PERCUTANEOUS CATHETER  Result Date: 03/11/2022 INDICATION: Postoperative collection EXAM: CT-guided drain placement into the abdominal fluid collection TECHNIQUE: Multidetector CT imaging of the abdomen and pelvis was performed following the standard protocol without IV contrast. RADIATION DOSE REDUCTION: This exam was performed according to the departmental dose-optimization program which includes automated exposure control, adjustment of the mA and/or kV according to patient size and/or use of iterative reconstruction technique. MEDICATIONS: The patient is currently admitted to the hospital and receiving intravenous antibiotics. The antibiotics were administered within an appropriate time frame prior to the initiation of the procedure. ANESTHESIA/SEDATION: Moderate (conscious) sedation was employed during this procedure. A total of Versed 3 mg and Fentanyl 150 mcg was administered intravenously by the radiology nurse. Total intra-service moderate Sedation Time: 19 minutes. The patient's level of consciousness and vital signs were monitored continuously by  radiology nursing throughout the procedure under my direct supervision. COMPLICATIONS: None immediate. PROCEDURE: Informed written consent was obtained from the patient after a thorough discussion of the procedural risks, benefits and alternatives. All questions were addressed. Maximal Sterile Barrier Technique was utilized including caps, mask, sterile gowns, sterile gloves, sterile drape, hand hygiene and skin antiseptic. A timeout was performed prior to the initiation of the procedure. The patient was placed supine on the exam table. Limited CT of the abdomen and pelvis was performed for planning purposes. This again demonstrated a large intra-abdominal/pelvic anterior fluid collection. Skin entry site was marked, and the overlying skin was prepped and draped in a standard sterile fashion. Local analgesia was obtained with 1% lidocaine. Using intermittent CT fluoroscopy, a 19 gauge Yueh needle was advanced into the fluid collection using a right lateral approach. Location was confirmed with CT, and spontaneous return of thin hemorrhagic fluid. Given size and clinical concern for infection, the decision was made to proceed with drainage catheter placement. An 035 wire was easily advanced into the collection through the [facility] catheter. Subsequently, percutaneous tract was serially dilated to accommodate a 12 03/13/2022 multipurpose locking drainage catheter. Location was confirmed with CT and additional return of hemorrhagic fluid. A sample was sent to the lab for microbiology analysis. Locking loop was formed, and the catheter was secured to skin using silk suture and a dressing. It was attached to bag drainage. The patient tolerated the procedure well without immediate complication. IMPRESSION: Successful CT-guided placement of a 12 French locking drainage catheter into the abdominopelvic collection. Findings compatible with postoperative liquified hematoma. A sample was sent for microbiology analysis. If the  collection is determined to be sterile, consider drainage catheter removal after 48-72 hours to minimize risk of secondary infection. Electronically Signed   By: [facility] M.D.   On: 03/11/2022 10:21     Assessment & Plan:  26 y.o. G1P1001 admitted for postoperative hemoperitoneum and endometritis. She is s/p IR drain placement.  Has put out over 850 ml of sanguinous drainage.  No signs of active bleeding given stable hemoglobin of 10.0.  Reassured about output. Given fever curve, antibiotics broadened by addition of Vancomycin, will continue to monitor. Lovenox for VTE ppx, also encouraged OOB Analgesia as needed. Continue close observation.  Jaynie Collins, MD, FACOG Obstetrician & Gynecologist, [facility] for [employer], [facility]

## 2022-03-11 NOTE — Progress Notes (Signed)
Pharmacy Antibiotic Note  Manette Doto is a 26 y.o. female admitted on 03/09/2022 with sepsis.  Pharmacy has been consulted for Vancomycin dosing. Plan: Vancomycin 1250 mg IV Q 12 hrs. Goal AUC 400-550. Expected AUC: 494 SCr used: 0.8  - Will monitor renal function - Consider vancomycin levels if treatment continues > 3 days or if clinically indicated.     Temp (24hrs), Avg:100.8 F (38.2 C), Min:98.2 F (36.8 C), Max:103 F (39.4 C)  Recent Labs  Lab 03/04/22 1444 03/05/22 0511 03/09/22 1628 03/10/22 0440 03/11/22 0346  WBC 21.8* 18.6* 21.3* 21.5* 20.7*  CREATININE 0.67 0.68 0.79 0.74 0.72  LATICACIDVEN  --   --  1.0  --   --     Estimated Creatinine Clearance: 102.9 mL/min (by C-G formula based on SCr of 0.72 mg/dL).    No Known Allergies  Antimicrobials this admission: Zosyn 6/11 >>  Vancomycin 6/13 >>  Microbiology results: 6/11 BCx x2: NGTD 6/12 UCx: negative  6/12: Gram stain - abscess: no organisms  Thank you for allowing pharmacy to be a part of this patient's care.  Natasha Bence 03/11/2022 9:26 AM

## 2022-03-11 NOTE — Progress Notes (Signed)
Referring Physician(s): Dr. Macon Large   Supervising Physician: Gilmer Mor  Patient Status:  [facility] - In-pt  Chief Complaint: Cesarean section with post-op pelvic fluid collection s/p drain placement in IR 03/10/22 by Dr. Juliette Alcide.   Subjective: Patient sitting up in bed eating. A female visitor is at the bedside feeding the baby. She denies any significant pain or discomfort.   Allergies: Patient has no known allergies.  Medications: Prior to Admission medications   Medication Sig Start Date End Date Taking? Authorizing Provider  acetaminophen (TYLENOL) 500 MG tablet Take 2 tablets (1,000 mg total) by mouth every 8 (eight) hours as needed (pain). 03/05/22   Worthy Rancher, MD  amoxicillin-clavulanate (AUGMENTIN) 875-125 MG tablet Take 1 tablet by mouth 2 (two) times daily for 7 days. 03/06/22 03/13/22  Federico Flake, MD  Elastic Bandages & Supports (COMFORT FIT MATERNITY SUPP MED) MISC 1 Units by Does not apply route daily. Patient not taking: Reported on 12/16/2021 10/09/21   Federico Flake, MD  ferrous sulfate (FERROUSUL) 325 (65 FE) MG tablet Take 1 tablet (325 mg total) by mouth every other day. 09/26/21   Federico Flake, MD  furosemide (LASIX) 20 MG tablet Take 1 tablet (20 mg total) by mouth daily for 1 day. 03/05/22 03/06/22  Worthy Rancher, MD  ibuprofen (ADVIL) 600 MG tablet Take 1 tablet (600 mg total) by mouth every 6 (six) hours as needed (pain). 03/05/22   Worthy Rancher, MD  NIFEdipine (ADALAT CC) 30 MG 24 hr tablet Take 1 tablet (30 mg total) by mouth daily. 03/05/22   Worthy Rancher, MD  oxyCODONE (OXY IR/ROXICODONE) 5 MG immediate release tablet Take 1 tablet (5 mg total) by mouth every 6 (six) hours as needed for severe pain or breakthrough pain. 03/06/22   Federico Flake, MD  Prenatal Vit-Fe Fumarate-FA (PREPLUS) 27-1 MG TABS Take 1 tablet by mouth daily.    [provider]     Vital Signs: BP 124/71 (BP Location: Right  Arm)   Pulse (!) 107   Temp 99.2 F (37.3 C)   Resp 20   LMP 05/27/2021   SpO2 96%   Breastfeeding Yes   Physical Exam Constitutional:      General: She is not in acute distress. Pulmonary:     Effort: Pulmonary effort is normal.  Abdominal:     Tenderness: There is abdominal tenderness.     Comments: Lower mid abdominal drain to gravity. Approximately 50 ml of thin sanguineous fluid in gravity bag. Drain flushed earlier today by Charity fundraiser without difficulty.   Skin:    General: Skin is warm and dry.  Neurological:     Mental Status: She is alert and oriented to person, place, and time.     Imaging: CT IMAGE GUIDED DRAINAGE BY PERCUTANEOUS CATHETER  Result Date: 03/11/2022 INDICATION: Postoperative collection EXAM: CT-guided drain placement into the abdominal fluid collection TECHNIQUE: Multidetector CT imaging of the abdomen and pelvis was performed following the standard protocol without IV contrast. RADIATION DOSE REDUCTION: This exam was performed according to the departmental dose-optimization program which includes automated exposure control, adjustment of the mA and/or kV according to patient size and/or use of iterative reconstruction technique. MEDICATIONS: The patient is currently admitted to the hospital and receiving intravenous antibiotics. The antibiotics were administered within an appropriate time frame prior to the initiation of the procedure. ANESTHESIA/SEDATION: Moderate (conscious) sedation was employed during this procedure. A total of Versed 3 mg and Fentanyl 150  mcg was administered intravenously by the radiology nurse. Total intra-service moderate Sedation Time: 19 minutes. The patient's level of consciousness and vital signs were monitored continuously by radiology nursing throughout the procedure under my direct supervision. COMPLICATIONS: None immediate. PROCEDURE: Informed written consent was obtained from the patient after a thorough discussion of the procedural  risks, benefits and alternatives. All questions were addressed. Maximal Sterile Barrier Technique was utilized including caps, mask, sterile gowns, sterile gloves, sterile drape, hand hygiene and skin antiseptic. A timeout was performed prior to the initiation of the procedure. The patient was placed supine on the exam table. Limited CT of the abdomen and pelvis was performed for planning purposes. This again demonstrated a large intra-abdominal/pelvic anterior fluid collection. Skin entry site was marked, and the overlying skin was prepped and draped in a standard sterile fashion. Local analgesia was obtained with 1% lidocaine. Using intermittent CT fluoroscopy, a 19 gauge Yueh needle was advanced into the fluid collection using a right lateral approach. Location was confirmed with CT, and spontaneous return of thin hemorrhagic fluid. Given size and clinical concern for infection, the decision was made to proceed with drainage catheter placement. An 035 wire was easily advanced into the collection through the [facility] catheter. Subsequently, percutaneous tract was serially dilated to accommodate a 12 [place] multipurpose locking drainage catheter. Location was confirmed with CT and additional return of hemorrhagic fluid. A sample was sent to the lab for microbiology analysis. Locking loop was formed, and the catheter was secured to skin using silk suture and a dressing. It was attached to bag drainage. The patient tolerated the procedure well without immediate complication. IMPRESSION: Successful CT-guided placement of a 12 French locking drainage catheter into the abdominopelvic collection. Findings compatible with postoperative liquified hematoma. A sample was sent for microbiology analysis. If the collection is determined to be sterile, consider drainage catheter removal after 48-72 hours to minimize risk of secondary infection. Electronically Signed   By: Olive Bass M.D.   On: 03/11/2022 10:21   CT ABDOMEN  PELVIS W CONTRAST  Result Date: 03/10/2022 CLINICAL DATA:  Abdominal pain, postop sepsis, postop following C-section with diffuse abdominal pain. EXAM: CT ABDOMEN AND PELVIS WITH CONTRAST TECHNIQUE: Multidetector CT imaging of the abdomen and pelvis was performed using the standard protocol following bolus administration of intravenous contrast. RADIATION DOSE REDUCTION: This exam was performed according to the departmental dose-optimization program which includes automated exposure control, adjustment of the mA and/or kV according to patient size and/or use of iterative reconstruction technique. CONTRAST:  80mL OMNIPAQUE IOHEXOL 300 MG/ML  SOLN COMPARISON:  Postoperative ultrasound earlier today at 17:44. FINDINGS: Lower chest: There are bilateral trace pleural effusions. Lung bases are otherwise clear. The cardiac size is normal. Hepatobiliary: 21.5 cm in length mildly steatotic liver. No mass enhancement or laceration. The gallbladder bile ducts are unremarkable. Pancreas: Unremarkable. Spleen: No mass enhancement or splenomegaly. Adrenals/Urinary Tract: There is no adrenal mass. Portions of the left nephrogram show striations concerning for pyelonephritis. Embolic disease could appear similar but there is normal opacification in the renal arteries and veins on both sides. There are few small low-density lesions in both kidneys which are too small to characterize. No urinary stone is seen. The ureters are free of filling defects and the bladder thickness is normal. Stomach/Bowel: The stomach and small bowel show no dilatation or wall thickening. The appendix is not seen. There are thickened folds in the distal ascending colon versus nondistention. Remainder of the large intestine wall is unremarkable. Vascular/Lymphatic:  The hepatic portal vein and aorta opacify normally. There is pelvic venous congestion. No adenopathy is seen. Reproductive: Enlarged uterus consistent with recent postpartum state. There is  heterogeneous and possibly enhancing material in the uterine cavity. The C-section incision is difficult to see but it is probably in the ventral lower uterine segment with uterus anteverted. The ovaries are not well seen due to overlapping structures but no obvious ovarian mass is evident. Other: There is a large heterogeneous fluid collection centered in the pelvis ventral to the uterus which is consistent with a hematoma/hemoperitoneum. Active bleeding is not excluded. There are few tiny air pockets in some of the fluid which could be due to the recent open procedure or an infectious complication. The collection is roughly U shaped and extends from the area of the distal paracolic gutters inferiorly anterior to the uterus above the bladder, and is in contact with both adnexal areas. Maximum measurements are up to 20 cm transverse, as large as 9 cm AP and at most 15 cm craniocaudal on both sides with additional mild fluid or blood in the pelvic cul-de-sac small amount of lower density fluid in the left-greater-than-right upper abdomen. There are mesenteric edematous features throughout the abdomen and pelvis which could be congestive versus edema related to peritonitis, or edema related to the recent surgery. Outside of the air within the collection no other free air is seen. There is no incarcerated hernia. There is a small umbilical fat hernia. Musculoskeletal: There are bilateral L5 pars defects with a chronic appearance without spondylolisthesis. No other significant osseous findings. IMPRESSION: 1. Large pelvic hematoma/hemoperitoneum. Detailed description above. Active bleeding not excluded. 2. Scattered air pockets in the collection could represent introduced air from the surgery or infectious complication. 3. Heterogeneous and possibly enhancing intracavitary material in the uterus. Retained products of conception not excluded. 4. Lesser lower-density fluid in the upper abdomen without localization. No  other free air. 5. Generalized mesenteric edema which could relate to surgery, peritonitis or fluid overload. There is also pelvic venous congestion with the deep pelvic veins without visible filling defects. 6. Colitis versus nondistention of the distal ascending colon. 7. Striation of portions of the left nephrogram concerning for pyelonephritis. Embolic disease could appear similar but the renal arteries and veins both opacify well. Correlate with urinalysis findings. No bladder thickening. 8. 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 No spondylolisthesis. 9. Discussed over the phone with Dr. Charlotta Newton at 12:01 a.m., 03/10/2022. Electronically Signed   By: Almira Bar M.D.   On: 03/10/2022 00:07   US Pelvis Complete  Result Date: 03/09/2022 CLINICAL DATA:  Postpartum vaginal bleeding. EXAM: TRANSABDOMINAL ULTRASOUND OF PELVIS TECHNIQUE: Transabdominal ultrasound examination of the pelvis was performed including evaluation of the uterus, ovaries, adnexal regions, and pelvic cul-de-sac. COMPARISON:  None Available. FINDINGS: Uterus Measurements: 15.2 cm x 6.2 cm x 9.0 cm = volume: 446.3 mL. No fibroids or other mass visualized. Endometrium Thickness: 24.0 mm. A small amount of fluid is seen within the endometrial canal. Right ovary Measurements: 3.0 cm x 2.6 cm x 2.5 cm = volume: 9.9 mL. A large, complex, multi-septated fluid collection is seen extending from the anterior aspect of the right adnexa to the left adnexa. This measures approximately 16 cm, as per the ultrasound technologist (no ultrasonographic measurements provided). Left ovary Measurements: 3.4 cm x 2.2 cm x 3.3 cm = volume: 126.2 mL. Normal appearance/no adnexal mass. Other findings:  No abnormal free fluid. IMPRESSION: 1. Enlarged, postpartum uterus with a large anterior,  complex fluid collection, as described above. Further evaluation with abdomen and pelvis CT is recommended. Electronically Signed   By: Aram Candelahaddeus  Houston M.D.   On:  03/09/2022 17:44    Labs:  CBC: Recent Labs    03/05/22 0511 03/09/22 1628 03/10/22 0440 03/11/22 0346  WBC 18.6* 21.3* 21.5* 20.7*  HGB 8.5* 10.2* 10.4* 10.0*  HCT 25.2* 29.8* 31.5* 30.0*  PLT 251 687* 708* 735*    COAGS: Recent Labs    03/09/22 1741  INR 1.4*  APTT 37*    BMP: Recent Labs    03/05/22 0511 03/09/22 1628 03/10/22 0440 03/11/22 0346  NA 137 134* 139 136  K 3.2* 2.7* 3.0* 3.2*  CL 106 101 103 104  CO2 22 23 26 22   GLUCOSE 83 97 74 80  BUN 5* 6 <5* 5*  CALCIUM 7.9* 8.0* 8.0* 7.8*  CREATININE 0.68 0.79 0.74 0.72  GFRNONAA >60 >60 >60 >60    LIVER FUNCTION TESTS: Recent Labs    03/05/22 0511 03/09/22 1628 03/10/22 0440 03/11/22 0346  BILITOT 0.6 0.8 0.8 0.7  AST 18 13* 14* 16  ALT 12 9 8 9   ALKPHOS 84 79 71 62  PROT 4.9* 6.2* 5.7* 5.4*  ALBUMIN 2.0* 2.0* 1.7* 1.6*    Assessment and Plan:  Cesarean section with post-op pelvic fluid collection s/p drain placement in IR 03/10/22 by Dr. Juliette AlcideEl-Abd.   Almost one liter of output since drain was placed. Patient feels well other than expected abdominal discomfort from drain placement and recent c-section. She is afebrile. Labs and vitals are stable.   Drain Location: Pelvis Size: Fr size: 12 Fr Date of placement: 03/10/22  Currently to: Drain collection device: gravity 24 hour output:  Output by Drain (mL) 03/09/22 0701 - 03/09/22 1900 03/09/22 1901 - 03/10/22 0700 03/10/22 0701 - 03/10/22 1900 03/10/22 1901 - 03/11/22 0700 03/11/22 0701 - 03/11/22 1349  Closed System Drain 1 Right RLQ 12 Fr.   600 250 50    Interval imaging/drain manipulation:  None  Current examination: Insertion site unremarkable. Suture and stat lock in place. Dressed appropriately.   Plan: Continue TID flushes with 5 cc NS. Record output Q shift. Dressing changes QD or PRN if soiled.  Call IR APP or on call IR MD if difficulty flushing or sudden change in drain output.  Repeat imaging/possible drain injection  once output < 10 mL/QD (excluding flush material.)  Discharge planning: Please contact IR APP or on call IR MD prior to patient d/c to ensure appropriate follow up plans are in place. Typically patient will follow up with IR clinic 10-14 days post d/c for repeat imaging/possible drain injection. IR scheduler will contact patient with date/time of appointment. Patient will need to flush drain QD with 5 cc NS, record output QD, dressing changes every 2-3 days or earlier if soiled.   IR will continue to follow - please call with questions or concerns.    Electronically Signed: Alwyn RenJamie Carling Liberman, AGACNP-BC [phone] 03/11/2022, 1:39 PM   I spent a total of 15 Minutes at the the patient's bedside AND on the patient's hospital floor or unit, greater than 50% of which was counseling/coordinating care for pelvic fluid collection drain.

## 2022-03-11 NOTE — Plan of Care (Signed)
  Problem: Nutrition: Goal: Adequate nutrition will be maintained Outcome: Completed/Met   Problem: Coping: Goal: Level of anxiety will decrease Outcome: Completed/Met   Problem: Elimination: Goal: Will not experience complications related to urinary retention Outcome: Completed/Met   Problem: Respiratory: Goal: Ability to maintain adequate ventilation will improve Outcome: Completed/Met

## 2022-03-12 LAB — CBC WITH DIFFERENTIAL/PLATELET
Abs Immature Granulocytes: 0.3 10*3/uL — ABNORMAL HIGH (ref 0.00–0.07)
Basophils Absolute: 0 10*3/uL (ref 0.0–0.1)
Basophils Relative: 0 %
Eosinophils Absolute: 0.1 10*3/uL (ref 0.0–0.5)
Eosinophils Relative: 1 %
HCT: 25.3 % — ABNORMAL LOW (ref 36.0–46.0)
Hemoglobin: 8.4 g/dL — ABNORMAL LOW (ref 12.0–15.0)
Immature Granulocytes: 2 %
Lymphocytes Relative: 14 %
Lymphs Abs: 2.2 10*3/uL (ref 0.7–4.0)
MCH: 29.9 pg (ref 26.0–34.0)
MCHC: 33.2 g/dL (ref 30.0–36.0)
MCV: 90 fL (ref 80.0–100.0)
Monocytes Absolute: 0.9 10*3/uL (ref 0.1–1.0)
Monocytes Relative: 6 %
Neutro Abs: 12.5 10*3/uL — ABNORMAL HIGH (ref 1.7–7.7)
Neutrophils Relative %: 77 %
Platelets: 717 10*3/uL — ABNORMAL HIGH (ref 150–400)
RBC: 2.81 MIL/uL — ABNORMAL LOW (ref 3.87–5.11)
RDW: 13.8 % (ref 11.5–15.5)
WBC: 16 10*3/uL — ABNORMAL HIGH (ref 4.0–10.5)
nRBC: 0 % (ref 0.0–0.2)

## 2022-03-12 LAB — BASIC METABOLIC PANEL
Anion gap: 7 (ref 5–15)
BUN: <5 mg/dL — ABNORMAL LOW (ref 6–20)
CO2: 22 mmol/L (ref 22–32)
Calcium: 7.8 mg/dL — ABNORMAL LOW (ref 8.9–10.3)
Chloride: 110 mmol/L (ref 98–111)
Creatinine, Ser: 0.72 mg/dL (ref 0.44–1.00)
GFR, Estimated: >60 mL/min (ref >60)
Glucose, Bld: 90 mg/dL (ref 70–99)
Potassium: 3.5 mmol/L (ref 3.5–5.1)
Sodium: 139 mmol/L (ref 135–145)

## 2022-03-12 MED ORDER — IBUPROFEN 800 MG PO TABS
800.0000 mg | ORAL_TABLET | Freq: Three times a day (TID) | ORAL | Status: DC | PRN
  Administered 2022-03-15 – 2022-03-17 (×4): 800 mg via ORAL
  Filled 2022-03-12 (×5): qty 1

## 2022-03-12 MED ORDER — SODIUM CHLORIDE 0.9 % IV SOLN
500.0000 mg | Freq: Once | INTRAVENOUS | Status: AC
  Administered 2022-03-12: 500 mg via INTRAVENOUS
  Filled 2022-03-12: qty 25

## 2022-03-12 NOTE — Progress Notes (Signed)
    [facility] OB/[facility]N Attending Note  Subjective:  Patient reported feeling a lot better.  Less pain that previously.  No other complaints .    Objective:   Patient Vitals for the past 24 hrs:  BP Temp Temp src Pulse Resp SpO2 Height Weight  03/12/22 0737 131/85 99 F (37.2 C) Oral 99 18 99 % -- --  03/12/22 0333 125/70 99.2 F (37.3 C) Oral 95 16 95 % -- --  03/11/22 2305 126/76 99.6 F (37.6 C) Oral (!) 104 16 97 % -- --  03/11/22 1958 -- -- -- -- -- -- 5\' 2"  (1.575 m) 73.8 kg  03/11/22 1935 126/78 99.3 F (37.4 C) Oral (!) 101 18 99 % -- --  03/11/22 1634 115/69 98.7 F (37.1 C) Oral 96 20 98 % -- --  03/11/22 1155 124/71 99.2 F (37.3 C) -- (!) 107 20 96 % -- --    I/O last 3 completed shifts: In: 5847.8 [P.O.:2460; I.V.:2636.6; Other:57.4; IV Piggyback:693.8] Out: 1975 [Urine:1500; Drains:475]  Gen: NAD HENT: Normocephalic, atraumatic Lungs: Normal respiratory effort Heart: Regular rate noted Abdomen: soft, moderate TTP, incision C/D/I,  RLQ IR drain in place with some sanguinous fluid in bag.  Cervix: Deferred Ext: 2+ DTRs, no edema, no cyanosis, negative Homan's sign  Studies:    Latest Ref Rng & Units 03/12/2022    4:48 AM 03/11/2022    3:46 AM 03/10/2022    4:40 AM  CBC  WBC 4.0 - 10.5 K/uL 16.0  20.7  21.5   Hemoglobin 12.0 - 15.0 g/dL 8.4  10.0  10.4   Hematocrit 36.0 - 46.0 % 25.3  30.0  31.5   Platelets 150 - 400 K/uL 717  735  708       Latest Ref Rng & Units 03/12/2022    4:48 AM 03/11/2022    3:46 AM 03/10/2022    4:40 AM  CMP  Glucose 70 - 99 mg/dL 90  80  74   BUN 6 - 20 mg/dL <5  5  <5   Creatinine 0.44 - 1.00 mg/dL 0.72  0.72  0.74   Sodium 135 - 145 mmol/L 139  136  139   Potassium 3.5 - 5.1 mmol/L 3.5  3.2  3.0   Chloride 98 - 111 mmol/L 110  104  103   CO2 22 - 32 mmol/L 22  22  26    Calcium 8.9 - 10.3 mg/dL 7.8  7.8  8.0   Total Protein 6.5 - 8.1 g/dL  5.4  5.7   Total Bilirubin 0.3 - 1.2 mg/dL  0.7  0.8   Alkaline Phos 38 -  126 U/L  62  71   AST 15 - 41 U/L  16  14   ALT 0 - 44 U/L  9  8     Assessment & Plan:  26 y.o. G1P1001 admitted for postoperative hemoperitoneum and endometritis. She is s/p IR drain placement on 6/12.  Has put out over 1200 ml of sanguinous drainage since then.  No signs of active bleeding. She does have acute blood loss anemia, counseled about Venofer, and this was ordered.    No fevers since 0830 6/13, reassured. Continue Zosyn and Vancomycin.  Continue to monitor. Lovenox for VTE ppx, also encouraged OOB Analgesia as needed. Continue close observation.   Verita Schneiders, MD, [place] for [employer], [place]

## 2022-03-12 NOTE — Progress Notes (Signed)
  RLQ drain placed 6/13  Drain Location: RLQ Size: Fr size: 12 Fr Date of placement: 03/11/22  Currently to: Drain collection device: gravity 24 hour output:  Output by Drain (mL) 03/10/22 0701 - 03/10/22 1900 03/10/22 1901 - 03/11/22 0700 03/11/22 0701 - 03/11/22 1900 03/11/22 1901 - 03/12/22 0700 03/12/22 0701 - 03/12/22 1201  Closed System Drain 1 Right RLQ 12 Fr. 600 250 125 100 10    Interval imaging/drain manipulation:  None  Current examination: Flushes/aspirates easily. Flushed 10 cc sterile saline Insertion site unremarkable. Suture and stat lock in place. Dressed appropriately.  OP bloody Copious still 40 cc in bag this am   Plan: Continue TID flushes with 5 cc NS. Record output Q shift. Dressing changes QD or PRN if soiled.  Call IR APP or on call IR MD if difficulty flushing or sudden change in drain output.  Repeat imaging/possible drain injection once output < 10 mL/QD (excluding flush material.)  Discharge planning: Please contact IR APP or on call IR MD prior to patient d/c to ensure appropriate follow up plans are in place. Typically patient will follow up with IR clinic 10-14 days post d/c for repeat imaging/possible drain injection. IR scheduler will contact patient with date/time of appointment. Patient will need to flush drain QD with 5 cc NS, record output QD, dressing changes every 2-3 days or earlier if soiled.   IR will continue to follow - please call with questions or concerns.

## 2022-03-13 LAB — BASIC METABOLIC PANEL
Anion gap: 10 (ref 5–15)
BUN: <5 mg/dL — ABNORMAL LOW (ref 6–20)
CO2: 19 mmol/L — ABNORMAL LOW (ref 22–32)
Calcium: 7.8 mg/dL — ABNORMAL LOW (ref 8.9–10.3)
Chloride: 108 mmol/L (ref 98–111)
Creatinine, Ser: 0.73 mg/dL (ref 0.44–1.00)
GFR, Estimated: >60 mL/min (ref >60)
Glucose, Bld: 73 mg/dL (ref 70–99)
Potassium: 3.5 mmol/L (ref 3.5–5.1)
Sodium: 137 mmol/L (ref 135–145)

## 2022-03-13 MED ORDER — SODIUM CHLORIDE 0.9 % IV SOLN
2.0000 g | Freq: Every day | INTRAVENOUS | Status: DC
  Administered 2022-03-13 – 2022-03-16 (×4): 2 g via INTRAVENOUS
  Filled 2022-03-13 (×5): qty 20

## 2022-03-13 NOTE — Progress Notes (Signed)
Faculty Practice OB/GYN Attending Note  Subjective:  Patient reported feeling a lot better.  Much less pain that previously.  Wants to go home but worried about her fevers. No other complaints .    Objective:   Patient Vitals for the past 24 hrs:  BP Temp Temp src Pulse Resp SpO2  03/13/22 1622 128/80 99.2 F (37.3 C) Oral 79 18 98 %  03/13/22 1147 132/78 99.4 F (37.4 C) Oral 83 18 98 %  03/13/22 0905 131/76 98.7 F (37.1 C) Oral 70 19 100 %  03/13/22 0614 -- 98.8 F (37.1 C) Oral -- -- --  03/13/22 0400 -- 100.3 F (37.9 C) Oral -- -- --  03/13/22 0240 138/80 (!) 101.3 F (38.5 C) Oral 91 17 96 %  03/13/22 0223 -- (!) 102 F (38.9 C) Oral -- -- --  03/12/22 2358 134/84 (!) 101 F (38.3 C) Oral 97 17 --  03/12/22 1959 136/77 98.9 F (37.2 C) Oral 75 16 100 %    I/O last 3 completed shifts: In: 4385.8 [P.O.:1490; I.V.:1882.1; Other:62.4; IV Piggyback:951.3] Out: 2040 [Urine:1850; Drains:190]  Gen: NAD HENT: Normocephalic, atraumatic Lungs: Normal respiratory effort Heart: Regular rate noted Abdomen: soft, mild TTP, incision C/D/I,  RLQ IR drain in place with some sanguinous fluid in bag.  Cervix: Deferred Ext: 2+ DTRs, no edema, no cyanosis, negative Homan's sign  Studies:    Latest Ref Rng & Units 03/12/2022    4:48 AM 03/11/2022    3:46 AM 03/10/2022    4:40 AM  CBC  WBC 4.0 - 10.5 K/uL 16.0  20.7  21.5   Hemoglobin 12.0 - 15.0 g/dL 8.4  93.2  67.1   Hematocrit 36.0 - 46.0 % 25.3  30.0  31.5   Platelets 150 - 400 K/uL 717  735  708       Latest Ref Rng & Units 03/13/2022    4:49 AM 03/12/2022    4:48 AM 03/11/2022    3:46 AM  CMP  Glucose 70 - 99 mg/dL 73  90  80   BUN 6 - 20 mg/dL <5  <5  5   Creatinine 0.44 - 1.00 mg/dL 2.45  8.09  9.83   Sodium 135 - 145 mmol/L 137  139  136   Potassium 3.5 - 5.1 mmol/L 3.5  3.5  3.2   Chloride 98 - 111 mmol/L 108  110  104   CO2 22 - 32 mmol/L 19  22  22    Calcium 8.9 - 10.3 mg/dL 7.8  7.8  7.8   Total Protein  6.5 - 8.1 g/dL   5.4   Total Bilirubin 0.3 - 1.2 mg/dL   0.7   Alkaline Phos 38 - 126 U/L   62   AST 15 - 41 U/L   16   ALT 0 - 44 U/L   9    Results for orders placed or performed during the hospital encounter of 03/09/22  Culture, blood (Routine X 2) w Reflex to ID Panel     Status: None (Preliminary result)   Collection Time: 03/09/22  4:28 PM   Specimen: BLOOD RIGHT HAND  Result Value Ref Range Status   Specimen Description BLOOD RIGHT HAND  Final   Special Requests   Final    BOTTLES DRAWN AEROBIC AND ANAEROBIC Blood Culture results may not be optimal due to an inadequate volume of blood received in culture bottles   Culture   Final    NO  GROWTH 4 DAYS Performed at [facility], [place] [address], [place], [place] [address]    Report Status PENDING  Incomplete  Culture, blood (Routine X 2) w Reflex to ID Panel     Status: None (Preliminary result)   Collection Time: 03/09/22  4:36 PM   Specimen: BLOOD  Result Value Ref Range Status   Specimen Description BLOOD LEFT ANTECUBITAL  Final   Special Requests   Final    BOTTLES DRAWN AEROBIC AND ANAEROBIC Blood Culture results may not be optimal due to an inadequate volume of blood received in culture bottles   Culture   Final    NO GROWTH 4 DAYS Performed at [facility], [place] [address], [place], [place] [address]    Report Status PENDING  Incomplete  Culture, OB Urine     Status: None   Collection Time: 03/10/22  3:13 AM   Specimen: OB Clean Catch; Urine  Result Value Ref Range Status   Specimen Description OB CLEAN CATCH  Final   Special Requests NONE  Final   Culture   Final    NO GROWTH Performed at [facility], [place] [address] [place], [place] [address]    Report Status 03/11/2022 FINAL  Final  Aerobic/Anaerobic Culture w Gram Stain (surgical/deep wound)     Status: None (Preliminary result)   Collection Time: 03/10/22  4:00 PM   Specimen: Wound; Abscess  Result Value Ref Range Status   Specimen  Description WOUND  Final   Special Requests ABDOMINAL ABSCESS  Final   Gram Stain   Final    ABUNDANT WBC PRESENT,BOTH PMN AND MONONUCLEAR NO ORGANISMS SEEN Performed at [facility], [address] [place], [place] [address]    Culture   Final    ABUNDANT KLEBSIELLA PNEUMONIAE FEW ESCHERICHIA COLI NO ANAEROBES ISOLATED; CULTURE IN PROGRESS FOR 5 DAYS    Report Status PENDING  Incomplete   Organism ID, Bacteria KLEBSIELLA PNEUMONIAE  Final   Organism ID, Bacteria ESCHERICHIA COLI  Final      Susceptibility   Escherichia coli - MIC*    AMPICILLIN <=2 SENSITIVE Sensitive     CEFAZOLIN <=4 SENSITIVE Sensitive     CEFEPIME <=0.12 SENSITIVE Sensitive     CEFTAZIDIME <=1 SENSITIVE Sensitive     CEFTRIAXONE <=0.25 SENSITIVE Sensitive     CIPROFLOXACIN <=0.25 SENSITIVE Sensitive     GENTAMICIN <=1 SENSITIVE Sensitive     IMIPENEM <=0.25 SENSITIVE Sensitive     TRIMETH/SULFA <=20 SENSITIVE Sensitive     AMPICILLIN/SULBACTAM <=2 SENSITIVE Sensitive     PIP/TAZO <=4 SENSITIVE Sensitive     * FEW ESCHERICHIA COLI   Klebsiella pneumoniae - MIC*    AMPICILLIN >=32 RESISTANT Resistant     CEFAZOLIN <=4 SENSITIVE Sensitive     CEFEPIME <=0.12 SENSITIVE Sensitive     CEFTAZIDIME <=1 SENSITIVE Sensitive     CEFTRIAXONE <=0.25 SENSITIVE Sensitive     CIPROFLOXACIN <=0.25 SENSITIVE Sensitive     GENTAMICIN <=1 SENSITIVE Sensitive     IMIPENEM 0.5 SENSITIVE Sensitive     TRIMETH/SULFA >=320 RESISTANT Resistant     AMPICILLIN/SULBACTAM 8 SENSITIVE Sensitive     PIP/TAZO 8 SENSITIVE Sensitive     * ABUNDANT KLEBSIELLA PNEUMONIAE    Assessment & Plan:  26 y.o. G1P1001 admitted for postoperative hemoperitoneum/abdominal abscess and endometritis. She is s/p IR drain placement on 6/12.  Has put out over 1200 ml of sanguinous drainage since then.  No signs of  active bleeding. She does have acute blood loss anemia, was given Venofer on 6/14 Still having fevers, last one was 0240 this morning.  Drain cultures showed E.coli and Klebsiella, antibiotics changed to Rocephin given sensitivities.  Continue to monitor.   Lovenox for VTE ppx, also encouraged OOB Analgesia as needed. Continue close observation.   Jaynie Collins, MD, FACOG Obstetrician & Gynecologist, [facility] for [employer], [facility]

## 2022-03-14 LAB — BASIC METABOLIC PANEL
Anion gap: 7 (ref 5–15)
BUN: <5 mg/dL — ABNORMAL LOW (ref 6–20)
CO2: 22 mmol/L (ref 22–32)
Calcium: 7.8 mg/dL — ABNORMAL LOW (ref 8.9–10.3)
Chloride: 110 mmol/L (ref 98–111)
Creatinine, Ser: 0.85 mg/dL (ref 0.44–1.00)
GFR, Estimated: >60 mL/min (ref >60)
Glucose, Bld: 85 mg/dL (ref 70–99)
Potassium: 3.4 mmol/L — ABNORMAL LOW (ref 3.5–5.1)
Sodium: 139 mmol/L (ref 135–145)

## 2022-03-14 LAB — CULTURE, BLOOD (ROUTINE X 2)
Culture: NO GROWTH
Culture: NO GROWTH

## 2022-03-14 LAB — URINALYSIS, ROUTINE W REFLEX MICROSCOPIC
Bilirubin Urine: NEGATIVE
Glucose, UA: NEGATIVE mg/dL
Ketones, ur: 20 mg/dL — AB
Nitrite: NEGATIVE
Protein, ur: NEGATIVE mg/dL
RBC / HPF: >50 RBC/hpf — ABNORMAL HIGH (ref 0–5)
Specific Gravity, Urine: 1.009 (ref 1.005–1.030)
WBC, UA: >50 WBC/hpf — ABNORMAL HIGH (ref 0–5)
pH: 5 (ref 5.0–8.0)

## 2022-03-14 MED ORDER — NIFEDIPINE ER OSMOTIC RELEASE 30 MG PO TB24
30.0000 mg | ORAL_TABLET | Freq: Every day | ORAL | Status: DC
  Administered 2022-03-14 – 2022-03-18 (×5): 30 mg via ORAL
  Filled 2022-03-14 (×5): qty 1

## 2022-03-14 NOTE — Progress Notes (Signed)
Faculty Practice OB/GYN Attending Note  Subjective:  Patient reported much less pain that previously.  Wants to go home but worried about her fevers. No other complaints .    Objective:   Patient Vitals for the past 24 hrs:  BP Temp Temp src Pulse Resp SpO2  03/14/22 0900 138/72 99.1 F (37.3 C) Oral 67 18 97 %  03/14/22 0825 (!) 143/74 99 F (37.2 C) Oral 86 18 98 %  03/14/22 0633 -- 99.8 F (37.7 C) Oral -- -- --  03/14/22 0400 (!) 142/76 (!) 101.2 F (38.4 C) Oral 77 18 97 %  03/14/22 0006 128/73 99.8 F (37.7 C) Oral 94 17 98 %  03/13/22 1937 (!) 146/73 99.6 F (37.6 C) Oral 69 17 98 %  03/13/22 1622 128/80 99.2 F (37.3 C) Oral 79 18 98 %  03/13/22 1147 132/78 99.4 F (37.4 C) Oral 83 18 98 %    I/O last 3 completed shifts: In: 990.5 [P.O.:840; I.V.:125; Other:5; IV Piggyback:20.5] Out: 2700 [Urine:2550; Drains:150]  Gen: NAD HENT: Normocephalic, atraumatic Lungs: Normal respiratory effort Heart: Regular rate noted Abdomen: soft, mild TTP, incision C/D/I,  RLQ IR drain in place with some sanguinous fluid in bag.  Cervix: Deferred Ext: 2+ DTRs, no edema, no cyanosis, negative Homan's sign  Studies:    Latest Ref Rng & Units 03/12/2022    4:48 AM 03/11/2022    3:46 AM 03/10/2022    4:40 AM  CBC  WBC 4.0 - 10.5 K/uL 16.0  20.7  21.5   Hemoglobin 12.0 - 15.0 g/dL 8.4  37.0  48.8   Hematocrit 36.0 - 46.0 % 25.3  30.0  31.5   Platelets 150 - 400 K/uL 717  735  708       Latest Ref Rng & Units 03/14/2022    4:27 AM 03/13/2022    4:49 AM 03/12/2022    4:48 AM  CMP  Glucose 70 - 99 mg/dL 85  73  90   BUN 6 - 20 mg/dL <5  <5  <5   Creatinine 0.44 - 1.00 mg/dL 8.91  6.94  5.03   Sodium 135 - 145 mmol/L 139  137  139   Potassium 3.5 - 5.1 mmol/L 3.4  3.5  3.5   Chloride 98 - 111 mmol/L 110  108  110   CO2 22 - 32 mmol/L 22  19  22    Calcium 8.9 - 10.3 mg/dL 7.8  7.8  7.8    Results for orders placed or performed during the hospital encounter of 03/09/22   Culture, blood (Routine X 2) w Reflex to ID Panel     Status: None (Preliminary result)   Collection Time: 03/09/22  4:28 PM   Specimen: BLOOD RIGHT HAND  Result Value Ref Range Status   Specimen Description BLOOD RIGHT HAND  Final   Special Requests   Final    BOTTLES DRAWN AEROBIC AND ANAEROBIC Blood Culture results may not be optimal due to an inadequate volume of blood received in culture bottles   Culture   Final    NO GROWTH 4 DAYS Performed at [facility], [address] [place], [place]    Report Status PENDING  Incomplete  Culture, blood (Routine X 2) w Reflex to ID Panel     Status: None (Preliminary result)   Collection Time: 03/09/22  4:36 PM   Specimen: BLOOD  Result Value Ref Range Status   Specimen Description BLOOD LEFT  ANTECUBITAL  Final   Special Requests   Final    BOTTLES DRAWN AEROBIC AND ANAEROBIC Blood Culture results may not be optimal due to an inadequate volume of blood received in culture bottles   Culture   Final    NO GROWTH 4 DAYS Performed at [facility], [address] [place], [place] [address]    Report Status PENDING  Incomplete  Culture, OB Urine     Status: None   Collection Time: 03/10/22  3:13 AM   Specimen: OB Clean Catch; Urine  Result Value Ref Range Status   Specimen Description OB CLEAN CATCH  Final   Special Requests NONE  Final   Culture   Final    NO GROWTH Performed at [facility], [address] [place], [place] [address]    Report Status 03/11/2022 FINAL  Final  Aerobic/Anaerobic Culture w Gram Stain (surgical/deep wound)     Status: None (Preliminary result)   Collection Time: 03/10/22  4:00 PM   Specimen: Wound; Abscess  Result Value Ref Range Status   Specimen Description WOUND  Final   Special Requests ABDOMINAL ABSCESS  Final   Gram Stain   Final    ABUNDANT WBC PRESENT,BOTH PMN AND MONONUCLEAR NO ORGANISMS SEEN Performed at [facility], [address], [place], [place]  [address]    Culture   Final    ABUNDANT KLEBSIELLA PNEUMONIAE FEW ESCHERICHIA COLI NO ANAEROBES ISOLATED; CULTURE IN PROGRESS FOR 5 DAYS    Report Status PENDING  Incomplete   Organism ID, Bacteria KLEBSIELLA PNEUMONIAE  Final   Organism ID, Bacteria ESCHERICHIA COLI  Final      Susceptibility   Escherichia coli - MIC*    AMPICILLIN <=2 SENSITIVE Sensitive     CEFAZOLIN <=4 SENSITIVE Sensitive     CEFEPIME <=0.12 SENSITIVE Sensitive     CEFTAZIDIME <=1 SENSITIVE Sensitive     CEFTRIAXONE <=0.25 SENSITIVE Sensitive     CIPROFLOXACIN <=0.25 SENSITIVE Sensitive     GENTAMICIN <=1 SENSITIVE Sensitive     IMIPENEM <=0.25 SENSITIVE Sensitive     TRIMETH/SULFA <=20 SENSITIVE Sensitive     AMPICILLIN/SULBACTAM <=2 SENSITIVE Sensitive     PIP/TAZO <=4 SENSITIVE Sensitive     * FEW ESCHERICHIA COLI   Klebsiella pneumoniae - MIC*    AMPICILLIN >=32 RESISTANT Resistant     CEFAZOLIN <=4 SENSITIVE Sensitive     CEFEPIME <=0.12 SENSITIVE Sensitive     CEFTAZIDIME <=1 SENSITIVE Sensitive     CEFTRIAXONE <=0.25 SENSITIVE Sensitive     CIPROFLOXACIN <=0.25 SENSITIVE Sensitive     GENTAMICIN <=1 SENSITIVE Sensitive     IMIPENEM 0.5 SENSITIVE Sensitive     TRIMETH/SULFA >=320 RESISTANT Resistant     AMPICILLIN/SULBACTAM 8 SENSITIVE Sensitive     PIP/TAZO 8 SENSITIVE Sensitive     * ABUNDANT KLEBSIELLA PNEUMONIAE    Assessment & Plan:  26 y.o. G1P1001 admitted for postoperative hemoperitoneum - abdominal abscess and endometritis. She is s/p IR drain placement on 6/12.  Has put out over 1300 ml of sanguinous drainage since then (130 ml yesterday). No signs of active bleeding. She does have acute blood loss anemia, was given Venofer on 6/14 Still having fevers, last one was 0400 this morning. Drain cultures showed E.coli and Klebsiella, antibiotics changed to Rocephin on 6/15 given sensitivities.  Continue to monitor.   Plan is for discharge when at least 48 hours afebrile, discussed with  patient. Lovenox/SCDs for VTE ppx, also encouraged  OOB Analgesia as needed. Continue close observation.   Jaynie Collins, MD, FACOG Obstetrician & Gynecologist, [facility] for [employer], [facility]

## 2022-03-14 NOTE — Progress Notes (Signed)
Referring Physician(s): Dr. Macon Large  Supervising Physician: Malachy Moan  Patient Status:  [facility] - In-pt  Chief Complaint:  Caesarean section with post-op pelvic fluid collection s/p percutaneous drain placement by IR 6/12  Subjective: Patient sitting up in bed breastfeeding baby. She states that her drain site has been "fine," without pain or discomfort, and that she has no concerns about the drain specifically. She does mention concern over her fever that was recorded this AM at 0400.  Allergies: Patient has no known allergies.  Medications: Prior to Admission medications   Medication Sig Start Date End Date Taking? Authorizing Provider  acetaminophen (TYLENOL) 500 MG tablet Take 2 tablets (1,000 mg total) by mouth every 8 (eight) hours as needed (pain). 03/05/22   Worthy Rancher, MD  Elastic Bandages & Supports (COMFORT FIT MATERNITY SUPP MED) MISC 1 Units by Does not apply route daily. Patient not taking: Reported on 12/16/2021 10/09/21   Federico Flake, MD  ferrous sulfate (FERROUSUL) 325 (65 FE) MG tablet Take 1 tablet (325 mg total) by mouth every other day. 09/26/21   Federico Flake, MD  furosemide (LASIX) 20 MG tablet Take 1 tablet (20 mg total) by mouth daily for 1 day. 03/05/22 03/06/22  Worthy Rancher, MD  ibuprofen (ADVIL) 600 MG tablet Take 1 tablet (600 mg total) by mouth every 6 (six) hours as needed (pain). 03/05/22   Worthy Rancher, MD  NIFEdipine (ADALAT CC) 30 MG 24 hr tablet Take 1 tablet (30 mg total) by mouth daily. 03/05/22   Worthy Rancher, MD  oxyCODONE (OXY IR/ROXICODONE) 5 MG immediate release tablet Take 1 tablet (5 mg total) by mouth every 6 (six) hours as needed for severe pain or breakthrough pain. 03/06/22   Federico Flake, MD  Prenatal Vit-Fe Fumarate-FA (PREPLUS) 27-1 MG TABS Take 1 tablet by mouth daily.    [provider]     Vital Signs: BP 137/69 (BP Location: Right Arm)   Pulse 66   Temp 99 F  (37.2 C) (Oral)   Resp 18   Ht 5\' 2"  (1.575 m)   Wt 162 lb 11.2 oz (73.8 kg)   LMP 05/27/2021   SpO2 98%   Breastfeeding Yes   BMI 29.76 kg/m   Physical Exam Constitutional:      General: She is not in acute distress. Cardiovascular:     Rate and Rhythm: Normal rate and regular rhythm.     Pulses: Normal pulses.     Comments: Unable to auscultate heart sounds due to baby breastfeeding at time of exam Pulmonary:     Effort: Pulmonary effort is normal. No respiratory distress.     Comments: Unable to auscultate lung sounds due to baby breastfeeding at time of exam Abdominal:     General: There is distension.     Palpations: Abdomen is soft.  Skin:    General: Skin is warm and dry.     Comments: Drain site clean, dry, and intact without signs of infection  Neurological:     Mental Status: She is alert and oriented to person, place, and time.  Psychiatric:        Mood and Affect: Mood normal.        Behavior: Behavior normal.    Drain Location: RLQ Size: Fr size: 12 Fr Date of placement: 03/10/22  Currently to: Drain collection device: gravity 24 hour output:  Output by Drain (mL) 03/12/22 0701 - 03/12/22 1900 03/12/22 1901 - 03/13/22 0700  03/13/22 0701 - 03/13/22 1900 03/13/22 1901 - 03/14/22 0700 03/14/22 0701 - 03/14/22 1440  Closed System Drain 1 Right RLQ 12 Fr. 70 20 90 40 10    Interval imaging/drain manipulation:  None  Current examination: Flushes/aspirates easily.  Insertion site unremarkable. Suture and stat lock in place. Dressed appropriately.  Drain output thin and dark red; bag holding approximately 20cc at time of exam.    Imaging: CT IMAGE GUIDED DRAINAGE BY PERCUTANEOUS CATHETER  Result Date: 03/11/2022 INDICATION: Postoperative collection EXAM: CT-guided drain placement into the abdominal fluid collection TECHNIQUE: Multidetector CT imaging of the abdomen and pelvis was performed following the standard protocol without IV contrast. RADIATION  DOSE REDUCTION: This exam was performed according to the departmental dose-optimization program which includes automated exposure control, adjustment of the mA and/or kV according to patient size and/or use of iterative reconstruction technique. MEDICATIONS: The patient is currently admitted to the hospital and receiving intravenous antibiotics. The antibiotics were administered within an appropriate time frame prior to the initiation of the procedure. ANESTHESIA/SEDATION: Moderate (conscious) sedation was employed during this procedure. A total of Versed 3 mg and Fentanyl 150 mcg was administered intravenously by the radiology nurse. Total intra-service moderate Sedation Time: 19 minutes. The patient's level of consciousness and vital signs were monitored continuously by radiology nursing throughout the procedure under my direct supervision. COMPLICATIONS: None immediate. PROCEDURE: Informed written consent was obtained from the patient after a thorough discussion of the procedural risks, benefits and alternatives. All questions were addressed. Maximal Sterile Barrier Technique was utilized including caps, mask, sterile gowns, sterile gloves, sterile drape, hand hygiene and skin antiseptic. A timeout was performed prior to the initiation of the procedure. The patient was placed supine on the exam table. Limited CT of the abdomen and pelvis was performed for planning purposes. This again demonstrated a large intra-abdominal/pelvic anterior fluid collection. Skin entry site was marked, and the overlying skin was prepped and draped in a standard sterile fashion. Local analgesia was obtained with 1% lidocaine. Using intermittent CT fluoroscopy, a 19 gauge Yueh needle was advanced into the fluid collection using a right lateral approach. Location was confirmed with CT, and spontaneous return of thin hemorrhagic fluid. Given size and clinical concern for infection, the decision was made to proceed with drainage catheter  placement. An 035 wire was easily advanced into the collection through the [facility] catheter. Subsequently, percutaneous tract was serially dilated to accommodate a 12 [place] multipurpose locking drainage catheter. Location was confirmed with CT and additional return of hemorrhagic fluid. A sample was sent to the lab for microbiology analysis. Locking loop was formed, and the catheter was secured to skin using silk suture and a dressing. It was attached to bag drainage. The patient tolerated the procedure well without immediate complication. IMPRESSION: Successful CT-guided placement of a 12 French locking drainage catheter into the abdominopelvic collection. Findings compatible with postoperative liquified hematoma. A sample was sent for microbiology analysis. If the collection is determined to be sterile, consider drainage catheter removal after 48-72 hours to minimize risk of secondary infection. Electronically Signed   By: Olive Bass M.D.   On: 03/11/2022 10:21    Labs:  CBC: Recent Labs    03/09/22 1628 03/10/22 0440 03/11/22 0346 03/12/22 0448  WBC 21.3* 21.5* 20.7* 16.0*  HGB 10.2* 10.4* 10.0* 8.4*  HCT 29.8* 31.5* 30.0* 25.3*  PLT 687* 708* 735* 717*    COAGS: Recent Labs    03/09/22 1741  INR 1.4*  APTT 37*  BMP: Recent Labs    03/11/22 0346 03/12/22 0448 03/13/22 0449 03/14/22 0427  NA 136 139 137 139  K 3.2* 3.5 3.5 3.4*  CL 104 110 108 110  CO2 22 22 19* 22  GLUCOSE 80 90 73 85  BUN 5* <5* <5* <5*  CALCIUM 7.8* 7.8* 7.8* 7.8*  CREATININE 0.72 0.72 0.73 0.85  GFRNONAA >60 >60 >60 >60    LIVER FUNCTION TESTS: Recent Labs    03/05/22 0511 03/09/22 1628 03/10/22 0440 03/11/22 0346  BILITOT 0.6 0.8 0.8 0.7  AST 18 13* 14* 16  ALT 12 9 8 9   ALKPHOS 84 79 71 62  PROT 4.9* 6.2* 5.7* 5.4*  ALBUMIN 2.0* 2.0* 1.7* 1.6*    Assessment and Plan:  Melissa Guzman is a 26 yo female s/p drain placement on 03/10/22 for post-Caesarean pelvic fluid collection. Patient  states that she feels comfortable with the drain and that she is not experiencing any pain or discomfort at her drain site. She had a fever of 101.58F at 0400 this AM that was down to 53F at 1139.  Plan: Continue TID flushes with 5 cc NS. Record output Q shift. Dressing changes QD or PRN if soiled.  Call IR APP or on call IR MD if difficulty flushing or sudden change in drain output.  Repeat imaging/possible drain injection once output < 10 mL/QD (excluding flush material.)  Discharge planning: Please contact IR APP or on call IR MD prior to patient d/c to ensure appropriate follow up plans are in place. Typically patient will follow up with IR clinic 10-14 days post d/c for repeat imaging/possible drain injection. IR scheduler will contact patient with date/time of appointment. Patient will need to flush drain QD with 5 cc NS, record output QD, dressing changes every 2-3 days or earlier if soiled.   IR will continue to follow - please call with questions or concerns.  Electronically Signed: 05/10/22, PA-C 03/14/2022, 2:33 PM   I spent a total of 15 Minutes at the the patient's bedside AND on the patient's hospital floor or unit, greater than 50% of which was counseling/coordinating care for pelvic fluid collection drain

## 2022-03-14 NOTE — Progress Notes (Signed)
Called to bedside per pt request.  She is concerned that she has a UTI. Notes diffuse low pelvic pain that feels different than the pain she had initially.  She has declined pain medication.  She denies dsyuria, but rhather feels like it feels better after she voids.  Notes urinary frequency.  Denies fever or chills.  O: BP 128/73 (BP Location: Right Arm)   Pulse 94   Temp 99.8 F (37.7 C) (Oral)   Resp 17   Ht 5\' 2"  (1.575 m)   Wt 73.8 kg   LMP 05/27/2021   SpO2 98%   Breastfeeding Yes   BMI 29.76 kg/m  Abd: soft, mild tenderness to palpation in lower abdomen, drain in place  A/P: -reviewed with patient that prior UA and culture has been completed -reviewed with patient that currently on IV antibiotics which would likely treat any infection that would present -pt again requesting this to be check, will plan to redraw UA with next void -suspect pain is likely due to endometritis/hemoperitoneum -encouraged pt to take pain medication as needed -continue with plan as previously outlined  05/29/2021, DO Attending Obstetrician & Gynecologist, [facility], [facility]

## 2022-03-15 LAB — AEROBIC/ANAEROBIC CULTURE W GRAM STAIN (SURGICAL/DEEP WOUND)

## 2022-03-15 LAB — CBC WITH DIFFERENTIAL/PLATELET
Abs Immature Granulocytes: 0.27 10*3/uL — ABNORMAL HIGH (ref 0.00–0.07)
Basophils Absolute: 0 10*3/uL (ref 0.0–0.1)
Basophils Relative: 0 %
Eosinophils Absolute: 0.1 10*3/uL (ref 0.0–0.5)
Eosinophils Relative: 1 %
HCT: 29.3 % — ABNORMAL LOW (ref 36.0–46.0)
Hemoglobin: 9.8 g/dL — ABNORMAL LOW (ref 12.0–15.0)
Immature Granulocytes: 2 %
Lymphocytes Relative: 11 %
Lymphs Abs: 1.9 10*3/uL (ref 0.7–4.0)
MCH: 29.7 pg (ref 26.0–34.0)
MCHC: 33.4 g/dL (ref 30.0–36.0)
MCV: 88.8 fL (ref 80.0–100.0)
Monocytes Absolute: 0.9 10*3/uL (ref 0.1–1.0)
Monocytes Relative: 5 %
Neutro Abs: 13.7 10*3/uL — ABNORMAL HIGH (ref 1.7–7.7)
Neutrophils Relative %: 81 %
Platelets: 988 10*3/uL (ref 150–400)
RBC: 3.3 MIL/uL — ABNORMAL LOW (ref 3.87–5.11)
RDW: 13.5 % (ref 11.5–15.5)
WBC: 16.9 10*3/uL — ABNORMAL HIGH (ref 4.0–10.5)
nRBC: 0.1 % (ref 0.0–0.2)

## 2022-03-15 LAB — BASIC METABOLIC PANEL
Anion gap: 11 (ref 5–15)
BUN: <5 mg/dL — ABNORMAL LOW (ref 6–20)
CO2: 23 mmol/L (ref 22–32)
Calcium: 7.9 mg/dL — ABNORMAL LOW (ref 8.9–10.3)
Chloride: 103 mmol/L (ref 98–111)
Creatinine, Ser: 0.61 mg/dL (ref 0.44–1.00)
GFR, Estimated: >60 mL/min (ref >60)
Glucose, Bld: 98 mg/dL (ref 70–99)
Potassium: 3 mmol/L — ABNORMAL LOW (ref 3.5–5.1)
Sodium: 137 mmol/L (ref 135–145)

## 2022-03-15 MED ORDER — POTASSIUM CHLORIDE CRYS ER 20 MEQ PO TBCR
40.0000 meq | EXTENDED_RELEASE_TABLET | Freq: Two times a day (BID) | ORAL | Status: AC
  Administered 2022-03-15 – 2022-03-17 (×6): 40 meq via ORAL
  Filled 2022-03-15 (×6): qty 2

## 2022-03-15 NOTE — Progress Notes (Signed)
Faculty Practice OB/GYN Attending Note  Subjective:  Patient reported much less pain that previously. No other complaints .    Objective:   Patient Vitals for the past 24 hrs:  BP Temp Temp src Pulse Resp SpO2  03/15/22 0826 130/75 99 F (37.2 C) Oral (!) 107 18 98 %  03/15/22 0433 -- 99.7 F (37.6 C) Oral -- -- --  03/15/22 0354 130/79 (!) 100.8 F (38.2 C) Oral (!) 128 18 98 %  03/14/22 2348 131/77 98.9 F (37.2 C) Oral 98 18 98 %  03/14/22 1927 133/80 99.5 F (37.5 C) Oral (!) 117 18 99 %  03/14/22 1853 124/72 -- -- (!) 122 -- --  03/14/22 1505 (!) 142/76 99.4 F (37.4 C) Oral 96 -- 98 %  03/14/22 1139 137/69 99 F (37.2 C) Oral 66 18 98 %    I/O last 3 completed shifts: In: 3789.2 [P.O.:240; I.V.:3544.2; Other:5] Out: 4945 [Urine:4850; Drains:95]  Gen: NAD HENT: Normocephalic, atraumatic Lungs: Normal respiratory effort Heart: Regular rate noted Abdomen: soft, mild TTP, incision C/D/I,  RLQ IR drain in place with some sanguinous fluid in bag.  Cervix: Deferred Ext: 2+ DTRs, no edema, no cyanosis, negative Homan's sign  Studies:    Latest Ref Rng & Units 03/15/2022    4:28 AM 03/12/2022    4:48 AM 03/11/2022    3:46 AM  CBC  WBC 4.0 - 10.5 K/uL 16.9  16.0  20.7   Hemoglobin 12.0 - 15.0 g/dL 9.8  8.4  35.3   Hematocrit 36.0 - 46.0 % 29.3  25.3  30.0   Platelets 150 - 400 K/uL 988  717  735       Latest Ref Rng & Units 03/15/2022    4:28 AM 03/14/2022    4:27 AM 03/13/2022    4:49 AM  CMP  Glucose 70 - 99 mg/dL 98  85  73   BUN 6 - 20 mg/dL <5  <5  <5   Creatinine 0.44 - 1.00 mg/dL 6.14  4.31  5.40   Sodium 135 - 145 mmol/L 137  139  137   Potassium 3.5 - 5.1 mmol/L 3.0  3.4  3.5   Chloride 98 - 111 mmol/L 103  110  108   CO2 22 - 32 mmol/L 23  22  19    Calcium 8.9 - 10.3 mg/dL 7.9  7.8  7.8    Results for orders placed or performed during the hospital encounter of 03/09/22  Culture, blood (Routine X 2) w Reflex to ID Panel     Status: None    Collection Time: 03/09/22  4:28 PM   Specimen: BLOOD RIGHT HAND  Result Value Ref Range Status   Specimen Description BLOOD RIGHT HAND  Final   Special Requests   Final    BOTTLES DRAWN AEROBIC AND ANAEROBIC Blood Culture results may not be optimal due to an inadequate volume of blood received in culture bottles   Culture   Final    NO GROWTH 5 DAYS Performed at [facility], [address] [place], [place]    Report Status 03/14/2022 FINAL  Final  Culture, blood (Routine X 2) w Reflex to ID Panel     Status: None   Collection Time: 03/09/22  4:36 PM   Specimen: BLOOD  Result Value Ref Range Status   Specimen Description BLOOD LEFT ANTECUBITAL  Final   Special Requests   Final    BOTTLES DRAWN AEROBIC AND  ANAEROBIC Blood Culture results may not be optimal due to an inadequate volume of blood received in culture bottles   Culture   Final    NO GROWTH 5 DAYS Performed at [facility], [address], [place], [place] [address]    Report Status 03/14/2022 FINAL  Final  Culture, OB Urine     Status: None   Collection Time: 03/10/22  3:13 AM   Specimen: OB Clean Catch; Urine  Result Value Ref Range Status   Specimen Description OB CLEAN CATCH  Final   Special Requests NONE  Final   Culture   Final    NO GROWTH Performed at [facility], [address], [place], [place] [address]    Report Status 03/11/2022 FINAL  Final  Aerobic/Anaerobic Culture w Gram Stain (surgical/deep wound)     Status: None (Preliminary result)   Collection Time: 03/10/22  4:00 PM   Specimen: Wound; Abscess  Result Value Ref Range Status   Specimen Description WOUND  Final   Special Requests ABDOMINAL ABSCESS  Final   Gram Stain   Final    ABUNDANT WBC PRESENT,BOTH PMN AND MONONUCLEAR NO ORGANISMS SEEN Performed at [facility], [address], [place], [place] [address]    Culture   Final    ABUNDANT KLEBSIELLA PNEUMONIAE FEW ESCHERICHIA COLI NO ANAEROBES ISOLATED;  CULTURE IN PROGRESS FOR 5 DAYS    Report Status PENDING  Incomplete   Organism ID, Bacteria KLEBSIELLA PNEUMONIAE  Final   Organism ID, Bacteria ESCHERICHIA COLI  Final      Susceptibility   Escherichia coli - MIC*    AMPICILLIN <=2 SENSITIVE Sensitive     CEFAZOLIN <=4 SENSITIVE Sensitive     CEFEPIME <=0.12 SENSITIVE Sensitive     CEFTAZIDIME <=1 SENSITIVE Sensitive     CEFTRIAXONE <=0.25 SENSITIVE Sensitive     CIPROFLOXACIN <=0.25 SENSITIVE Sensitive     GENTAMICIN <=1 SENSITIVE Sensitive     IMIPENEM <=0.25 SENSITIVE Sensitive     TRIMETH/SULFA <=20 SENSITIVE Sensitive     AMPICILLIN/SULBACTAM <=2 SENSITIVE Sensitive     PIP/TAZO <=4 SENSITIVE Sensitive     * FEW ESCHERICHIA COLI   Klebsiella pneumoniae - MIC*    AMPICILLIN >=32 RESISTANT Resistant     CEFAZOLIN <=4 SENSITIVE Sensitive     CEFEPIME <=0.12 SENSITIVE Sensitive     CEFTAZIDIME <=1 SENSITIVE Sensitive     CEFTRIAXONE <=0.25 SENSITIVE Sensitive     CIPROFLOXACIN <=0.25 SENSITIVE Sensitive     GENTAMICIN <=1 SENSITIVE Sensitive     IMIPENEM 0.5 SENSITIVE Sensitive     TRIMETH/SULFA >=320 RESISTANT Resistant     AMPICILLIN/SULBACTAM 8 SENSITIVE Sensitive     PIP/TAZO 8 SENSITIVE Sensitive     * ABUNDANT KLEBSIELLA PNEUMONIAE    Assessment & Plan:  27 y.o. G1P1001 admitted for postoperative hemoperitoneum - abdominal abscess and endometritis. She is s/p IR drain placement on 6/12.  Has put out over 1300 ml of sanguinous drainage since then (50 ml yesterday). No signs of active bleeding. She does have acute blood loss anemia, was given Venofer on 6/14. Stable Hgb at 9.8 today.   Still having fevers, but fever curve is decreasing, last one was 0400 this morning and was 100.8. Drain cultures showed E.coli and Klebsiella, antibiotics changed to Rocephin on 6/15 given sensitivities.  Continue to monitor.   Plan is for discharge when at least 48 hours afebrile, discussed with patient. Appreciate IR recommendations,  please refer to their  note for details. Lovenox/SCDs for VTE ppx, also encouraged OOB Analgesia as needed. Continue close observation.   Jaynie Collins, MD, FACOG Obstetrician & Gynecologist, [facility] for [employer], [facility]

## 2022-03-16 LAB — BASIC METABOLIC PANEL
Anion gap: 6 (ref 5–15)
BUN: <5 mg/dL — ABNORMAL LOW (ref 6–20)
CO2: 23 mmol/L (ref 22–32)
Calcium: 7.9 mg/dL — ABNORMAL LOW (ref 8.9–10.3)
Chloride: 110 mmol/L (ref 98–111)
Creatinine, Ser: 0.62 mg/dL (ref 0.44–1.00)
GFR, Estimated: >60 mL/min (ref >60)
Glucose, Bld: 98 mg/dL (ref 70–99)
Potassium: 3.4 mmol/L — ABNORMAL LOW (ref 3.5–5.1)
Sodium: 139 mmol/L (ref 135–145)

## 2022-03-16 LAB — CBC WITH DIFFERENTIAL/PLATELET
Abs Immature Granulocytes: 0.14 10*3/uL — ABNORMAL HIGH (ref 0.00–0.07)
Basophils Absolute: 0 10*3/uL (ref 0.0–0.1)
Basophils Relative: 0 %
Eosinophils Absolute: 0.2 10*3/uL (ref 0.0–0.5)
Eosinophils Relative: 2 %
HCT: 29.8 % — ABNORMAL LOW (ref 36.0–46.0)
Hemoglobin: 9.6 g/dL — ABNORMAL LOW (ref 12.0–15.0)
Immature Granulocytes: 1 %
Lymphocytes Relative: 15 %
Lymphs Abs: 1.8 10*3/uL (ref 0.7–4.0)
MCH: 29.2 pg (ref 26.0–34.0)
MCHC: 32.2 g/dL (ref 30.0–36.0)
MCV: 90.6 fL (ref 80.0–100.0)
Monocytes Absolute: 0.9 10*3/uL (ref 0.1–1.0)
Monocytes Relative: 8 %
Neutro Abs: 8.8 10*3/uL — ABNORMAL HIGH (ref 1.7–7.7)
Neutrophils Relative %: 74 %
Platelets: 1043 10*3/uL (ref 150–400)
RBC: 3.29 MIL/uL — ABNORMAL LOW (ref 3.87–5.11)
RDW: 13.7 % (ref 11.5–15.5)
WBC: 11.8 10*3/uL — ABNORMAL HIGH (ref 4.0–10.5)
nRBC: 0.2 % (ref 0.0–0.2)

## 2022-03-16 NOTE — Progress Notes (Signed)
Faculty Practice OB/GYN Attending Note  Subjective:  Patient with no complaints. No fevers in last 24 hours.  Her mom is at bedside.    Objective:   Patient Vitals for the past 24 hrs:  BP Temp Temp src Pulse Resp SpO2  03/16/22 0350 132/77 99.9 F (37.7 C) Oral (!) 102 17 100 %  03/15/22 2243 128/77 98.7 F (37.1 C) Oral 99 17 99 %  03/15/22 1958 127/77 97.9 F (36.6 C) Oral 92 17 99 %  03/15/22 1615 -- 98.8 F (37.1 C) Oral -- -- --  03/15/22 1601 118/69 -- -- 93 18 98 %  03/15/22 1244 119/76 98.7 F (37.1 C) Oral (!) 105 18 98 %  03/15/22 0826 130/75 99 F (37.2 C) Oral (!) 107 18 98 %    Total drain output last 24 hours:  75 ml  Gen: NAD HENT: Normocephalic, atraumatic Lungs: Normal respiratory effort Heart: Regular rate noted Abdomen: soft, mild TTP, incision C/D/I,  RLQ IR drain in place with some sanguinous fluid in bag.  Cervix: Deferred Ext: 2+ DTRs, no edema, no cyanosis, negative Homan's sign  Studies:    Latest Ref Rng & Units 03/16/2022    4:36 AM 03/15/2022    4:28 AM 03/12/2022    4:48 AM  CBC  WBC 4.0 - 10.5 K/uL 11.8  16.9  16.0   Hemoglobin 12.0 - 15.0 g/dL 9.6  9.8  8.4   Hematocrit 36.0 - 46.0 % 29.8  29.3  25.3   Platelets 150 - 400 K/uL 1,043  988  717       Latest Ref Rng & Units 03/16/2022    4:36 AM 03/15/2022    4:28 AM 03/14/2022    4:27 AM  CMP  Glucose 70 - 99 mg/dL 98  98  85   BUN 6 - 20 mg/dL <5  <5  <5   Creatinine 0.44 - 1.00 mg/dL 4.17  4.08  1.44   Sodium 135 - 145 mmol/L 139  137  139   Potassium 3.5 - 5.1 mmol/L 3.4  3.0  3.4   Chloride 98 - 111 mmol/L 110  103  110   CO2 22 - 32 mmol/L 23  23  22    Calcium 8.9 - 10.3 mg/dL 7.9  7.9  7.8    Results for orders placed or performed during the hospital encounter of 03/09/22  Culture, blood (Routine X 2) w Reflex to ID Panel     Status: None   Collection Time: 03/09/22  4:28 PM   Specimen: BLOOD RIGHT HAND  Result Value Ref Range Status   Specimen Description BLOOD  RIGHT HAND  Final   Special Requests   Final    BOTTLES DRAWN AEROBIC AND ANAEROBIC Blood Culture results may not be optimal due to an inadequate volume of blood received in culture bottles   Culture   Final    NO GROWTH 5 DAYS Performed at [facility], [address], [place], [place]    Report Status 03/14/2022 FINAL  Final  Culture, blood (Routine X 2) w Reflex to ID Panel     Status: None   Collection Time: 03/09/22  4:36 PM   Specimen: BLOOD  Result Value Ref Range Status   Specimen Description BLOOD LEFT ANTECUBITAL  Final   Special Requests   Final    BOTTLES DRAWN AEROBIC AND ANAEROBIC Blood Culture results may not be optimal due to an inadequate volume of blood  received in culture bottles   Culture   Final    NO GROWTH 5 DAYS Performed at [facility], [address] [place], [place] [address]    Report Status 03/14/2022 FINAL  Final  Culture, OB Urine     Status: None   Collection Time: 03/10/22  3:13 AM   Specimen: OB Clean Catch; Urine  Result Value Ref Range Status   Specimen Description OB CLEAN CATCH  Final   Special Requests NONE  Final   Culture   Final    NO GROWTH Performed at [facility], [address] [place], [place] [address]    Report Status 03/11/2022 FINAL  Final  Aerobic/Anaerobic Culture w Gram Stain (surgical/deep wound)     Status: None   Collection Time: 03/10/22  4:00 PM   Specimen: Wound; Abscess  Result Value Ref Range Status   Specimen Description WOUND  Final   Special Requests ABDOMINAL ABSCESS  Final   Gram Stain   Final    ABUNDANT WBC PRESENT,BOTH PMN AND MONONUCLEAR NO ORGANISMS SEEN    Culture   Final    ABUNDANT KLEBSIELLA PNEUMONIAE FEW ESCHERICHIA COLI NO ANAEROBES ISOLATED Performed at [facility], [address], [place], [place] [address]    Report Status 03/15/2022 FINAL  Final   Organism ID, Bacteria KLEBSIELLA PNEUMONIAE  Final   Organism ID, Bacteria ESCHERICHIA COLI  Final       Susceptibility   Escherichia coli - MIC*    AMPICILLIN <=2 SENSITIVE Sensitive     CEFAZOLIN <=4 SENSITIVE Sensitive     CEFEPIME <=0.12 SENSITIVE Sensitive     CEFTAZIDIME <=1 SENSITIVE Sensitive     CEFTRIAXONE <=0.25 SENSITIVE Sensitive     CIPROFLOXACIN <=0.25 SENSITIVE Sensitive     GENTAMICIN <=1 SENSITIVE Sensitive     IMIPENEM <=0.25 SENSITIVE Sensitive     TRIMETH/SULFA <=20 SENSITIVE Sensitive     AMPICILLIN/SULBACTAM <=2 SENSITIVE Sensitive     PIP/TAZO <=4 SENSITIVE Sensitive     * FEW ESCHERICHIA COLI   Klebsiella pneumoniae - MIC*    AMPICILLIN >=32 RESISTANT Resistant     CEFAZOLIN <=4 SENSITIVE Sensitive     CEFEPIME <=0.12 SENSITIVE Sensitive     CEFTAZIDIME <=1 SENSITIVE Sensitive     CEFTRIAXONE <=0.25 SENSITIVE Sensitive     CIPROFLOXACIN <=0.25 SENSITIVE Sensitive     GENTAMICIN <=1 SENSITIVE Sensitive     IMIPENEM 0.5 SENSITIVE Sensitive     TRIMETH/SULFA >=320 RESISTANT Resistant     AMPICILLIN/SULBACTAM 8 SENSITIVE Sensitive     PIP/TAZO 8 SENSITIVE Sensitive     * ABUNDANT KLEBSIELLA PNEUMONIAE    Assessment & Plan:  26 y.o. G1P1001 admitted  on 6/11 for infected postoperative hemoperitoneum/abdominal abscess. She is s/p IR drain placement on 6/12.  Has put out over 1300 ml of sanguinous drainage since then (50 ml yesterday). No signs of active bleeding. She does have acute blood loss anemia, was given Venofer on 6/14. Stable Hgb at 9.6 today.   Elevated platelets are most likely due to reactive thrombocytosis due to infection, will continue to monitor.  If this gets any much higher than over >1 million /ul, may need further evaluation. No fever in last 24 hours, last one was 0400 6/17 and was 100.8. Drain cultures showed E.coli and Klebsiella, antibiotics changed to Rocephin on 6/15 given sensitivities.  Continue to monitor.   Plan is for discharge when at least 48-72 hours afebrile, discussed with patient. Will  discharge with at least seven more days of  antibiotics (third generation preferably) Appreciate IR recommendations, please refer to their note for details. Lovenox/SCDs for VTE ppx, also encouraged OOB Analgesia as needed. Continue close observation.   Jaynie Collins, MD, FACOG Obstetrician & Gynecologist, [facility] for [employer], [facility]

## 2022-03-17 ENCOUNTER — Encounter (HOSPITAL_COMMUNITY): Payer: Self-pay | Admitting: Obstetrics and Gynecology

## 2022-03-17 ENCOUNTER — Ambulatory Visit (INDEPENDENT_AMBULATORY_CARE_PROVIDER_SITE_OTHER): Payer: Medicaid Other | Admitting: Clinical

## 2022-03-17 DIAGNOSIS — F4323 Adjustment disorder with mixed anxiety and depressed mood: Secondary | ICD-10-CM

## 2022-03-17 LAB — CBC WITH DIFFERENTIAL/PLATELET
Abs Immature Granulocytes: 0.11 10*3/uL — ABNORMAL HIGH (ref 0.00–0.07)
Basophils Absolute: 0 10*3/uL (ref 0.0–0.1)
Basophils Relative: 0 %
Eosinophils Absolute: 0.1 10*3/uL (ref 0.0–0.5)
Eosinophils Relative: 1 %
HCT: 29.6 % — ABNORMAL LOW (ref 36.0–46.0)
Hemoglobin: 10 g/dL — ABNORMAL LOW (ref 12.0–15.0)
Immature Granulocytes: 1 %
Lymphocytes Relative: 17 %
Lymphs Abs: 2 10*3/uL (ref 0.7–4.0)
MCH: 30.1 pg (ref 26.0–34.0)
MCHC: 33.8 g/dL (ref 30.0–36.0)
MCV: 89.2 fL (ref 80.0–100.0)
Monocytes Absolute: 0.8 10*3/uL (ref 0.1–1.0)
Monocytes Relative: 7 %
Neutro Abs: 9.2 10*3/uL — ABNORMAL HIGH (ref 1.7–7.7)
Neutrophils Relative %: 74 %
Platelets: 1035 10*3/uL (ref 150–400)
RBC: 3.32 MIL/uL — ABNORMAL LOW (ref 3.87–5.11)
RDW: 13.8 % (ref 11.5–15.5)
WBC: 12.2 10*3/uL — ABNORMAL HIGH (ref 4.0–10.5)
nRBC: 0 % (ref 0.0–0.2)

## 2022-03-17 LAB — BASIC METABOLIC PANEL
Anion gap: 10 (ref 5–15)
BUN: <5 mg/dL — ABNORMAL LOW (ref 6–20)
CO2: 22 mmol/L (ref 22–32)
Calcium: 8.6 mg/dL — ABNORMAL LOW (ref 8.9–10.3)
Chloride: 106 mmol/L (ref 98–111)
Creatinine, Ser: 0.56 mg/dL (ref 0.44–1.00)
GFR, Estimated: >60 mL/min (ref >60)
Glucose, Bld: 100 mg/dL — ABNORMAL HIGH (ref 70–99)
Potassium: 3.9 mmol/L (ref 3.5–5.1)
Sodium: 138 mmol/L (ref 135–145)

## 2022-03-17 MED ORDER — AMOXICILLIN-POT CLAVULANATE 875-125 MG PO TABS
1.0000 | ORAL_TABLET | Freq: Two times a day (BID) | ORAL | Status: DC
  Administered 2022-03-17 – 2022-03-18 (×2): 1 via ORAL
  Filled 2022-03-17 (×2): qty 1

## 2022-03-17 NOTE — Progress Notes (Signed)
Referring Physician(s): Dr. Macon Large    Supervising Physician: Ruel Favors  Patient Status:  [facility] - In-pt  Chief Complaint: Caesarean section with post-op pelvic fluid collection s/p percutaneous drain placement by IR 6/12  Subjective: Patient in bed with her baby sleeping in her lap. She seems sleepy but is conversational. She denies pain/discomfort. States the drain is working fine.   Allergies: Patient has no known allergies.  Medications: Prior to Admission medications   Medication Sig Start Date End Date Taking? Authorizing Provider  acetaminophen (TYLENOL) 500 MG tablet Take 2 tablets (1,000 mg total) by mouth every 8 (eight) hours as needed (pain). 03/05/22   Worthy Rancher, MD  Elastic Bandages & Supports (COMFORT FIT MATERNITY SUPP MED) MISC 1 Units by Does not apply route daily. Patient not taking: Reported on 12/16/2021 10/09/21   Federico Flake, MD  ferrous sulfate (FERROUSUL) 325 (65 FE) MG tablet Take 1 tablet (325 mg total) by mouth every other day. 09/26/21   Federico Flake, MD  furosemide (LASIX) 20 MG tablet Take 1 tablet (20 mg total) by mouth daily for 1 day. 03/05/22 03/06/22  Worthy Rancher, MD  ibuprofen (ADVIL) 600 MG tablet Take 1 tablet (600 mg total) by mouth every 6 (six) hours as needed (pain). 03/05/22   Worthy Rancher, MD  NIFEdipine (ADALAT CC) 30 MG 24 hr tablet Take 1 tablet (30 mg total) by mouth daily. 03/05/22   Worthy Rancher, MD  oxyCODONE (OXY IR/ROXICODONE) 5 MG immediate release tablet Take 1 tablet (5 mg total) by mouth every 6 (six) hours as needed for severe pain or breakthrough pain. 03/06/22   Federico Flake, MD  Prenatal Vit-Fe Fumarate-FA (PREPLUS) 27-1 MG TABS Take 1 tablet by mouth daily.    [provider]     Vital Signs: BP 128/89 (BP Location: Left Arm)   Pulse 98   Temp 98.9 F (37.2 C) (Oral)   Resp 18   Ht 5\' 2"  (1.575 m)   Wt 162 lb 11.2 oz (73.8 kg)   LMP 05/27/2021   SpO2  97%   Breastfeeding Yes   BMI 29.76 kg/m   Physical Exam Constitutional:      General: She is not in acute distress.    Appearance: She is not ill-appearing.  Pulmonary:     Effort: Pulmonary effort is normal.  Abdominal:     Comments: Drain not viewed due to baby sleeping in her lap. She states it is working fine, the nurses have been flushing the drain and that there is currently some fluid in the bag.   Neurological:     Mental Status: She is alert and oriented to person, place, and time.     Imaging: No results found.  Labs:  CBC: Recent Labs    03/12/22 0448 03/15/22 0428 03/16/22 0436 03/17/22 0532  WBC 16.0* 16.9* 11.8* 12.2*  HGB 8.4* 9.8* 9.6* 10.0*  HCT 25.3* 29.3* 29.8* 29.6*  PLT 717* 988* 1,043* 1,035*    COAGS: Recent Labs    03/09/22 1741  INR 1.4*  APTT 37*    BMP: Recent Labs    03/14/22 0427 03/15/22 0428 03/16/22 0436 03/17/22 0532  NA 139 137 139 138  K 3.4* 3.0* 3.4* 3.9  CL 110 103 110 106  CO2 22 23 23 22   GLUCOSE 85 98 98 100*  BUN <5* <5* <5* <5*  CALCIUM 7.8* 7.9* 7.9* 8.6*  CREATININE 0.85 0.61 0.62 0.56  GFRNONAA >  60 >60 >60 >60    LIVER FUNCTION TESTS: Recent Labs    03/05/22 0511 03/09/22 1628 03/10/22 0440 03/11/22 0346  BILITOT 0.6 0.8 0.8 0.7  AST 18 13* 14* 16  ALT 12 9 8 9   ALKPHOS 84 79 71 62  PROT 4.9* 6.2* 5.7* 5.4*  ALBUMIN 2.0* 2.0* 1.7* 1.6*    Assessment and Plan:  Caesarean section with post-op pelvic fluid collection s/p percutaneous drain placement by IR 6/12. She is afebrile. 40 ml output documented in Epic in the past 24 hours.   Drain Location: Pelvis Size: Fr size: 12 Fr Date of placement: 03/10/22 Currently to: Drain collection device: gravity 24 hour output:  Output by Drain (mL) 03/15/22 0701 - 03/15/22 1900 03/15/22 1901 - 03/16/22 0700 03/16/22 0701 - 03/16/22 1900 03/16/22 1901 - 03/17/22 0700 03/17/22 0701 - 03/17/22 1325  Closed System Drain 1 Right RLQ 12 Fr. 55 20 30 10 30      Interval imaging/drain manipulation:  None  Current examination: Not viewed due to sleeping baby.    Plan: Continue TID flushes with 5 cc NS. Record output Q shift. Dressing changes QD or PRN if soiled.  Call IR APP or on call IR MD if difficulty flushing or sudden change in drain output.  Repeat imaging/possible drain injection once output < 10 mL/QD (excluding flush material.)  Discharge planning: Patient tentatively scheduled for discharge home 03/18/22. Outpatient orders have been placed. A scheduler from our office will call the patient with a date/time of her appointment in approximately 10-14 days. I will send a message to the nurse and request that drain care teaching be performed prior to discharge. The patient knows to flush the drain at least once daily, change the dressing daily or as needed, and to keep the site clean and dry. She knows to call our office if drain output is less than 10 ml per day and/or she develops worsening pain, fevers, chills, etc. She was given enough NS flushes to last until her appointment in IR.   IR will continue to follow - please call with questions or concerns.  Electronically Signed: , AGACNP-BC [phone] 03/17/2022, 1:25 PM   I spent a total of 15 Minutes at the the patient's bedside AND on the patient's hospital floor or unit, greater than 50% of which was counseling/coordinating care for pelvic fluid collection drain.

## 2022-03-17 NOTE — Progress Notes (Signed)
Melissa Guzman is a 26 y.o. female patient.  1. Postpartum septic endometritis   2. Supervision of low-risk pregnancy, first trimester   3. Iron deficiency anemia, unspecified iron deficiency anemia type    Past Medical History:  Diagnosis Date   Anemia    Past Surgical History Pertinent Negatives:  Procedure Date Noted   NO PAST SURGERIES 02/27/2022   Scheduled Meds:  amoxicillin-clavulanate  1 tablet Oral Q12H   enoxaparin (LOVENOX) injection  40 mg Subcutaneous Q24H   NIFEdipine  30 mg Oral Daily   polyethylene glycol  17 g Oral Daily   potassium chloride  40 mEq Oral BID   Tdap  0.5 mL Intramuscular Once   Continuous Infusions:  sodium chloride 10 mL/hr at 03/10/22 1647   sodium chloride 75 mL/hr at 03/16/22 2213   lactated ringers     And   lactated ringers     PRN Meds:sodium chloride, acetaminophen, coconut oil, witch hazel-glycerin **AND** dibucaine, diphenhydrAMINE, ibuprofen, menthol-cetylpyridinium, ondansetron (ZOFRAN) IV, oxyCODONE, simethicone  No Known Allergies Principal Problem:   Postpartum septic endometritis  Blood pressure 119/74, pulse (!) 102, temperature 99.2 F (37.3 C), temperature source Oral, resp. rate 18, height 5\' 2"  (1.575 m), weight 73.8 kg, last menstrual period 05/27/2021, SpO2 97 %, currently breastfeeding.  Vitals:   03/16/22 2004 03/16/22 2220 03/17/22 0400 03/17/22 0736  BP: 125/75 133/78 119/74   Pulse: 81 87 (!) 102   Resp: 19 19 18 18   Temp: 99 F (37.2 C) 98.8 F (37.1 C) 98.8 F (37.1 C) 99.2 F (37.3 C)  TempSrc: Oral Oral Oral Oral  SpO2: 100% 100% 100% 97%  Weight:      Height:          Subjective: Patient reports feels much better, improving daily.    Objective: I have reviewed patient's vital signs, intake and output, medications, labs, microbiology, pathology, and radiology results.  General: alert, cooperative, and no distress GI: soft, non-tender; bowel sounds normal; no masses,  no organomegaly Drain RLQ  minimal output over 24 hours Assessment: 26 y.o. G1P1001 admitted  on 6/11 for infected postoperative hemoperitoneum/abdominal abscess. She is s/p IR drain placement on 6/12.  Has put out over 1300 ml of sanguinous drainage since then (50 ml yesterday). No signs of active bleeding. She does have acute blood loss anemia, was given Venofer on 6/14. Stable Hgb at 9.6 today.   Elevated platelets are most likely due to reactive thrombocytosis due to infection, will continue to monitor.  If this gets any much higher than over >1 million /ul, may need further evaluation. No fever in last 24 hours, last one was 0400 6/17 and was 100.8. Drain cultures showed E.coli and Klebsiella, antibiotics changed to Rocephin on 6/15 given sensitivities.  Continue to monitor.   Plan is for discharge when at least 48-72 hours afebrile, discussed with patient. Will discharge with at least seven more days of antibiotics (third generation preferably) Appreciate IR recommendations, please refer to their note for details. Lovenox/SCDs for VTE ppx, also encouraged OOB Analgesia as needed. Continue close observation.  Plan: Switched to augmentin today, both sensitive Leave drain in per IR management 10-14 days post discharge per note Anticipate discharge tomorrow   LOS: 8 days    7/17 03/17/2022, 7:51 AM

## 2022-03-18 ENCOUNTER — Encounter (HOSPITAL_COMMUNITY): Payer: Self-pay | Admitting: Obstetrics and Gynecology

## 2022-03-18 ENCOUNTER — Other Ambulatory Visit (HOSPITAL_COMMUNITY): Payer: Self-pay

## 2022-03-18 DIAGNOSIS — D75838 Other thrombocytosis: Secondary | ICD-10-CM | POA: Diagnosis not present

## 2022-03-18 LAB — CBC WITH DIFFERENTIAL/PLATELET
Abs Immature Granulocytes: 0.07 10*3/uL (ref 0.00–0.07)
Basophils Absolute: 0.1 10*3/uL (ref 0.0–0.1)
Basophils Relative: 0 %
Eosinophils Absolute: 0.1 10*3/uL (ref 0.0–0.5)
Eosinophils Relative: 1 %
HCT: 29.8 % — ABNORMAL LOW (ref 36.0–46.0)
Hemoglobin: 9.5 g/dL — ABNORMAL LOW (ref 12.0–15.0)
Immature Granulocytes: 1 %
Lymphocytes Relative: 19 %
Lymphs Abs: 2.2 10*3/uL (ref 0.7–4.0)
MCH: 29 pg (ref 26.0–34.0)
MCHC: 31.9 g/dL (ref 30.0–36.0)
MCV: 90.9 fL (ref 80.0–100.0)
Monocytes Absolute: 0.9 10*3/uL (ref 0.1–1.0)
Monocytes Relative: 8 %
Neutro Abs: 8.3 10*3/uL — ABNORMAL HIGH (ref 1.7–7.7)
Neutrophils Relative %: 71 %
Platelets: 1064 10*3/uL (ref 150–400)
RBC: 3.28 MIL/uL — ABNORMAL LOW (ref 3.87–5.11)
RDW: 14 % (ref 11.5–15.5)
WBC: 11.5 10*3/uL — ABNORMAL HIGH (ref 4.0–10.5)
nRBC: 0 % (ref 0.0–0.2)

## 2022-03-18 LAB — BASIC METABOLIC PANEL
Anion gap: 7 (ref 5–15)
BUN: <5 mg/dL — ABNORMAL LOW (ref 6–20)
CO2: 25 mmol/L (ref 22–32)
Calcium: 8.6 mg/dL — ABNORMAL LOW (ref 8.9–10.3)
Chloride: 106 mmol/L (ref 98–111)
Creatinine, Ser: 0.63 mg/dL (ref 0.44–1.00)
GFR, Estimated: >60 mL/min (ref >60)
Glucose, Bld: 90 mg/dL (ref 70–99)
Potassium: 4.2 mmol/L (ref 3.5–5.1)
Sodium: 138 mmol/L (ref 135–145)

## 2022-03-18 MED ORDER — FERROUS SULFATE 325 (65 FE) MG PO TABS
325.0000 mg | ORAL_TABLET | Freq: Every day | ORAL | 0 refills | Status: DC
  Filled 2022-03-18: qty 30, 30d supply, fill #0

## 2022-03-18 MED ORDER — AMOXICILLIN-POT CLAVULANATE 875-125 MG PO TABS
1.0000 | ORAL_TABLET | Freq: Two times a day (BID) | ORAL | 0 refills | Status: AC
  Filled 2022-03-18: qty 26, 13d supply, fill #0

## 2022-03-18 MED ORDER — OXYCODONE HCL 5 MG PO TABS
5.0000 mg | ORAL_TABLET | ORAL | 0 refills | Status: DC | PRN
  Filled 2022-03-18: qty 10, 2d supply, fill #0

## 2022-03-18 MED ORDER — IBUPROFEN 800 MG PO TABS
800.0000 mg | ORAL_TABLET | Freq: Three times a day (TID) | ORAL | 0 refills | Status: DC | PRN
Start: 2022-03-18 — End: 2022-07-03
  Filled 2022-03-18: qty 30, 10d supply, fill #0

## 2022-03-18 NOTE — Discharge Summary (Signed)
Obstetrics and Gynecology Physician Discharge Summary  Patient ID: Melissa Guzman MRN: [id number] DOB/AGE: October 03, 1995 26 y.o.  Admit Date: 03/09/2022 Discharge Date: 03/18/2022  Admission Diagnoses:  Postpartum septic endometritis [O85]   Procedures: IR placement of drain 03/10/22  Hospital Course:  Melissa Guzman is a 26 y.o. G1P1001 admitted for concern for PP endometritis in the setting of SIRS.  Code sepsis initiated and she was started on Zosyn.  She was made NPO for possible IR drainage if collection noted.  It was found on CT that she had a large pelvic hematoma/hemoperitoneum and IR placed a drain on 6/12. She underwent the procedures as mentioned above, her operation was uncomplicated. For further details about surgery, please refer to the operative report. She had copious drainage initially and then it slowed to about 40-60cc a day.   Cultures were collected and ultimately positive for E. Coli and Klebsiella. While awaiting cultures, she was placed on Vancomycin as well and during this time she became afebrile and her WBC improved. She also improved clinically during this time. Once her cultures came back, she was changed to Rocephin which was on 6/15. Once she was afebrile for 48 hours she was transitioned to Augment which the bacteria were both sensitive to. She remained afebrile on this and was ready for discharge.   She was also noted to be persistently anemic and she was given IV venofer on 6/14.   She was noted to have elevated platelets and I discussed this with Dr. Alvy Bimler who reviewed the chart remotely. She felt due to the blood loss, the procedures and the infection, this was most likely reactive. She recommended the patient continue Iron at home and recheck the value in one month.   She was deemed stable for discharge to home.   Significant Labs:    Latest Ref Rng & Units 03/18/2022    5:02 AM 03/17/2022    5:32 AM 03/16/2022    4:36 AM  CBC  WBC 4.0 - 10.5 K/uL 11.5  12.2  11.8    Hemoglobin 12.0 - 15.0 g/dL 9.5  10.0  9.6   Hematocrit 36.0 - 46.0 % 29.8  29.6  29.8   Platelets 150 - 400 K/uL 1,064  1,035  1,043     Discharge Exam: Blood pressure 102/68, pulse (!) 106, temperature 98.8 F (37.1 C), temperature source Oral, resp. rate 18, height 5\' 2"  (1.575 m), weight 73.8 kg, last menstrual period 05/27/2021, SpO2 99 %, currently breastfeeding. General appearance: alert and no distress  Resp: clear to auscultation bilaterally  Cardio: regular rate and rhythm  GI: soft, non-tender; bowel sounds normal; no masses, no organomegaly.  Incision: C/D/I, no erythema, no drainage noted Extremities: extremities normal, atraumatic, no cyanosis or edema and Homans sign is negative, no sign of DVT  Drain: sanguinous drainage to gravity Output by Drain (mL) 03/16/22 0701 - 03/16/22 1900 03/16/22 1901 - 03/17/22 0700 03/17/22 0701 - 03/17/22 1900 03/17/22 1901 - 03/18/22 0700 03/18/22 0701 - 03/18/22 1013  Closed System Drain 1 Right RLQ 12 Fr. 30 10 30 20       Discharged Condition: Stable  Disposition: Discharge disposition: 01-Home or Self Care       Discharge Instructions     Call MD for:  difficulty breathing, headache or visual disturbances   Complete by: As directed    Call MD for:  persistant nausea and vomiting   Complete by: As directed    Call MD for:  redness, tenderness, or signs of  infection (pain, swelling, redness, odor or green/yellow discharge around incision site)   Complete by: As directed    Call MD for:  severe uncontrolled pain   Complete by: As directed    Call MD for:  temperature >100.4   Complete by: As directed    Diet - low sodium heart healthy   Complete by: As directed    Discharge wound care:   Complete by: As directed    Flush drain 3 times a day with 5 milliliters of normal saline fluid.  Keep track of daily drainage from the drain to guide radiology team's decision for timing of removal.   Driving Restrictions   Complete  by: As directed    When not taking narcotic pain medication and would not hesitate to use the breaks, usually about 7 days   Increase activity slowly   Complete by: As directed    May shower / Bathe   Complete by: As directed    Sexual Activity Restrictions   Complete by: As directed    No sexual activity until cleared      Allergies as of 03/18/2022   No Known Allergies      Medication List     STOP taking these medications    Comfort Fit Maternity Supp Med Misc   furosemide 20 MG tablet Commonly known as: LASIX       TAKE these medications    Acetaminophen Extra Strength 500 MG tablet Generic drug: acetaminophen Take 2 tablets (1,000 mg total) by mouth every 8 (eight) hours as needed (pain).   amoxicillin-clavulanate 875-125 MG tablet Commonly known as: AUGMENTIN Take 1 tablet by mouth every 12 (twelve) hours for 13 days. What changed: when to take this   ferrous sulfate 325 (65 FE) MG tablet Commonly known as: FerrouSul Take 1 tablet (325 mg total) by mouth daily with breakfast. What changed: when to take this   ibuprofen 800 MG tablet Commonly known as: ADVIL Take 1 tablet (800 mg total) by mouth 3 (three) times daily with meals as needed for moderate pain, cramping, mild pain, headache or fever. What changed:  medication strength how much to take when to take this reasons to take this   NIFEdipine 30 MG 24 hr tablet Commonly known as: ADALAT CC Take 1 tablet (30 mg total) by mouth daily.   oxyCODONE 5 MG immediate release tablet Commonly known as: Oxy IR/ROXICODONE Take 1 tablet (5 mg total) by mouth every 4 (four) hours as needed for moderate pain. What changed:  when to take this reasons to take this   PrePLUS 27-1 MG Tabs Take 1 tablet by mouth daily.               Discharge Care Instructions  (From admission, onward)           Start     Ordered   03/18/22 0000  Discharge wound care:       Comments: Flush drain 3 times a  day with 5 milliliters of normal saline fluid.  Keep track of daily drainage from the drain to guide radiology team's decision for timing of removal.   03/18/22 0959           Future Appointments  Date Time Provider Department Center  03/31/2022  2:15 PM [facility] HEALTH CLINICIAN [facility]  04/02/2022  3:55 PM Venora Maples, MD [facility]    Follow-up Information     Center for [facility]  Follow up in 2 week(s).   Specialty: Obstetrics and Gynecology Contact information: [address] [place] [address] [phone]        [facility] Follow up.   Why: They will call you to make an appointment for follow up in the next week. Contact information: [address] [place] [address]              Per Radiology team: Outpatient orders have been placed. A scheduler from our office will call the patient with a date/time of her appointment in approximately 10-14 days. The patient knows to flush the drain at least once daily, change the dressing daily or as needed, and to keep the site clean and dry. She knows to call our office if drain output is less than 10 ml per day and/or she develops worsening pain, fevers, chills, etc. She was given enough NS flushes to last until her appointment in IR.   Signed:  Milas Hock, MD, FACOG Attending Obstetrician & Gynecologist Faculty Practice, [facility]

## 2022-03-18 NOTE — Progress Notes (Signed)
Drain flush for shift not provided at patient request. Patient expresses lower abdominal discomfort following sterile water flush. She states radiologist stated she needed at least one a day. She did obtain a flush during day shift along with dressing change. Has had an output of 20cc from drain over 12 hour shift. Does report occasional lower abdominal pain, resolved by tylenol and advil. Reports no further questions and/or concerns for drain care at this time.

## 2022-03-18 NOTE — BH Specialist Note (Signed)
Integrated Behavioral Health via Telemedicine Visit  03/31/2022 Melissa Guzman [id number]  Number of Integrated Behavioral Health Clinician visits: 6-Sixth Visit  Session Start time: 1417   Session End time: 1438  Total time in minutes: 21   Referring Provider: Federico Flake, MD Patient/Family location: Home [facility] Provider location: Center for [facility]  All persons participating in visit: [name] and Melissa Guzman   Types of Service: Individual psychotherapy and Telephone visit  I connected with Melissa Guzman and/or Melissa Guzman  n/a  via  Telephone or Video Enabled Telemedicine Application  (Video is Caregility application) and verified that I am speaking with the correct person using two identifiers. Discussed confidentiality: Yes   I discussed the limitations of telemedicine and the availability of in person appointments.  Discussed there is a possibility of technology failure and discussed alternative modes of communication if that failure occurs.  I discussed that engaging in this telemedicine visit, they consent to the provision of behavioral healthcare and the services will be billed under their insurance.  Patient and/or legal guardian expressed understanding and consented to Telemedicine visit: Yes   Presenting Concerns: Patient and/or family reports the following symptoms/concerns: Adjusting to complications post-cesarean; tube in stomach comes out in two weeks; attributes anxiety to these complications; good support at home Duration of problem: Postpartum; Severity of problem: moderate  Patient and/or Family's Strengths/Protective Factors: Social connections, Concrete supports in place (healthy food, safe environments, etc.), and Sense of purpose  Goals Addressed: Patient will:  Reduce symptoms of: anxiety    Demonstrate ability to: Increase healthy adjustment to current life circumstances  Progress towards  Goals: Ongoing  Interventions: Interventions utilized:  Supportive Reflection Standardized Assessments completed: GAD-7 and PHQ 9  Patient and/or Family Response: Patient agrees with treatment plan.   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   Patient may benefit from continued therapeutic interventions.  Plan: Follow up with behavioral health clinician on : Two weeks Behavioral recommendations:  -Continue prioritizing healthy self-care daily for the next two weeks with an emphasis on kindness to self during this transition time of healing Referral(s): Integrated [employer] (In Clinic)  I discussed the assessment and treatment plan with the patient and/or parent/guardian. They were provided an opportunity to ask questions and all were answered. They agreed with the plan and demonstrated an understanding of the instructions.   They were advised to call back or seek an in-person evaluation if the symptoms worsen or if the condition fails to improve as anticipated.  Rae Lips, LCSW     03/31/2022    2:23 PM 02/26/2022    3:33 PM 02/10/2022    4:31 PM 12/30/2021   11:41 AM 12/16/2021    8:50 AM  Depression screen PHQ 2/9  Decreased Interest 1 3 1 1  0  Down, Depressed, Hopeless 1 2 0 1 0  PHQ - 2 Score 2 5 1 2  0  Altered sleeping 0 1 0 1 0  Tired, decreased energy 0 3 1 1 1   Change in appetite 2 1 3  0 0  Feeling bad or failure about yourself  0 1 0 1 0  Trouble concentrating 0 1 0 1 0  Moving slowly or fidgety/restless 0 0 0 0 0  Suicidal thoughts 0 0 0 0 0  PHQ-9 Score 4 12 5 6 1       03/31/2022    2:27 PM 02/26/2022  3:33 PM 02/10/2022    4:35 PM 12/30/2021   11:42 AM  GAD 7 : Generalized Anxiety Score  Nervous, Anxious, on Edge 3 2 1 2   Control/stop worrying 0 2 1 2   Worry too much - different things 2 2 1 2   Trouble relaxing 1 2 0 2  Restless 0 1 0 0  Easily annoyed or irritable 1 2 1 2    Afraid - awful might happen 1 2 0 2  Total GAD 7 Score 8 13 4  12

## 2022-03-20 ENCOUNTER — Other Ambulatory Visit: Payer: Self-pay | Admitting: Obstetrics and Gynecology

## 2022-03-20 DIAGNOSIS — K651 Peritoneal abscess: Secondary | ICD-10-CM

## 2022-03-20 DIAGNOSIS — Z98891 History of uterine scar from previous surgery: Secondary | ICD-10-CM

## 2022-03-28 ENCOUNTER — Ambulatory Visit
Admission: RE | Admit: 2022-03-28 | Discharge: 2022-03-28 | Disposition: A | Discharge status: Home / Self Care | Payer: Medicaid Other | Source: Ambulatory Visit | Attending: Student | Admitting: Student

## 2022-03-28 ENCOUNTER — Ambulatory Visit
Admission: RE | Admit: 2022-03-28 | Discharge: 2022-03-28 | Disposition: A | Discharge status: Home / Self Care | Payer: Medicaid Other | Source: Ambulatory Visit | Attending: Obstetrics and Gynecology | Admitting: Obstetrics and Gynecology

## 2022-03-28 DIAGNOSIS — Z98891 History of uterine scar from previous surgery: Secondary | ICD-10-CM

## 2022-03-28 DIAGNOSIS — K651 Peritoneal abscess: Secondary | ICD-10-CM

## 2022-03-28 HISTORY — PX: IR RADIOLOGIST EVAL & MGMT: IMG5224

## 2022-03-28 MED ORDER — IOPAMIDOL (ISOVUE-300) INJECTION 61%
100.0000 mL | Freq: Once | INTRAVENOUS | Status: AC | PRN
  Administered 2022-03-28: 100 mL via INTRAVENOUS

## 2022-03-28 NOTE — Progress Notes (Signed)
Patient ID: Melissa Guzman, female   DOB: Apr 03, 1996, 26 y.o.   MRN: [id number]       Chief Complaint:  Postop abscess  Referring Physician(s): Anyanwu  History of Present Illness: Melissa Guzman is a 26 y.o. female with a postop lower abdominal pelvic abscess following C-section.  Patient had a percutaneous drain placed by IR 03/10/2022.  She was discharged 03/18/2022 on oral antibiotics and a drain to gravity bag.  She returns for outpatient follow-up with CT imaging today.  Overall she has been doing very well at home.  No interval fevers.  She remains on oral antibiotics.  She has been compliant with twice daily flushing.  Daily output is approximately 40 cc bloody fluid.  In review, the cultures were positive for Klebsiella and E. coli.  Imaging findings are compatible with infected hematoma/abscess.  CT today demonstrates significant reduction in the anterior abdominal pelvic fluid collection, greatest dimension 6 cm compared to 20 cm previously.  Drain catheter in stable position.  Incidental small pelvic 2.5 cm collection noted, slightly smaller.  No new drainable abdominal pelvic collections.  Improving postoperative changes throughout the lower abdomen and pelvis from the C-section.  Past Medical History:  Diagnosis Date   Anemia     Past Surgical History:  Procedure Laterality Date   CESAREAN SECTION N/A 03/01/2022   Procedure: CESAREAN SECTION;  Surgeon: Hermina Staggers, MD;  Location: [facility] LD [facility];  Service: Obstetrics;  Laterality: N/A;   IR RADIOLOGIST EVAL & MGMT  03/28/2022    Allergies: Patient has no known allergies.  Medications: Prior to Admission medications   Medication Sig Start Date End Date Taking? Authorizing Provider  acetaminophen (TYLENOL) 500 MG tablet Take 2 tablets (1,000 mg total) by mouth every 8 (eight) hours as needed (pain). 03/05/22   Worthy Rancher, MD  amoxicillin-clavulanate (AUGMENTIN) 875-125 MG tablet Take 1 tablet by mouth every 12 (twelve)  hours for 13 days. 03/18/22 03/31/22  Milas Hock, MD  ferrous sulfate (FERROUSUL) 325 (65 FE) MG tablet Take 1 tablet (325 mg total) by mouth daily with breakfast. 03/18/22   Milas Hock, MD  ibuprofen (ADVIL) 800 MG tablet Take 1 tablet (800 mg total) by mouth 3 (three) times daily with meals as needed for moderate pain, cramping, mild pain, headache or fever. 03/18/22   Milas Hock, MD  NIFEdipine (ADALAT CC) 30 MG 24 hr tablet Take 1 tablet (30 mg total) by mouth daily. 03/05/22   Worthy Rancher, MD  oxyCODONE (OXY IR/ROXICODONE) 5 MG immediate release tablet Take 1 tablet (5 mg total) by mouth every 4 (four) hours as needed for moderate pain. 03/18/22   Milas Hock, MD  Prenatal Vit-Fe Fumarate-FA (PREPLUS) 27-1 MG TABS Take 1 tablet by mouth daily.    [provider]     No family history on file.  Social History   Socioeconomic History   Marital status: Single    Spouse name: Not on file   Number of children: Not on file   Years of education: Not on file   Highest education level: Not on file  Occupational History   Not on file  Tobacco Use   Smoking status: Never   Smokeless tobacco: Never  Vaping Use   Vaping Use: Never used  Substance and Sexual Activity   Alcohol use: Not Currently   Drug use: No   Sexual activity: Yes    Birth control/protection: None  Other Topics Concern   Not on file  Social History  Narrative   Not on file   Social Determinants of Health   Financial Resource Strain: Not on file  Food Insecurity: No Food Insecurity (02/26/2022)   Hunger Vital Sign    Worried About Running Out of Food in the Last Year: Never true    Ran Out of Food in the Last Year: Never true  Transportation Needs: No Transportation Needs (12/30/2021)   PRAPARE - Administrator, Civil ServiceTransportation    Lack of Transportation (Medical): No    Lack of Transportation (Non-Medical): No  Physical Activity: Not on file  Stress: Not on file  Social Connections: Not on file    Review of  Systems: A 12 point ROS discussed and pertinent positives are indicated in the HPI above.  All other systems are negative.  Review of Systems  Vital Signs: BP 120/89   Pulse 91   Temp 98.2 F (36.8 C)   LMP 05/27/2021   SpO2 98%     Physical Exam Constitutional:      General: She is not in acute distress.    Appearance: She is normal weight. She is not toxic-appearing.  Eyes:     General: No scleral icterus.    Conjunctiva/sclera: Conjunctivae normal.  Abdominal:     Comments: Right lower quadrant drain site is clean, dry and intact.  Thin bloody fluid within the gravity bag.     Imaging: CT ABDOMEN PELVIS W CONTRAST  Result Date: 03/28/2022 CLINICAL DATA:  Postop lower abdominopelvic abscess status post percutaneous drain, following C-section culture positive for Klebsiella and E coli. EXAM: CT ABDOMEN AND PELVIS WITH CONTRAST TECHNIQUE: Multidetector CT imaging of the abdomen and pelvis was performed using the standard protocol following bolus administration of intravenous contrast. RADIATION DOSE REDUCTION: This exam was performed according to the departmental dose-optimization program which includes automated exposure control, adjustment of the mA and/or kV according to patient size and/or use of iterative reconstruction technique. CONTRAST:  100mL ISOVUE-300 IOPAMIDOL (ISOVUE-300) INJECTION 61% COMPARISON:  03/09/2022 FINDINGS: Lower chest: No acute abnormality. Hepatobiliary: Focal fatty infiltration of the liver along the falciform ligament anteriorly. No other hepatic abnormality. Gallbladder nondistended. Common bile duct nondilated. Pancreas: Unremarkable. No pancreatic ductal dilatation or surrounding inflammatory changes. Spleen: Normal in size without focal abnormality. Adrenals/Urinary Tract: Adrenal glands are unremarkable. Kidneys are normal, without renal calculi, focal lesion, or hydronephrosis. Bladder is unremarkable. Stomach/Bowel: Negative for bowel obstruction,  significant dilatation, ileus, or free air. Appendix not visualized. Anterior lower abdominal peripherally enhancing residual fluid collection measures 6.0 x 5.6 x 5.2 cm. This has decreased in size previously measuring up to 20 x 9 x 15 cm. Stable drain catheter position within the residual collection. Small cul-de-sac fluid collection measures 2.5 cm, image 65/2. No significant new abdominopelvic collections. Vascular/Lymphatic: No significant vascular findings are present. No enlarged abdominal or pelvic lymph nodes. Reproductive: Postpartum changes of the uterus. C-section defect noted. Uterus is smaller when compared to 03/09/2022. Other: No abdominal wall hernia. Musculoskeletal: No acute osseous finding.  L5 pars defects noted IMPRESSION: Significantly smaller anterior lower abdominopelvic fluid collection following percutaneous drain. Stable drain catheter position. Residual small 2.5 cm cul-de-sac collection noted. No new abdominopelvic collections. Improving postoperative changes from recent C-section. Electronically Signed   By: Judie PetitM.  Kace Hartje M.D.   On: 03/28/2022 13:20   IR Radiologist Eval & Mgmt  Result Date: 03/28/2022 Please refer to notes tab for details about interventional procedure. (Op Note)  CT IMAGE GUIDED DRAINAGE BY PERCUTANEOUS CATHETER  Result Date: 03/11/2022 INDICATION: Postoperative collection EXAM:  CT-guided drain placement into the abdominal fluid collection TECHNIQUE: Multidetector CT imaging of the abdomen and pelvis was performed following the standard protocol without IV contrast. RADIATION DOSE REDUCTION: This exam was performed according to the departmental dose-optimization program which includes automated exposure control, adjustment of the mA and/or kV according to patient size and/or use of iterative reconstruction technique. MEDICATIONS: The patient is currently admitted to the hospital and receiving intravenous antibiotics. The antibiotics were administered within an  appropriate time frame prior to the initiation of the procedure. ANESTHESIA/SEDATION: Moderate (conscious) sedation was employed during this procedure. A total of Versed 3 mg and Fentanyl 150 mcg was administered intravenously by the radiology nurse. Total intra-service moderate Sedation Time: 19 minutes. The patient's level of consciousness and vital signs were monitored continuously by radiology nursing throughout the procedure under my direct supervision. COMPLICATIONS: None immediate. PROCEDURE: Informed written consent was obtained from the patient after a thorough discussion of the procedural risks, benefits and alternatives. All questions were addressed. Maximal Sterile Barrier Technique was utilized including caps, mask, sterile gowns, sterile gloves, sterile drape, hand hygiene and skin antiseptic. A timeout was performed prior to the initiation of the procedure. The patient was placed supine on the exam table. Limited CT of the abdomen and pelvis was performed for planning purposes. This again demonstrated a large intra-abdominal/pelvic anterior fluid collection. Skin entry site was marked, and the overlying skin was prepped and draped in a standard sterile fashion. Local analgesia was obtained with 1% lidocaine. Using intermittent CT fluoroscopy, a 19 gauge Yueh needle was advanced into the fluid collection using a right lateral approach. Location was confirmed with CT, and spontaneous return of thin hemorrhagic fluid. Given size and clinical concern for infection, the decision was made to proceed with drainage catheter placement. An 035 wire was easily advanced into the collection through the [facility] catheter. Subsequently, percutaneous tract was serially dilated to accommodate a 12 [place] multipurpose locking drainage catheter. Location was confirmed with CT and additional return of hemorrhagic fluid. A sample was sent to the lab for microbiology analysis. Locking loop was formed, and the catheter was  secured to skin using silk suture and a dressing. It was attached to bag drainage. The patient tolerated the procedure well without immediate complication. IMPRESSION: Successful CT-guided placement of a 12 French locking drainage catheter into the abdominopelvic collection. Findings compatible with postoperative liquified hematoma. A sample was sent for microbiology analysis. If the collection is determined to be sterile, consider drainage catheter removal after 48-72 hours to minimize risk of secondary infection. Electronically Signed   By: Olive Bass M.D.   On: 03/11/2022 10:21   CT ABDOMEN PELVIS W CONTRAST  Result Date: 03/10/2022 CLINICAL DATA:  Abdominal pain, postop sepsis, postop following C-section with diffuse abdominal pain. EXAM: CT ABDOMEN AND PELVIS WITH CONTRAST TECHNIQUE: Multidetector CT imaging of the abdomen and pelvis was performed using the standard protocol following bolus administration of intravenous contrast. RADIATION DOSE REDUCTION: This exam was performed according to the departmental dose-optimization program which includes automated exposure control, adjustment of the mA and/or kV according to patient size and/or use of iterative reconstruction technique. CONTRAST:  65mL OMNIPAQUE IOHEXOL 300 MG/ML  SOLN COMPARISON:  Postoperative ultrasound earlier today at 17:44. FINDINGS: Lower chest: There are bilateral trace pleural effusions. Lung bases are otherwise clear. The cardiac size is normal. Hepatobiliary: 21.5 cm in length mildly steatotic liver. No mass enhancement or laceration. The gallbladder bile ducts are unremarkable. Pancreas: Unremarkable. Spleen: No mass enhancement or  splenomegaly. Adrenals/Urinary Tract: There is no adrenal mass. Portions of the left nephrogram show striations concerning for pyelonephritis. Embolic disease could appear similar but there is normal opacification in the renal arteries and veins on both sides. There are few small low-density lesions in  both kidneys which are too small to characterize. No urinary stone is seen. The ureters are free of filling defects and the bladder thickness is normal. Stomach/Bowel: The stomach and small bowel show no dilatation or wall thickening. The appendix is not seen. There are thickened folds in the distal ascending colon versus nondistention. Remainder of the large intestine wall is unremarkable. Vascular/Lymphatic: The hepatic portal vein and aorta opacify normally. There is pelvic venous congestion. No adenopathy is seen. Reproductive: Enlarged uterus consistent with recent postpartum state. There is heterogeneous and possibly enhancing material in the uterine cavity. The C-section incision is difficult to see but it is probably in the ventral lower uterine segment with uterus anteverted. The ovaries are not well seen due to overlapping structures but no obvious ovarian mass is evident. Other: There is a large heterogeneous fluid collection centered in the pelvis ventral to the uterus which is consistent with a hematoma/hemoperitoneum. Active bleeding is not excluded. There are few tiny air pockets in some of the fluid which could be due to the recent open procedure or an infectious complication. The collection is roughly U shaped and extends from the area of the distal paracolic gutters inferiorly anterior to the uterus above the bladder, and is in contact with both adnexal areas. Maximum measurements are up to 20 cm transverse, as large as 9 cm AP and at most 15 cm craniocaudal on both sides with additional mild fluid or blood in the pelvic cul-de-sac small amount of lower density fluid in the left-greater-than-right upper abdomen. There are mesenteric edematous features throughout the abdomen and pelvis which could be congestive versus edema related to peritonitis, or edema related to the recent surgery. Outside of the air within the collection no other free air is seen. There is no incarcerated hernia. There is a  small umbilical fat hernia. Musculoskeletal: There are bilateral L5 pars defects with a chronic appearance without spondylolisthesis. No other significant osseous findings. IMPRESSION: 1. Large pelvic hematoma/hemoperitoneum. Detailed description above. Active bleeding not excluded. 2. Scattered air pockets in the collection could represent introduced air from the surgery or infectious complication. 3. Heterogeneous and possibly enhancing intracavitary material in the uterus. Retained products of conception not excluded. 4. Lesser lower-density fluid in the upper abdomen without localization. No other free air. 5. Generalized mesenteric edema which could relate to surgery, peritonitis or fluid overload. There is also pelvic venous congestion with the deep pelvic veins without visible filling defects. 6. Colitis versus nondistention of the distal ascending colon. 7. Striation of portions of the left nephrogram concerning for pyelonephritis. Embolic disease could appear similar but the renal arteries and veins both opacify well. Correlate with urinalysis findings. No bladder thickening. 8. 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 No spondylolisthesis. 9. Discussed over the phone with Dr. Charlotta Newton at 12:01 a.m., 03/10/2022. Electronically Signed   By: Almira Bar M.D.   On: 03/10/2022 00:07   US Pelvis Complete  Result Date: 03/09/2022 CLINICAL DATA:  Postpartum vaginal bleeding. EXAM: TRANSABDOMINAL ULTRASOUND OF PELVIS TECHNIQUE: Transabdominal ultrasound examination of the pelvis was performed including evaluation of the uterus, ovaries, adnexal regions, and pelvic cul-de-sac. COMPARISON:  None Available. FINDINGS: Uterus Measurements: 15.2 cm x 6.2 cm x 9.0 cm = volume: 446.3  mL. No fibroids or other mass visualized. Endometrium Thickness: 24.0 mm. A small amount of fluid is seen within the endometrial canal. Right ovary Measurements: 3.0 cm x 2.6 cm x 2.5 cm = volume: 9.9 mL. A large, complex,  multi-septated fluid collection is seen extending from the anterior aspect of the right adnexa to the left adnexa. This measures approximately 16 cm, as per the ultrasound technologist (no ultrasonographic measurements provided). Left ovary Measurements: 3.4 cm x 2.2 cm x 3.3 cm = volume: 126.2 mL. Normal appearance/no adnexal mass. Other findings:  No abnormal free fluid. IMPRESSION: 1. Enlarged, postpartum uterus with a large anterior, complex fluid collection, as described above. Further evaluation with abdomen and pelvis CT is recommended. Electronically Signed   By: Aram Candela M.D.   On: 03/09/2022 17:44    Labs:  CBC: Recent Labs    03/15/22 0428 03/16/22 0436 03/17/22 0532 03/18/22 0502  WBC 16.9* 11.8* 12.2* 11.5*  HGB 9.8* 9.6* 10.0* 9.5*  HCT 29.3* 29.8* 29.6* 29.8*  PLT 988* 1,043* 1,035* 1,064*    COAGS: Recent Labs    03/09/22 1741  INR 1.4*  APTT 37*    BMP: Recent Labs    03/15/22 0428 03/16/22 0436 03/17/22 0532 03/18/22 0502  NA 137 139 138 138  K 3.0* 3.4* 3.9 4.2  CL 103 110 106 106  CO2 23 23 22 25   GLUCOSE 98 98 100* 90  BUN <5* <5* <5* <5*  CALCIUM 7.9* 7.9* 8.6* 8.6*  CREATININE 0.61 0.62 0.56 0.63  GFRNONAA >60 >60 >60 >60    LIVER FUNCTION TESTS: Recent Labs    03/05/22 0511 03/09/22 1628 03/10/22 0440 03/11/22 0346  BILITOT 0.6 0.8 0.8 0.7  AST 18 13* 14* 16  ALT 12 9 8 9   ALKPHOS 84 79 71 62  PROT 4.9* 6.2* 5.7* 5.4*  ALBUMIN 2.0* 2.0* 1.7* 1.6*     Assessment and Plan:  Improving anterior pelvic infected hematoma/abscess following percutaneous drain.  Abscess cultures were positive for Klebsiella and E. coli.  Stable drain catheter position by today CT she remains on oral antibiotics.  Plan: Drain catheter site was redressed today and inspected.  Catheter site is clean, dry and intact.  Catheter flushed easily with saline and gravity bag exchange for suction bulb in hopes of increasing output.  Continue twice daily  flushing.  Follow-up CT 2 weeks with an outpatient visit.   Electronically Signed: 03/13/22 03/28/2022, 1:37 PM   I spent a total of    25 Minutes in face to face in clinical consultation, greater than 50% of which was counseling/coordinating care for this patient with a postop abscess

## 2022-03-31 ENCOUNTER — Ambulatory Visit (INDEPENDENT_AMBULATORY_CARE_PROVIDER_SITE_OTHER): Payer: Medicaid Other | Admitting: Clinical

## 2022-03-31 ENCOUNTER — Other Ambulatory Visit: Payer: Self-pay | Admitting: Obstetrics and Gynecology

## 2022-03-31 DIAGNOSIS — F4323 Adjustment disorder with mixed anxiety and depressed mood: Secondary | ICD-10-CM | POA: Diagnosis not present

## 2022-03-31 DIAGNOSIS — K651 Peritoneal abscess: Secondary | ICD-10-CM

## 2022-03-31 NOTE — Progress Notes (Signed)
Interventional Radiology Progress Note  26 yo female with pelvic abscess drain, last seen on Friday.   She has concern of "burning" at the site of drain insertion.   Evaluated.  No sign of infection.  Dressing is C/D/I.  No drainage at the site.  The bulb suction is operable.  I emptied the bulb, recharged, and offered education on keeping this on suction.  The drain was not in the clamp on the stat-lock.  We fixed this.    Reassured her that there is no sign of infection or malfunction.  She will follow up again on schedule in 2 weeks.   Signed,  Yvone Neu. Loreta Ave, DO

## 2022-04-02 ENCOUNTER — Ambulatory Visit (INDEPENDENT_AMBULATORY_CARE_PROVIDER_SITE_OTHER): Payer: Medicaid Other | Admitting: Family Medicine

## 2022-04-02 ENCOUNTER — Encounter: Payer: Self-pay | Admitting: Family Medicine

## 2022-04-02 DIAGNOSIS — F53 Postpartum depression: Secondary | ICD-10-CM | POA: Diagnosis not present

## 2022-04-02 MED ORDER — SERTRALINE HCL 50 MG PO TABS: 50.0000 mg | ORAL_TABLET | Freq: Every day | ORAL | 2 refills | Status: DC

## 2022-04-02 MED ORDER — HYDROXYZINE HCL 10 MG PO TABS: 10.0000 mg | ORAL_TABLET | Freq: Three times a day (TID) | ORAL | 0 refills | Status: DC | PRN

## 2022-04-02 NOTE — BH Specialist Note (Signed)
Pt did not arrive to video visit and did not answer the phone; Left HIPPA-compliant message to call back Chijioke Lasser from [facility] at  [phone] (Lunell Robart's office).  ?; left MyChart message for patient.  ? ?

## 2022-04-02 NOTE — Progress Notes (Unsigned)
Post Partum Visit Note  Claire Dolores is a 26 y.o. [id number] female who presents for a postpartum visit. She is 4 weeks postpartum following a primary cesarean section.  I have fully reviewed the prenatal and intrapartum course. The delivery was at 39.5 gestational weeks.  Anesthesia: spinal. Postpartum course has been difficult. Baby is doing well. Baby is feeding by both breast and bottle - Gerber Gentle Soothe . Bleeding staining only. Bowel function is normal. Bladder function is normal. Patient is not sexually active. Contraception method is none. Postpartum depression screening: positive.  Postpartum course notable for readmission with large intra-abdominal hematoma/abscess that required IR drainage and with drain still in place and oral antibiotics. She has been having a really hard time since her delivery with everything that has happened.   The pregnancy intention screening data noted above was reviewed. Potential methods of contraception were discussed. The patient elected to proceed with No data recorded.   Edinburgh Postnatal Depression Scale - 04/02/22 1536       Edinburgh Postnatal Depression Scale:  In the Past 7 Days   I have been able to laugh and see the funny side of things. 0    I have looked forward with enjoyment to things. 0    I have blamed myself unnecessarily when things went wrong. 1    I have been anxious or worried for no good reason. 3    I have felt scared or panicky for no good reason. 0    Things have been getting on top of me. 1    I have been so unhappy that I have had difficulty sleeping. 3    I have felt sad or miserable. 3    I have been so unhappy that I have been crying. 3    The thought of harming myself has occurred to me. 0    Edinburgh Postnatal Depression Scale Total 14             Health Maintenance Due  Topic Date Due   HPV VACCINES (1 - 2-dose series) Never done   PAP-Cervical Cytology Screening  Never done   PAP SMEAR-Modifier  Never  done   COVID-19 Vaccine (3 - Moderna series) 07/08/2021    The following portions of the patient's history were reviewed and updated as appropriate: allergies, current medications, past family history, past medical history, past social history, past surgical history, and problem list.  Review of Systems Pertinent items noted in HPI and remainder of comprehensive ROS otherwise negative.  Objective:  BP 130/90   Pulse (!) 103   Wt 135 lb 12.8 oz (61.6 kg)   LMP 05/27/2021   BMI 24.84 kg/m    General:  alert, cooperative, and appears stated age   Breasts:  not indicated  Lungs: Comfortalbe on room air  Wound Not done  GU exam:  not indicated        Assessment:    There are no diagnoses linked to this encounter.  Normal postpartum exam.   Plan:   Essential components of care per ACOG recommendations:  1.  Mood and well being: Patient with POSITIVE depression screening today. Reviewed local resources for support.  - Patient tobacco use? No.   - hx of drug use? No.   - Long conversation with patient about what appears to be some post partum depression / reactive depression in setting of very traumatic postpartum course with readmission and ongoing presence of intraabdominal drain. Stressed that it is [employer]  to not be OK after everything that has happened. Already engaged with [facility] here at [facility], accepts trial of Zoloft and Hydroxyzine.  - Will see her back in two weeks for mood check  2. Infant care and feeding:  -Patient currently breastmilk feeding? No.  -Social determinants of health (SDOH) reviewed in EPIC. No concerns  3. Sexuality, contraception and birth spacing - Patient does not want a pregnancy in the next year.  Desired family size is unsure.  - Reviewed reproductive life planning. Reviewed contraceptive methods based on pt preferences and effectiveness.  Patient desired Abstinence today.   - Discussed birth spacing of 18 months  4. Sleep and fatigue -Encouraged  family/partner/community support of 4 hrs of uninterrupted sleep to help with mood and fatigue  5. Physical Recovery  - Discussed patients delivery and complications. She describes her labor as bad. - Patient had a C-section failure to progress and fetal intolerance of labor. Patient had no laceration. - Patient has urinary incontinence? No. - Patient is safe to resume physical and sexual activity  6.  Health Maintenance - HM due items addressed No - up to date - Last pap smear No results found for: "DIAGPAP" Pap smear not done at today's visit. Will call [facility] to see if we can get the records.  -Breast Cancer screening indicated? No.   7. Chronic Disease/Pregnancy Condition follow up: Hypertension - ongoing mild range BP today. Not taking nifedipine. Will have her back in two weeks for mood check and repeat BP check at that time  Venora Maples, MD [facility] for [facility], [facility]

## 2022-04-10 ENCOUNTER — Ambulatory Visit
Admission: RE | Admit: 2022-04-10 | Discharge: 2022-04-10 | Disposition: A | Discharge status: Home / Self Care | Payer: Medicaid Other | Source: Ambulatory Visit | Attending: Obstetrics and Gynecology | Admitting: Obstetrics and Gynecology

## 2022-04-10 ENCOUNTER — Encounter: Payer: Self-pay | Admitting: *Deleted

## 2022-04-10 DIAGNOSIS — K651 Peritoneal abscess: Secondary | ICD-10-CM

## 2022-04-10 HISTORY — PX: IR RADIOLOGIST EVAL & MGMT: IMG5224

## 2022-04-10 MED ORDER — IOPAMIDOL (ISOVUE-300) INJECTION 61%
100.0000 mL | Freq: Once | INTRAVENOUS | Status: AC | PRN
  Administered 2022-04-10: 100 mL via INTRAVENOUS

## 2022-04-10 NOTE — Progress Notes (Signed)
Chief Complaint:   Postop abscess   Referring Physician(s): Anyanwu   History of Present Illness: Melissa Guzman is a 26 y.o. female presenting to clinic today with abscess drain.   Hx:  Melissa Guzman experienced postop lower abdominal pelvic abscess following C-section.  Patient had a percutaneous drain placed by IR 03/10/2022.  She was discharged 03/18/2022 on oral antibiotics and a drain to gravity bag.     In review, the cultures were previously positive for Klebsiella and E. coli.  Imaging findings are compatible with infected hematoma/abscess.  Interval Hx: We last saw Melissa Guzman 03/28/22.  Daily drainage at the time was ~40cc daily, and CT showed significant residual fluid.   Today she shows me that the drainage is significantly reduced, about 10cc for the past 2 days.  Scant amber fluid in the gravity bulb.  She continues to flush the tube with saline daily.    She denies any fevers or new concerns/symptoms.   She does have some discomfort at the drain site.   At this point she does not have follow up with OB.   CT today shows scan fluid at the drain site, nearly resolved. Possible this simply represents some residual flush. No new collection.   Past Medical History:  Diagnosis Date   Anemia     Past Surgical History:  Procedure Laterality Date   CESAREAN SECTION N/A 03/01/2022   Procedure: CESAREAN SECTION;  Surgeon: Hermina Staggers, MD;  Location: [facility];  Service: Obstetrics;  Laterality: N/A;   IR RADIOLOGIST EVAL & MGMT  03/28/2022    Allergies: Patient has no known allergies.  Medications: Prior to Admission medications   Medication Sig Start Date End Date Taking? Authorizing Provider  acetaminophen (TYLENOL) 500 MG tablet Take 2 tablets (1,000 mg total) by mouth every 8 (eight) hours as needed (pain). 03/05/22   Worthy Rancher, MD  ferrous sulfate (FERROUSUL) 325 (65 FE) MG tablet Take 1 tablet (325 mg total) by mouth daily with breakfast. 03/18/22   Milas Hock, MD  hydrOXYzine (ATARAX) 10 MG tablet Take 1 tablet (10 mg total) by mouth 3 (three) times daily as needed. 04/02/22   Venora Maples, MD  ibuprofen (ADVIL) 800 MG tablet Take 1 tablet (800 mg total) by mouth 3 (three) times daily with meals as needed for moderate pain, cramping, mild pain, headache or fever. 03/18/22   Milas Hock, MD  NIFEdipine (ADALAT CC) 30 MG 24 hr tablet Take 1 tablet (30 mg total) by mouth daily. Patient not taking: Reported on 04/02/2022 03/05/22   Worthy Rancher, MD  oxyCODONE (OXY IR/ROXICODONE) 5 MG immediate release tablet Take 1 tablet (5 mg total) by mouth every 4 (four) hours as needed for moderate pain. 03/18/22   Milas Hock, MD  Prenatal Vit-Fe Fumarate-FA (PREPLUS) 27-1 MG TABS Take 1 tablet by mouth daily.    [provider]  sertraline (ZOLOFT) 50 MG tablet Take 1 tablet (50 mg total) by mouth daily. 04/02/22   Venora Maples, MD     No family history on file.  Social History   Socioeconomic History   Marital status: Single    Spouse name: Not on file   Number of children: Not on file   Years of education: Not on file   Highest education level: Not on file  Occupational History   Not on file  Tobacco Use   Smoking status: Never   Smokeless tobacco: Never  Vaping Use  Vaping Use: Never used  Substance and Sexual Activity   Alcohol use: Not Currently   Drug use: No   Sexual activity: Yes    Birth control/protection: None  Other Topics Concern   Not on file  Social History Narrative   Not on file   Social Determinants of Health   Financial Resource Strain: Not on file  Food Insecurity: No Food Insecurity (02/26/2022)   Hunger Vital Sign    Worried About Running Out of Food in the Last Year: Never true    Ran Out of Food in the Last Year: Never true  Transportation Needs: No Transportation Needs (12/30/2021)   PRAPARE - Administrator, Civil Service (Medical): No    Lack of Transportation (Non-Medical):  No  Physical Activity: Not on file  Stress: Not on file  Social Connections: Not on file       Review of Systems: A 12 point ROS discussed and pertinent positives are indicated in the HPI above.  All other systems are negative.  Review of Systems  Vital Signs: There were no vitals taken for this visit.    Physical Exam Targeted exam shows that there is no concern for redness irritation at the skin entry site.  ~10cc of fluid in the bulb, about  2 days worth.  Non-tender abdomen.   Mallampati Score:     Imaging: CT ABDOMEN PELVIS W CONTRAST  Result Date: 03/28/2022 CLINICAL DATA:  Postop lower abdominopelvic abscess status post percutaneous drain, following C-section culture positive for Klebsiella and E coli. EXAM: CT ABDOMEN AND PELVIS WITH CONTRAST TECHNIQUE: Multidetector CT imaging of the abdomen and pelvis was performed using the standard protocol following bolus administration of intravenous contrast. RADIATION DOSE REDUCTION: This exam was performed according to the departmental dose-optimization program which includes automated exposure control, adjustment of the mA and/or kV according to patient size and/or use of iterative reconstruction technique. CONTRAST:  ISOVUE-300 IOPAMIDOL (ISOVUE-300) INJECTION 61% COMPARISON:  03/09/2022 FINDINGS: Lower chest: No acute abnormality. Hepatobiliary: Focal fatty infiltration of the liver along the falciform ligament anteriorly. No other hepatic abnormality. Gallbladder nondistended. Common bile duct nondilated. Pancreas: Unremarkable. No pancreatic ductal dilatation or surrounding inflammatory changes. Spleen: Normal in size without focal abnormality. Adrenals/Urinary Tract: Adrenal glands are unremarkable. Kidneys are normal, without renal calculi, focal lesion, or hydronephrosis. Bladder is unremarkable. Stomach/Bowel: Negative for bowel obstruction, significant dilatation, ileus, or free air. Appendix not visualized. Anterior lower  abdominal peripherally enhancing residual fluid collection measures 6.0 x 5.6 x 5.2 cm. This has decreased in size previously measuring up to 20 x 9 x 15 cm. Stable drain catheter position within the residual collection. Small cul-de-sac fluid collection measures 2.5 cm, image 65/2. No significant new abdominopelvic collections. Vascular/Lymphatic: No significant vascular findings are present. No enlarged abdominal or pelvic lymph nodes. Reproductive: Postpartum changes of the uterus. C-section defect noted. Uterus is smaller when compared to 03/09/2022. Other: No abdominal wall hernia. Musculoskeletal: No acute osseous finding.  L5 pars defects noted IMPRESSION: Significantly smaller anterior lower abdominopelvic fluid collection following percutaneous drain. Stable drain catheter position. Residual small 2.5 cm cul-de-sac collection noted. No new abdominopelvic collections. Improving postoperative changes from recent C-section. Electronically Signed   By: Judie Petit.  Shick M.D.   On: 03/28/2022 13:20   IR Radiologist Eval & Mgmt  Result Date: 03/28/2022 Please refer to notes tab for details about interventional procedure. (Op Note)   Labs:  CBC: Recent Labs    03/15/22 0428 03/16/22 0436 03/17/22  0532 03/18/22 0502  WBC 16.9* 11.8* 12.2* 11.5*  HGB 9.8* 9.6* 10.0* 9.5*  HCT 29.3* 29.8* 29.6* 29.8*  PLT 988* 1,043* 1,035* 1,064*    COAGS: Recent Labs    03/09/22 1741  INR 1.4*  APTT 37*    BMP: Recent Labs    03/15/22 0428 03/16/22 0436 03/17/22 0532 03/18/22 0502  NA 137 139 138 138  K 3.0* 3.4* 3.9 4.2  CL 103 110 106 106  CO2 23 23 22 25   GLUCOSE 98 98 100* 90  BUN <5* <5* <5* <5*  CALCIUM 7.9* 7.9* 8.6* 8.6*  CREATININE 0.61 0.62 0.56 0.63  GFRNONAA >60 >60 >60 >60    LIVER FUNCTION TESTS: Recent Labs    03/05/22 0511 03/09/22 1628 03/10/22 0440 03/11/22 0346  BILITOT 0.6 0.8 0.8 0.7  AST 18 13* 14* 16  ALT 12 9 8 9   ALKPHOS 84 79 71 62  PROT 4.9* 6.2* 5.7* 5.4*   ALBUMIN 2.0* 2.0* 1.7* 1.6*    TUMOR MARKERS: No results for input(s): "AFPTM", "CEA", "CA199", "CHROMGRNA" in the last 8760 hours.  Assessment and Plan:  Melissa Guzman is 26 yo female with history of post op abscess after c-section.   Today she confirms that the drainage has essentially stopped, and that she has no new symptoms.  CT confirms that there is only scant residual fluid at the drain site, probably just residual saline flush.   We removed the drain today at the bedside.  Bandage was placed and I discussed care with her.   I also encouraged her to look into when her next OB annual appointment would be, and discuss with them the topic of whether there would be any concerns with a future planned pregnancy.  She says she will look into when her next annual may need to be scheduled.      Electronically Signed: Rosalia Hammers 04/10/2022, 11:19 AM   I spent a total of    15 Minutes in face to face in clinical consultation, greater than 50% of which was counseling/coordinating care for post operative abscess, drainage, and drain removal

## 2022-04-14 ENCOUNTER — Ambulatory Visit: Payer: Medicaid Other | Admitting: Clinical

## 2022-04-14 DIAGNOSIS — Z91199 Patient's noncompliance with other medical treatment and regimen due to unspecified reason: Secondary | ICD-10-CM

## 2022-04-16 ENCOUNTER — Encounter: Payer: Self-pay | Admitting: Family Medicine

## 2022-04-16 ENCOUNTER — Other Ambulatory Visit: Payer: Self-pay

## 2022-04-16 ENCOUNTER — Ambulatory Visit (INDEPENDENT_AMBULATORY_CARE_PROVIDER_SITE_OTHER): Payer: Medicaid Other | Admitting: Family Medicine

## 2022-04-16 VITALS — BP 152/104 | HR 104 | Wt 135.6 lb

## 2022-04-16 DIAGNOSIS — I10 Essential (primary) hypertension: Secondary | ICD-10-CM | POA: Diagnosis not present

## 2022-04-16 DIAGNOSIS — F32A Depression, unspecified: Secondary | ICD-10-CM | POA: Diagnosis not present

## 2022-04-16 DIAGNOSIS — F419 Anxiety disorder, unspecified: Secondary | ICD-10-CM

## 2022-04-16 MED ORDER — NIFEDIPINE ER 30 MG PO TB24: 30.0000 mg | ORAL_TABLET | Freq: Every day | ORAL | 11 refills | Status: DC

## 2022-04-16 NOTE — Progress Notes (Signed)
GYNECOLOGY OFFICE VISIT NOTE  History:   Melissa Guzman is a 26 y.o. G1P1001 here today for BP follow up.  Last seen for PP visit on 04/02/2022, significant depression symptoms at that time, scheduled to see [facility] and started on zoloft/hydroxyzine BP also mild range off meds, plan to recheck today  Today reports she is feeling much better since last visit Coping well Has not picked up meds due to concerns about cost issues, but planning to start them after she gets paid Is back at work which is helping with her mood  Drain removed last Thursday, done with antibiotics   Health Maintenance Due  Topic Date Due   HPV VACCINES (1 - 2-dose series) Never done   COVID-19 Vaccine (3 - Moderna series) 07/08/2021    Past Medical History:  Diagnosis Date   Anemia     Past Surgical History:  Procedure Laterality Date   CESAREAN SECTION N/A 03/01/2022   Procedure: CESAREAN SECTION;  Surgeon: Hermina Staggers, MD;  Location: [facility] LD ORS;  Service: Obstetrics;  Laterality: N/A;   IR RADIOLOGIST EVAL & MGMT  03/28/2022   IR RADIOLOGIST EVAL & MGMT  04/10/2022    The following portions of the patient's history were reviewed and updated as appropriate: allergies, current medications, past family history, past medical history, past social history, past surgical history and problem list.   Health Maintenance:   Last pap: No results found for: "DIAGPAP", "HPV", "HPVHIGH" Records finally obtained from [facility] on 10/13/2019  Last mammogram:  N/a    Review of Systems:  Pertinent items noted in HPI and remainder of comprehensive ROS otherwise negative.  Physical Exam:  BP (!) 152/104   Pulse (!) 104   Wt 135 lb 9.6 oz (61.5 kg)   LMP  (LMP Unknown)   Breastfeeding Yes   BMI 24.80 kg/m  CONSTITUTIONAL: Well-developed, well-nourished female in no acute distress.  HEENT:  Normocephalic, atraumatic. External right and left ear normal. No scleral icterus.  NECK: Normal range of motion, supple, no  masses noted on observation SKIN: No rash noted. Not diaphoretic. No erythema. No pallor. MUSCULOSKELETAL: Normal range of motion. No edema noted. NEUROLOGIC: Alert and oriented to person, place, and time. Normal muscle tone coordination.  PSYCHIATRIC: Normal mood and affect. Normal behavior. Normal judgment and thought content. RESPIRATORY: Effort normal, no problems with respiration noted   Labs and Imaging No results found for this or any previous visit (from the past 168 hour(s)). CT ABDOMEN PELVIS W CONTRAST  Result Date: 04/10/2022 CLINICAL DATA:  26 year old female with post C-section abscess, drained 03/10/2022 EXAM: CT ABDOMEN AND PELVIS WITH CONTRAST TECHNIQUE: Multidetector CT imaging of the abdomen and pelvis was performed using the standard protocol following bolus administration of intravenous contrast. RADIATION DOSE REDUCTION: This exam was performed according to the departmental dose-optimization program which includes automated exposure control, adjustment of the mA and/or kV according to patient size and/or use of iterative reconstruction technique. CONTRAST:  ISOVUE-300 IOPAMIDOL (ISOVUE-300) INJECTION 61% COMPARISON:  Multiple prior most recent 03/28/2022 FINDINGS: Lower chest: No acute finding of the lower chest. Hepatobiliary: Focal fatty infiltration along the falciform ligament with otherwise unremarkable liver. Small volume calcified stones within the gallbladder. No inflammatory changes. Pancreas: Unremarkable Spleen: Unremarkable Adrenals/Urinary Tract: - Right adrenal gland:  Unremarkable - Left adrenal gland: Unremarkable. - Right kidney: No hydronephrosis, nephrolithiasis, inflammation, or ureteral dilation. No focal lesion. - Left Kidney: No hydronephrosis, nephrolithiasis, inflammation, or ureteral dilation. No focal lesion. - Urinary  Bladder: Urinary bladder relatively decompressed Stomach/Bowel: - Stomach: Unremarkable. - Small bowel: Unremarkable - Appendix:  Appendix is not visualized, however, no inflammatory changes are present adjacent to the cecum to indicate an appendicitis. - Colon: Unremarkable. Vascular/Lymphatic: Unremarkable aorta. Renal arteries, mesenteric arteries, iliac arteries and proximal femoral arteries patent. Unremarkable venous structures. No adenopathy. Reproductive: Pigtail drainage catheter from the lateral right lower abdominal approach again terminates anterior to the uterus. Near complete resolution of the previous fluid, now measuring 2.5 cm transversely, previously 7 cm. Additionally the small rim enhancing fluid within the recto uterine space has resolved. No new or increasing fluid. Physiologic changes of the adnexa Other: None Musculoskeletal: No acute or significant osseous findings. IMPRESSION: Unchanged position of pigtail drainage catheter anterior to the uterus, with nearly completely resolved fluid. The secondary fluid collection in the recto uterine space has resolved. Electronically Signed   By: Gilmer Mor D.O.   On: 04/10/2022 12:56   IR Radiologist Eval & Mgmt  Result Date: 04/10/2022 CLINICAL DATA:  26 year old female with post C-section abscess, drained 03/10/2022 EXAM: CT ABDOMEN AND PELVIS WITH CONTRAST TECHNIQUE: Multidetector CT imaging of the abdomen and pelvis was performed using the standard protocol following bolus administration of intravenous contrast. RADIATION DOSE REDUCTION: This exam was performed according to the departmental dose-optimization program which includes automated exposure control, adjustment of the mA and/or kV according to patient size and/or use of iterative reconstruction technique. CONTRAST:  ISOVUE-300 IOPAMIDOL (ISOVUE-300) INJECTION 61% COMPARISON:  Multiple prior most recent 03/28/2022 FINDINGS: Lower chest: No acute finding of the lower chest. Hepatobiliary: Focal fatty infiltration along the falciform ligament with otherwise unremarkable liver. Small volume calcified stones  within the gallbladder. No inflammatory changes. Pancreas: Unremarkable Spleen: Unremarkable Adrenals/Urinary Tract: - Right adrenal gland:  Unremarkable - Left adrenal gland: Unremarkable. - Right kidney: No hydronephrosis, nephrolithiasis, inflammation, or ureteral dilation. No focal lesion. - Left Kidney: No hydronephrosis, nephrolithiasis, inflammation, or ureteral dilation. No focal lesion. - Urinary Bladder: Urinary bladder relatively decompressed Stomach/Bowel: - Stomach: Unremarkable. - Small bowel: Unremarkable - Appendix: Appendix is not visualized, however, no inflammatory changes are present adjacent to the cecum to indicate an appendicitis. - Colon: Unremarkable. Vascular/Lymphatic: Unremarkable aorta. Renal arteries, mesenteric arteries, iliac arteries and proximal femoral arteries patent. Unremarkable venous structures. No adenopathy. Reproductive: Pigtail drainage catheter from the lateral right lower abdominal approach again terminates anterior to the uterus. Near complete resolution of the previous fluid, now measuring 2.5 cm transversely, previously 7 cm. Additionally the small rim enhancing fluid within the recto uterine space has resolved. No new or increasing fluid. Physiologic changes of the adnexa Other: None Musculoskeletal: No acute or significant osseous findings. IMPRESSION: Unchanged position of pigtail drainage catheter anterior to the uterus, with nearly completely resolved fluid. The secondary fluid collection in the recto uterine space has resolved. Electronically Signed   By: Gilmer Mor D.O.   On: 04/10/2022 12:56   CT ABDOMEN PELVIS W CONTRAST  Result Date: 03/28/2022 CLINICAL DATA:  Postop lower abdominopelvic abscess status post percutaneous drain, following C-section culture positive for Klebsiella and E coli. EXAM: CT ABDOMEN AND PELVIS WITH CONTRAST TECHNIQUE: Multidetector CT imaging of the abdomen and pelvis was performed using the standard protocol following bolus  administration of intravenous contrast. RADIATION DOSE REDUCTION: This exam was performed according to the departmental dose-optimization program which includes automated exposure control, adjustment of the mA and/or kV according to patient size and/or use of iterative reconstruction technique. CONTRAST:  ISOVUE-300 IOPAMIDOL (ISOVUE-300) INJECTION 61%  COMPARISON:  03/09/2022 FINDINGS: Lower chest: No acute abnormality. Hepatobiliary: Focal fatty infiltration of the liver along the falciform ligament anteriorly. No other hepatic abnormality. Gallbladder nondistended. Common bile duct nondilated. Pancreas: Unremarkable. No pancreatic ductal dilatation or surrounding inflammatory changes. Spleen: Normal in size without focal abnormality. Adrenals/Urinary Tract: Adrenal glands are unremarkable. Kidneys are normal, without renal calculi, focal lesion, or hydronephrosis. Bladder is unremarkable. Stomach/Bowel: Negative for bowel obstruction, significant dilatation, ileus, or free air. Appendix not visualized. Anterior lower abdominal peripherally enhancing residual fluid collection measures 6.0 x 5.6 x 5.2 cm. This has decreased in size previously measuring up to 20 x 9 x 15 cm. Stable drain catheter position within the residual collection. Small cul-de-sac fluid collection measures 2.5 cm, image 65/2. No significant new abdominopelvic collections. Vascular/Lymphatic: No significant vascular findings are present. No enlarged abdominal or pelvic lymph nodes. Reproductive: Postpartum changes of the uterus. C-section defect noted. Uterus is smaller when compared to 03/09/2022. Other: No abdominal wall hernia. Musculoskeletal: No acute osseous finding.  L5 pars defects noted IMPRESSION: Significantly smaller anterior lower abdominopelvic fluid collection following percutaneous drain. Stable drain catheter position. Residual small 2.5 cm cul-de-sac collection noted. No new abdominopelvic collections. Improving  postoperative changes from recent C-section. Electronically Signed   By: Judie Petit.  Shick M.D.   On: 03/28/2022 13:20   IR Radiologist Eval & Mgmt  Result Date: 03/28/2022 Please refer to notes tab for details about interventional procedure. (Op Note)     Assessment and Plan:   Problem List Items Addressed This Visit       Cardiovascular and Mediastinum   Essential hypertension - Primary    At this point given amount of time off of meds and BP today this is most c/w cHTN. Discussed diagnosis and importance of treating to prevent long term complications. Will restart Nifedipine and have BP check in 2 weeks when she returns with baby for well child check.       Relevant Medications   NIFEdipine (ADALAT CC) 30 MG 24 hr tablet     Other   Anxiety and depression    Feeling better but still planning on starting medications, encouraged her to do so.        Routine preventative health maintenance measures emphasized. Please refer to After Visit Summary for other counseling recommendations.   Return in about 2 weeks (around 04/30/2022) for BP check.    Total face-to-face time with patient: 20 minutes.  Over 50% of encounter was spent on counseling and coordination of care.   Venora Maples, MD/MPH Attending Family Medicine Physician, [facility] for [facility], [facility]

## 2022-04-16 NOTE — Assessment & Plan Note (Signed)
At this point given amount of time off of meds and BP today this is most c/w cHTN. Discussed diagnosis and importance of treating to prevent long term complications. Will restart Nifedipine and have BP check in 2 weeks when she returns with baby for well child check.

## 2022-04-16 NOTE — Assessment & Plan Note (Signed)
Feeling better but still planning on starting medications, encouraged her to do so.

## 2022-05-02 ENCOUNTER — Ambulatory Visit (INDEPENDENT_AMBULATORY_CARE_PROVIDER_SITE_OTHER): Payer: Medicaid Other | Admitting: Family Medicine

## 2022-05-02 VITALS — BP 160/104 | HR 78

## 2022-05-02 DIAGNOSIS — Z8759 Personal history of other complications of pregnancy, childbirth and the puerperium: Secondary | ICD-10-CM | POA: Diagnosis not present

## 2022-05-02 DIAGNOSIS — I10 Essential (primary) hypertension: Secondary | ICD-10-CM | POA: Diagnosis not present

## 2022-05-02 DIAGNOSIS — O85 Puerperal sepsis: Secondary | ICD-10-CM

## 2022-05-05 ENCOUNTER — Telehealth: Payer: Self-pay | Admitting: *Deleted

## 2022-05-05 ENCOUNTER — Encounter: Payer: Self-pay | Admitting: Family Medicine

## 2022-05-05 NOTE — Telephone Encounter (Signed)
Quentin called and ask for nurse to call her about her blood pressure medicine- that the pharmacy does not have anything for her. I called [facility] and we discussed she  had Nifedipine ordered 04/16/22 and sent to [facility] [address] [employer] She states she checked there and [employer] [facility]. I explained we have sent it to [facility] and I will call there and make sure they received it , and to get it ready for her to pick up in a few hours. She voices understanding. I called [facility] and they confirmed they did not get the order, verbal order given.  Nancy Fetter

## 2022-05-05 NOTE — Progress Notes (Signed)
    POSTPARTUM PROBLEM  VISIT ENCOUNTER NOTE  Subjective:   Melissa Guzman is a 26 y.o. G10P1001 female here for a problem visit.  Pregnancy was complicated by gHTN  Patient had C-section, no problems at delivery - for NRFHT about 8 weeks ago. Was readmitted with endometritis. Also having pp depression.anxiety sx. Put on Zoloft in July and seeing [facility] (last seen 7/17).   Since delivery patient reports she has been feeling good.  At 6 wk pp visit was NOT taking nifedipine. She continues to not take this medication  Infant feeding: Patient is not providing breastmilk.   Current complaints: None-- her for infant check but also for BP check.   Denies abnormal vaginal bleeding, discharge, pelvic pain, problems with intercourse or other gynecologic concerns.    Gynecologic History No LMP recorded (lmp unknown).  Contraception: abstinence  Health Maintenance Due  Topic Date Due   HPV VACCINES (1 - 2-dose series) Never done   COVID-19 Vaccine (3 - Moderna series) 07/08/2021   INFLUENZA VACCINE  04/29/2022    The following portions of the patient's history were reviewed and updated as appropriate: allergies, current medications, past family history, past medical history, past social history, past surgical history and problem list.  Review of Systems Pertinent items are noted in HPI.   Objective:  BP (!) 160/104   Pulse 78   LMP  (LMP Unknown)  Gen: well appearing, NAD HEENT: no scleral icterus CV: RR Lung: Normal WOB Ext: warm well perfused   Assessment and Plan:   1. Essential hypertension Poorly controlled Discussed importance of taking medications to control her BP Accepts referral to [facility] cardiology  Patient is on CCB (Nifedipine) could consider ACE-I but patient is not on contraceptive method currently  2. Postpartum septic endometritis Resolved  3. History of gestational hypertension  Face to face time: 30 minutes  Greater than 50% of the visit time was spent in  counseling and coordination of care with the patient.  The summary and outline of the counseling and care coordination is summarized in the note above.   All questions were answered.  Please refer to After Visit Summary for other counseling recommendations.   Return in about 2 months (around 07/02/2022) for Mom+Baby Combined Care, BP check with [facility] visit.  Federico Flake, MD, MPH, ABFM Attending Physician Faculty Practice- Center for [facility]

## 2022-05-27 NOTE — Progress Notes (Deleted)
Cardio-Obstetrics Clinic  New Evaluation  Date:  05/30/2022   ID:  Melissa Guzman, DOB [birth year], MRN [id number]  PCP:  Pcp, No   [facility] Providers Cardiologist:  None  Electrophysiologist:  None     Referring MD: Federico Flake,*   Chief Complaint: HTN  History of Present Illness:    Melissa Guzman is a 26 y.o. female [[id number]] who is being seen today for the evaluation of HTN at the request of Federico Flake,*.   Patient was seen by Dr. Alvester Morin on 05/02/22. Note reviewed. Had gestational hypertension. She stopped her nifedipine and BP was not well controlled at her follow-up post-partum visit prompting referral here.  Today, ***   Prior CV Studies Reviewed: The following studies were reviewed today: ***  Past Medical History:  Diagnosis Date   Anemia     Past Surgical History:  Procedure Laterality Date   CESAREAN SECTION N/A 03/01/2022   Procedure: CESAREAN SECTION;  Surgeon: Hermina Staggers, MD;  Location: [facility];  Service: Obstetrics;  Laterality: N/A;   IR RADIOLOGIST EVAL & MGMT  03/28/2022   IR RADIOLOGIST EVAL & MGMT  04/10/2022   { Click here to update PMH, PSH, OB Hx then refresh note  :1}   OB History     Gravida  1   Para  1   Term  1   Preterm  0   AB  0   Living  1      SAB  0   IAB  0   Ectopic  0   Multiple  0   Live Births  1           { Click here to update OB Charting then refresh note  :1}    Current Medications: No outpatient medications have been marked as taking for the 05/30/22 encounter (Appointment) with Meriam Sprague, MD.     Allergies:   Patient has no known allergies.   Social History   Socioeconomic History   Marital status: Single    Spouse name: Not on file   Number of children: Not on file   Years of education: Not on file   Highest education level: Not on file  Occupational History   Not on file  Tobacco Use   Smoking status: Never   Smokeless tobacco: Never   Vaping Use   Vaping Use: Never used  Substance and Sexual Activity   Alcohol use: Not Currently   Drug use: No   Sexual activity: Yes    Birth control/protection: None  Other Topics Concern   Not on file  Social History Narrative   Not on file   Social Determinants of Health   Financial Resource Strain: Not on file  Food Insecurity: No Food Insecurity (02/26/2022)   Hunger Vital Sign    Worried About Running Out of Food in the Last Year: Never true    Ran Out of Food in the Last Year: Never true  Transportation Needs: No Transportation Needs (12/30/2021)   PRAPARE - Administrator, Civil Service (Medical): No    Lack of Transportation (Non-Medical): No  Physical Activity: Not on file  Stress: Not on file  Social Connections: Not on file  { Click here to update SDOH then refresh :1}    No family history on file. { Click here to update FH then refresh note    :1}   ROS:   Please see the history of present  illness.    *** All other systems reviewed and are negative.   Labs/EKG Reviewed:    EKG:   EKG is *** ordered today.  The ekg ordered today demonstrates ***  Recent Labs: 03/09/2022: Magnesium 1.7 03/11/2022: ALT 9 03/18/2022: BUN <5; Creatinine, Ser 0.63; Hemoglobin 9.5; Platelets 1,064; Potassium 4.2; Sodium 138   Recent Lipid Panel No results found for: "CHOL", "TRIG", "HDL", "CHOLHDL", "LDLCALC", "LDLDIRECT"  Physical Exam:    VS:  There were no vitals taken for this visit.    Wt Readings from Last 3 Encounters:  04/16/22 135 lb 9.6 oz (61.5 kg)  04/02/22 135 lb 12.8 oz (61.6 kg)  03/11/22 162 lb 11.2 oz (73.8 kg)     GEN: *** Well nourished, well developed in no acute distress HEENT: Normal NECK: No JVD; No carotid bruits LYMPHATICS: No lymphadenopathy CARDIAC: ***RRR, no murmurs, rubs, gallops RESPIRATORY:  Clear to auscultation without rales, wheezing or rhonchi  ABDOMEN: Soft, non-tender, non-distended MUSCULOSKELETAL:  No edema; No  deformity  SKIN: Warm and dry NEUROLOGIC:  Alert and oriented x 3 PSYCHIATRIC:  Normal affect    Risk Assessment/Risk Calculators:   { Click to calculate CARPREG II - THEN refresh note :1}    { Click to caclulate Mod WHO Class of CV Risk - THEN refresh note :1}     { Click for CHADS2VASc Score - THEN Refresh Note    :[id number]}      ASSESSMENT & PLAN:    #Chronic HTN: Patient with gestational hypertension that has persisted in the postpartum period likely consistent with chronic hypertension. Currently not taking nifedipine and BP has been ***. -Start amlodipine 5mg  daily -Refer to pharm D for further med titration  There are no Patient Instructions on file for this visit.   Dispo:  No follow-ups on file.   Medication Adjustments/Labs and Tests Ordered: Current medicines are reviewed at length with the patient today.  Concerns regarding medicines are outlined above.  Tests Ordered: No orders of the defined types were placed in this encounter.  Medication Changes: No orders of the defined types were placed in this encounter.

## 2022-05-30 ENCOUNTER — Ambulatory Visit: Payer: Medicaid Other | Admitting: Cardiology

## 2022-06-23 NOTE — Progress Notes (Deleted)
Cardio-Obstetrics Clinic  New Evaluation  Date:  06/26/2022   ID:  Melissa Guzman, DOB 1996/08/15, MRN [id number]  PCP:  Pcp, No   [facility] Providers Cardiologist:  None  Electrophysiologist:  None     Referring MD: Melissa Guzman,*   Chief Complaint: HTN  History of Present Illness:    Melissa Guzman is a 26 y.o. female [[id number]] who is being seen today for the evaluation of HTN at the request of Melissa Guzman,*.   Patient seen by Dr. Kimberlee Nearing on 05/02/22. Note reviewed. Has history of gestational hypertension during her recent pregnancy and was maintained on nifedipine. Continues to have elevated blood pressures in the post-partum period. She was told to resume her nifedipine and was referred to Cardio-OB clinic for further management.  Today, ***  Prior CV Studies Reviewed: The following studies were reviewed today: ***  Past Medical History:  Diagnosis Date   Anemia     Past Surgical History:  Procedure Laterality Date   CESAREAN SECTION N/A 03/01/2022   Procedure: CESAREAN SECTION;  Surgeon: Chancy Milroy, MD;  Location: [facility];  Service: Obstetrics;  Laterality: N/A;   IR RADIOLOGIST EVAL & MGMT  03/28/2022   IR RADIOLOGIST EVAL & MGMT  8/92/1194   { Click here to update PMH, PSH, OB Hx then refresh note  :1}   OB History     Gravida  1   Para  1   Term  1   Preterm  0   AB  0   Living  1      SAB  0   IAB  0   Ectopic  0   Multiple  0   Live Births  1           { Click here to update OB Charting then refresh note  :1}    Current Medications: No outpatient medications have been marked as taking for the 06/27/22 encounter (Appointment) with Freada Bergeron, MD.     Allergies:   Patient has no known allergies.   Social History   Socioeconomic History   Marital status: Single    Spouse name: Not on file   Number of children: Not on file   Years of education: Not on file   Highest education level: Not  on file  Occupational History   Not on file  Tobacco Use   Smoking status: Never   Smokeless tobacco: Never  Vaping Use   Vaping Use: Never used  Substance and Sexual Activity   Alcohol use: Not Currently   Drug use: No   Sexual activity: Yes    Birth control/protection: None  Other Topics Concern   Not on file  Social History Narrative   Not on file   Social Determinants of Health   Financial Resource Strain: Not on file  Food Insecurity: No Food Insecurity (02/26/2022)   Hunger Vital Sign    Worried About Running Out of Food in the Last Year: Never true    Ran Out of Food in the Last Year: Never true  Transportation Needs: No Transportation Needs (12/30/2021)   PRAPARE - Hydrologist (Medical): No    Lack of Transportation (Non-Medical): No  Physical Activity: Not on file  Stress: Not on file  Social Connections: Not on file  { Click here to update SDOH then refresh :1}    No family history on file. { Click here to update FH then  refresh note    :1}   ROS:   Please see the history of present illness.    *** All other systems reviewed and are negative.   Labs/EKG Reviewed:    EKG:   EKG is *** ordered today.  The ekg ordered today demonstrates ***  Recent Labs: 03/09/2022: Magnesium 1.7 03/11/2022: ALT 9 03/18/2022: BUN <5; Creatinine, Ser 0.63; Hemoglobin 9.5; Platelets 1,064; Potassium 4.2; Sodium 138   Recent Lipid Panel No results found for: "CHOL", "TRIG", "HDL", "CHOLHDL", "LDLCALC", "LDLDIRECT"  Physical Exam:    VS:  There were no vitals taken for this visit.    Wt Readings from Last 3 Encounters:  04/16/22 135 lb 9.6 oz (61.5 kg)  04/02/22 135 lb 12.8 oz (61.6 kg)  03/11/22 162 lb 11.2 oz (73.8 kg)     GEN: *** Well nourished, well developed in no acute distress HEENT: Normal NECK: No JVD; No carotid bruits LYMPHATICS: No lymphadenopathy CARDIAC: ***RRR, no murmurs, rubs, gallops RESPIRATORY:  Clear to  auscultation without rales, wheezing or rhonchi  ABDOMEN: Soft, non-tender, non-distended MUSCULOSKELETAL:  No edema; No deformity  SKIN: Warm and dry NEUROLOGIC:  Alert and oriented x 3 PSYCHIATRIC:  Normal affect    Risk Assessment/Risk Calculators:   { Click to calculate CARPREG II - THEN refresh note :1}    { Click to caclulate Mod WHO Class of CV Risk - THEN refresh note :1}     { Click for CHADS2VASc Score - THEN Refresh Note    :[id number]}      ASSESSMENT & PLAN:    #Chronic HTN: #History of Gestational HTN: Patient with hypertension in prior pregnancy that has persisted in the post-partum period and is likely now chronic hypertension. Was placed on nifedipine but has not been compliant with the medication. Bps currently running *** -Continue nifedipine 30mg  daily  There are no Patient Instructions on file for this visit.   Dispo:  No follow-ups on file.   Medication Adjustments/Labs and Tests Ordered: Current medicines are reviewed at length with the patient today.  Concerns regarding medicines are outlined above.  Tests Ordered: No orders of the defined types were placed in this encounter.  Medication Changes: No orders of the defined types were placed in this encounter.

## 2022-06-27 ENCOUNTER — Ambulatory Visit: Payer: Medicaid Other | Admitting: Cardiology

## 2022-07-03 ENCOUNTER — Other Ambulatory Visit: Payer: Self-pay

## 2022-07-03 ENCOUNTER — Encounter (HOSPITAL_COMMUNITY): Payer: Self-pay | Admitting: Emergency Medicine

## 2022-07-03 ENCOUNTER — Ambulatory Visit (HOSPITAL_COMMUNITY)
Admission: EM | Admit: 2022-07-03 | Discharge: 2022-07-03 | Disposition: A | Discharge status: Home / Self Care | Payer: Medicaid Other | Attending: Internal Medicine | Admitting: Internal Medicine

## 2022-07-03 DIAGNOSIS — J029 Acute pharyngitis, unspecified: Secondary | ICD-10-CM | POA: Insufficient documentation

## 2022-07-03 DIAGNOSIS — J069 Acute upper respiratory infection, unspecified: Secondary | ICD-10-CM | POA: Insufficient documentation

## 2022-07-03 DIAGNOSIS — Z1152 Encounter for screening for COVID-19: Secondary | ICD-10-CM | POA: Diagnosis not present

## 2022-07-03 LAB — RESP PANEL BY RT-PCR (FLU A&B, COVID) ARPGX2
Influenza A by PCR: NEGATIVE
Influenza B by PCR: NEGATIVE
SARS Coronavirus 2 by RT PCR: NEGATIVE

## 2022-07-03 LAB — POCT RAPID STREP A, ED / UC: Streptococcus, Group A Screen (Direct): NEGATIVE

## 2022-07-03 MED ORDER — BENZONATATE 100 MG PO CAPS: 100.0000 mg | ORAL_CAPSULE | Freq: Three times a day (TID) | ORAL | 0 refills | Status: DC

## 2022-07-03 MED ORDER — IBUPROFEN 600 MG PO TABS: 600.0000 mg | ORAL_TABLET | Freq: Four times a day (QID) | ORAL | 0 refills | Status: DC | PRN

## 2022-07-03 MED ORDER — IBUPROFEN 800 MG PO TABS
ORAL_TABLET | ORAL | Status: AC
  Filled 2022-07-03: qty 1

## 2022-07-03 MED ORDER — IBUPROFEN 800 MG PO TABS
800.0000 mg | ORAL_TABLET | Freq: Once | ORAL | Status: AC
  Administered 2022-07-03: 800 mg via ORAL

## 2022-07-03 MED ORDER — GUAIFENESIN ER 1200 MG PO TB12: 1200.0000 mg | ORAL_TABLET | Freq: Two times a day (BID) | ORAL | 0 refills | Status: DC

## 2022-07-03 NOTE — ED Triage Notes (Signed)
Last night started with sore throat, chills, and fever reported as 100 and another time as 101.    Took theraflu

## 2022-07-03 NOTE — Discharge Instructions (Signed)
You have a viral upper respiratory infection.  Your strep testing is negative. Throat culture is pending. COVID-19 and flu testing is pending. We will call you with results if positive. If your COVID test is positive, you must stay at home until day 6 of symptoms. On day 6, you may go out into public and go back to work, but you must wear a mask until day 11 of symptoms to prevent spread to others.  Take guaifenesin 1200mg   every 12 hours to thin your mucous so that you can get it out of your body easier with coughing/blowing your nose. Drink plenty of water while taking this medication so that it works well in your body (at least 8 cups a day).   Take tessalon pearles every 8 hours as needed for cough.  You may take tylenol 1,000mg  and ibuprofen 600mg  every 6 hours with food as needed for fever/chills, sore throat, aches/pains, and inflammation associated with viral illness. Take this with food to avoid stomach upset.    You may do salt water and baking soda gargles every 4 hours as needed for your throat pain.  Please put 1 teaspoon of salt and 1/2 teaspoon of baking soda in 8 ounces of warm water then gargle and spit the water out. You may also put 1 tablespoon of honey in warm water and drink this to soothe your throat.  Place a humidifier in your room at night to help decrease dry air that can irritate your airway and cause you to have a sore throat and cough.  Please try to eat a well-balanced diet while you are sick so that your body gets proper nutrition to heal.  If you develop any new or worsening symptoms, please return.  If your symptoms are severe, please go to the emergency room.  Follow-up with your primary care provider for further evaluation and management of your symptoms as well as ongoing wellness visits.  I hope you feel better!

## 2022-07-03 NOTE — ED Provider Notes (Signed)
[facility]    CSN: [id number] Arrival date & time: 07/03/22  1119      History   Chief Complaint Chief Complaint  Patient presents with   Sore Throat    HPI Melissa Guzman is a 26 y.o. female.   Presents to urgent care for evaluation of headache, sore throat, nasal congestion, and fever that started overnight last night.  Patient presents to urgent care for evaluation of sore throat, nasal congestion, body aches, fever/chills, headache, fatigue that started yesterday.  Nasal congestion is thick. Sore throat is worsened with swallowing and is a 5 on a scale of 0-10 currently. Headache is localized to the frontal aspect of the head and is currently a 5 on a scale of 0-10.  Denies vision changes and dizziness. Fever was 101 at the highest at home last night.   Denies shortness of breath, chest pain, nausea, vomiting, abdominal pain, diarrhea, and eye drainage.  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  Denies history of asthma or chronic respiratory problems. Patient is not a smoker and denies drug use. They are vaccinated against COVID-19 and they have not received their seasonal flu vaccine this year.  Has attempted use of theraflu prior to arrival at urgent care for relief of symptoms with a little bit of relief.    BP is noticeably high in the clinic.  She takes BP meds currently but cannot remember the name.  She recently gave birth and had a history of gestational hypertension as well as postpartum hypertension.  OB/GYN manages her blood pressure medication and she takes this as prescribed every day.  No dizziness or blurry vision as stated above. She is not currently breast-feeding.   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 Authorizing Provider  benzonatate (TESSALON) 100 MG capsule Take 1 capsule (100 mg total) by mouth every 8 (eight) hours. 07/03/22  Yes Carlisle Beers, FNP  Guaifenesin 1200 MG TB12 Take 1 tablet (1,200 mg total) by mouth in the morning and at bedtime.  07/03/22  Yes Zanya Lindo, Donavan Burnet, FNP  NON FORMULARY Patient reports taking a blood pressure pill QOD, did not tak today   Yes [provider]  acetaminophen (TYLENOL) 500 MG tablet Take 2 tablets (1,000 mg total) by mouth every 8 (eight) hours as needed (pain). 03/05/22   Worthy Rancher, MD  ferrous sulfate (FERROUSUL) 325 (65 FE) MG tablet Take 1 tablet (325 mg total) by mouth daily with breakfast. Patient not taking: Reported on 04/16/2022 03/18/22   Milas Hock, MD  hydrOXYzine (ATARAX) 10 MG tablet Take 1 tablet (10 mg total) by mouth 3 (three) times daily as needed. Patient not taking: Reported on 04/16/2022 04/02/22   Venora Maples, MD  ibuprofen (ADVIL) 600 MG tablet Take 1 tablet (600 mg total) by mouth every 6  (six) hours as needed for moderate pain, cramping, mild pain, headache or fever. 07/03/22   Carlisle Beers, FNP  NIFEdipine (ADALAT CC) 30 MG 24 hr tablet Take 1 tablet (30 mg total) by mouth daily. Patient not taking: Reported on 05/02/2022 04/16/22   Venora Maples, MD  Prenatal Vit-Fe Fumarate-FA (PREPLUS) 27-1 MG TABS Take 1 tablet by mouth daily.    [provider]  sertraline (ZOLOFT) 50 MG tablet Take 1 tablet (50 mg total) by mouth daily. Patient not taking: Reported on 04/16/2022 04/02/22   Venora Maples, MD    Family History History reviewed. No pertinent family history.  Social History Social History   Tobacco Use   Smoking status: Never   Smokeless tobacco: Never  Vaping Use   Vaping Use: Never used  Substance Use Topics   Alcohol use: Yes   Drug use: No     Allergies   Patient has no known allergies.   Review of Systems Review of Systems Per HPI  Physical Exam Triage Vital Signs ED Triage Vitals  Enc Vitals Group     BP 07/03/22 1319 (!) 153/108     Pulse Rate 07/03/22 1319 (!) 114     Resp 07/03/22 1319 20     Temp 07/03/22 1319 99.7 F (37.6 C)     Temp Source 07/03/22 1319 Oral     SpO2 07/03/22 1319 95 %     Weight --      Height --      Head Circumference --      Peak Flow --      Pain Score 07/03/22 1317 7     Pain Loc --      Pain Edu? --      Excl. in GC? --    No data found.  Updated Vital Signs BP (!) 153/108 (BP Location: Right Arm)   Pulse (!) 114   Temp 99.7 F (37.6 C) (Oral)   Resp 20   LMP 06/23/2022   SpO2 95%   Visual Acuity Right Eye Distance:   Left Eye Distance:   Bilateral Distance:    Right Eye Near:   Left Eye Near:    Bilateral Near:     Physical Exam Vitals and nursing note reviewed.  Constitutional:      Appearance: She is not ill-appearing or toxic-appearing.  HENT:     Head: Normocephalic and atraumatic.     Right Ear: Hearing, tympanic membrane, ear canal and external ear  normal.     Left Ear: Hearing, tympanic membrane, ear canal and external ear normal.     Nose: Congestion present.     Mouth/Throat:  Lips: Pink.     Mouth: Mucous membranes are moist.     Pharynx: Posterior oropharyngeal erythema present.  Eyes:     General: Lids are normal. Vision grossly intact. Gaze aligned appropriately.     Extraocular Movements: Extraocular movements intact.     Conjunctiva/sclera: Conjunctivae normal.  Cardiovascular:     Rate and Rhythm: Regular rhythm. Tachycardia present.     Heart sounds: Normal heart sounds, S1 normal and S2 normal.  Pulmonary:     Effort: Pulmonary effort is normal. No respiratory distress.     Breath sounds: Normal breath sounds and air entry.     Comments: No adventitious lung sounds are to auscultation. Musculoskeletal:     Cervical back: Neck supple.  Skin:    General: Skin is warm and dry.     Capillary Refill: Capillary refill takes less than 2 seconds.     Findings: No rash.  Neurological:     General: No focal deficit present.     Mental Status: She is alert and oriented to person, place, and time. Mental status is at baseline.     Cranial Nerves: No dysarthria or facial asymmetry.  Psychiatric:        Mood and Affect: Mood normal.        Speech: Speech normal.        Behavior: Behavior normal.        Thought Content: Thought content normal.        Judgment: Judgment normal.      UC Treatments / Results  Labs (all labs ordered are listed, but only abnormal results are displayed) Labs Reviewed  RESP PANEL BY RT-PCR (FLU A&B, COVID) ARPGX2  POCT RAPID STREP A, ED / UC    EKG   Radiology No results found.  Procedures Procedures (including critical care time)  Medications Ordered in UC Medications  ibuprofen (ADVIL) tablet 800 mg (800 mg Oral Given 07/03/22 1352)    Initial Impression / Assessment and Plan / UC Course  I have reviewed the triage vital signs and the nursing notes.  Pertinent labs &  imaging results that were available during my care of the patient were reviewed by me and considered in my medical decision making (see chart for details).   Viral URI with cough Symptoms and physical exam consistent with a viral upper respiratory tract infection that will likely resolve with rest, fluids, and prescriptions for symptomatic relief. No indication for imaging today based on stable cardiopulmonary exam and hemodynamically stable vital signs.  COVID-19 and flu testing is pending.  Strep throat point-of-care testing is negative.  We will call patient if this is positive.  Quarantine guidelines discussed. Currently on day 2 of symptoms and does qualify for antiviral therapy.   Patient given ibuprofen in clinic today for headache and sore throat.  Guaifenesin and Tessalon Perles sent to pharmacy for symptomatic relief to be taken as prescribed.   May use ibuprofen/tylenol over the counter for body aches, fever/chills, and overall discomfort associated with viral illness. Nonpharmacologic interventions for symptom relief provided and after visit summary below.   Strict ED/urgent care return precautions given.  Patient verbalizes understanding and agreement with plan.  Counseled patient regarding possible side effects and uses of all medications prescribed at today's visit.  Patient verbalizes understanding and agreement with plan.  All questions answered.  Patient discharged from urgent care in stable condition.         Final Clinical Impressions(s) / UC Diagnoses   Final  diagnoses:  Viral URI with cough  Sore throat     Discharge Instructions      You have a viral upper respiratory infection.  Your strep testing is negative. Throat culture is pending. COVID-19 and flu testing is pending. We will call you with results if positive. If your COVID test is positive, you must stay at home until day 6 of symptoms. On day 6, you may go out into public and go back to work, but you must  wear a mask until day 11 of symptoms to prevent spread to others.  Take guaifenesin 1200mg   every 12 hours to thin your mucous so that you can get it out of your body easier with coughing/blowing your nose. Drink plenty of water while taking this medication so that it works well in your body (at least 8 cups a day).   Take tessalon pearles every 8 hours as needed for cough.  You may take tylenol 1,000mg  and ibuprofen 600mg  every 6 hours with food as needed for fever/chills, sore throat, aches/pains, and inflammation associated with viral illness. Take this with food to avoid stomach upset.    You may do salt water and baking soda gargles every 4 hours as needed for your throat pain.  Please put 1 teaspoon of salt and 1/2 teaspoon of baking soda in 8 ounces of warm water then gargle and spit the water out. You may also put 1 tablespoon of honey in warm water and drink this to soothe your throat.  Place a humidifier in your room at night to help decrease dry air that can irritate your airway and cause you to have a sore throat and cough.  Please try to eat a well-balanced diet while you are sick so that your body gets proper nutrition to heal.  If you develop any new or worsening symptoms, please return.  If your symptoms are severe, please go to the emergency room.  Follow-up with your primary care provider for further evaluation and management of your symptoms as well as ongoing wellness visits.  I hope you feel better!      ED Prescriptions     Medication Sig Dispense Auth. Provider   ibuprofen (ADVIL) 600 MG tablet Take 1 tablet (600 mg total) by mouth every 6 (six) hours as needed for moderate pain, cramping, mild pain, headache or fever. 30 tablet Joella Prince M, FNP   Guaifenesin 1200 MG TB12 Take 1 tablet (1,200 mg total) by mouth in the morning and at bedtime. 14 tablet Joella Prince M, FNP   benzonatate (TESSALON) 100 MG capsule Take 1 capsule (100 mg total) by mouth every 8  (eight) hours. 21 capsule Talbot Grumbling, FNP      PDMP not reviewed this encounter.   Talbot Grumbling, [place] 07/03/22 1408

## 2022-10-30 ENCOUNTER — Encounter: Payer: Self-pay | Admitting: Family Medicine

## 2022-10-30 MED ORDER — METRONIDAZOLE 500 MG PO TABS: 500.0000 mg | ORAL_TABLET | Freq: Two times a day (BID) | ORAL | 0 refills | Status: DC

## 2022-11-19 ENCOUNTER — Encounter: Payer: Self-pay | Admitting: Family Medicine

## 2022-11-24 ENCOUNTER — Other Ambulatory Visit: Payer: Self-pay

## 2022-11-24 ENCOUNTER — Other Ambulatory Visit (HOSPITAL_COMMUNITY)
Admission: RE | Admit: 2022-11-24 | Discharge: 2022-11-24 | Disposition: A | Discharge status: Home / Self Care | Payer: Medicaid Other | Source: Ambulatory Visit | Attending: Family Medicine | Admitting: Family Medicine

## 2022-11-24 ENCOUNTER — Ambulatory Visit (INDEPENDENT_AMBULATORY_CARE_PROVIDER_SITE_OTHER): Payer: Medicaid Other | Admitting: Family Medicine

## 2022-11-24 ENCOUNTER — Encounter: Payer: Self-pay | Admitting: Family Medicine

## 2022-11-24 VITALS — BP 131/78 | HR 118 | Ht 61.0 in | Wt 144.4 lb

## 2022-11-24 DIAGNOSIS — Z01419 Encounter for gynecological examination (general) (routine) without abnormal findings: Secondary | ICD-10-CM

## 2022-11-24 NOTE — Progress Notes (Signed)
GYNECOLOGY ANNUAL PREVENTATIVE CARE ENCOUNTER NOTE  Subjective:   Melissa Guzman is a 27 y.o. G24P1001 female here for a routine annual gynecologic exam.  Current complaints:   Last unprotected sex was 2/8, LMP started 2/25. Previous period was 10/30/22.  Denies abnormal vaginal bleeding, discharge, pelvic pain, problems with intercourse or other gynecologic concerns.    Upstream - 11/24/22 1640       Pregnancy Intention Screening   Does the patient want to become pregnant in the next year? No    Does the patient's partner want to become pregnant in the next year? No    Would the patient like to discuss contraceptive options today? No      Contraception Wrap Up   Current Method Withdrawal or Other Method    End Method Withdrawal or Other Method;FAM or LAM    Contraception Counseling Provided Yes    How was the end contraceptive method provided? Provided on site            The pregnancy intention screening data noted above was reviewed. Potential methods of contraception were discussed. The patient elected to proceed with Withdrawal or Other Method; FAM or LAM.   Gynecologic History Patient's last menstrual period was 11/23/2022 (exact date). Contraception: none Last Pap: 2021. Results were: normal Last mammogram: NA.   Health Maintenance Due  Topic Date Due   HPV VACCINES (1 - 2-dose series) Never done   INFLUENZA VACCINE  Never done   COVID-19 Vaccine (3 - 2023-24 season) 05/30/2022   PAP-Cervical Cytology Screening  10/12/2022   PAP SMEAR-Modifier  10/12/2022    The following portions of the patient's history were reviewed and updated as appropriate: allergies, current medications, past family history, past medical history, past social history, past surgical history and problem list.  Review of Systems Pertinent items are noted in HPI.   Objective:  BP 131/78   Pulse (!) 118   Ht '5\' 1"'$  (1.549 m)   Wt 144 lb 6.4 oz (65.5 kg)   LMP 11/23/2022 (Exact Date)    Breastfeeding No   BMI 27.28 kg/m  CONSTITUTIONAL: Well-developed, well-nourished female in no acute distress.  HENT:  Normocephalic, atraumatic, External right and left ear normal. Oropharynx is clear and moist EYES:  No scleral icterus.  NECK: Normal range of motion, supple, no masses.  Normal thyroid.  SKIN: Skin is warm and dry. No rash noted. Not diaphoretic. No erythema. No pallor. NEUROLOGIC: Alert and oriented to person, place, and time. Normal reflexes, muscle tone coordination. No cranial nerve deficit noted. PSYCHIATRIC: Normal mood and affect. Normal behavior. Normal judgment and thought content. CARDIOVASCULAR: Normal heart rate noted, regular rhythm. 2+ distal pulses. RESPIRATORY: Effort and breath sounds normal, no problems with respiration noted. BREASTS: Symmetric in size. No masses, skin changes, nipple drainage, or lymphadenopathy. ABDOMEN: Soft,  no distention noted.  No tenderness, rebound or guarding.  PELVIC: Normal appearing external genitalia; normal appearing vaginal mucosa and cervix.  No abnormal discharge noted.  Pap smear obtained.  Normal uterine size, no other palpable masses, no uterine or adnexal tenderness. Chaperone present for exam MUSCULOSKELETAL: Normal range of motion.    Assessment and Plan:  1) Annual gynecologic examination with pap smear:  Will follow up results of pap smear and manage accordingly. STI screen also ordered today.  Routine preventative health maintenance measures emphasized.  2) Contraception counseling: Reviewed all forms of birth control options available including abstinence; over the counter/barrier methods; hormonal contraceptive medication including pill, patch, ring,  injection,contraceptive implant; hormonal and nonhormonal IUDs; permanent sterilization options including vasectomy and the various tubal sterilization modalities. Risks and benefits reviewed.  Questions were answered.  Written information was also given to the  patient to review.  Patient desires to use withdrawal and FAM. She will follow up in  PRN  for surveillance.  She was told to call with any further questions, or with any concerns about this method of contraception.  Emphasized use of condoms 100% of the time for STI prevention.  1. Well woman exam with routine gynecological exam Recommended UPT at home if cycle is not normal Discussed regular preventative care - Cytology - PAP( Cross)   Please refer to After Visit Summary for other counseling recommendations.   No follow-ups on file.  Caren Macadam, MD, MPH, ABFM Attending Physician [facility]

## 2022-11-27 LAB — CYTOLOGY - PAP
Adequacy: ABSENT
Chlamydia: NEGATIVE
Comment: NEGATIVE
Comment: NEGATIVE
Comment: NORMAL
Diagnosis: NEGATIVE
Diagnosis: REACTIVE
Neisseria Gonorrhea: NEGATIVE
Trichomonas: NEGATIVE

## 2023-02-03 ENCOUNTER — Ambulatory Visit (INDEPENDENT_AMBULATORY_CARE_PROVIDER_SITE_OTHER): Payer: Medicaid Other | Admitting: Obstetrics and Gynecology

## 2023-02-03 ENCOUNTER — Other Ambulatory Visit: Payer: Self-pay

## 2023-02-03 ENCOUNTER — Encounter: Payer: Self-pay | Admitting: Obstetrics and Gynecology

## 2023-02-03 ENCOUNTER — Other Ambulatory Visit (HOSPITAL_COMMUNITY)
Admission: RE | Admit: 2023-02-03 | Discharge: 2023-02-03 | Disposition: A | Discharge status: Home / Self Care | Payer: Medicaid Other | Source: Ambulatory Visit | Attending: Obstetrics and Gynecology | Admitting: Obstetrics and Gynecology

## 2023-02-03 VITALS — BP 132/91 | HR 101 | Ht 61.0 in | Wt 147.2 lb

## 2023-02-03 DIAGNOSIS — N760 Acute vaginitis: Secondary | ICD-10-CM | POA: Diagnosis not present

## 2023-02-03 DIAGNOSIS — R102 Pelvic and perineal pain: Secondary | ICD-10-CM

## 2023-02-03 DIAGNOSIS — R1024 Suprapubic pain: Secondary | ICD-10-CM

## 2023-02-03 DIAGNOSIS — N3000 Acute cystitis without hematuria: Secondary | ICD-10-CM | POA: Diagnosis not present

## 2023-02-03 DIAGNOSIS — B9689 Other specified bacterial agents as the cause of diseases classified elsewhere: Secondary | ICD-10-CM

## 2023-02-03 DIAGNOSIS — N939 Abnormal uterine and vaginal bleeding, unspecified: Secondary | ICD-10-CM | POA: Diagnosis not present

## 2023-02-03 LAB — POCT PREGNANCY, URINE: Preg Test, Ur: NEGATIVE

## 2023-02-03 NOTE — Progress Notes (Addendum)
   GYNECOLOGY PROGRESS NOTE  History:  27 y.o. G1P1001 presents to [facility] urinary symptoms office today for problem gyn visit. She reports abnormal period.  She denies h/a, dizziness, shortness of breath, n/v, or fever/chills.   LMP: 4/29, reports normal, but did not bleed as heavy. Reports the week before that noticed blood when she wiped, has not noticed since then, no other associating symptoms Duration: 3 days, which is regular for her. Last intercourse: Unprotected sex after cycle ended last week. No birth control, no condoms.  Has not noticed anymore bleeding, reports mild odor, no discharge, dysuria, urgency/frequency.  The following portions of the patient's history were reviewed and updated as appropriate: allergies, current medications, past family history, past medical history, past social history, past surgical history and problem list. Last pap smear on 11/24/22 was normal.  Health Maintenance Due  Topic Date Due   HPV VACCINES (1 - 2-dose series) Never done   COVID-19 Vaccine (3 - 2023-24 season) 05/30/2022     Review of Systems:  Pertinent items are noted in HPI.   Objective:  Physical Exam Blood pressure (!) 132/91, pulse (!) 101, height 5\' 1"  (1.549 m), weight 147 lb 3.2 oz (66.8 kg), last menstrual period 01/26/2023, not currently breastfeeding. VS reviewed, nursing note reviewed,   Constitutional: well developed, well nourished, no distress HEENT: normocephalic CV: normal rate Pulm/chest wall: normal effort Breast Exam: deferred Abdomen: soft, negative for mcburneys, negative rovsings sign Neuro: alert and oriented x 3 Skin: warm, dry Psych: affect normal GU: suprapubic tenderness to palpation, vaginal exam deferred.   Assessment & Plan:  1. Suprapubic pressure 2. Abnormal vaginal bleeding Discussed pregnancy vs vaginal or urinary infection. UPT negative, UA negative, will send for culture. Based on assessment will defer u/s for now. Consider UPT in a couple of  weeks. Will wait for swabs to make plan. - Cervicovaginal ancillary only - HIV antibody (with reflex) - Urine Culture  Follow up as needed, if symptoms persist   Melissa Grates, FNP

## 2023-02-04 ENCOUNTER — Encounter: Payer: Self-pay | Admitting: Certified Nurse Midwife

## 2023-02-04 LAB — CERVICOVAGINAL ANCILLARY ONLY
Bacterial Vaginitis (gardnerella): POSITIVE — AB
Candida Glabrata: NEGATIVE
Candida Vaginitis: NEGATIVE
Chlamydia: NEGATIVE
Comment: NEGATIVE
Comment: NEGATIVE
Comment: NEGATIVE
Comment: NEGATIVE
Comment: NEGATIVE
Comment: NORMAL
Neisseria Gonorrhea: NEGATIVE
Trichomonas: NEGATIVE

## 2023-02-04 LAB — HIV ANTIBODY (ROUTINE TESTING W REFLEX): HIV Screen 4th Generation wRfx: NONREACTIVE

## 2023-02-04 LAB — URINE CULTURE

## 2023-02-04 LAB — RPR: RPR Ser Ql: NONREACTIVE

## 2023-02-04 MED ORDER — METRONIDAZOLE 500 MG PO TABS: 500.0000 mg | ORAL_TABLET | Freq: Two times a day (BID) | ORAL | 0 refills | Status: AC

## 2023-02-04 NOTE — Addendum Note (Signed)
Addended by: Sue Lush on: 02/04/2023 04:47 PM   Modules accepted: Orders

## 2023-02-05 MED ORDER — AMOXICILLIN 500 MG PO CAPS: 500.0000 mg | ORAL_CAPSULE | Freq: Three times a day (TID) | ORAL | 0 refills | Status: AC

## 2023-02-05 NOTE — Addendum Note (Signed)
Addended by: Sue Lush on: 02/05/2023 08:06 AM   Modules accepted: Orders

## 2023-03-14 ENCOUNTER — Encounter (HOSPITAL_BASED_OUTPATIENT_CLINIC_OR_DEPARTMENT_OTHER): Payer: Self-pay | Admitting: Emergency Medicine

## 2023-03-14 ENCOUNTER — Emergency Department (HOSPITAL_BASED_OUTPATIENT_CLINIC_OR_DEPARTMENT_OTHER)
Admission: EM | Admit: 2023-03-14 | Discharge: 2023-03-14 | Disposition: A | Discharge status: Home / Self Care | Payer: Medicaid Other | Attending: Emergency Medicine | Admitting: Emergency Medicine

## 2023-03-14 ENCOUNTER — Other Ambulatory Visit (HOSPITAL_BASED_OUTPATIENT_CLINIC_OR_DEPARTMENT_OTHER): Payer: Self-pay

## 2023-03-14 ENCOUNTER — Inpatient Hospital Stay (EMERGENCY_DEPARTMENT_HOSPITAL)
Admission: AD | Admit: 2023-03-14 | Discharge: 2023-03-14 | Disposition: A | Discharge status: Home / Self Care | Payer: Medicaid Other | Source: Home / Self Care | Attending: Obstetrics & Gynecology | Admitting: Obstetrics & Gynecology

## 2023-03-14 ENCOUNTER — Emergency Department (HOSPITAL_BASED_OUTPATIENT_CLINIC_OR_DEPARTMENT_OTHER): Payer: Medicaid Other

## 2023-03-14 ENCOUNTER — Other Ambulatory Visit: Payer: Self-pay

## 2023-03-14 DIAGNOSIS — R1031 Right lower quadrant pain: Secondary | ICD-10-CM | POA: Diagnosis not present

## 2023-03-14 DIAGNOSIS — Z3202 Encounter for pregnancy test, result negative: Secondary | ICD-10-CM | POA: Insufficient documentation

## 2023-03-14 DIAGNOSIS — R197 Diarrhea, unspecified: Secondary | ICD-10-CM | POA: Insufficient documentation

## 2023-03-14 DIAGNOSIS — R1110 Vomiting, unspecified: Secondary | ICD-10-CM | POA: Insufficient documentation

## 2023-03-14 DIAGNOSIS — B349 Viral infection, unspecified: Secondary | ICD-10-CM | POA: Diagnosis not present

## 2023-03-14 DIAGNOSIS — R509 Fever, unspecified: Secondary | ICD-10-CM | POA: Insufficient documentation

## 2023-03-14 DIAGNOSIS — R109 Unspecified abdominal pain: Secondary | ICD-10-CM | POA: Insufficient documentation

## 2023-03-14 DIAGNOSIS — R1011 Right upper quadrant pain: Secondary | ICD-10-CM | POA: Insufficient documentation

## 2023-03-14 DIAGNOSIS — E876 Hypokalemia: Secondary | ICD-10-CM | POA: Insufficient documentation

## 2023-03-14 DIAGNOSIS — N12 Tubulo-interstitial nephritis, not specified as acute or chronic: Secondary | ICD-10-CM

## 2023-03-14 LAB — CBC WITH DIFFERENTIAL/PLATELET
Abs Immature Granulocytes: 0.08 10*3/uL — ABNORMAL HIGH (ref 0.00–0.07)
Basophils Absolute: 0 10*3/uL (ref 0.0–0.1)
Basophils Relative: 0 %
Eosinophils Absolute: 0 10*3/uL (ref 0.0–0.5)
Eosinophils Relative: 0 %
HCT: 33 % — ABNORMAL LOW (ref 36.0–46.0)
Hemoglobin: 10.8 g/dL — ABNORMAL LOW (ref 12.0–15.0)
Immature Granulocytes: 1 %
Lymphocytes Relative: 10 %
Lymphs Abs: 1.5 10*3/uL (ref 0.7–4.0)
MCH: 30.9 pg (ref 26.0–34.0)
MCHC: 32.7 g/dL (ref 30.0–36.0)
MCV: 94.6 fL (ref 80.0–100.0)
Monocytes Absolute: 2 10*3/uL — ABNORMAL HIGH (ref 0.1–1.0)
Monocytes Relative: 14 %
Neutro Abs: 10.6 10*3/uL — ABNORMAL HIGH (ref 1.7–7.7)
Neutrophils Relative %: 75 %
Platelets: 243 10*3/uL (ref 150–400)
RBC: 3.49 MIL/uL — ABNORMAL LOW (ref 3.87–5.11)
RDW: 13.2 % (ref 11.5–15.5)
WBC: 14.2 10*3/uL — ABNORMAL HIGH (ref 4.0–10.5)
nRBC: 0 % (ref 0.0–0.2)

## 2023-03-14 LAB — COMPREHENSIVE METABOLIC PANEL
ALT: 5 U/L (ref 0–44)
AST: 11 U/L — ABNORMAL LOW (ref 15–41)
Albumin: 4.1 g/dL (ref 3.5–5.0)
Alkaline Phosphatase: 50 U/L (ref 38–126)
Anion gap: 9 (ref 5–15)
BUN: <5 mg/dL — ABNORMAL LOW (ref 6–20)
CO2: 29 mmol/L (ref 22–32)
Calcium: 9.1 mg/dL (ref 8.9–10.3)
Chloride: 95 mmol/L — ABNORMAL LOW (ref 98–111)
Creatinine, Ser: 0.93 mg/dL (ref 0.44–1.00)
GFR, Estimated: >60 mL/min (ref >60)
Glucose, Bld: 102 mg/dL — ABNORMAL HIGH (ref 70–99)
Potassium: 3.1 mmol/L — ABNORMAL LOW (ref 3.5–5.1)
Sodium: 133 mmol/L — ABNORMAL LOW (ref 135–145)
Total Bilirubin: 0.6 mg/dL (ref 0.3–1.2)
Total Protein: 7.9 g/dL (ref 6.5–8.1)

## 2023-03-14 LAB — URINALYSIS, ROUTINE W REFLEX MICROSCOPIC
Bilirubin Urine: NEGATIVE
Glucose, UA: NEGATIVE mg/dL
Ketones, ur: NEGATIVE mg/dL
Nitrite: NEGATIVE
Protein, ur: 30 mg/dL — AB
Specific Gravity, Urine: 1.011 (ref 1.005–1.030)
WBC, UA: >50 WBC/hpf (ref 0–5)
pH: 6 (ref 5.0–8.0)

## 2023-03-14 LAB — POCT PREGNANCY, URINE: Preg Test, Ur: NEGATIVE

## 2023-03-14 LAB — LIPASE, BLOOD: Lipase: <10 U/L — ABNORMAL LOW (ref 11–51)

## 2023-03-14 LAB — PREGNANCY, URINE: Preg Test, Ur: NEGATIVE

## 2023-03-14 MED ORDER — LACTATED RINGERS IV SOLN: INTRAVENOUS | Status: DC

## 2023-03-14 MED ORDER — MORPHINE SULFATE (PF) 4 MG/ML IV SOLN
4.0000 mg | Freq: Once | INTRAVENOUS | Status: AC
  Administered 2023-03-14: 4 mg via INTRAVENOUS
  Filled 2023-03-14: qty 1

## 2023-03-14 MED ORDER — IOHEXOL 300 MG/ML  SOLN
100.0000 mL | Freq: Once | INTRAMUSCULAR | Status: AC | PRN
  Administered 2023-03-14: 80 mL via INTRAVENOUS

## 2023-03-14 MED ORDER — ONDANSETRON HCL 4 MG/2ML IJ SOLN
4.0000 mg | Freq: Once | INTRAMUSCULAR | Status: AC
  Administered 2023-03-14: 4 mg via INTRAVENOUS
  Filled 2023-03-14: qty 2

## 2023-03-14 MED ORDER — OXYCODONE-ACETAMINOPHEN 5-325 MG PO TABS
1.0000 | ORAL_TABLET | Freq: Four times a day (QID) | ORAL | 0 refills | Status: DC | PRN
  Filled 2023-03-14: qty 15, 4d supply, fill #0

## 2023-03-14 MED ORDER — SODIUM CHLORIDE 0.9 % IV SOLN
2.0000 g | INTRAVENOUS | Status: DC
  Administered 2023-03-14: 2 g via INTRAVENOUS
  Filled 2023-03-14: qty 20

## 2023-03-14 MED ORDER — LACTATED RINGERS IV BOLUS
1000.0000 mL | Freq: Once | INTRAVENOUS | Status: AC
  Administered 2023-03-14: 1000 mL via INTRAVENOUS

## 2023-03-14 MED ORDER — ONDANSETRON 8 MG PO TBDP
8.0000 mg | ORAL_TABLET | Freq: Three times a day (TID) | ORAL | 0 refills | Status: DC | PRN
  Filled 2023-03-14: qty 20, 7d supply, fill #0

## 2023-03-14 MED ORDER — ACETAMINOPHEN 325 MG PO TABS
650.0000 mg | ORAL_TABLET | Freq: Once | ORAL | Status: AC | PRN
  Administered 2023-03-14: 650 mg via ORAL
  Filled 2023-03-14: qty 2

## 2023-03-14 MED ORDER — CEFDINIR 300 MG PO CAPS
300.0000 mg | ORAL_CAPSULE | Freq: Two times a day (BID) | ORAL | 0 refills | Status: AC
  Filled 2023-03-14: qty 14, 7d supply, fill #0

## 2023-03-14 NOTE — MAU Provider Note (Signed)
Event Date/Time  First Provider Initiated Contact with Patient 03/14/23 0955     S [name] is a 27 y.o. G1P1001 patient who presents to MAU today with complaint of fever, vomiting, diarrhea and abdominal pain.  All symptoms were new onset 03/11/2023. She is s/p evaluation with [facility] two days ago. She denies chest pain, weakness, dysuria, syncope, activity intolerance. Patient reports recurrent fever. Tmax was 101 this morning. She has attempted management with Motrin, last taken about two days ago. She has not taken Tylenol or tried other treatments for her complaints. She declines medication counseling from CNM.    O BP 118/72 (BP Location: Right Arm)   Pulse (!) 137   Temp 100.3 F (37.9 C) (Oral)   Resp 18   Ht 5\' 1"  (1.549 m)   Wt 65.8 kg   SpO2 98%   BMI 27.42 kg/m   Physical Exam Vitals and nursing note reviewed. Exam conducted with a chaperone present.  Constitutional:      General: She is not in acute distress.    Appearance: She is well-developed. She is not ill-appearing, toxic-appearing or diaphoretic.  Cardiovascular:     Rate and Rhythm: Regular rhythm. Tachycardia present.     Heart sounds: Normal heart sounds.  Pulmonary:     Effort: Pulmonary effort is normal.     Breath sounds: Normal breath sounds.  Abdominal:     General: Abdomen is flat.  Neurological:     Mental Status: She is alert.    A -Medical screening exam complete -Negative UPT in MAU, no GYN complaints -Apology offered to patient that MAU scope of practice does not include today's complaints --EMR reviewed: --Visit with [facility] for same symptoms 03/12/23 --Negative for COVID, Flu A and Flu B  --S/p normal Chest Xray --Active prescription for Zofran issued  P -Discharge from MAU in stable condition -Patient unsure where she plans to go for additional evaluation, discussed Urgent Care vs [facility] -Acute symptoms which would necessitate immediate ED evaluation reviewed by  CNM  F/U --Patient with subsequent same-day encounter at [facility] ED  Clayton Bibles, CNM 03/14/2023 3:07 PM

## 2023-03-14 NOTE — ED Triage Notes (Signed)
Pt has had fevers /general fatigue and achiness, cold sweats. Reports pain around c section scar and lower back. She is worried because she felt like this a year ago when her c section scar was infected. Reports no problems with urination.

## 2023-03-14 NOTE — MAU Note (Signed)
Melissa Guzman is a 27 y.o. at Unknown here in MAU reporting: she has fever, N/V, headache, and abdominal pain since Wednesday.  Reports has taken Motrin for symptoms, hasn't taken since either Thursday or Friday.   LMP: May 2024 Onset of complaint: Wednesday Pain score: 3 Vitals:   03/14/23 0959  BP: 118/72  Pulse: (!) 137  Resp: 18  Temp: 100.3 F (37.9 C)  SpO2: 98%     FHT:NA Lab orders placed from triage:   UPT

## 2023-03-14 NOTE — ED Provider Notes (Signed)
[place] EMERGENCY DEPARTMENT AT [facility] Provider Note   CSN: [id number] Arrival date & time: 03/14/23  1032     History  Chief Complaint  Patient presents with   Fever    Melissa Guzman is a 27 y.o. female.  27 year old female presents with several days of fevers achiness and cold sweats.  Has been seen at urgent care as well as MAU.  Those visits reviewed and patient had a negative strep test as well as negative viral panels done.  She is not pregnant.  Complains of pain to her right mid and lower quadrant.  No associated urinary symptoms.  Pain is characterized as sharp and worse with any movement.      Home Medications Prior to Admission medications   Medication Sig Start Date End Date Taking? Authorizing Provider  acetaminophen (TYLENOL) 500 MG tablet Take 2 tablets (1,000 mg total) by mouth every 8 (eight) hours as needed (pain). 03/05/22   Worthy Rancher, MD  hydrOXYzine (ATARAX) 10 MG tablet Take 1 tablet (10 mg total) by mouth 3 (three) times daily as needed. Patient not taking: Reported on 04/16/2022 04/02/22   Venora Maples, MD  ibuprofen (ADVIL) 600 MG tablet Take 1 tablet (600 mg total) by mouth every 6 (six) hours as needed for moderate pain, cramping, mild pain, headache or fever. 07/03/22   Carlisle Beers, FNP  sertraline (ZOLOFT) 50 MG tablet Take 1 tablet (50 mg total) by mouth daily. Patient not taking: Reported on 04/16/2022 04/02/22   Venora Maples, MD      Allergies    Patient has no known allergies.    Review of Systems   Review of Systems  All other systems reviewed and are negative.   Physical Exam Updated Vital Signs BP 134/83   Pulse (!) 121   Temp 99.6 F (37.6 C) (Oral)   Resp 16   Ht 1.549 m (5\' 1" )   Wt 65.8 kg   SpO2 99%   BMI 27.40 kg/m  Physical Exam Vitals and nursing note reviewed.  Constitutional:      General: She is not in acute distress.    Appearance: Normal appearance. She is well-developed. She  is not toxic-appearing.  HENT:     Head: Normocephalic and atraumatic.  Eyes:     General: Lids are normal.     Conjunctiva/sclera: Conjunctivae normal.     Pupils: Pupils are equal, round, and reactive to light.  Neck:     Thyroid: No thyroid mass.     Trachea: No tracheal deviation.  Cardiovascular:     Rate and Rhythm: Normal rate and regular rhythm.     Heart sounds: Normal heart sounds. No murmur heard.    No gallop.  Pulmonary:     Effort: Pulmonary effort is normal. No respiratory distress.     Breath sounds: Normal breath sounds. No stridor. No decreased breath sounds, wheezing, rhonchi or rales.  Abdominal:     General: There is no distension.     Palpations: Abdomen is soft.     Tenderness: There is abdominal tenderness in the right upper quadrant and right lower quadrant. There is guarding. There is no rebound.    Musculoskeletal:        General: No tenderness. Normal range of motion.     Cervical back: Normal range of motion and neck supple.  Skin:    General: Skin is warm and dry.     Findings: No abrasion or rash.  Neurological:     Mental Status: She is alert and oriented to person, place, and time. Mental status is at baseline.     GCS: GCS eye subscore is 4. GCS verbal subscore is 5. GCS motor subscore is 6.     Cranial Nerves: No cranial nerve deficit.     Sensory: No sensory deficit.     Motor: Motor function is intact.  Psychiatric:        Attention and Perception: Attention normal.        Speech: Speech normal.        Behavior: Behavior normal.    ED Results / Procedures / Treatments   Labs (all labs ordered are listed, but only abnormal results are displayed) Labs Reviewed - No data to display  EKG None  Radiology No results found.  Procedures Procedures    Medications Ordered in ED Medications  lactated ringers infusion (has no administration in time range)    ED Course/ Medical Decision Making/ A&P                              Medical Decision Making Amount and/or Complexity of Data Reviewed Labs: ordered. Radiology: ordered.  Risk OTC drugs. Prescription drug management.   Patient presented with flank pain concern for possible appendicitis versus pyelonephritis.  Urinalysis does show infection.  CT scan consistent with pyelonephritis.  Given 2 g Rocephin here.  She was febrile and treated with Tylenol and has defervesced.  Feels better at this time.  Plan will be to be discharged on Omnicef, given analgesics.  Patient comfortable with this.        Final Clinical Impression(s) / ED Diagnoses Final diagnoses:  None    Rx / DC Orders ED Discharge Orders     None         Lorre Nick, MD 03/14/23 1434

## 2023-03-17 ENCOUNTER — Encounter: Payer: Self-pay | Admitting: Certified Nurse Midwife

## 2023-03-18 ENCOUNTER — Telehealth: Payer: Self-pay

## 2023-03-18 NOTE — Telephone Encounter (Signed)
Patient questioned if she provided with the correct information from her CT scan that she had in the ED.  I explained to pt that per note the provider informed her of the CT results and that she was treated accordingly.  I informed pt that she would need to contact the ED if she had any other questions.   Leonette Nutting  03/18/23

## 2023-04-20 ENCOUNTER — Encounter: Payer: Self-pay | Admitting: Certified Nurse Midwife

## 2023-04-20 DIAGNOSIS — N898 Other specified noninflammatory disorders of vagina: Secondary | ICD-10-CM

## 2023-04-20 MED ORDER — METRONIDAZOLE 500 MG PO TABS: 500.0000 mg | ORAL_TABLET | Freq: Two times a day (BID) | ORAL | 0 refills | Status: DC

## 2023-04-20 NOTE — BH Specialist Note (Unsigned)
Integrated Behavioral Health via Telemedicine Visit  04/21/2023 Melissa Guzman [id number]  Number of Integrated Behavioral Health Clinician visits: 1- Initial Visit  Session Start time: 0915   Session End time: 0948  Total time in minutes: 33   Referring Provider: Edd Arbour, CNM Patient/Family location: Home [facility] Provider location: [facility]  All persons participating in visit: [name] and Melissa Guzman   Types of Service: Individual psychotherapy and Video visit  I connected with Melissa Guzman and/or Melissa Guzman's  n/a  via  Telephone or Video Enabled Telemedicine Application  (Video is Caregility application) and verified that I am speaking with the correct person using two identifiers. Discussed confidentiality: Yes   I discussed the limitations of telemedicine and the availability of in person appointments.  Discussed there is a possibility of technology failure and discussed alternative modes of communication if that failure occurs.  I discussed that engaging in this telemedicine visit, they consent to the provision of behavioral healthcare and the services will be billed under their insurance.  Patient and/or legal guardian expressed understanding and consented to Telemedicine visit: Yes   Presenting Concerns: Patient and/or family reports the following symptoms/concerns: Increased anxiety, waking up with panic and heart racing, poor appetite, poor sleep quality, worry, poor concentration, restlessness, attributed partially to current life stress (sister moved out; adjusting to being a single mother with limited support); pt requests to restart BH medication (took both Zoloft and Hydroxyzine for less than a week in last pregnancy, stopped taking due to worry about safety in pregnancy).  Duration of problem: Increasing postpartum; Severity of problem:  moderately severe  Patient and/or Family's  Strengths/Protective Factors: Concrete supports in place (healthy food, safe environments, etc.) and Sense of purpose  Goals Addressed: Patient will:  Reduce symptoms of: anxiety, depression, insomnia, and stress   Increase knowledge and/or ability of: healthy habits and stress reduction   Demonstrate ability to: Increase healthy adjustment to current life circumstances and Increase motivation to adhere to plan of care  Progress towards Goals: Ongoing  Interventions: Interventions utilized:  Motivational Interviewing and Psychoeducation and/or Health Education Standardized Assessments completed: GAD-7 and PHQ 9  Patient and/or Family Response: Patient agrees with treatment plan.   Assessment: Patient currently experiencing Generalized anxiety disorder.   Patient may benefit from psychoeducation and brief therapeutic interventions regarding coping with symptoms of anxiety, depression, life stress .  Plan: Follow up with behavioral health clinician on : Two weeks Behavioral recommendations:  -Consider adding iron-rich foods to diet, as discussed, for the next two weeks -Consider establishing care with ongoing therapist of choice (some options on After Visit Summary) -Contact new assigned PCP [facility]) and schedule initial appointment -Continue to consider alternate work options for greater work-life balance Referral(s): Integrated [employer] (In Clinic) and Counselor  I discussed the assessment and treatment plan with the patient and/or parent/guardian. They were provided an opportunity to ask questions and all were answered. They agreed with the plan and demonstrated an understanding of the instructions.   They were advised to call back or seek an in-person evaluation if the symptoms worsen or if the condition fails to improve as anticipated.  Melissa Lips, LCSW     04/21/2023    9:17 AM 02/03/2023    9:28 AM 03/31/2022    2:23 PM 02/26/2022    3:33  PM 02/10/2022    4:31 PM  Depression screen PHQ 2/9  Decreased Interest 1  1 1 3 1   Down, Depressed, Hopeless 1 0 1 2 0  PHQ - 2 Score 2 1 2 5 1   Altered sleeping 3 0 0 1 0  Tired, decreased energy 1 1 0 3 1  Change in appetite 3 0 2 1 3   Feeling bad or failure about yourself  1 0 0 1 0  Trouble concentrating 3 0 0 1 0  Moving slowly or fidgety/restless 3 0 0 0 0  Suicidal thoughts 0 0 0 0 0  PHQ-9 Score 16 2 4 12 5   Difficult doing work/chores  Not difficult at all         04/21/2023    9:20 AM 02/03/2023    9:29 AM 03/31/2022    2:27 PM 02/26/2022    3:33 PM  GAD 7 : Generalized Anxiety Score  Nervous, Anxious, on Edge 3 0 3 2  Control/stop worrying 3 0 0 2  Worry too much - different things 3 0 2 2  Trouble relaxing 1 0 1 2  Restless 1 0 0 1  Easily annoyed or irritable 1 0 1 2  Afraid - awful might happen 1 0 1 2  Total GAD 7 Score 13 0 8 13

## 2023-04-21 ENCOUNTER — Ambulatory Visit (INDEPENDENT_AMBULATORY_CARE_PROVIDER_SITE_OTHER): Payer: Medicaid Other | Admitting: Clinical

## 2023-04-21 DIAGNOSIS — F411 Generalized anxiety disorder: Secondary | ICD-10-CM | POA: Diagnosis not present

## 2023-04-21 NOTE — Patient Instructions (Signed)
[facility] [address] [place], [place] [address] [phone] (main office) [phone] office)    [facility], [address] [place], [place]  [phone] Fax: [phone] [email or web address] *Interpreters available *Accepts all insurance and uninsured for Urgent Care needs *Accepts Medicaid and uninsured for outpatient treatment   [facility]   Mon-Fri: 8am-5pm [address], [place], [place] [phone](UXNAT); [phone](fax) [email or web address]  *Accepts Medicare  [facility] [place], Fri: 8am-4pm [address], [place], [place] [address] [phone] (phone); [phone] (fax) [email or web address]  *Accepts Medicare  [facility] Mon-Fri: 9am-5pm  [address], [place], [place] [phone] (phone); [phone]  [email or web address]  *Accepts Medicaid  [facility], 8:30am-12pm/1pm-2:30pm [address] [place], [place] [phone] (phone); [phone] (fax) [email or web address]  *Accepts Medicaid, sliding-scale*Bilingual services available  [facility] Mon-Fri, 8am-7pm [address], [place], [place]  [phone](UMPNT); [phone](fax) [email or web address].[email or web address]  *Accepts Medicaid *Bilingual services available  [facility] Mon-Fri: 8am-5pm, Saturday by appointment only [address] [place], [place], [place] [phone] (phone); [phone] (fax) [email or web address]   [facility] [address], [place], [place] [phone] [email or web address]  *Free & reduced services for uninsured and underinsured individuals *Bilingual services for Spanish-speaking clients 21 and under  [facility] Mon-Fri:  9am-5:30pm [address], [place], [place] [phone] (phone), [phone] (fax) After hours crisis line: [phone] [email or web address]  *Accepts Medicare and Medicaid  [employer], 8am-6pm [address], [place], [place]  [phone] (phone); [phone] (fax) [email or web address]  *Subsidized costs available  [facility] Mon-Fri: 8am-4pm [address], [place], [place] [phone](JSHFW); [phone](fax) [email or web address]  *Accepts Medicaid  [facility] Same day access hours: Mon-Fri, 8:30-3pm Crisis hours: Mon-Fri, 8am-5pm [address] [place], [place] [phone]  [facility] Same day access hours: Mon-Fri, 8:30-3pm Crisis hours: Mon-Fri, 8am-8pm [address], [place], [place] [phone] (phone); [phone] (fax) [email or web address].[email or web address]  *Accepts Medicaid and Medicare  [facility] [place] [place], Fri: 9am-9pm Tues, Thurs: 9am-6pm [address] [place], [place], [place]  [phone] (phone); [phone] (fax) [email or web address]  *(Accepts Medicare and Medicaid; payment plans available)*Bilingual services available  [facility] [address], [place], [place] [phone] (phone); [phone] (fax) [email or web address]   [facility] [address], [place], [place]  [phone]  [email or web address]  *Bilingual services available  [facility] Mon-Thurs: 8am-8pm [address], [place], [place] [phone] (phone); [phone] (fax) [email or web address]  *Accepts Medicaid*Bilingual services available  [facility] [address] [place], [place] [phone] (phone) [email or web address]  *Accepts Medicaid *Bilingual services available  [facility] Mon-Fri, 9am-4:45pm [address], [place], [place] [phone] (phone); [phone] (fax) [email or web address]  *Accepts Medicare  [facility] Mon-Thurs: 8:30-8pm, Fri: 8:30am-7pm [address] [place], [place] (3rd floor) [phone] (phone); [phone] (fax) [email or web address]  *Accepts Medicaid; income-based reduced rates available  [facility] Mon-Fri: 8am-5pm [address], [place], [place] [address] [phone] (phone); [phone] (fax) [email or web address]  *Accepts Medicaid*Bilingual services available

## 2023-04-22 NOTE — BH Specialist Note (Signed)
Integrated Behavioral Health via Telemedicine Visit  05/05/2023 Melissa Guzman [id number]  Number of Integrated Behavioral Health Clinician visits: 1- Initial Visit  Session Start time: 0915   Session End time: 0948  Total time in minutes: 33   Referring Provider: Federico Flake, MD Patient/Family location: [facility] Provider location: [facility] for [facility]  All persons participating in visit: [name] and [place] Daishon Chui   Types of Service: Individual psychotherapy and Telephone visit  I connected with Kasiya Stantz and/or Jameelah Nott's  n/a  via  Telephone or Video Enabled Telemedicine Application  (Video is Caregility application) and verified that I am speaking with the correct person using two identifiers. Discussed confidentiality: Yes   I discussed the limitations of telemedicine and the availability of in person appointments.  Discussed there is a possibility of technology failure and discussed alternative modes of communication if that failure occurs.  I discussed that engaging in this telemedicine visit, they consent to the provision of behavioral healthcare and the services will be billed under their insurance.  Patient and/or legal guardian expressed understanding and consented to Telemedicine visit: Yes   Presenting Concerns: Patient and/or family reports the following symptoms/concerns: Began taking Zoloft, working best if taken right before sleep with sleep improvement, "calmed down a bit"; eating well once/day with no negative side effects; noticing feeling  "out of my body, zoned out" at times, but overall mood improvement. Pt interested in establishing care with both PCP and ongoing therapist.  Duration of problem: Postpartum increase; Severity of problem:  moderately severe  Patient and/or Family's Strengths/Protective Factors: Social connections, Concrete supports in place (healthy food, safe environments,  etc.), and Sense of purpose  Goals Addressed: Patient will:  Reduce symptoms of: anxiety, depression, insomnia, and stress   Increase knowledge and/or ability of: healthy habits   Demonstrate ability to: Increase motivation to adhere to plan of care  Progress towards Goals: Ongoing  Interventions: Interventions utilized:  Medication Monitoring and Supportive Reflection Standardized Assessments completed: Not Needed  Patient and/or Family Response: Patient agrees with treatment plan.   Assessment: Patient currently experiencing generalized anxiety disorder.   Patient may benefit from continued therapeutic intervention  .  Plan: Follow up with behavioral health clinician on : Two weeks Behavioral recommendations:  -Continue taking Zoloft and Atarax as prescribed -Continue plan to establish care with ongoing therapist of choice -Contact [facility] to find out about assigned PCP/Changes to PCP as needed -Continue considering alternative work options to improve work-life balance -Continue spending time with supportive friends/family as needed Referral(s): Integrated [employer] (In Clinic) and Counselor  I discussed the assessment and treatment plan with the patient and/or parent/guardian. They were provided an opportunity to ask questions and all were answered. They agreed with the plan and demonstrated an understanding of the instructions.   They were advised to call back or seek an in-person evaluation if the symptoms worsen or if the condition fails to improve as anticipated.  Rae Lips, LCSW     04/21/2023    9:17 AM 02/03/2023    9:28 AM 03/31/2022    2:23 PM 02/26/2022    3:33 PM 02/10/2022    4:31 PM  Depression screen PHQ 2/9  Decreased Interest 1 1 1 3 1   Down, Depressed, Hopeless 1 0 1 2 0  PHQ - 2 Score 2 1 2 5 1   Altered sleeping 3 0 0 1 0  Tired, decreased energy 1  1 0 3 1  Change in appetite 3 0 2 1 3   Feeling bad or failure  about yourself  1 0 0 1 0  Trouble concentrating 3 0 0 1 0  Moving slowly or fidgety/restless 3 0 0 0 0  Suicidal thoughts 0 0 0 0 0  PHQ-9 Score 16 2 4 12 5   Difficult doing work/chores  Not difficult at all         04/21/2023    9:20 AM 02/03/2023    9:29 AM 03/31/2022    2:27 PM 02/26/2022    3:33 PM  GAD 7 : Generalized Anxiety Score  Nervous, Anxious, on Edge 3 0 3 2  Control/stop worrying 3 0 0 2  Worry too much - different things 3 0 2 2  Trouble relaxing 1 0 1 2  Restless 1 0 0 1  Easily annoyed or irritable 1 0 1 2  Afraid - awful might happen 1 0 1 2  Total GAD 7 Score 13 0 8 13

## 2023-04-24 ENCOUNTER — Encounter: Payer: Self-pay | Admitting: Family Medicine

## 2023-04-24 ENCOUNTER — Other Ambulatory Visit: Payer: Self-pay | Admitting: Family Medicine

## 2023-04-24 ENCOUNTER — Encounter: Payer: Self-pay | Admitting: Certified Nurse Midwife

## 2023-04-24 DIAGNOSIS — F53 Postpartum depression: Secondary | ICD-10-CM

## 2023-04-24 MED ORDER — SERTRALINE HCL 50 MG PO TABS: 50.0000 mg | ORAL_TABLET | Freq: Every day | ORAL | 2 refills | Status: DC

## 2023-04-24 MED ORDER — HYDROXYZINE HCL 10 MG PO TABS: 10.0000 mg | ORAL_TABLET | Freq: Three times a day (TID) | ORAL | 2 refills | Status: DC | PRN

## 2023-04-24 NOTE — Progress Notes (Signed)
Notified by [facility] clinician that patient desires to restart meds, rx sent, she has PCP appt scheduled per [facility].

## 2023-05-05 ENCOUNTER — Ambulatory Visit (INDEPENDENT_AMBULATORY_CARE_PROVIDER_SITE_OTHER): Payer: Medicaid Other | Admitting: Clinical

## 2023-05-05 DIAGNOSIS — F411 Generalized anxiety disorder: Secondary | ICD-10-CM

## 2023-05-05 NOTE — BH Specialist Note (Signed)
Requests reschedule due to unexpected work schedule changes after death in family.

## 2023-05-19 ENCOUNTER — Ambulatory Visit: Payer: Medicaid Other | Admitting: Clinical

## 2023-05-19 DIAGNOSIS — Z91199 Patient's noncompliance with other medical treatment and regimen due to unspecified reason: Secondary | ICD-10-CM

## 2023-05-19 NOTE — BH Specialist Note (Signed)
Integrated Behavioral Health via Telemedicine Visit  05/19/2023 Melissa Guzman [id number]  Less than 5 minutes by phone; requests to reschedule due to unexpected schedule conflict/back to work and currently at [employer] to reapply for EBT.   Rae Lips, LCSW     04/21/2023    9:17 AM 02/03/2023    9:28 AM 03/31/2022    2:23 PM 02/26/2022    3:33 PM 02/10/2022    4:31 PM  Depression screen PHQ 2/9  Decreased Interest 1 1 1 3 1   Down, Depressed, Hopeless 1 0 1 2 0  PHQ - 2 Score 2 1 2 5 1   Altered sleeping 3 0 0 1 0  Tired, decreased energy 1 1 0 3 1  Change in appetite 3 0 2 1 3   Feeling bad or failure about yourself  1 0 0 1 0  Trouble concentrating 3 0 0 1 0  Moving slowly or fidgety/restless 3 0 0 0 0  Suicidal thoughts 0 0 0 0 0  PHQ-9 Score 16 2 4 12 5   Difficult doing work/chores  Not difficult at all         04/21/2023    9:20 AM 02/03/2023    9:29 AM 03/31/2022    2:27 PM 02/26/2022    3:33 PM  GAD 7 : Generalized Anxiety Score  Nervous, Anxious, on Edge 3 0 3 2  Control/stop worrying 3 0 0 2  Worry too much - different things 3 0 2 2  Trouble relaxing 1 0 1 2  Restless 1 0 0 1  Easily annoyed or irritable 1 0 1 2  Afraid - awful might happen 1 0 1 2  Total GAD 7 Score 13 0 8 13

## 2023-05-26 ENCOUNTER — Other Ambulatory Visit: Payer: Self-pay

## 2023-05-26 ENCOUNTER — Observation Stay (HOSPITAL_BASED_OUTPATIENT_CLINIC_OR_DEPARTMENT_OTHER)
Admission: EM | Admit: 2023-05-26 | Discharge: 2023-05-28 | Disposition: A | Discharge status: Home / Self Care | Payer: Medicaid Other | Attending: Emergency Medicine | Admitting: Emergency Medicine

## 2023-05-26 ENCOUNTER — Emergency Department (HOSPITAL_BASED_OUTPATIENT_CLINIC_OR_DEPARTMENT_OTHER): Payer: Medicaid Other

## 2023-05-26 DIAGNOSIS — F419 Anxiety disorder, unspecified: Secondary | ICD-10-CM | POA: Diagnosis present

## 2023-05-26 DIAGNOSIS — D563 Thalassemia minor: Secondary | ICD-10-CM | POA: Diagnosis not present

## 2023-05-26 DIAGNOSIS — R10814 Left lower quadrant abdominal tenderness: Secondary | ICD-10-CM | POA: Diagnosis present

## 2023-05-26 DIAGNOSIS — N10 Acute tubulo-interstitial nephritis: Secondary | ICD-10-CM

## 2023-05-26 DIAGNOSIS — Z8616 Personal history of COVID-19: Secondary | ICD-10-CM | POA: Diagnosis not present

## 2023-05-26 DIAGNOSIS — E876 Hypokalemia: Principal | ICD-10-CM

## 2023-05-26 DIAGNOSIS — Z1152 Encounter for screening for COVID-19: Secondary | ICD-10-CM | POA: Insufficient documentation

## 2023-05-26 DIAGNOSIS — I10 Essential (primary) hypertension: Secondary | ICD-10-CM

## 2023-05-26 DIAGNOSIS — N179 Acute kidney failure, unspecified: Secondary | ICD-10-CM

## 2023-05-26 DIAGNOSIS — R109 Unspecified abdominal pain: Principal | ICD-10-CM

## 2023-05-26 DIAGNOSIS — K8020 Calculus of gallbladder without cholecystitis without obstruction: Secondary | ICD-10-CM | POA: Diagnosis not present

## 2023-05-26 DIAGNOSIS — R300 Dysuria: Secondary | ICD-10-CM | POA: Diagnosis not present

## 2023-05-26 DIAGNOSIS — Z79899 Other long term (current) drug therapy: Secondary | ICD-10-CM | POA: Diagnosis not present

## 2023-05-26 DIAGNOSIS — R000 Tachycardia, unspecified: Secondary | ICD-10-CM | POA: Diagnosis not present

## 2023-05-26 DIAGNOSIS — F32A Depression, unspecified: Secondary | ICD-10-CM | POA: Insufficient documentation

## 2023-05-26 DIAGNOSIS — N390 Urinary tract infection, site not specified: Secondary | ICD-10-CM | POA: Diagnosis not present

## 2023-05-26 HISTORY — DX: Urinary tract infection, site not specified: N39.0

## 2023-05-26 LAB — URINALYSIS, W/ REFLEX TO CULTURE (INFECTION SUSPECTED)
Bacteria, UA: NONE SEEN
Glucose, UA: NEGATIVE mg/dL
Ketones, ur: 15 mg/dL — AB
Nitrite: POSITIVE — AB
Protein, ur: 100 mg/dL — AB
Specific Gravity, Urine: 1.037 — ABNORMAL HIGH (ref 1.005–1.030)
pH: 6 (ref 5.0–8.0)

## 2023-05-26 LAB — CBC WITH DIFFERENTIAL/PLATELET
Abs Immature Granulocytes: 0.03 10*3/uL (ref 0.00–0.07)
Basophils Absolute: 0 10*3/uL (ref 0.0–0.1)
Basophils Relative: 0 %
Eosinophils Absolute: 0 10*3/uL (ref 0.0–0.5)
Eosinophils Relative: 0 %
HCT: 37.7 % (ref 36.0–46.0)
Hemoglobin: 12.6 g/dL (ref 12.0–15.0)
Immature Granulocytes: 0 %
Lymphocytes Relative: 27 %
Lymphs Abs: 2 10*3/uL (ref 0.7–4.0)
MCH: 31.5 pg (ref 26.0–34.0)
MCHC: 33.4 g/dL (ref 30.0–36.0)
MCV: 94.3 fL (ref 80.0–100.0)
Monocytes Absolute: 0.7 10*3/uL (ref 0.1–1.0)
Monocytes Relative: 9 %
Neutro Abs: 4.5 10*3/uL (ref 1.7–7.7)
Neutrophils Relative %: 64 %
Platelets: 236 10*3/uL (ref 150–400)
RBC: 4 MIL/uL (ref 3.87–5.11)
RDW: 14.3 % (ref 11.5–15.5)
WBC: 7.2 10*3/uL (ref 4.0–10.5)
nRBC: 0.3 % — ABNORMAL HIGH (ref 0.0–0.2)

## 2023-05-26 LAB — COMPREHENSIVE METABOLIC PANEL
ALT: 17 U/L (ref 0–44)
AST: 55 U/L — ABNORMAL HIGH (ref 15–41)
Albumin: 4.8 g/dL (ref 3.5–5.0)
Alkaline Phosphatase: 58 U/L (ref 38–126)
Anion gap: 14 (ref 5–15)
BUN: 10 mg/dL (ref 6–20)
CO2: 31 mmol/L (ref 22–32)
Calcium: 10.1 mg/dL (ref 8.9–10.3)
Chloride: 91 mmol/L — ABNORMAL LOW (ref 98–111)
Creatinine, Ser: 1.24 mg/dL — ABNORMAL HIGH (ref 0.44–1.00)
GFR, Estimated: >60 mL/min (ref >60)
Glucose, Bld: 116 mg/dL — ABNORMAL HIGH (ref 70–99)
Potassium: 2.6 mmol/L — CL (ref 3.5–5.1)
Sodium: 136 mmol/L (ref 135–145)
Total Bilirubin: 0.6 mg/dL (ref 0.3–1.2)
Total Protein: 8.7 g/dL — ABNORMAL HIGH (ref 6.5–8.1)

## 2023-05-26 LAB — RAPID URINE DRUG SCREEN, HOSP PERFORMED
Amphetamines: NOT DETECTED
Barbiturates: NOT DETECTED
Benzodiazepines: NOT DETECTED
Cocaine: NOT DETECTED
Opiates: NOT DETECTED
Tetrahydrocannabinol: POSITIVE — AB

## 2023-05-26 LAB — PREGNANCY, URINE: Preg Test, Ur: NEGATIVE

## 2023-05-26 LAB — T4, FREE: Free T4: 0.75 ng/dL (ref 0.61–1.12)

## 2023-05-26 LAB — TSH: TSH: 2.19 u[IU]/mL (ref 0.350–4.500)

## 2023-05-26 LAB — TROPONIN I (HIGH SENSITIVITY): Troponin I (High Sensitivity): 12 ng/L (ref <18)

## 2023-05-26 LAB — SARS CORONAVIRUS 2 BY RT PCR: SARS Coronavirus 2 by RT PCR: NEGATIVE

## 2023-05-26 MED ORDER — FLUTICASONE PROPIONATE 50 MCG/ACT NA SUSP
2.0000 | Freq: Every day | NASAL | Status: DC
  Administered 2023-05-27 – 2023-05-28 (×3): 2 via NASAL
  Filled 2023-05-26: qty 16

## 2023-05-26 MED ORDER — FENTANYL CITRATE PF 50 MCG/ML IJ SOSY
50.0000 ug | PREFILLED_SYRINGE | Freq: Once | INTRAMUSCULAR | Status: AC
  Administered 2023-05-26: 50 ug via INTRAVENOUS
  Filled 2023-05-26: qty 1

## 2023-05-26 MED ORDER — SODIUM CHLORIDE 0.9 % IV BOLUS
1000.0000 mL | Freq: Once | INTRAVENOUS | Status: AC
  Administered 2023-05-26: 1000 mL via INTRAVENOUS

## 2023-05-26 MED ORDER — POTASSIUM CHLORIDE CRYS ER 20 MEQ PO TBCR: 20.0000 meq | EXTENDED_RELEASE_TABLET | Freq: Two times a day (BID) | ORAL | 0 refills | Status: DC

## 2023-05-26 MED ORDER — ONDANSETRON HCL 4 MG/2ML IJ SOLN
4.0000 mg | Freq: Once | INTRAMUSCULAR | Status: AC
  Administered 2023-05-26: 4 mg via INTRAVENOUS
  Filled 2023-05-26: qty 2

## 2023-05-26 MED ORDER — ONDANSETRON HCL 4 MG/2ML IJ SOLN
4.0000 mg | Freq: Four times a day (QID) | INTRAMUSCULAR | Status: DC | PRN
  Administered 2023-05-26: 4 mg via INTRAVENOUS
  Filled 2023-05-26: qty 2

## 2023-05-26 MED ORDER — HYDROMORPHONE HCL 1 MG/ML IJ SOLN
0.5000 mg | INTRAMUSCULAR | Status: DC | PRN
  Administered 2023-05-26: 0.5 mg via INTRAVENOUS
  Filled 2023-05-26 (×2): qty 1

## 2023-05-26 MED ORDER — POTASSIUM CHLORIDE IN NACL 20-0.9 MEQ/L-% IV SOLN
INTRAVENOUS | Status: AC
  Filled 2023-05-26 (×2): qty 1000

## 2023-05-26 MED ORDER — SODIUM CHLORIDE 0.9 % IV SOLN
1.0000 g | Freq: Once | INTRAVENOUS | Status: AC
  Administered 2023-05-26: 1 g via INTRAVENOUS
  Filled 2023-05-26: qty 10

## 2023-05-26 MED ORDER — POTASSIUM CHLORIDE 20 MEQ PO PACK
40.0000 meq | PACK | Freq: Once | ORAL | Status: AC
  Administered 2023-05-26: 40 meq via ORAL
  Filled 2023-05-26: qty 2

## 2023-05-26 MED ORDER — LABETALOL HCL 5 MG/ML IV SOLN: 10.0000 mg | INTRAVENOUS | Status: DC | PRN

## 2023-05-26 MED ORDER — HYDRALAZINE HCL 20 MG/ML IJ SOLN
20.0000 mg | Freq: Once | INTRAMUSCULAR | Status: AC
  Administered 2023-05-26: 20 mg via INTRAVENOUS
  Filled 2023-05-26: qty 1

## 2023-05-26 MED ORDER — POTASSIUM CHLORIDE 10 MEQ/100ML IV SOLN
10.0000 meq | Freq: Once | INTRAVENOUS | Status: AC
  Administered 2023-05-26: 10 meq via INTRAVENOUS
  Filled 2023-05-26: qty 100

## 2023-05-26 NOTE — ED Notes (Signed)
Provider at bedside speaking with pt.

## 2023-05-26 NOTE — ED Notes (Signed)
Report called to Gigi, RN, no further questions at this time, awaiting [facility] for transportation.

## 2023-05-26 NOTE — ED Notes (Signed)
Patient transported to CT 

## 2023-05-26 NOTE — ED Notes (Signed)
Provider returned to bedside at this time.

## 2023-05-26 NOTE — ED Notes (Signed)
Patient resting quietly in stretcher, respirations even, unlabored, no acute distress noted. Provided pt with cranberry juice, patient very eager to leave. Patient aware we are awaiting results of CT scan for disposition.

## 2023-05-26 NOTE — ED Provider Notes (Signed)
Patient taken in sign out at shift change.  This patient presents to the ED for concern of bodyaches, chills fatigue, sweats, this involves an extensive number of treatment options, and is a complaint that carries with it a high risk of complications and morbidity.  Recurrent pyelo, viral illness, nephrosis/nephritis, thyrotoxicosis, withdrawal, serotonin syndrome   Co morbidities: Recent pregnancy (3 mos pp), hypertension,   Social Determinants of Health:  . SDOH Screenings   Food Insecurity: No Food Insecurity (05/27/2023)  Housing: Low Risk  (05/27/2023)  Transportation Needs: No Transportation Needs (05/27/2023)  Utilities: Not At Risk (05/27/2023)  Depression (PHQ2-9): High Risk (04/21/2023)  Social Connections: Unknown (02/11/2022)   Received from [facility]  Tobacco Use: Low Risk  (03/14/2023)     Additional history:  {Additional history obtained from emr   Lab Tests:  I Ordered, and personally interpreted labs.  The pertinent results include:   Severe hypokalemia (2.6) associated abnormal EKG  AKI TSH wnl UA- pyuria, hematuria, proteinuria- no bacteria seen- culture sent   Imaging Studies:  I personally visualized and interpreted the images using our PACS system. Acute findings include:  No acute findings   Cardiac Monitoring/ECG:  The patient was maintained on a cardiac monitor.  I personally viewed and interpreted the cardiac monitored which showed an underlying rhythm of:   EKG shows sinus tachycardia at a rate of 104 with sig st seg depression  Medicines ordered and prescription drug management:  I ordered medication including  Fluids, Pain meds, Rocehpin Iv/ PO potassium Hydralazine  for UTI hypertension, pain, hypokalemia Reevaluation of the patient after these medicines showed that the patient improved I have reviewed the patients home medicines and have made adjustments as needed  Test Considered:    Critical  Interventions:  .Critical Care  Performed by: Arthor Captain, PA-C Authorized by: Arthor Captain, PA-C   Critical care provider statement:    Critical care time (minutes):  30   Critical care time was exclusive of:  Separately billable procedures and treating other patients   Critical care was necessary to treat or prevent imminent or life-threatening deterioration of the following conditions:  Renal failure, metabolic crisis and dehydration   Critical care was time spent personally by me on the following activities:  Development of treatment plan with patient or surrogate, discussions with consultants, evaluation of patient's response to treatment, examination of patient, ordering and review of laboratory studies, ordering and review of radiographic studies, ordering and performing treatments and interventions, pulse oximetry, re-evaluation of patient's condition and review of old charts    Consultations Obtained: Dr. Antionette Char for admission  Problem List / ED Course:     ICD-10-CM   1. Hypokalemia  E87.6     2. Accelerated hypertension  I10     3. Urinary tract infection with hematuria, site unspecified  N39.0    R31.9     4. AKI (acute kidney injury) (HCC)  N17.9       MDM: patient will need admission, young postpartum mother with multiple acute findings. Needs treatment for uti, htn, potassium.   Dispostion:  After consideration of the diagnostic results and the patients response to treatment, I feel that the patent would benefit from admission.    Arthor Captain, PA-C 05/28/23 1441    Terald Sleeper, MD 05/28/23 [phone]

## 2023-05-26 NOTE — ED Triage Notes (Addendum)
Pt to ED stating she thinks she has a UTI. Reports she feels like she did 1 month ago when she had a kidney infection. Reports body aches , fever. Pt hypertensive and tachycardic in triage

## 2023-05-26 NOTE — ED Provider Notes (Signed)
[place] EMERGENCY DEPARTMENT AT [facility] Provider Note   CSN: [id number] Arrival date & time: 05/26/23  1409     History  Chief Complaint  Patient presents with   Generalized Body Aches    Melissa Guzman is a 27 y.o. female with past medical history significant for anemia presents to the ED with concerns of a UTI.  Patient states that she has had lower abdominal pain, dysuria, and left-sided flank pain.  She states this feels like her previous episode of pyelonephritis.  She has also had subjective fever and bodyaches.  She reports significant nausea and vomiting yesterday, but this has somewhat improved today.  Denies difficulty urinating, decreased urine, or hematuria.       Home Medications Prior to Admission medications   Medication Sig Start Date End Date Taking? Authorizing Provider  acetaminophen (TYLENOL) 500 MG tablet Take 2 tablets (1,000 mg total) by mouth every 8 (eight) hours as needed (pain). 03/05/22  Yes Worthy Rancher, MD  hydrOXYzine (ATARAX) 10 MG tablet Take 1 tablet (10 mg total) by mouth 3 (three) times daily as needed. 04/24/23  Yes Venora Maples, MD  ibuprofen (ADVIL) 600 MG tablet Take 1 tablet (600 mg total) by mouth every 6 (six) hours as needed for moderate pain, cramping, mild pain, headache or fever. 07/03/22  Yes Carlisle Beers, FNP  potassium chloride SA (KLOR-CON M) 20 MEQ tablet Take 1 tablet (20 mEq total) by mouth 2 (two) times daily. 05/26/23  Yes Calem Cocozza R, PA-C  sertraline (ZOLOFT) 50 MG tablet Take 1 tablet (50 mg total) by mouth daily. 04/24/23  Yes Venora Maples, MD  metroNIDAZOLE (FLAGYL) 500 MG tablet Take 1 tablet (500 mg total) by mouth 2 (two) times daily. Patient not taking: Reported on 05/26/2023 04/20/23   Sue Lush, FNP  ondansetron (ZOFRAN-ODT) 8 MG disintegrating tablet Take 1 tablet (8 mg total) by mouth every 8 (eight) hours as needed for nausea or vomiting. Patient not taking: Reported on  05/26/2023 03/14/23   Lorre Nick, MD  oxyCODONE-acetaminophen (PERCOCET/ROXICET) 5-325 MG tablet Take 1 tablet by mouth every 6 (six) hours as needed for severe pain. Patient not taking: Reported on 05/26/2023 03/14/23   Lorre Nick, MD      Allergies    Patient has no known allergies.    Review of Systems   Review of Systems  Constitutional:  Positive for chills and fever.  Gastrointestinal:  Positive for abdominal pain, nausea and vomiting. Negative for diarrhea.  Genitourinary:  Positive for dysuria, flank pain, frequency and urgency. Negative for decreased urine volume, difficulty urinating and hematuria.  Musculoskeletal:  Positive for myalgias.    Physical Exam Updated Vital Signs BP (!) 155/109   Pulse 100   Temp 97.9 F (36.6 C)   Resp (!) 25   Ht 5\' 1"  (1.549 m)   Wt 65.8 kg   LMP 05/14/2023   SpO2 100%   BMI 27.40 kg/m  Physical Exam Vitals and nursing note reviewed.  Constitutional:      General: She is not in acute distress.    Appearance: Normal appearance. She is not ill-appearing or diaphoretic.  Cardiovascular:     Rate and Rhythm: Normal rate and regular rhythm.  Pulmonary:     Effort: Pulmonary effort is normal.  Abdominal:     General: Abdomen is flat.     Palpations: Abdomen is soft.     Tenderness: There is abdominal tenderness in the suprapubic area and  left lower quadrant. There is no right CVA tenderness or left CVA tenderness.     Hernia: No hernia is present.     Comments: No reproducible CVA tenderness on exam.  Patient subjectively feels that her left flank and back are painful.   Skin:    General: Skin is warm and dry.     Capillary Refill: Capillary refill takes less than 2 seconds.  Neurological:     Mental Status: She is alert. Mental status is at baseline.  Psychiatric:        Mood and Affect: Mood normal.        Behavior: Behavior normal.     ED Results / Procedures / Treatments   Labs (all labs ordered are listed, but only  abnormal results are displayed) Labs Reviewed  COMPREHENSIVE METABOLIC PANEL - Abnormal; Notable for the following components:      Result Value   Potassium 2.6 (*)    Chloride 91 (*)    Glucose, Bld 116 (*)    Creatinine, Ser 1.24 (*)    Total Protein 8.7 (*)    AST 55 (*)    All other components within normal limits  CBC WITH DIFFERENTIAL/PLATELET - Abnormal; Notable for the following components:   nRBC 0.3 (*)    All other components within normal limits  URINALYSIS, W/ REFLEX TO CULTURE (INFECTION SUSPECTED) - Abnormal; Notable for the following components:   APPearance HAZY (*)    Specific Gravity, Urine 1.037 (*)    Hgb urine dipstick SMALL (*)    Bilirubin Urine SMALL (*)    Ketones, ur 15 (*)    Protein, ur 100 (*)    Nitrite POSITIVE (*)    Leukocytes,Ua TRACE (*)    Non Squamous Epithelial 0-5 (*)    All other components within normal limits  RAPID URINE DRUG SCREEN, HOSP PERFORMED - Abnormal; Notable for the following components:   Tetrahydrocannabinol POSITIVE (*)    All other components within normal limits  SARS CORONAVIRUS 2 BY RT PCR  TSH  T4, FREE  PREGNANCY, URINE  T3, FREE  TROPONIN I (HIGH SENSITIVITY)    EKG None  Radiology No results found.  Procedures Procedures    Medications Ordered in ED Medications  sodium chloride 0.9 % bolus 1,000 mL (0 mLs Intravenous Stopped 05/26/23 1607)  potassium chloride 10 mEq in 100 mL IVPB (0 mEq Intravenous Stopped 05/26/23 1717)  potassium chloride (KLOR-CON) packet 40 mEq (40 mEq Oral Given 05/26/23 1612)    ED Course/ Medical Decision Making/ A&P                                 Medical Decision Making Amount and/or Complexity of Data Reviewed Labs: ordered. Radiology: ordered.  Risk Prescription drug management.   This patient presents to the ED with chief complaint(s) of UTI symptoms, left flank pain with pertinent past medical history of pyelonephritis.  The complaint involves an extensive  differential diagnosis and also carries with it a high risk of complications and morbidity.    The differential diagnosis includes UTI, pyelonephritis, obstructive uropathy, ureterolithiasis, nephrolithiasis  The initial plan is to obtain labs, UA.  Patient was evaluated in triage and was found to be hypertensive and tachycardic.  There was concern for thyrotoxicosis, additional thyroid labs were ordered.    Initial Assessment:   Exam significant for overall well-appearing patient who is not in acute distress.  Abdomen is  soft with mild suprapubic and left lower quadrant tenderness.  Patient subjectively reporting left flank pain, no CVA tenderness reproducible on exam.  Independent ECG/labs interpretation:  The following labs were independently interpreted:  CBC without leukocytosis or anemia.  Metabolic panel with significant hypokalemia and elevated creatinine.  GFR is still greater than 60.  No other electrolyte disturbance.  UA with positive nitrites, trace leukocytes, no bacteria, no convincing of infection.  UDS THC positive.  TSH within normal range.  Treatment and Reassessment: Patient given IV fluids with significant improvement in heart rate.  Patient does appear comfortable.  IV and oral potassium given for hypokalemia.    CT renal stone study ordered to investigate for any obstructive uropathy in the setting of left-sided flank pain.  Disposition:   7:01 PM Care transferred to Arthor Captain, PA-C at the end of my shift as the patient will require reassessment once labs/imaging have resulted. Patient presentation, ED course, and plan of care discussed with review of all pertinent labs and imaging. Please see his/her note for further details regarding further ED course and disposition. Plan at time of handoff is follow up on CT scan results.  If normal, discharge patient home with oral potassium prescription and PCP follow up. This may be altered or completely changed at the discretion  of the oncoming team pending results of further workup.           Final Clinical Impression(s) / ED Diagnoses Final diagnoses:  Acute left flank pain  Dysuria    Rx / DC Orders ED Discharge Orders          Ordered    potassium chloride SA (KLOR-CON M) 20 MEQ tablet  2 times daily        05/26/23 1900              Lenard Simmer, PA-C 05/26/23 1901    Terald Sleeper, MD 05/26/23 2326

## 2023-05-26 NOTE — ED Provider Triage Note (Signed)
Emergency Medicine Provider Triage Evaluation Note  Melissa Guzman , a 27 y.o. female  was evaluated in triage.  Pt complains of fevers, chills, fatigue, weakness heat intolerance. Patient  s/p  partum. Has had abnormal sxs since.  Seen1.5 mos ago, dx with pyelo. Concerned she may have it again, but this ti e not having a lost oh back pain and pelvic pain. She has had HTN since having her baby in June. Notably tachycardic. Took zoloft for about 1.5 weeks but states it made her feel "out of body." She stopped taking it 1 week ago.   Review of Systems  Positive: Sweating, heat intolerance Negative:  Chest pain  Physical Exam  BP (!) 175/121 (BP Location: Left Arm)   Pulse (!) 123   Temp 97.9 F (36.6 C)   Resp 18   Ht 5\' 1"  (1.549 m)   Wt 65.8 kg   LMP 05/14/2023   SpO2 96%   BMI 27.40 kg/m  Gen:   Awake, no distress   Resp:  Normal effort  MSK:   Moves extremities without difficulty  Other:  Racing heart.  Medical Decision Making  Medically screening exam initiated at 2:38 PM.  Appropriate orders placed.  Sharrion Galyan was informed that the remainder of the evaluation will be completed by another provider, this initial triage assessment does not replace that evaluation, and the importance of remaining in the ED until their evaluation is complete.     Arthor Captain, PA-C 05/26/23 2242

## 2023-05-26 NOTE — Progress Notes (Signed)
Plan of Care Note for accepted transfer   Patient: Melissa Guzman MRN: [id number]   DOA: 05/26/2023  Facility requesting transfer: [facility]   Requesting Provider: Arthor Captain, PA   Reason for transfer: UTI   Facility course: 27 year old female with hypertension, anxiety, and depression who presents with aches, fever, lower abdominal pain, dysuria, and left flank pain.  She is found to have potassium of 2.6, creatinine 1.24, normal WBC, and urinalysis with nitrites and pyuria, but no bacteria seen on microscopy.  CT of the abdomen and pelvis is negative for acute findings.  Urine culture was collected in the ED and patient was treated with 1 L of normal saline, Rocephin, fentanyl, Zofran, and IV and oral potassium.  Plan of care: The patient is accepted for admission to Telemetry unit, at [facility].   Author: Briscoe Deutscher, MD 05/26/2023  Check [email or web address] for on-call coverage.  Nursing staff, Please call [facility] Admits & Consults System-Wide number on Amion as soon as patient's arrival, so appropriate admitting provider can evaluate the pt.

## 2023-05-27 ENCOUNTER — Encounter (HOSPITAL_COMMUNITY): Payer: Self-pay | Admitting: Family Medicine

## 2023-05-27 DIAGNOSIS — N10 Acute tubulo-interstitial nephritis: Secondary | ICD-10-CM | POA: Diagnosis not present

## 2023-05-27 LAB — CBC WITH DIFFERENTIAL/PLATELET
Abs Immature Granulocytes: 0.01 10*3/uL (ref 0.00–0.07)
Basophils Absolute: 0 10*3/uL (ref 0.0–0.1)
Basophils Relative: 0 %
Eosinophils Absolute: 0 10*3/uL (ref 0.0–0.5)
Eosinophils Relative: 0 %
HCT: 33.7 % — ABNORMAL LOW (ref 36.0–46.0)
Hemoglobin: 11 g/dL — ABNORMAL LOW (ref 12.0–15.0)
Immature Granulocytes: 0 %
Lymphocytes Relative: 32 %
Lymphs Abs: 2.2 10*3/uL (ref 0.7–4.0)
MCH: 30.7 pg (ref 26.0–34.0)
MCHC: 32.6 g/dL (ref 30.0–36.0)
MCV: 94.1 fL (ref 80.0–100.0)
Monocytes Absolute: 0.8 10*3/uL (ref 0.1–1.0)
Monocytes Relative: 11 %
Neutro Abs: 3.7 10*3/uL (ref 1.7–7.7)
Neutrophils Relative %: 57 %
Platelets: 188 10*3/uL (ref 150–400)
RBC: 3.58 MIL/uL — ABNORMAL LOW (ref 3.87–5.11)
RDW: 14.6 % (ref 11.5–15.5)
WBC: 6.7 10*3/uL (ref 4.0–10.5)
nRBC: 0 % (ref 0.0–0.2)

## 2023-05-27 LAB — COMPREHENSIVE METABOLIC PANEL
ALT: 14 U/L (ref 0–44)
AST: 35 U/L (ref 15–41)
Albumin: 3.6 g/dL (ref 3.5–5.0)
Alkaline Phosphatase: 53 U/L (ref 38–126)
Anion gap: 8 (ref 5–15)
BUN: 5 mg/dL — ABNORMAL LOW (ref 6–20)
CO2: 29 mmol/L (ref 22–32)
Calcium: 8.5 mg/dL — ABNORMAL LOW (ref 8.9–10.3)
Chloride: 102 mmol/L (ref 98–111)
Creatinine, Ser: 1.02 mg/dL — ABNORMAL HIGH (ref 0.44–1.00)
GFR, Estimated: >60 mL/min (ref >60)
Glucose, Bld: 107 mg/dL — ABNORMAL HIGH (ref 70–99)
Potassium: 3.1 mmol/L — ABNORMAL LOW (ref 3.5–5.1)
Sodium: 139 mmol/L (ref 135–145)
Total Bilirubin: 0.6 mg/dL (ref 0.3–1.2)
Total Protein: 6.9 g/dL (ref 6.5–8.1)

## 2023-05-27 LAB — PROTIME-INR
INR: 1 (ref 0.8–1.2)
Prothrombin Time: 13.6 seconds (ref 11.4–15.2)

## 2023-05-27 LAB — PHOSPHORUS: Phosphorus: 3.6 mg/dL (ref 2.5–4.6)

## 2023-05-27 LAB — MAGNESIUM
Magnesium: 1.3 mg/dL — ABNORMAL LOW (ref 1.7–2.4)
Magnesium: 1.3 mg/dL — ABNORMAL LOW (ref 1.7–2.4)

## 2023-05-27 LAB — TROPONIN I (HIGH SENSITIVITY): Troponin I (High Sensitivity): 8 ng/L (ref <18)

## 2023-05-27 MED ORDER — ACETAMINOPHEN 650 MG RE SUPP: 650.0000 mg | Freq: Four times a day (QID) | RECTAL | Status: DC | PRN

## 2023-05-27 MED ORDER — AMLODIPINE BESYLATE 5 MG PO TABS
5.0000 mg | ORAL_TABLET | Freq: Every day | ORAL | Status: DC
  Administered 2023-05-27 – 2023-05-28 (×2): 5 mg via ORAL
  Filled 2023-05-27 (×2): qty 1

## 2023-05-27 MED ORDER — SODIUM CHLORIDE 0.9% FLUSH
3.0000 mL | Freq: Two times a day (BID) | INTRAVENOUS | Status: DC
  Administered 2023-05-27 – 2023-05-28 (×2): 3 mL via INTRAVENOUS

## 2023-05-27 MED ORDER — ACETAMINOPHEN 325 MG PO TABS: 650.0000 mg | ORAL_TABLET | Freq: Four times a day (QID) | ORAL | Status: DC | PRN

## 2023-05-27 MED ORDER — LEVALBUTEROL HCL 0.63 MG/3ML IN NEBU: 0.6300 mg | INHALATION_SOLUTION | Freq: Four times a day (QID) | RESPIRATORY_TRACT | Status: DC | PRN

## 2023-05-27 MED ORDER — MELATONIN 5 MG PO TABS: 5.0000 mg | ORAL_TABLET | Freq: Every evening | ORAL | Status: DC | PRN

## 2023-05-27 MED ORDER — FLEET ENEMA RE ENEM: 1.0000 | ENEMA | Freq: Once | RECTAL | Status: DC | PRN

## 2023-05-27 MED ORDER — SENNOSIDES-DOCUSATE SODIUM 8.6-50 MG PO TABS: 1.0000 | ORAL_TABLET | Freq: Every evening | ORAL | Status: DC | PRN

## 2023-05-27 MED ORDER — HYDRALAZINE HCL 20 MG/ML IJ SOLN
10.0000 mg | INTRAMUSCULAR | Status: DC | PRN
  Administered 2023-05-27: 10 mg via INTRAVENOUS
  Filled 2023-05-27: qty 1

## 2023-05-27 MED ORDER — POTASSIUM CHLORIDE CRYS ER 20 MEQ PO TBCR
40.0000 meq | EXTENDED_RELEASE_TABLET | Freq: Once | ORAL | Status: AC
  Administered 2023-05-27: 40 meq via ORAL
  Filled 2023-05-27: qty 2

## 2023-05-27 MED ORDER — IPRATROPIUM BROMIDE 0.02 % IN SOLN: 0.5000 mg | Freq: Four times a day (QID) | RESPIRATORY_TRACT | Status: DC | PRN

## 2023-05-27 MED ORDER — HYDROMORPHONE HCL 1 MG/ML IJ SOLN: 0.5000 mg | INTRAMUSCULAR | Status: DC | PRN

## 2023-05-27 MED ORDER — SODIUM CHLORIDE 0.9 % IV SOLN: INTRAVENOUS | Status: DC

## 2023-05-27 MED ORDER — OXYCODONE HCL 5 MG PO TABS
5.0000 mg | ORAL_TABLET | ORAL | Status: DC | PRN
  Administered 2023-05-28: 5 mg via ORAL
  Filled 2023-05-27: qty 1

## 2023-05-27 MED ORDER — SODIUM CHLORIDE 0.9% FLUSH
3.0000 mL | Freq: Two times a day (BID) | INTRAVENOUS | Status: DC
  Administered 2023-05-27 – 2023-05-28 (×3): 3 mL via INTRAVENOUS

## 2023-05-27 MED ORDER — BISACODYL 5 MG PO TBEC: 5.0000 mg | DELAYED_RELEASE_TABLET | Freq: Every day | ORAL | Status: DC | PRN

## 2023-05-27 MED ORDER — SERTRALINE HCL 50 MG PO TABS
50.0000 mg | ORAL_TABLET | Freq: Every day | ORAL | Status: DC
  Administered 2023-05-27 – 2023-05-28 (×2): 50 mg via ORAL
  Filled 2023-05-27 (×2): qty 1

## 2023-05-27 MED ORDER — POTASSIUM CHLORIDE IN NACL 20-0.9 MEQ/L-% IV SOLN
INTRAVENOUS | Status: AC
  Filled 2023-05-27 (×2): qty 1000

## 2023-05-27 MED ORDER — ONDANSETRON HCL 4 MG PO TABS: 4.0000 mg | ORAL_TABLET | Freq: Four times a day (QID) | ORAL | Status: DC | PRN

## 2023-05-27 MED ORDER — ACETAMINOPHEN 325 MG PO TABS
650.0000 mg | ORAL_TABLET | Freq: Four times a day (QID) | ORAL | Status: DC | PRN
  Administered 2023-05-27 (×2): 650 mg via ORAL
  Filled 2023-05-27 (×3): qty 2

## 2023-05-27 MED ORDER — SODIUM CHLORIDE 0.9 % IV SOLN
1.0000 g | INTRAVENOUS | Status: DC
  Administered 2023-05-27: 1 g via INTRAVENOUS
  Filled 2023-05-27: qty 10

## 2023-05-27 MED ORDER — ONDANSETRON HCL 4 MG/2ML IJ SOLN: 4.0000 mg | Freq: Four times a day (QID) | INTRAMUSCULAR | Status: DC | PRN

## 2023-05-27 MED ORDER — TRAZODONE HCL 50 MG PO TABS
25.0000 mg | ORAL_TABLET | Freq: Every evening | ORAL | Status: DC | PRN
  Administered 2023-05-27: 25 mg via ORAL
  Filled 2023-05-27: qty 1

## 2023-05-27 MED ORDER — MELATONIN 3 MG PO TABS: 3.0000 mg | ORAL_TABLET | Freq: Every evening | ORAL | Status: DC | PRN

## 2023-05-27 MED ORDER — HEPARIN SODIUM (PORCINE) 5000 UNIT/ML IJ SOLN: 5000.0000 [IU] | Freq: Three times a day (TID) | INTRAMUSCULAR | Status: DC

## 2023-05-27 MED ORDER — IBUPROFEN 600 MG PO TABS
600.0000 mg | ORAL_TABLET | Freq: Four times a day (QID) | ORAL | Status: DC | PRN
  Administered 2023-05-27: 600 mg via ORAL
  Filled 2023-05-27: qty 1

## 2023-05-27 MED ORDER — MAGNESIUM SULFATE 2 GM/50ML IV SOLN
2.0000 g | Freq: Once | INTRAVENOUS | Status: AC
  Administered 2023-05-27: 2 g via INTRAVENOUS
  Filled 2023-05-27: qty 50

## 2023-05-27 NOTE — Plan of Care (Signed)
  Problem: Coping: Goal: Ability to verbalize feelings will improve by discharge Outcome: Progressing

## 2023-05-27 NOTE — Progress Notes (Signed)
   05/27/23 2241  Provider Notification  Provider Name/Title Dr. Julian Reil  Date Provider Notified 05/27/23  Time Provider Notified 2241  Method of Notification  (secure chat)  Notification Reason Red med refusal (heparin)  Provider response No new orders  Date of Provider Response 05/27/23  Time of Provider Response 2247

## 2023-05-27 NOTE — Hospital Course (Signed)
Melissa Guzman is a 27 year old female with last medical history significant for hypertension, anxiety, depression, previous C-section, presented to [facility] initially with diffuse body ache, fever, lower abdominal pain, dysuria significant left flank pain.   Initial ED evaluation, hemodynamically with stable afebrile,  Potassium 2.6, creatinine 1.24, normal WBC, urinalysis significant for nitrites, pyuria no bacteria CT abdomen/pelvis: Negative for acute finding, ruled out hydronephrosis  -Urine cultures were obtained, patient started on antibiotics of Rocephin -Actualized were repleted

## 2023-05-27 NOTE — Evaluation (Signed)
Occupational Therapy Evaluation/Discharge Patient Details Name: Melissa Guzman MRN: [id number] DOB: 06-Oct-1995 Today's Date: 05/27/2023   History of Present Illness 27 year old female with hypertension, anxiety, and depression who presents with aches, fever, lower abdominal pain, dysuria, and left flank pain.   Clinical Impression   Patient evaluated by Occupational Therapy with no further acute OT needs. Pt is able to complete all ADL tasks and functional mobility independently. No follow-up Occupational Therapy or equipment needs. OT is signing off. Thank you for this referral.        If plan is discharge home, recommend the following:  (N/A)    Functional Status Assessment  Patient has not had a recent decline in their functional status  Equipment Recommendations  None recommended by OT       Precautions / Restrictions Precautions Precautions: None Restrictions Weight Bearing Restrictions: No      Mobility Bed Mobility Overal bed mobility: Independent     Transfers Overall transfer level: Independent           Balance Overall balance assessment: Independent         ADL either performed or assessed with clinical judgement   ADL Overall ADL's : Independent;At baseline             Vision Baseline Vision/History: 0 No visual deficits Ability to See in Adequate Light: 0 Adequate Patient Visual Report: No change from baseline       Perception Perception: Not tested       Praxis Praxis: Not tested       Pertinent Vitals/Pain  0/10 pain     Extremity/Trunk Assessment Upper Extremity Assessment Upper Extremity Assessment: Overall WFL for tasks assessed   Lower Extremity Assessment Lower Extremity Assessment: Overall WFL for tasks assessed   Cervical / Trunk Assessment Cervical / Trunk Assessment: Normal   Communication Communication Communication: No apparent difficulties   Cognition Arousal: Alert Behavior During Therapy: WFL for tasks  assessed/performed Overall Cognitive Status: Within Functional Limits for tasks assessed                 Home Living Family/patient expects to be discharged to:: Private residence Living Arrangements: Children (1 y.o. daughter) Available Help at Discharge: Family;Available PRN/intermittently    Home Equipment: None          Prior Functioning/Environment Prior Level of Function : Independent/Modified Independent;Driving;Working/employed      Mobility Comments: independent ADLs Comments: independent. works at a day care        OT Problem List: Impaired balance (sitting and/or standing)         OT Goals(Current goals can be found in the care plan section) Acute Rehab OT Goals Patient Stated Goal: to take a shower  OT Frequency:  1X visit       AM-PAC OT "6 Clicks" Daily Activity     Outcome Measure Help from another person eating meals?: None Help from another person taking care of personal grooming?: None Help from another person toileting, which includes using toliet, bedpan, or urinal?: None Help from another person bathing (including washing, rinsing, drying)?: None Help from another person to put on and taking off regular upper body clothing?: None Help from another person to put on and taking off regular lower body clothing?: None 6 Click Score: 24   End of Session    Activity Tolerance: Patient tolerated treatment well Patient left: in bed;with call bell/phone within reach  OT Visit Diagnosis: Unsteadiness on feet (R26.81)  Time: 1324-4010 OT Time Calculation (min): 10 min Charges:  OT General Charges $OT Visit: 1 Visit OT Evaluation $OT Eval Low Complexity: 1 Low  Limmie Patricia, OTR/L,CBIS  Supplemental OT - [facility] and [facility] Secure Chat Preferred    Melissa Guzman, Melissa Guzman 05/27/2023, 11:29 AM

## 2023-05-27 NOTE — H&P (Signed)
History and Physical   Patient: Melissa Guzman                            PCP: Pcp, No                    DOB: 12-20-1995            DOA: 05/26/2023 UXN:[id number]             DOS: 05/27/2023, 8:26 AM  Pcp, No  Patient coming from:   HOME  I have personally reviewed patient's medical records, in electronic medical records, including:  [place] link, and care everywhere.    Chief Complaint:   Chief Complaint  Patient presents with   Generalized Body Aches    History of present illness:    Melissa Guzman is a 27 year old female with last medical history significant for hypertension, anxiety, depression, previous C-section, presented to [facility] initially with diffuse body ache, fever, lower abdominal pain, dysuria significant left flank pain.   Initial ED evaluation, hemodynamically with stable afebrile,  Potassium 2.6, creatinine 1.24, normal WBC, urinalysis significant for nitrites, pyuria no bacteria CT abdomen/pelvis: Negative for acute finding, ruled out hydronephrosis  -Urine cultures were obtained, patient started on antibiotics of Rocephin -Actualized were repleted  ED Course:   Blood pressure (!) 141/96, pulse 93, temperature 98.3 F (36.8 C), temperature source Oral, resp. rate 20, height 5\' 1"  (1.549 m), weight 65.8 kg, last menstrual period 05/14/2023, SpO2 99%, not currently breastfeeding.     Review of Systems: As per HPI, otherwise 10 point review of systems were negative.   ----------------------------------------------------------------------------------------------------------------------  No Known Allergies  Home MEDs:  Prior to Admission medications   Medication Sig Start Date End Date Taking? Authorizing Provider  acetaminophen (TYLENOL) 500 MG tablet Take 2 tablets (1,000 mg total) by mouth every 8 (eight) hours as needed (pain). 03/05/22  Yes Worthy Rancher, MD  hydrOXYzine (ATARAX) 10 MG tablet Take 1 tablet (10 mg total) by mouth 3 (three) times  daily as needed. 04/24/23  Yes Venora Maples, MD  ibuprofen (ADVIL) 600 MG tablet Take 1 tablet (600 mg total) by mouth every 6 (six) hours as needed for moderate pain, cramping, mild pain, headache or fever. 07/03/22  Yes Carlisle Beers, FNP  potassium chloride SA (KLOR-CON M) 20 MEQ tablet Take 1 tablet (20 mEq total) by mouth 2 (two) times daily. 05/26/23  Yes Clark, Meghan R, PA-C  sertraline (ZOLOFT) 50 MG tablet Take 1 tablet (50 mg total) by mouth daily. 04/24/23  Yes Venora Maples, MD  metroNIDAZOLE (FLAGYL) 500 MG tablet Take 1 tablet (500 mg total) by mouth 2 (two) times daily. Patient not taking: Reported on 05/26/2023 04/20/23   Sue Lush, FNP  ondansetron (ZOFRAN-ODT) 8 MG disintegrating tablet Take 1 tablet (8 mg total) by mouth every 8 (eight) hours as needed for nausea or vomiting. Patient not taking: Reported on 05/26/2023 03/14/23   Lorre Nick, MD  oxyCODONE-acetaminophen (PERCOCET/ROXICET) 5-325 MG tablet Take 1 tablet by mouth every 6 (six) hours as needed for severe pain. Patient not taking: Reported on 05/26/2023 03/14/23   Lorre Nick, MD    PRN MEDs: acetaminophen **OR** acetaminophen, bisacodyl, hydrALAZINE, HYDROmorphone (DILAUDID) injection, HYDROmorphone (DILAUDID) injection, ipratropium, labetalol, levalbuterol, melatonin, ondansetron (ZOFRAN) IV, ondansetron **OR** ondansetron (ZOFRAN) IV, oxyCODONE, senna-docusate, sodium phosphate, traZODone  Past Medical History:  Diagnosis Date   Acute blood loss anemia 03/01/2022  Anemia    COVID-19 affecting pregnancy in third trimester 02/04/2022   +01/28/2022   Endometritis 03/05/2022   Postpartum septic endometritis 03/09/2022    Past Surgical History:  Procedure Laterality Date   CESAREAN SECTION N/A 03/01/2022   Procedure: CESAREAN SECTION;  Surgeon: Hermina Staggers, MD;  Location: [facility] LD [facility]S;  Service: Obstetrics;  Laterality: N/A;   IR RADIOLOGIST EVAL & MGMT  03/28/2022   IR RADIOLOGIST EVAL &  MGMT  04/10/2022     reports that she has never smoked. She has never used smokeless tobacco. She reports current alcohol use. She reports that she does not use drugs.   No family history on file.  Physical Exam:   Vitals:   05/26/23 2200 05/26/23 2218 05/26/23 2318 05/27/23 0151  BP: (!) 190/139 (!) 185/114 (!) 155/96 (!) 141/96  Pulse: 78 85 (!) 102 93  Resp: 15 (!) 22 18 20   Temp:   98.3 F (36.8 C) 98.3 F (36.8 C)  TempSrc:    Oral  SpO2: 99% 100% 100% 99%  Weight:      Height:       Constitutional: NAD, calm, comfortable Eyes: PERRL, lids and conjunctivae normal ENMT: Mucous membranes are moist. Posterior pharynx clear of any exudate or lesions.Normal dentition.  Neck: normal, supple, no masses, no thyromegaly Respiratory: clear to auscultation bilaterally, no wheezing, no crackles. Normal respiratory effort. No accessory muscle use.  Cardiovascular: Regular rate and rhythm, no murmurs / rubs / gallops. No extremity edema. 2+ pedal pulses. No carotid bruits.  Abdomen: no tenderness, no masses palpated. No hepatosplenomegaly. Bowel sounds positive.  Musculoskeletal: no clubbing / cyanosis. No joint deformity upper and lower extremities. Good ROM, no contractures. Normal muscle tone.  Neurologic: CN II-XII grossly intact. Sensation intact, DTR normal. Strength 5/5 in all 4.  Psychiatric: Normal judgment and insight. Alert and oriented x 3. Normal mood.  Skin: no rashes, lesions, ulcers. No induration     Labs on admission:    I have personally reviewed following labs and imaging studies  CBC: Recent Labs  Lab 05/26/23 1447 05/27/23 0530  WBC 7.2 6.7  NEUTROABS 4.5 3.7  HGB 12.6 11.0*  HCT 37.7 33.7*  MCV 94.3 94.1  PLT 236 188   Basic Metabolic Panel: Recent Labs  Lab 05/26/23 1447 05/27/23 0530  NA 136 139  K 2.6* 3.1*  CL 91* 102  CO2 31 29  GLUCOSE 116* 107*  BUN 10 5*  CREATININE 1.24* 1.02*  CALCIUM 10.1 8.5*  MG  --  1.3*   GFR: Estimated  Creatinine Clearance: 73.8 mL/min (A) (by C-G formula based on SCr of 1.02 mg/dL (H)). Liver Function Tests: Recent Labs  Lab 05/26/23 1447 05/27/23 0530  AST 55* 35  ALT 17 14  ALKPHOS 58 53  BILITOT 0.6 0.6  PROT 8.7* 6.9  ALBUMIN 4.8 3.6    Thyroid Function Tests: Recent Labs    05/26/23 1447  TSH 2.190  FREET4 0.75   Anemia Panel: No results for input(s): "VITAMINB12", "FOLATE", "FERRITIN", "TIBC", "IRON", "RETICCTPCT" in the last 72 hours. Urine analysis:    Component Value Date/Time   COLORURINE YELLOW 05/26/2023 1455   APPEARANCEUR HAZY (A) 05/26/2023 1455   LABSPEC 1.037 (H) 05/26/2023 1455   PHURINE 6.0 05/26/2023 1455   GLUCOSEU NEGATIVE 05/26/2023 1455   HGBUR SMALL (A) 05/26/2023 1455   BILIRUBINUR SMALL (A) 05/26/2023 1455   KETONESUR 15 (A) 05/26/2023 1455   PROTEINUR 100 (A) 05/26/2023 1455   UROBILINOGEN  0.2 01/27/2014 0922   NITRITE POSITIVE (A) 05/26/2023 1455   LEUKOCYTESUR TRACE (A) 05/26/2023 1455    Last A1C:  Lab Results  Component Value Date   HGBA1C 4.9 09/05/2021     Radiologic Exams on Admission:   CT Renal Stone Study  Result Date: 05/26/2023 CLINICAL DATA:  Abdominal pain.  Kidney stone suspected. EXAM: CT ABDOMEN AND PELVIS WITHOUT CONTRAST TECHNIQUE: Multidetector CT imaging of the abdomen and pelvis was performed following the standard protocol without IV contrast. RADIATION DOSE REDUCTION: This exam was performed according to the departmental dose-optimization program which includes automated exposure control, adjustment of the mA and/or kV according to patient size and/or use of iterative reconstruction technique. COMPARISON:  CT of the abdomen pelvis dated 03/17/2023. FINDINGS: Evaluation of this exam is limited in the absence of intravenous contrast. Lower chest: The visualized lung bases are clear. No intra-abdominal free air or free fluid. Hepatobiliary: The liver is unremarkable. No biliary ductal dilatation. Small amount of  layering stones noted in the gallbladder. No pericholecystic fluid or evidence of acute cholecystitis by CT. Pancreas: Unremarkable. No pancreatic ductal dilatation or surrounding inflammatory changes. Spleen: Normal in size without focal abnormality. Adrenals/Urinary Tract: The adrenal glands are unremarkable. There is no hydronephrosis or nephrolithiasis on either side. The visualized ureters and urinary bladder appear unremarkable. Stomach/Bowel: There is no bowel obstruction or active inflammation. The appendix is normal. Vascular/Lymphatic: The abdominal aorta and IVC are grossly unremarkable on this noncontrast CT. No portal venous gas. There is no adenopathy. Reproductive: The uterus is anteverted and grossly unremarkable. No adnexal masses. Other: None Musculoskeletal: Bilateral L5 pars defects. No listhesis. No acute osseous pathology. IMPRESSION: 1. No acute intra-abdominal or pelvic pathology. No hydronephrosis or nephrolithiasis. 2. Cholelithiasis. 3. Bilateral L5 pars defects. No listhesis. Electronically Signed   By: Elgie Collard M.D.   On: 05/26/2023 19:30    EKG:   Independently reviewed.  Orders placed or performed during the hospital encounter of 05/26/23   EKG 12-Lead   EKG 12-Lead   EKG 12-Lead   EKG 12-Lead   Repeat EKG   Repeat EKG   EKG 12-Lead   EKG 12-Lead   EKG   EKG   EKG   EKG   EKG 12-Lead   ---------------------------------------------------------------------------------------------------------------------------------------    Assessment / Plan:   Principal Problem:   UTI (urinary tract infection) Active Problems:   Alpha thalassemia silent carrier   Anxiety and depression   Essential hypertension   Assessment and Plan: * UTI (urinary tract infection) - Urinalysis significant for nitrites, pyuria, no bacteria, -CT abdomen pelvis negative for any hydronephrosis -Continue IV antibiotics of Rocephin -Continue IV fluid -Follow-up with urine  culture-modify antibiotics accordingly  Alpha thalassemia silent carrier Currently stable, monitoring  Essential hypertension - Persistently elevated, no home medication identified -Started on as needed IV hydralazine -Starting Norvasc 5 mg daily  Anxiety and depression - Continue home medication of Zoloft, with holding Atarax       Consults called:  None -------------------------------------------------------------------------------------------------------------------------------------------- DVT prophylaxis:  heparin injection 5,000 Units Start: 05/27/23 0915 TED hose Start: 05/27/23 0815 SCDs Start: 05/27/23 0815 SCDs Start: 05/27/23 0023   Code Status:   Code Status: Full Code   Admission status: Patient will be admitted as Observation, with a less than 2 midnight length of stay. Level of care: Telemetry   Family Communication:  none at bedside  (The above findings and plan of care has been discussed with patient in detail, the patient expressed understanding and  agreement of above plan)  --------------------------------------------------------------------------------------------------------------------------------------------------  Disposition Plan:  Anticipated 1-2 days Status is: Observation The patient remains OBS appropriate and will d/c before 2 midnights.     ----------------------------------------------------------------------------------------------------------------------------------------------------  Time spent:  6  Min.  Was spent seeing and evaluating the patient, reviewing all medical records, drawn plan of care.  SIGNED: Kendell Bane, MD, FHM. FAAFP. [place] [facility], Pager  (Please use [email or web address] to page/ or secure chat through epic) If 7PM-7AM, please contact night-coverage [email or web address],  05/27/2023, 8:26 AM

## 2023-05-27 NOTE — Assessment & Plan Note (Signed)
-   Urinalysis significant for nitrites, pyuria, no bacteria, -CT abdomen pelvis negative for any hydronephrosis -Continue IV antibiotics of Rocephin -Continue IV fluid -Follow-up with urine culture-modify antibiotics accordingly

## 2023-05-27 NOTE — Progress Notes (Signed)
(  Carryover admission to the Day Admitter; accepted by Dr.  Antionette Char as transfer from  [facility]  to a  med-tele bed at  [facility]  for  UTI, hypokalemia. Please see Dr.  Francesco Runner transfer progress note for additional details).  She received both IV and oral potassium chloride prior to transfer from [facility], and was started on Rocephin for suspected urinary tract infection.  The patient is noted to have existing orders for prn IV Dilaudid, as well as continuous normal saline with 20 mill equivalents per liter Kcl.  I have placed some additional preliminary admit orders via the adult multi-morbid admission order set. I have also ordered additional Rocephin for her suspected urinary tract infection.  I have also ordered morning labs in the form of CMP, CBC, and serum magnesium level.    Newton Pigg, DO Hospitalist

## 2023-05-27 NOTE — Progress Notes (Signed)
PT Cancellation Note  Patient Details Name: Melissa Guzman MRN: [id number] DOB: Apr 06, 1996   Cancelled Treatment:    Reason Eval/Treat Not Completed: PT screened, no needs identified, will sign off. Per discussion with pt and OT, the pt has been mobilizing and performing ADLs independently. Pt has no current acute PT needs at this time. PT signing off.   Arlyss Gandy 05/27/2023, 12:31 PM

## 2023-05-27 NOTE — Assessment & Plan Note (Signed)
-   Continue home medication of Zoloft, with holding Atarax

## 2023-05-27 NOTE — TOC CM/SW Note (Signed)
Transition of Care [facility]) - Inpatient Brief Assessment   Patient Details  Name: Melissa Guzman MRN: [id number] Date of Birth: [birth year]  [facility]) CM/SW Contact:    Tom-Johnson, Hershal Coria, RN Phone Number: 05/27/2023, 4:24 PM   Clinical Narrative:  [facility] with Fever, Dysuria, Body Aches, Left Abdominal Flank pain. Recent admit with Kidney infection. On IV abx.  From home with daughter. Both parents and four siblings supportive. Employed, independent with care and drive self prior to admission. PCP is Crissie Reese, Mary Sella, MD at  [facility], updated on Epic and uses [facility] on [address].   CM will continue to follow as patient progresses with care towards discharge.       Transition of Care Asessment: Insurance and Status: Insurance coverage has been reviewed Patient has primary care physician: Yes Home environment has been reviewed: Yes Prior level of function:: Independent Prior/Current Home Services: No current home services Social Determinants of Health Reivew: SDOH reviewed no interventions necessary Readmission risk has been reviewed: Yes Transition of care needs: no transition of care needs at this time

## 2023-05-27 NOTE — Assessment & Plan Note (Signed)
Currently stable, monitoring

## 2023-05-27 NOTE — Assessment & Plan Note (Signed)
-   Persistently elevated, no home medication identified -Started on as needed IV hydralazine -Starting Norvasc 5 mg daily

## 2023-05-28 DIAGNOSIS — I10 Essential (primary) hypertension: Secondary | ICD-10-CM

## 2023-05-28 DIAGNOSIS — N390 Urinary tract infection, site not specified: Secondary | ICD-10-CM

## 2023-05-28 LAB — CBC
HCT: 31.4 % — ABNORMAL LOW (ref 36.0–46.0)
Hemoglobin: 10.1 g/dL — ABNORMAL LOW (ref 12.0–15.0)
MCH: 30.6 pg (ref 26.0–34.0)
MCHC: 32.2 g/dL (ref 30.0–36.0)
MCV: 95.2 fL (ref 80.0–100.0)
Platelets: 186 10*3/uL (ref 150–400)
RBC: 3.3 MIL/uL — ABNORMAL LOW (ref 3.87–5.11)
RDW: 14.8 % (ref 11.5–15.5)
WBC: 5.5 10*3/uL (ref 4.0–10.5)
nRBC: 0 % (ref 0.0–0.2)

## 2023-05-28 LAB — BASIC METABOLIC PANEL
Anion gap: 9 (ref 5–15)
BUN: <5 mg/dL — ABNORMAL LOW (ref 6–20)
CO2: 23 mmol/L (ref 22–32)
Calcium: 8.4 mg/dL — ABNORMAL LOW (ref 8.9–10.3)
Chloride: 106 mmol/L (ref 98–111)
Creatinine, Ser: 0.85 mg/dL (ref 0.44–1.00)
GFR, Estimated: >60 mL/min (ref >60)
Glucose, Bld: 89 mg/dL (ref 70–99)
Potassium: 3.4 mmol/L — ABNORMAL LOW (ref 3.5–5.1)
Sodium: 138 mmol/L (ref 135–145)

## 2023-05-28 LAB — T3, FREE: T3, Free: 4.1 pg/mL (ref 2.0–4.4)

## 2023-05-28 MED ORDER — AMLODIPINE BESYLATE 5 MG PO TABS: 5.0000 mg | ORAL_TABLET | Freq: Every day | ORAL | 0 refills | Status: DC

## 2023-05-28 MED ORDER — SODIUM CHLORIDE 0.9 % IV SOLN
1.0000 g | Freq: Once | INTRAVENOUS | Status: AC
  Administered 2023-05-28: 1 g via INTRAVENOUS
  Filled 2023-05-28: qty 10

## 2023-05-28 MED ORDER — CEPHALEXIN 500 MG PO CAPS: 500.0000 mg | ORAL_CAPSULE | Freq: Three times a day (TID) | ORAL | 0 refills | Status: AC

## 2023-05-28 MED ORDER — MAGNESIUM 400 MG PO TABS: 400.0000 mg | ORAL_TABLET | Freq: Two times a day (BID) | ORAL | 0 refills | Status: AC

## 2023-05-28 NOTE — Discharge Summary (Signed)
Physician Discharge Summary  Melissa Guzman FAO:[id number] DOB: 10/13/1995 DOA: 05/26/2023  PCP: Venora Maples, MD  Admit date: 05/26/2023 Discharge date: 05/28/2023  Admitted From: Home Disposition: Home  Recommendations for Outpatient Follow-up:  Follow up with PCP in 1-2 weeks Please obtain BMP/CBC/magnesium/phosphorus in one week Check blood pressures at home.  Keep a logbook.  Home Health: N/A Equipment/Devices: N/A  Discharge Condition: Stable CODE STATUS: Full code Diet recommendation: Low-salt diet  Discharge summary: 27 year old no significant medical history, history of gestational hypertension presented with 2 days of body ache, fever, lower abdominal pain and dysuria.  In the ER UA was abnormal.  Potassium was 2.6 and magnesium was 1.3.  Urine cultures were obtained.  She was started on Rocephin.  Clinically improved.  She also received aggressive replacement of potassium and magnesium.  Blood pressures were elevated.  Clinically improving.  Urine cultures are pending.  Second dose of Rocephin today, will discharge patient on Keflex for additional 3 days.  Will follow-up urine cultures. Potassium and magnesium was replaced, will discharge patient on oral potassium and magnesium.  She will need recheck levels in 1 week. Blood pressures are always elevated.  Not on any treatment.  Will start patient on amlodipine 5 mg daily.  She will keep monitoring at home, keep a log book and bring it to doctor's office for follow-up.  Stable for discharge.   Discharge Diagnoses:  Principal Problem:   UTI (urinary tract infection) Active Problems:   Alpha thalassemia silent carrier   Anxiety and depression   Essential hypertension    Discharge Instructions  Discharge Instructions     Diet - low sodium heart healthy   Complete by: As directed    Discharge instructions   Complete by: As directed    Follow up at primary care physician for repeat potassium and magnesium check,  blood pressure check   Increase activity slowly   Complete by: As directed       Allergies as of 05/28/2023   No Known Allergies      Medication List     STOP taking these medications    metroNIDAZOLE 500 MG tablet Commonly known as: FLAGYL   ondansetron 8 MG disintegrating tablet Commonly known as: ZOFRAN-ODT   oxyCODONE-acetaminophen 5-325 MG tablet Commonly known as: PERCOCET/ROXICET       TAKE these medications    Acetaminophen Extra Strength 500 MG Tabs Take 2 tablets (1,000 mg total) by mouth every 8 (eight) hours as needed (pain).   amLODipine 5 MG tablet Commonly known as: NORVASC Take 1 tablet (5 mg total) by mouth daily. Start taking on: May 29, 2023   cephALEXin 500 MG capsule Commonly known as: KEFLEX Take 1 capsule (500 mg total) by mouth 3 (three) times daily for 3 days.   hydrOXYzine 10 MG tablet Commonly known as: ATARAX Take 1 tablet (10 mg total) by mouth 3 (three) times daily as needed.   ibuprofen 600 MG tablet Commonly known as: ADVIL Take 1 tablet (600 mg total) by mouth every 6 (six) hours as needed for moderate pain, cramping, mild pain, headache or fever.   Magnesium 400 MG Tabs Take 400 mg by mouth 2 (two) times daily for 10 days.   potassium chloride SA 20 MEQ tablet Commonly known as: KLOR-CON M Take 1 tablet (20 mEq total) by mouth 2 (two) times daily.   sertraline 50 MG tablet Commonly known as: Zoloft Take 1 tablet (50 mg total) by mouth daily.  Follow-up Information     [facility]. Call in 1 day.   Why: To make an appointment with primary care doctor, follow up on labs Contact information: [phone]               No Known Allergies  Consultations: None   Procedures/Studies: CT Renal Stone Study  Result Date: 05/26/2023 CLINICAL DATA:  Abdominal pain.  Kidney stone suspected. EXAM: CT ABDOMEN AND PELVIS WITHOUT CONTRAST TECHNIQUE: Multidetector CT imaging of the abdomen and pelvis was  performed following the standard protocol without IV contrast. RADIATION DOSE REDUCTION: This exam was performed according to the departmental dose-optimization program which includes automated exposure control, adjustment of the mA and/or kV according to patient size and/or use of iterative reconstruction technique. COMPARISON:  CT of the abdomen pelvis dated 03/17/2023. FINDINGS: Evaluation of this exam is limited in the absence of intravenous contrast. Lower chest: The visualized lung bases are clear. No intra-abdominal free air or free fluid. Hepatobiliary: The liver is unremarkable. No biliary ductal dilatation. Small amount of layering stones noted in the gallbladder. No pericholecystic fluid or evidence of acute cholecystitis by CT. Pancreas: Unremarkable. No pancreatic ductal dilatation or surrounding inflammatory changes. Spleen: Normal in size without focal abnormality. Adrenals/Urinary Tract: The adrenal glands are unremarkable. There is no hydronephrosis or nephrolithiasis on either side. The visualized ureters and urinary bladder appear unremarkable. Stomach/Bowel: There is no bowel obstruction or active inflammation. The appendix is normal. Vascular/Lymphatic: The abdominal aorta and IVC are grossly unremarkable on this noncontrast CT. No portal venous gas. There is no adenopathy. Reproductive: The uterus is anteverted and grossly unremarkable. No adnexal masses. Other: None Musculoskeletal: Bilateral L5 pars defects. No listhesis. No acute osseous pathology. IMPRESSION: 1. No acute intra-abdominal or pelvic pathology. No hydronephrosis or nephrolithiasis. 2. Cholelithiasis. 3. Bilateral L5 pars defects. No listhesis. Electronically Signed   By: Elgie Collard M.D.   On: 05/26/2023 19:30   (Echo, Carotid, EGD, Colonoscopy, ERCP)    Subjective: Patient seen and examined.  Has some dysuria but denies any other complaints.  Eager to go home.   Discharge Exam: Vitals:   05/28/23 0512 05/28/23  0856  BP: 132/86 (!) 152/105  Pulse: 85 89  Resp: 18 20  Temp: 98.3 F (36.8 C) 98 F (36.7 C)  SpO2: 99% 100%   Vitals:   05/27/23 2039 05/27/23 2214 05/28/23 0512 05/28/23 0856  BP: (!) 169/125 (!) 143/90 132/86 (!) 152/105  Pulse: 86  85 89  Resp: 18  18 20   Temp: 98.2 F (36.8 C)  98.3 F (36.8 C) 98 F (36.7 C)  TempSrc: Oral  Oral Oral  SpO2: 100%  99% 100%  Weight:      Height:        General: Pt is alert, awake, not in acute distress Cardiovascular: RRR, S1/S2 +, no rubs, no gallops Respiratory: CTA bilaterally, no wheezing, no rhonchi Abdominal: Soft, NT, ND, bowel sounds + Extremities: no edema, no cyanosis    The results of significant diagnostics from this hospitalization (including imaging, microbiology, ancillary and laboratory) are listed below for reference.     Microbiology: Recent Results (from the past 240 hour(s))  Urine Culture     Status: Abnormal (Preliminary result)   Collection Time: 05/26/23  2:55 PM   Specimen: Urine, Clean Catch  Result Value Ref Range Status   Specimen Description   Final    URINE, CLEAN CATCH Performed at [facility], [address] [place], [place] [address]  Special Requests   Final    NONE Performed at [facility], [address] [place], [place] [address]    Culture (A)  Final    >=100,000 COLONIES/mL GRAM NEGATIVE RODS SUSCEPTIBILITIES TO FOLLOW Performed at [facility], [address] [place], [place] [address]    Report Status PENDING  Incomplete  SARS Coronavirus 2 by RT PCR (hospital order, performed in [facility] hospital lab) *cepheid single result test* Anterior Nasal Swab     Status: None   Collection Time: 05/26/23  3:03 PM   Specimen: Anterior Nasal Swab  Result Value Ref Range Status   SARS Coronavirus 2 by RT PCR NEGATIVE NEGATIVE Final    Comment: (NOTE) SARS-CoV-2 target nucleic acids are NOT DETECTED.  The SARS-CoV-2 RNA is generally  detectable in upper and lower respiratory specimens during the acute phase of infection. The lowest concentration of SARS-CoV-2 viral copies this assay can detect is 250 copies / mL. A negative result does not preclude SARS-CoV-2 infection and should not be used as the sole basis for treatment or other patient management decisions.  A negative result may occur with improper specimen collection / handling, submission of specimen other than nasopharyngeal swab, presence of viral mutation(s) within the areas targeted by this assay, and inadequate number of viral copies (<250 copies / mL). A negative result must be combined with clinical observations, patient history, and epidemiological information.  Fact Sheet for Patients:   RoadLapTop.co.za  Fact Sheet for Healthcare Providers: http://kim-miller.com/  This test is not yet approved or  cleared by the [place] FDA and has been authorized for detection and/or diagnosis of SARS-CoV-2 by FDA under an Emergency Use Authorization (EUA).  This EUA will remain in effect (meaning this test can be used) for the duration of the COVID-19 declaration under Section 564(b)(1) of the Act, 21 U.S.C. section 360bbb-3(b)(1), unless the authorization is terminated or revoked sooner.  Performed at [facility], [address], [place], [place] [address]      Labs: BNP (last 3 results) No results for input(s): "BNP" in the last 8760 hours. Basic Metabolic Panel: Recent Labs  Lab 05/26/23 1447 05/27/23 0530 05/28/23 0449  NA 136 139 138  K 2.6* 3.1* 3.4*  CL 91* 102 106  CO2 31 29 23   GLUCOSE 116* 107* 89  BUN 10 5* <5*  CREATININE 1.24* 1.02* 0.85  CALCIUM 10.1 8.5* 8.4*  MG  --  1.3*  1.3*  --   PHOS  --  3.6  --    Liver Function Tests: Recent Labs  Lab 05/26/23 1447 05/27/23 0530  AST 55* 35  ALT 17 14  ALKPHOS 58 53  BILITOT 0.6 0.6  PROT 8.7* 6.9  ALBUMIN  4.8 3.6   No results for input(s): "LIPASE", "AMYLASE" in the last 168 hours. No results for input(s): "AMMONIA" in the last 168 hours. CBC: Recent Labs  Lab 05/26/23 1447 05/27/23 0530 05/28/23 0449  WBC 7.2 6.7 5.5  NEUTROABS 4.5 3.7  --   HGB 12.6 11.0* 10.1*  HCT 37.7 33.7* 31.4*  MCV 94.3 94.1 95.2  PLT 236 188 186   Cardiac Enzymes: No results for input(s): "CKTOTAL", "CKMB", "CKMBINDEX", "TROPONINI" in the last 168 hours. BNP: Invalid input(s): "POCBNP" CBG: No results for input(s): "GLUCAP" in the last 168 hours. D-Dimer No results for input(s): "DDIMER" in the last 72 hours. Hgb A1c No results for input(s): "HGBA1C" in the last 72 hours. Lipid Profile No results  for input(s): "CHOL", "HDL", "LDLCALC", "TRIG", "CHOLHDL", "LDLDIRECT" in the last 72 hours. Thyroid function studies Recent Labs    05/26/23 1447  TSH 2.190  T3FREE 4.1   Anemia work up No results for input(s): "VITAMINB12", "FOLATE", "FERRITIN", "TIBC", "IRON", "RETICCTPCT" in the last 72 hours. Urinalysis    Component Value Date/Time   COLORURINE YELLOW 05/26/2023 1455   APPEARANCEUR HAZY (A) 05/26/2023 1455   LABSPEC 1.037 (H) 05/26/2023 1455   PHURINE 6.0 05/26/2023 1455   GLUCOSEU NEGATIVE 05/26/2023 1455   HGBUR SMALL (A) 05/26/2023 1455   BILIRUBINUR SMALL (A) 05/26/2023 1455   KETONESUR 15 (A) 05/26/2023 1455   PROTEINUR 100 (A) 05/26/2023 1455   UROBILINOGEN 0.2 01/27/2014 0922   NITRITE POSITIVE (A) 05/26/2023 1455   LEUKOCYTESUR TRACE (A) 05/26/2023 1455   Sepsis Labs Recent Labs  Lab 05/26/23 1447 05/27/23 0530 05/28/23 0449  WBC 7.2 6.7 5.5   Microbiology Recent Results (from the past 240 hour(s))  Urine Culture     Status: Abnormal (Preliminary result)   Collection Time: 05/26/23  2:55 PM   Specimen: Urine, Clean Catch  Result Value Ref Range Status   Specimen Description   Final    URINE, CLEAN CATCH Performed at [facility], [address], [place], [place] [address]    Special Requests   Final    NONE Performed at [facility], [address] [place], [place] [address]    Culture (A)  Final    >=100,000 COLONIES/mL GRAM NEGATIVE RODS SUSCEPTIBILITIES TO FOLLOW Performed at [facility], [address] [place], [place] [address]    Report Status PENDING  Incomplete  SARS Coronavirus 2 by RT PCR (hospital order, performed in [facility] hospital lab) *cepheid single result test* Anterior Nasal Swab     Status: None   Collection Time: 05/26/23  3:03 PM   Specimen: Anterior Nasal Swab  Result Value Ref Range Status   SARS Coronavirus 2 by RT PCR NEGATIVE NEGATIVE Final    Comment: (NOTE) SARS-CoV-2 target nucleic acids are NOT DETECTED.  The SARS-CoV-2 RNA is generally detectable in upper and lower respiratory specimens during the acute phase of infection. The lowest concentration of SARS-CoV-2 viral copies this assay can detect is 250 copies / mL. A negative result does not preclude SARS-CoV-2 infection and should not be used as the sole basis for treatment or other patient management decisions.  A negative result may occur with improper specimen collection / handling, submission of specimen other than nasopharyngeal swab, presence of viral mutation(s) within the areas targeted by this assay, and inadequate number of viral copies (<250 copies / mL). A negative result must be combined with clinical observations, patient history, and epidemiological information.  Fact Sheet for Patients:   RoadLapTop.co.za  Fact Sheet for Healthcare Providers: http://kim-miller.com/  This test is not yet approved or  cleared by the [place] FDA and has been authorized for detection and/or diagnosis of SARS-CoV-2 by FDA under an Emergency Use Authorization (EUA).  This EUA will remain in effect (meaning this test can be used) for the duration of  the COVID-19 declaration under Section 564(b)(1) of the Act, 21 U.S.C. section 360bbb-3(b)(1), unless the authorization is terminated or revoked sooner.  Performed at [facility], [address], [place], [place] [address]      Time coordinating discharge: 29 minutes  SIGNED:   Dorcas Carrow, MD  [facility] 05/28/2023, 9:38 AM

## 2023-05-28 NOTE — Plan of Care (Signed)
  Problem: Education: Goal: Knowledge of General Education information will improve Description: Including pain rating scale, medication(s)/side effects and non-pharmacologic comfort measures Outcome: Progressing   Problem: Health Behavior/Discharge Planning: Goal: Ability to manage health-related needs will improve Outcome: Progressing   Problem: Clinical Measurements: Goal: Diagnostic test results will improve Outcome: Progressing   Problem: Activity: Goal: Risk for activity intolerance will decrease Outcome: Progressing   Problem: Nutrition: Goal: Adequate nutrition will be maintained Outcome: Progressing   Problem: Coping: Goal: Level of anxiety will decrease Outcome: Progressing   Problem: Pain Managment: Goal: General experience of comfort will improve Outcome: Progressing   Problem: Safety: Goal: Ability to remain free from injury will improve Outcome: Progressing   Problem: Skin Integrity: Goal: Risk for impaired skin integrity will decrease Outcome: Progressing

## 2023-05-28 NOTE — TOC Transition Note (Signed)
Transition of Care [facility]) - CM/SW Discharge Note   Patient Details  Name: Melissa Guzman MRN: [id number] Date of Birth: July 17, 1996  Transition [facility]) CM/SW Contact:  Tom-Johnson, Hershal Coria, RN Phone Number: 05/28/2023, 10:32 AM   Clinical Narrative:     Patient is scheduled for discharge today.  Hospital f/u and discharge instructions on AVS. No TOC needs or recommendations noted. Family to transport at discharge.  No further TOC needs noted.        Final next level of care: Home/Self Care Barriers to Discharge: Barriers Resolved   Patient Goals and CMS Choice CMS Medicare.gov Compare Post Acute Care list provided to:: Patient Choice offered to / list presented to : NA  Discharge Placement                  Patient to be transferred to facility by: Family      Discharge Plan and Services Additional resources added to the After Visit Summary for                  DME Arranged: N/A DME Agency: NA       HH Arranged: NA HH Agency: NA        Social Determinants of Health (SDOH) Interventions SDOH Screenings   Food Insecurity: No Food Insecurity (05/27/2023)  Housing: Low Risk  (05/27/2023)  Transportation Needs: No Transportation Needs (05/27/2023)  Utilities: Not At Risk (05/27/2023)  Depression (PHQ2-9): High Risk (04/21/2023)  Social Connections: Unknown (02/11/2022)   Received from [facility]  Tobacco Use: Low Risk  (03/14/2023)     Readmission Risk Interventions     No data to display

## 2023-05-29 LAB — URINE CULTURE: Culture: >=100000 — AB

## 2023-06-02 ENCOUNTER — Ambulatory Visit: Payer: Medicaid Other | Admitting: Clinical

## 2023-06-02 DIAGNOSIS — F411 Generalized anxiety disorder: Secondary | ICD-10-CM

## 2023-06-08 NOTE — BH Specialist Note (Signed)
Integrated Behavioral Health via Telemedicine Visit  06/22/2023 Melissa Guzman [id number]  Number of Integrated Behavioral Health Clinician visits: 3- Third Visit  Session Start time: 1546   Session End time: 1615  Total time in minutes: 29  Referring Provider: Federico Flake, MD Patient/Family location: Home [facility] : [facility] Provider location: Center for [facility] at [facility]  All persons participating in visit: [name] and [facility] Jireh Vinas   Types of Service: Individual psychotherapy and Telephone visit  I connected with Nandini Lingafelter and/or Lechelle Lebron's  n/a  via  Telephone or Video Enabled Telemedicine Application  (Video is Caregility application) and verified that I am speaking with the correct person using two identifiers. Discussed confidentiality: Yes   I discussed the limitations of telemedicine and the availability of in person appointments.  Discussed there is a possibility of technology failure and discussed alternative modes of communication if that failure occurs.  I discussed that engaging in this telemedicine visit, they consent to the provision of behavioral healthcare and the services will be billed under their insurance.  Patient and/or legal guardian expressed understanding and consented to Telemedicine visit: Yes   Presenting Concerns: Patient and/or family reports the following symptoms/concerns: Pt taking hydroxyzine only as needed (not taking Zoloft); noticing decreased anxiety for "maybe 30 minutes", followed by "more shaking and anxious" feeling; ongoing poor appetite (anxiety making it difficult to eat; not eating makes her feel more anxious and makes her worry about health effects of not eating). Pt slept 6-7 hours last night; waking up a few times/going back to sleep. Pt looks forward to last day of current employment today; starting orientation in new position. Pt agrees to referral to psychiatry; will inquire today about  psychiatry availability at her PCP, [facility].  Duration of problem: Ongoing with increase over time. ; Severity of problem:  moderately severe  Patient and/or Family's Strengths/Protective Factors: Social connections, Concrete supports in place (healthy food, safe environments, etc.), and Sense of purpose  Goals Addressed: Patient will:  Reduce symptoms of: anxiety, depression, insomnia, and stress   Increase knowledge and/or ability of: stress reduction   Demonstrate ability to: Increase motivation to adhere to plan of care  Progress towards Goals: Ongoing  Interventions: Interventions utilized:  Motivational Interviewing and Medication Monitoring Standardized Assessments completed: GAD-7 and PHQ 9  Patient and/or Family Response: Patient agrees with treatment plan.   Assessment: Patient currently experiencing Generalized anxiety disorder.   Patient may benefit from continued therapeutic intervention  .  Plan: Follow up with behavioral health clinician on : Two weeks Behavioral recommendations:  -Discuss BH medication management availability with PCP today -Accept referral to psychiatry; use [facility] as needed and discussed -Continue plan to celebrate last day at current position today; start orientation this week for new upcoming position -Continue prioritizing spending time off work with supportive friends and family Referral(s): Integrated [facility] (In Clinic) and [facility] (LME/Outside Clinic)  I discussed the assessment and treatment plan with the patient and/or parent/guardian. They were provided an opportunity to ask questions and all were answered. They agreed with the plan and demonstrated an understanding of the instructions.   They were advised to call back or seek an in-person evaluation if the symptoms worsen or if the condition fails to improve as anticipated.  Rae Lips, LCSW     06/22/2023    3:59 PM  04/21/2023    9:17 AM 02/03/2023    9:28 AM 03/31/2022  2:23 PM 02/26/2022    3:33 PM  Depression screen PHQ 2/9  Decreased Interest 2 1 1 1 3   Down, Depressed, Hopeless 1 1 0 1 2  PHQ - 2 Score 3 2 1 2 5   Altered sleeping 2 3 0 0 1  Tired, decreased energy 2 1 1  0 3  Change in appetite 3 3 0 2 1  Feeling bad or failure about yourself  1 1 0 0 1  Trouble concentrating 2 3 0 0 1  Moving slowly or fidgety/restless 0 3 0 0 0  Suicidal thoughts 0 0 0 0 0  PHQ-9 Score 13 16 2 4 12   Difficult doing work/chores   Not difficult at all        06/22/2023    4:00 PM 04/21/2023    9:20 AM 02/03/2023    9:29 AM 03/31/2022    2:27 PM  GAD 7 : Generalized Anxiety Score  Nervous, Anxious, on Edge 3 3 0 3  Control/stop worrying 3 3 0 0  Worry too much - different things 3 3 0 2  Trouble relaxing 3 1 0 1  Restless 3 1 0 0  Easily annoyed or irritable 1 1 0 1  Afraid - awful might happen 1 1 0 1  Total GAD 7 Score 17 13 0 8

## 2023-06-22 ENCOUNTER — Ambulatory Visit: Payer: Medicaid Other | Admitting: Clinical

## 2023-06-22 DIAGNOSIS — F411 Generalized anxiety disorder: Secondary | ICD-10-CM

## 2023-06-22 NOTE — Patient Instructions (Signed)
Center for [facility] at [facility] [address] [place], [place] [address] [phone] (main office) [phone] [facility] office)  [facility]  [address] [place], [place] [address] [phone] or [phone] [facility] 24/7 FOR ANYONE [address] [place], [place]  [phone] Fax: [phone] [email or web address] *Interpreters available *Accepts all insurance and uninsured for Urgent Care needs *Accepts Medicaid and uninsured for outpatient treatment (below)    ONLY FOR [facility]  Below:   Outpatient New Patient Assessment/Therapy Walk-ins:        Monday -Thursday 8am until slots are full.        Every Friday 1pm-4pm  (first come, first served)                   New Patient Psychiatry/Medication Management        Monday-Friday 8am-11am (first come, first served)              For all walk-ins we ask that you arrive by 7:15am, because patients will be seen in the order of arrival.

## 2023-06-30 NOTE — BH Specialist Note (Unsigned)
Integrated Behavioral Health via Telemedicine Visit  06/30/2023 Melissa Guzman [id number]  Number of Integrated Behavioral Health Clinician visits: 3- Third Visit  Session Start time: 1546   Session End time: 1615  Total time in minutes: 29   Referring Provider: Federico Flake, MD Patient/Family location: [facility] Provider location: [facility] at [facility] for [facility]  All persons participating in visit: Patient Federico Flake, MD and [facility] Melissa Guzman   Types of Service: Individual psychotherapy and Video visit  I connected with Melissa Guzman and/or Melissa Guzman's  n/a  via  Telephone or Video Enabled Telemedicine Application  (Video is Caregility application) and verified that I am speaking with the correct person using two identifiers. Discussed confidentiality: Yes   I discussed the limitations of telemedicine and the availability of in person appointments.  Discussed there is a possibility of technology failure and discussed alternative modes of communication if that failure occurs.  I discussed that engaging in this telemedicine visit, they consent to the provision of behavioral healthcare and the services will be billed under their insurance.  Patient and/or legal guardian expressed understanding and consented to Telemedicine visit: Yes   Presenting Concerns: Patient and/or family reports the following symptoms/concerns: Increasing anxiety; grieving loss of cousin one month ago, as well as father (both murdered by gunshot); change of work to less stressful environment.  Duration of problem: Ongoing anxiety with one month most recent loss; Severity of problem:  moderately severe  Patient and/or Family's Strengths/Protective Factors: Social connections, Concrete supports in place (healthy food, safe environments, etc.), Sense of purpose, and Physical Health (exercise, healthy diet, medication compliance, etc.)  Goals Addressed: Patient  will:  Reduce symptoms of: anxiety, depression, and stress   Demonstrate ability to: Increase healthy adjustment to current life circumstances and Increase motivation to adhere to plan of care  Progress towards Goals: Ongoing  Interventions: Interventions utilized:  Medication Monitoring and Supportive Counseling Standardized Assessments completed: Not Needed  Patient and/or Family Response: Patient agrees with treatment plan.   Assessment: Patient currently experiencing Grief; Generalized anxiety disorder  Patient may benefit from continued therapeutic intervention  .  Plan: Follow up with behavioral health clinician on : Two weeks Behavioral recommendations:  -Discuss any changes with BH medication with medical provider -Continue plan to attend upcoming psychiatry appoinmtnet -Continue prioritizing spending time on days off work with supportive friends and family -Continue allowing healthy grieving with friends and family Referral(s): Integrated [employer] (In Clinic)  I discussed the assessment and treatment plan with the patient and/or parent/guardian. They were provided an opportunity to ask questions and all were answered. They agreed with the plan and demonstrated an understanding of the instructions.   They were advised to call back or seek an in-person evaluation if the symptoms worsen or if the condition fails to improve as anticipated.  Rae Lips, LCSW     06/22/2023    3:59 PM 04/21/2023    9:17 AM 02/03/2023    9:28 AM 03/31/2022    2:23 PM 02/26/2022    3:33 PM  Depression screen PHQ 2/9  Decreased Interest 2 1 1 1 3   Down, Depressed, Hopeless 1 1 0 1 2  PHQ - 2 Score 3 2 1 2 5   Altered sleeping 2 3 0 0 1  Tired, decreased energy 2 1 1  0 3  Change in appetite 3 3 0 2 1  Feeling bad or failure about yourself  1 1 0 0  1  Trouble concentrating 2 3 0 0 1  Moving slowly or fidgety/restless 0 3 0 0 0  Suicidal thoughts 0 0 0 0 0  PHQ-9  Score 13 16 2 4 12   Difficult doing work/chores   Not difficult at all        06/22/2023    4:00 PM 04/21/2023    9:20 AM 02/03/2023    9:29 AM 03/31/2022    2:27 PM  GAD 7 : Generalized Anxiety Score  Nervous, Anxious, on Edge 3 3 0 3  Control/stop worrying 3 3 0 0  Worry too much - different things 3 3 0 2  Trouble relaxing 3 1 0 1  Restless 3 1 0 0  Easily annoyed or irritable 1 1 0 1  Afraid - awful might happen 1 1 0 1  Total GAD 7 Score 17 13 0 8

## 2023-07-14 ENCOUNTER — Ambulatory Visit: Payer: Medicaid Other | Admitting: Clinical

## 2023-07-14 DIAGNOSIS — F411 Generalized anxiety disorder: Secondary | ICD-10-CM

## 2023-07-14 DIAGNOSIS — F4321 Adjustment disorder with depressed mood: Secondary | ICD-10-CM

## 2023-07-15 NOTE — Patient Instructions (Signed)
[facility] at [facility] [address] [place], Melissa Guzman [address] [phone] (main office) [phone] (Aquan Kope's office)   

## 2023-07-30 NOTE — Progress Notes (Unsigned)
Psychiatric Initial Adult Assessment  Patient Identification: Melissa Guzman MRN:  [id number] Date of Evaluation:  08/03/2023 Referral Source: ***  Assessment:  Melissa Guzman is a 27 y.o. female with a history of *** HTN who presents to [facility] via video conferencing for initial evaluation of ***.  Patient reports ***  Plan:  # *** Past medication trials:  Status of problem: *** Interventions: -- ***  # *** Past medication trials:  Status of problem: *** Interventions: -- ***  # *** Past medication trials:  Status of problem: *** Interventions: -- ***  Patient was given contact information for behavioral health clinic and was instructed to call 911 for emergencies.   Subjective:  Chief Complaint: No chief complaint on file.   History of Present Illness:  ***  Chart review: -- Referred Sept 2024 by PCP for GAD. Per LCSW note 9/23 and 10/15: patient taking Atarax PRN; not taking Zoloft. Reporting poor appetite related to anxiety. Grieving loss of cousin one month ago, as well as father (both murdered by gunshot); change of work to less stressful environment.  -- Home psych meds Zoloft 50 mg daily (started July 2024) Atarax 10 mg TID PRN   Anxiety Mood Trauma sx Blood pressure postpartum  Past Psychiatric History:  Diagnoses: *** Medication trials: ***Zoloft Previous psychiatrist/therapist: *** Hospitalizations: *** Suicide attempts: *** SIB: *** Hx of violence towards others: *** Current access to guns: *** Hx of trauma/abuse: ***  Previous Psychotropic Medications: {YES/NO:21197}  Substance Abuse History in the last 12 months:  {yes no:314532}  Past Medical History:  Past Medical History:  Diagnosis Date   Acute blood loss anemia 03/01/2022   Anemia    COVID-19 affecting pregnancy in third trimester 02/04/2022   +01/28/2022   Endometritis 03/05/2022   Postpartum septic endometritis 03/09/2022    Past Surgical History:   Procedure Laterality Date   CESAREAN SECTION N/A 03/01/2022   Procedure: CESAREAN SECTION;  Surgeon: Hermina Staggers, MD;  Location: [facility] LD ORS;  Service: Obstetrics;  Laterality: N/A;   IR RADIOLOGIST EVAL & MGMT  03/28/2022   IR RADIOLOGIST EVAL & MGMT  04/10/2022    Family Psychiatric History: ***  Family History: No family history on file.  Social History:   Academic/Vocational: ***  Social History   Socioeconomic History   Marital status: Single    Spouse name: Not on file   Number of children: Not on file   Years of education: Not on file   Highest education level: Not on file  Occupational History   Not on file  Tobacco Use   Smoking status: Never   Smokeless tobacco: Never  Vaping Use   Vaping status: Never Used  Substance and Sexual Activity   Alcohol use: Yes   Drug use: No   Sexual activity: Yes    Birth control/protection: None  Other Topics Concern   Not on file  Social History Narrative   Not on file   Social Determinants of Health   Financial Resource Strain: Not on file  Food Insecurity: No Food Insecurity (05/27/2023)   Hunger Vital Sign    Worried About Running Out of Food in the Last Year: Never true    Ran Out of Food in the Last Year: Never true  Transportation Needs: No Transportation Needs (05/27/2023)   PRAPARE - Transportation    Lack of Transportation (Medical): No    Lack of Transportation (Non-Medical): No  Physical Activity: Not on file  Stress: Not on file  Social Connections: Unknown (02/11/2022)   [facility]   Social Network    Social Network: Not on file    Additional Social History: updated  Allergies:  No Known Allergies  Current Medications: Current Outpatient Medications  Medication Sig Dispense Refill   acetaminophen (TYLENOL) 500 MG tablet Take 2 tablets (1,000 mg total) by mouth every 8 (eight) hours as needed (pain). 60 tablet 0   amLODipine (NORVASC) 5 MG tablet Take 1 tablet (5 mg  total) by mouth daily. 30 tablet 0   hydrOXYzine (ATARAX) 10 MG tablet Take 1 tablet (10 mg total) by mouth 3 (three) times daily as needed. 30 tablet 2   ibuprofen (ADVIL) 600 MG tablet Take 1 tablet (600 mg total) by mouth every 6 (six) hours as needed for moderate pain, cramping, mild pain, headache or fever. 30 tablet 0   potassium chloride SA (KLOR-CON M) 20 MEQ tablet Take 1 tablet (20 mEq total) by mouth 2 (two) times daily. 20 tablet 0   sertraline (ZOLOFT) 50 MG tablet Take 1 tablet (50 mg total) by mouth daily. 30 tablet 2   No current facility-administered medications for this visit.    ROS: Review of Systems  Objective:  Psychiatric Specialty Exam: not currently breastfeeding.There is no height or weight on file to calculate BMI.  General Appearance: {Appearance:22683}  Eye Contact:  {BHH EYE CONTACT:22684}  Speech:  {Speech:22685}  Volume:  {Volume (PAA):22686}  Mood:  {BHH MOOD:22306}  Affect:  {Affect (PAA):22687}  Thought Content: {Thought Content:22690}   Suicidal Thoughts:  {ST/HT (PAA):22692}  Homicidal Thoughts:  {ST/HT (PAA):22692}  Thought Process:  {Thought Process (PAA):22688}  Orientation:  {BHH ORIENTATION (PAA):22689}    Memory: Grossly intact ***  Judgment:  {Judgement (PAA):22694}  Insight:  {Insight (PAA):22695}  Concentration:  {Concentration:21399}  Recall:  not formally assessed ***  Fund of Knowledge: {BHH GOOD/FAIR/POOR:22877}  Language: {BHH GOOD/FAIR/POOR:22877}  Psychomotor Activity:  {Psychomotor (PAA):22696}  Akathisia:  {BHH YES OR NO:22294}  AIMS (if indicated): {Desc; done/not:10129}  Assets:  {Assets (PAA):22698}  ADL's:  {BHH ZOX'W:96045}  Cognition: {chl bhh cognition:304700322}  Sleep:  {BHH GOOD/FAIR/POOR:22877}   PE: General: sits comfortably in view of camera; no acute distress *** Pulm: no increased work of breathing on room air *** MSK: all extremity movements appear intact *** Neuro: no focal neurological deficits  observed *** Gait & Station: unable to assess by video ***   Metabolic Disorder Labs: Lab Results  Component Value Date   HGBA1C 4.9 09/05/2021   No results found for: "PROLACTIN" No results found for: "CHOL", "TRIG", "HDL", "CHOLHDL", "VLDL", "LDLCALC" Lab Results  Component Value Date   TSH 2.190 05/26/2023    Therapeutic Level Labs: No results found for: "LITHIUM" No results found for: "CBMZ" No results found for: "VALPROATE"  Screenings:  GAD-7    Flowsheet Row Integrated Behavioral Health from 06/22/2023 in [facility] [employer] at [facility] for Women Integrated Behavioral Health from 04/21/2023 in [facility] for [employer] [facility] at [facility] for Women Office Visit from 02/03/2023 in [place] [facility] for [facility] Integrated Behavioral Health from 03/31/2022 in [facility] [employer] [facility] at [facility] for Women Office Visit from 02/26/2022 in [facility] for [employer] [facility] at [facility] for Women  Total GAD-7 Score 17 13 0 8 13      PHQ2-9    Flowsheet Row Integrated Behavioral Health from 06/22/2023 in [facility] [employer] at [facility]  for Women Integrated Behavioral Health from 04/21/2023 in [facility] for Women Office Visit from 02/03/2023 in [place] [facility] for Women Integrated Behavioral Health from 03/31/2022 in [facility] at [facility] for Women Office Visit from 02/26/2022 in [facility] at [facility] Women  PHQ-2 Total Score 3 2 1 2 5   PHQ-9 Total Score 13 16 2 4 12       Flowsheet Row ED to Hosp-Admission (Discharged) from 05/26/2023 in [facility] ED from 03/14/2023 in [facility] Emergency Department at [facility] ED from 07/03/2022 in [facility] at [facility] RISK CATEGORY No Risk No Risk No Risk        Collaboration of Care: Collaboration of Care: [facility] OP Collaboration of ONGE:[id number]}  Patient/Guardian was advised Release of Information must be obtained prior to any record release in order to collaborate their care with an outside provider. Patient/Guardian was advised if they have not already done so to contact the registration department to sign all necessary forms in order for [place] to release information regarding their care.   Consent: Patient/Guardian gives verbal consent for treatment and assignment of benefits for services provided during this visit. Patient/Guardian expressed understanding and agreed to proceed.   Televisit via video: I connected with Melissa Guzman on 08/03/23 at  1:00 PM EST by a video enabled telemedicine application and verified that I am speaking with the correct person using two identifiers.  Location: Patient: *** Provider: remote office in [place]   I discussed the limitations of evaluation and management by telemedicine and the availability of in person appointments. The patient expressed understanding and agreed to proceed.  I discussed the assessment and treatment plan with the patient. The patient was provided an opportunity to ask questions and all were answered. The patient agreed with the plan and demonstrated an understanding of the instructions.   The patient was advised to call back or seek an in-person evaluation if the symptoms worsen or if the condition fails to improve as anticipated.  I provided *** minutes dedicated to the care of this patient via video on the date of this encounter to include chart review, face-to-face time with the patient, medication management/counseling ***.  Melissa Guzman A Jetta Murray 11/4/20241:00 PM

## 2023-08-03 ENCOUNTER — Encounter (HOSPITAL_COMMUNITY): Payer: Self-pay

## 2023-08-03 ENCOUNTER — Encounter (HOSPITAL_COMMUNITY): Payer: Medicaid Other | Admitting: Psychiatry

## 2023-08-03 NOTE — Progress Notes (Unsigned)
Psychiatric Initial Adult Assessment  Patient Identification: Melissa Guzman MRN:  [id number] Date of Evaluation:  08/03/2023 Referral Source: Merian Capron, MD  Assessment:  Melissa Guzman is a 27 y.o. female with a history of *** HTN who presents to [facility] via video conferencing for initial evaluation of ***.  Patient reports ***  Plan:  # *** Past medication trials:  Status of problem: *** Interventions: -- ***  # *** Past medication trials:  Status of problem: *** Interventions: -- ***  # *** Past medication trials:  Status of problem: *** Interventions: -- ***  Patient was given contact information for behavioral health clinic and was instructed to call 911 for emergencies.   Subjective:  Chief Complaint: No chief complaint on file.   History of Present Illness:  ***  Chart review: -- Referred Sept 2024 by PCP for GAD. Per LCSW note 9/23 and 10/15: patient taking Atarax PRN; not taking Zoloft. Reporting poor appetite related to anxiety. Grieving loss of cousin one month ago, as well as father (both murdered by gunshot); change of work to less stressful environment.  -- Home psych meds Zoloft 50 mg daily (started July 2024) Atarax 10 mg TID PRN   Anxiety Mood Trauma sx Blood pressure postpartum  Past Psychiatric History:  Diagnoses: *** Medication trials: ***Zoloft Previous psychiatrist/therapist: *** Hospitalizations: *** Suicide attempts: *** SIB: *** Hx of violence towards others: *** Current access to guns: *** Hx of trauma/abuse: ***  Previous Psychotropic Medications: {YES/NO:21197}  Substance Abuse History in the last 12 months:  {yes no:314532}  Past Medical History:  Past Medical History:  Diagnosis Date   Acute blood loss anemia 03/01/2022   Anemia    COVID-19 affecting pregnancy in third trimester 02/04/2022   +01/28/2022   Endometritis 03/05/2022   Postpartum septic endometritis 03/09/2022    Past Surgical  History:  Procedure Laterality Date   CESAREAN SECTION N/A 03/01/2022   Procedure: CESAREAN SECTION;  Surgeon: Hermina Staggers, MD;  Location: [facility] LD ORS;  Service: Obstetrics;  Laterality: N/A;   IR RADIOLOGIST EVAL & MGMT  03/28/2022   IR RADIOLOGIST EVAL & MGMT  04/10/2022    Family Psychiatric History: ***  Family History: No family history on file.  Social History:   Academic/Vocational: ***  Social History   Socioeconomic History   Marital status: Single    Spouse name: Not on file   Number of children: Not on file   Years of education: Not on file   Highest education level: Not on file  Occupational History   Not on file  Tobacco Use   Smoking status: Never   Smokeless tobacco: Never  Vaping Use   Vaping status: Never Used  Substance and Sexual Activity   Alcohol use: Yes   Drug use: No   Sexual activity: Yes    Birth control/protection: None  Other Topics Concern   Not on file  Social History Narrative   Not on file   Social Determinants of Health   Financial Resource Strain: Not on file  Food Insecurity: No Food Insecurity (05/27/2023)   Hunger Vital Sign    Worried About Running Out of Food in the Last Year: Never true    Ran Out of Food in the Last Year: Never true  Transportation Needs: No Transportation Needs (05/27/2023)   PRAPARE - Transportation    Lack of Transportation (Medical): No    Lack of Transportation (Non-Medical): No  Physical Activity: Not on file  Stress: Not  on file  Social Connections: Unknown (02/11/2022)   [facility]   Social Network    Social Network: Not on file    Additional Social History: updated  Allergies:  No Known Allergies  Current Medications: Current Outpatient Medications  Medication Sig Dispense Refill   acetaminophen (TYLENOL) 500 MG tablet Take 2 tablets (1,000 mg total) by mouth every 8 (eight) hours as needed (pain). 60 tablet 0   amLODipine (NORVASC) 5 MG tablet Take 1  tablet (5 mg total) by mouth daily. 30 tablet 0   hydrOXYzine (ATARAX) 10 MG tablet Take 1 tablet (10 mg total) by mouth 3 (three) times daily as needed. 30 tablet 2   ibuprofen (ADVIL) 600 MG tablet Take 1 tablet (600 mg total) by mouth every 6 (six) hours as needed for moderate pain, cramping, mild pain, headache or fever. 30 tablet 0   potassium chloride SA (KLOR-CON M) 20 MEQ tablet Take 1 tablet (20 mEq total) by mouth 2 (two) times daily. 20 tablet 0   sertraline (ZOLOFT) 50 MG tablet Take 1 tablet (50 mg total) by mouth daily. 30 tablet 2   No current facility-administered medications for this visit.    ROS: Review of Systems  Objective:  Psychiatric Specialty Exam: not currently breastfeeding.There is no height or weight on file to calculate BMI.  General Appearance: {Appearance:22683}  Eye Contact:  {BHH EYE CONTACT:22684}  Speech:  {Speech:22685}  Volume:  {Volume (PAA):22686}  Mood:  {BHH MOOD:22306}  Affect:  {Affect (PAA):22687}  Thought Content: {Thought Content:22690}   Suicidal Thoughts:  {ST/HT (PAA):22692}  Homicidal Thoughts:  {ST/HT (PAA):22692}  Thought Process:  {Thought Process (PAA):22688}  Orientation:  {BHH ORIENTATION (PAA):22689}    Memory: Grossly intact ***  Judgment:  {Judgement (PAA):22694}  Insight:  {Insight (PAA):22695}  Concentration:  {Concentration:21399}  Recall:  not formally assessed ***  Fund of Knowledge: {BHH GOOD/FAIR/POOR:22877}  Language: {BHH GOOD/FAIR/POOR:22877}  Psychomotor Activity:  {Psychomotor (PAA):22696}  Akathisia:  {BHH YES OR NO:22294}  AIMS (if indicated): {Desc; done/not:10129}  Assets:  {Assets (PAA):22698}  ADL's:  {BHH ZOX'W:96045}  Cognition: {chl bhh cognition:304700322}  Sleep:  {BHH GOOD/FAIR/POOR:22877}   PE: General: sits comfortably in view of camera; no acute distress *** Pulm: no increased work of breathing on room air *** MSK: all extremity movements appear intact *** Neuro: no focal neurological  deficits observed *** Gait & Station: unable to assess by video ***   Metabolic Disorder Labs: Lab Results  Component Value Date   HGBA1C 4.9 09/05/2021   No results found for: "PROLACTIN" No results found for: "CHOL", "TRIG", "HDL", "CHOLHDL", "VLDL", "LDLCALC" Lab Results  Component Value Date   TSH 2.190 05/26/2023    Therapeutic Level Labs: No results found for: "LITHIUM" No results found for: "CBMZ" No results found for: "VALPROATE"  Screenings:  GAD-7    Flowsheet Row Integrated Behavioral Health from 06/22/2023 in [facility] [employer] at [facility] for [facility] Integrated Behavioral Health from 04/21/2023 in [facility] at [facility] for Women Office Visit from 02/03/2023 in [place] [facility] for Women Integrated Behavioral Health from 03/31/2022 in [facility] at [facility] for Women Office Visit from 02/26/2022 in [facility] at [facility] for Women  Total GAD-7 Score 17 13 0 8 13      PHQ2-9    Flowsheet Row Integrated Behavioral Health from 06/22/2023 in [facility] [employer] at  [facility] for Women Integrated Behavioral Health from 04/21/2023 in [facility] for Women Office Visit from 02/03/2023 in [place] [facility] at [facility] for Women Integrated Behavioral Health from 03/31/2022 in [facility] at [facility] for Women Office Visit from 02/26/2022 in [facility] at [facility] for Women  PHQ-2 Total Score 3 2 1 2 5   PHQ-9 Total Score 13 16 2 4 12       Flowsheet Row ED to Hosp-Admission (Discharged) from 05/26/2023 in [facility] UN[facility] ED from 03/14/2023 in [facility] Emergency Department at [facility] ED from 07/03/2022 in [facility] at [facility] RISK CATEGORY No Risk No Risk No Risk        Collaboration of Care: Collaboration of Care: [facility] OP Collaboration of UJWJ:[id number]}  Patient/Guardian was advised Release of Information must be obtained prior to any record release in order to collaborate their care with an outside provider. Patient/Guardian was advised if they have not already done so to contact the registration department to sign all necessary forms in order for [place] to release information regarding their care.   Consent: Patient/Guardian gives verbal consent for treatment and assignment of benefits for services provided during this visit. Patient/Guardian expressed understanding and agreed to proceed.   Televisit via video: I connected with Melissa Guzman on 08/03/23 at  1:00 PM EST by a video enabled telemedicine application and verified that I am speaking with the correct person using two identifiers.  Location: Patient: *** Provider: remote office in    I discussed the limitations of evaluation and management by telemedicine and the availability of in person appointments. The patient expressed understanding and agreed to proceed.  I discussed the assessment and treatment plan with the patient. The patient was provided an opportunity to ask questions and all were answered. The patient agreed with the plan and demonstrated an understanding of the instructions.   The patient was advised to call back or seek an in-person evaluation if the symptoms worsen or if the condition fails to improve as anticipated.  I provided *** minutes dedicated to the care of this patient via video on the date of this encounter to include chart review, face-to-face time with the patient, medication management/counseling ***.  Melissa Guzman A Azula Zappia 11/4/20243:35 PM

## 2023-08-04 ENCOUNTER — Other Ambulatory Visit: Payer: Self-pay | Admitting: Family Medicine

## 2023-08-04 DIAGNOSIS — F53 Postpartum depression: Secondary | ICD-10-CM

## 2023-08-05 ENCOUNTER — Encounter (HOSPITAL_COMMUNITY): Payer: Self-pay

## 2023-08-05 ENCOUNTER — Encounter (HOSPITAL_COMMUNITY): Payer: Medicaid Other | Admitting: Psychiatry

## 2023-08-13 ENCOUNTER — Encounter (HOSPITAL_COMMUNITY): Payer: Self-pay

## 2023-08-13 ENCOUNTER — Ambulatory Visit (HOSPITAL_COMMUNITY): Payer: Medicaid Other | Admitting: Clinical

## 2023-08-13 DIAGNOSIS — F411 Generalized anxiety disorder: Secondary | ICD-10-CM | POA: Diagnosis not present

## 2023-08-13 NOTE — Progress Notes (Signed)
Comprehensive Clinical Assessment (CCA) Note  08/13/2023 Melissa Guzman [id number]  Virtual Visit via Video Note  I connected with Melissa Guzman on 08/13/23 at 11:00 AM EST by a video enabled telemedicine application and verified that I am speaking with the correct person using two identifiers.  Location: Patient: work Provider: office   I discussed the limitations of evaluation and management by telemedicine and the availability of in person appointments. The patient expressed understanding and agreed to proceed.   Follow Up Instructions: I discussed the assessment and treatment plan with the patient. The patient was provided an opportunity to ask questions and all were answered. The patient agreed with the plan and demonstrated an understanding of the instructions.   The patient was advised to call back or seek an in-person evaluation if the symptoms worsen or if the condition fails to improve as anticipated.  I provided 25 minutes of non-face-to-face time during this encounter.   Melissa Fee, LCSW   Chief Complaint:  Chief Complaint  Patient presents with   Anxiety   Visit Diagnosis:   Generalized anxiety disorder   Interpretive Summary:  Client is a 27 year old female presenting to the [facility] health center. Client is presenting by referral of her [place] provider for a assessment. Client reported she has a history of generalized disorder. Client reported since the pregnancy with her daughter in 2022 she has had reoccurring issues with high blood pressure and anxiety. Client reported her anxiety medication may be causing her symptoms to worsen. Client reported having deep dreams but when she wakes up, she feels in a panic. Client reported the dreams are bad. Client reported she is currently taking sertraline. Client reported being up and wondering trying to find something to do. client reported shakiness and heart pounding. Client reported it takes her an  hour and half to get where she can type or write. Client reported she tries to calm herself but that is hard to do. client reported before pregnancy she did not have issues with anxiety. Client reported being worried about something going wrong or what she is doing wrong. Client reported no other history of inpatient and/or outpatient treatment for mental health. Client denied illicit substance use. Client presented oriented times five, appropriately dressed and friendly. Client denied hallucinations, deusions, suicidal and homicidal ideations. Client was screened for pain, nutrition,[place] suicide severity and the following SDOH:    08/13/2023   11:23 AM 06/22/2023    4:00 PM 04/21/2023    9:20 AM 02/03/2023    9:29 AM  GAD 7 : Generalized Anxiety Score  Nervous, Anxious, on Edge 3 3 3  0  Control/stop worrying 3 3 3  0  Worry too much - different things 3 3 3  0  Trouble relaxing 3 3 1  0  Restless 3 3 1  0  Easily annoyed or irritable 3 1 1  0  Afraid - awful might happen 3 1 1  0  Total GAD 7 Score 21 17 13  0  Anxiety Difficulty Extremely difficult      Treatment Recommendations: individual therapy and medication management   CCA Biopsychosocial Intake/Chief Complaint:  client reported she is referred by [facility] health provider. Client is presenting due to having high blood pressure which caused anxiety and feeling overwhelmed. client reported she has taken anxiety since end of 2022.  Current Symptoms/Problems: client reported worrying, heart palpitations,  Patient Reported Schizophrenia/Schizoaffective Diagnosis in Past: No  Strengths: voluntarily engaging in follow up referrals  Preferences: counseling and medication  management  Abilities: vocalize problems and needs  Type of Services Patient Feels are Needed: individual therapy and psychiatry  Initial Clinical Notes/Concerns: No data recorded  Mental Health Symptoms Depression:  None   Duration of Depressive symptoms: No data  recorded  Mania:  None   Anxiety:   Difficulty concentrating; Sleep; Tension; Worrying; Irritability; Restlessness   Psychosis:  No data recorded  Duration of Psychotic symptoms: No data recorded  Trauma:  None   Obsessions:  None   Compulsions:  None   Inattention:  None   Hyperactivity/Impulsivity:  None   Oppositional/Defiant Behaviors:  None   Emotional Irregularity:  None   Other Mood/Personality Symptoms:  No data recorded   Mental Status Exam Appearance and self-care  Stature:  Average   Weight:  Average weight   Clothing:  Casual   Grooming:  Normal   Cosmetic use:  Age appropriate   Posture/gait:  Normal   Motor activity:  Not Remarkable   Sensorium  Attention:  Normal   Concentration:  Normal   Orientation:  X5   Recall/memory:  Normal   Affect and Mood  Affect:  Anxious   Mood:  Anxious   Relating  Eye contact:  Normal   Facial expression:  Responsive   Attitude toward examiner:  Cooperative   Thought and Language  Speech flow: Clear and Coherent   Thought content:  Appropriate to Mood and Circumstances   Preoccupation:  None   Hallucinations:  None   Organization:  No data recorded  [employer] of Knowledge:  Good   Intelligence:  Average   Abstraction:  Normal   Judgement:  Good   Reality Testing:  Adequate   Insight:  Good   Decision Making:  Normal   Social Functioning  Social Maturity:  Responsible   Social Judgement:  Normal   Stress  Stressors:  Transitions   Coping Ability:  Set designer Deficits:  Activities of daily living   Supports:  Family     Religion: Religion/Spirituality Are You A Religious Person?: No  Leisure/Recreation: Leisure / Recreation Do You Have Hobbies?: No  Exercise/Diet: Exercise/Diet Do You Exercise?: No Have You Gained or Lost A Significant Amount of Weight in the Past Six Months?: No Do You Follow a Special Diet?: No Do You Have Any Trouble  Sleeping?: Yes   CCA Employment/Education Employment/Work Situation: Employment / Work Situation Employment Situation: Employed 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  Childhood History:  Childhood History Additional childhood history information: client reported she was born and [place]. client reported she was raised by her mother and step father. client reported her childhood was "fair". Patient's description of current relationship with people who raised him/her: client reported her biological father passed away 3 years ago. client reported she and her mother have an okay relationship. Does patient have siblings?: Yes Number of Siblings: 5 Description of patient's current relationship with siblings: client reported she has a good relationship with her siblings. Did patient suffer any verbal/emotional/physical/sexual abuse as a child?: No Did patient suffer from severe childhood neglect?: No Has patient ever been sexually abused/assaulted/raped as an  adolescent or adult?: No Was the patient ever a victim of a crime or a disaster?: No Witnessed domestic violence?: No Has patient been affected by domestic violence as an adult?: No  Child/Adolescent Assessment:     CCA Substance Use Alcohol/Drug Use: Alcohol / Drug Use History of alcohol / drug use?: No history of alcohol / drug abuse                         ASAM's:  Six Dimensions of Multidimensional Assessment  Dimension 1:  Acute Intoxication and/or Withdrawal Potential:      Dimension 2:   Biomedical Conditions and Complications:      Dimension 3:  Emotional, Behavioral, or Cognitive Conditions and Complications:     Dimension 4:  Readiness to Change:     Dimension 5:  Relapse, Continued use, or Continued Problem Potential:     Dimension 6:  Recovery/Living Environment:     ASAM Severity Score:    ASAM Recommended Level of Treatment:     Substance use Disorder (SUD)    Recommendations for Services/Supports/Treatments: Recommendations for Services/Supports/Treatments Recommendations For Services/Supports/Treatments: Medication Management, Individual Therapy  DSM5 Diagnoses: Patient Active Problem List   Diagnosis Date Noted   UTI (urinary tract infection) 05/26/2023   Essential hypertension 04/16/2022   S/P cesarean section 03/01/2022   History of gestational hypertension 03/01/2022   Anxiety and depression 01/17/2022   Alpha thalassemia silent carrier 09/26/2021   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 Patient/Guardian was advised if they have not already done so to contact the registration department to sign all necessary forms in order for [place] to release information regarding their care.   Consent: Patient/Guardian gives verbal consent for treatment and assignment of benefits for services provided during this visit. Patient/Guardian expressed understanding and agreed to  proceed.   Neena Rhymes Suzi Hernan, LCSW

## 2023-08-16 ENCOUNTER — Encounter: Payer: Self-pay | Admitting: Family Medicine

## 2023-08-19 ENCOUNTER — Encounter: Payer: Self-pay | Admitting: General Practice

## 2023-08-19 ENCOUNTER — Other Ambulatory Visit: Payer: Self-pay

## 2023-08-19 ENCOUNTER — Ambulatory Visit: Payer: Medicaid Other

## 2023-08-19 VITALS — BP 127/86 | HR 84 | Ht 61.0 in | Wt 144.0 lb

## 2023-08-19 DIAGNOSIS — G44209 Tension-type headache, unspecified, not intractable: Secondary | ICD-10-CM

## 2023-08-19 DIAGNOSIS — Z3202 Encounter for pregnancy test, result negative: Secondary | ICD-10-CM

## 2023-08-19 DIAGNOSIS — N3000 Acute cystitis without hematuria: Secondary | ICD-10-CM

## 2023-08-19 DIAGNOSIS — R112 Nausea with vomiting, unspecified: Secondary | ICD-10-CM

## 2023-08-19 LAB — POCT URINALYSIS DIP (DEVICE)
Bilirubin Urine: NEGATIVE
Glucose, UA: NEGATIVE mg/dL
Hgb urine dipstick: NEGATIVE
Ketones, ur: NEGATIVE mg/dL
Leukocytes,Ua: NEGATIVE
Nitrite: POSITIVE — AB
Protein, ur: 30 mg/dL — AB
Specific Gravity, Urine: 1.025 (ref 1.005–1.030)
Urobilinogen, UA: 0.2 mg/dL (ref 0.0–1.0)
pH: 6 (ref 5.0–8.0)

## 2023-08-19 LAB — POCT PREGNANCY, URINE: Preg Test, Ur: NEGATIVE

## 2023-08-19 MED ORDER — NITROFURANTOIN MONOHYD MACRO 100 MG PO CAPS
100.0000 mg | ORAL_CAPSULE | Freq: Two times a day (BID) | ORAL | 0 refills | Status: AC
Start: 2023-08-19 — End: 2023-08-24

## 2023-08-19 MED ORDER — ACETAMINOPHEN 500 MG PO TABS
500.0000 mg | ORAL_TABLET | Freq: Once | ORAL | Status: AC
Start: 2023-08-19 — End: 2023-08-19
  Administered 2023-08-19: 500 mg via ORAL

## 2023-08-19 MED ORDER — ONDANSETRON HCL 4 MG PO TABS: 4.0000 mg | ORAL_TABLET | Freq: Three times a day (TID) | ORAL | 0 refills | Status: DC | PRN

## 2023-08-19 NOTE — Progress Notes (Signed)
Patient presents to office today reporting pelvic pressure and low back pain off/on for the past month as well as urinary frequency- no dysuria, no incomplete emptying. She reports her bladder feels "heavy". Patient states she has also noticed having more frequent bowel movements  over the past month to where she is going 3-4 times a day and is having multiple episodes of vomiting most days out of the week in the morning. Patient was treated for a UTI back in May and was admitted to the hospital in August for pyelonephritis. She would also like a pregnancy test while here. UPT -. UA suggestive of UTI. Will send in macrobid per protocol for UTI and ordered urine culture. Zofran sent to pharmacy until she can get in with her PCP for nausea/stomach issues per Edd Arbour. Patient also requested tylenol for headache. Tylenol given in office.   Chase Caller RN BSN 08/19/23

## 2023-08-25 LAB — URINE CULTURE

## 2023-08-28 ENCOUNTER — Emergency Department (HOSPITAL_BASED_OUTPATIENT_CLINIC_OR_DEPARTMENT_OTHER)
Admission: EM | Admit: 2023-08-28 | Discharge: 2023-08-28 | Disposition: A | Discharge status: Home / Self Care | Payer: Medicaid Other | Attending: Emergency Medicine | Admitting: Emergency Medicine

## 2023-08-28 ENCOUNTER — Encounter (HOSPITAL_BASED_OUTPATIENT_CLINIC_OR_DEPARTMENT_OTHER): Payer: Self-pay

## 2023-08-28 ENCOUNTER — Other Ambulatory Visit: Payer: Self-pay

## 2023-08-28 ENCOUNTER — Emergency Department (HOSPITAL_BASED_OUTPATIENT_CLINIC_OR_DEPARTMENT_OTHER): Payer: Medicaid Other | Admitting: Radiology

## 2023-08-28 ENCOUNTER — Other Ambulatory Visit (HOSPITAL_BASED_OUTPATIENT_CLINIC_OR_DEPARTMENT_OTHER): Payer: Self-pay

## 2023-08-28 DIAGNOSIS — R112 Nausea with vomiting, unspecified: Secondary | ICD-10-CM | POA: Insufficient documentation

## 2023-08-28 DIAGNOSIS — D72829 Elevated white blood cell count, unspecified: Secondary | ICD-10-CM | POA: Insufficient documentation

## 2023-08-28 DIAGNOSIS — R1013 Epigastric pain: Secondary | ICD-10-CM | POA: Diagnosis present

## 2023-08-28 DIAGNOSIS — N390 Urinary tract infection, site not specified: Secondary | ICD-10-CM | POA: Diagnosis not present

## 2023-08-28 DIAGNOSIS — R000 Tachycardia, unspecified: Secondary | ICD-10-CM | POA: Diagnosis not present

## 2023-08-28 LAB — COMPREHENSIVE METABOLIC PANEL
ALT: 15 U/L (ref 0–44)
AST: 48 U/L — ABNORMAL HIGH (ref 15–41)
Albumin: 4.9 g/dL (ref 3.5–5.0)
Alkaline Phosphatase: 75 U/L (ref 38–126)
Anion gap: 16 — ABNORMAL HIGH (ref 5–15)
BUN: 10 mg/dL (ref 6–20)
CO2: 26 mmol/L (ref 22–32)
Calcium: 9.7 mg/dL (ref 8.9–10.3)
Chloride: 93 mmol/L — ABNORMAL LOW (ref 98–111)
Creatinine, Ser: 0.79 mg/dL (ref 0.44–1.00)
GFR, Estimated: >60 mL/min (ref >60)
Glucose, Bld: 97 mg/dL (ref 70–99)
Potassium: 3.5 mmol/L (ref 3.5–5.1)
Sodium: 135 mmol/L (ref 135–145)
Total Bilirubin: 0.8 mg/dL (ref <1.2)
Total Protein: 8.7 g/dL — ABNORMAL HIGH (ref 6.5–8.1)

## 2023-08-28 LAB — URINALYSIS, ROUTINE W REFLEX MICROSCOPIC
Bilirubin Urine: NEGATIVE
Glucose, UA: NEGATIVE mg/dL
Ketones, ur: >80 mg/dL — AB
Nitrite: POSITIVE — AB
Protein, ur: 100 mg/dL — AB
Specific Gravity, Urine: 1.016 (ref 1.005–1.030)
WBC, UA: >50 WBC/hpf (ref 0–5)
pH: 6 (ref 5.0–8.0)

## 2023-08-28 LAB — CBC
HCT: 31.2 % — ABNORMAL LOW (ref 36.0–46.0)
Hemoglobin: 10.2 g/dL — ABNORMAL LOW (ref 12.0–15.0)
MCH: 32.1 pg (ref 26.0–34.0)
MCHC: 32.7 g/dL (ref 30.0–36.0)
MCV: 98.1 fL (ref 80.0–100.0)
Platelets: 208 10*3/uL (ref 150–400)
RBC: 3.18 MIL/uL — ABNORMAL LOW (ref 3.87–5.11)
RDW: 14.8 % (ref 11.5–15.5)
WBC: 13 10*3/uL — ABNORMAL HIGH (ref 4.0–10.5)
nRBC: 0 % (ref 0.0–0.2)

## 2023-08-28 LAB — PREGNANCY, URINE: Preg Test, Ur: NEGATIVE

## 2023-08-28 LAB — LIPASE, BLOOD: Lipase: 19 U/L (ref 11–51)

## 2023-08-28 LAB — TROPONIN I (HIGH SENSITIVITY): Troponin I (High Sensitivity): 3 ng/L (ref <18)

## 2023-08-28 MED ORDER — ONDANSETRON 4 MG PO TBDP
4.0000 mg | ORAL_TABLET | Freq: Once | ORAL | Status: AC
  Administered 2023-08-28: 4 mg via ORAL
  Filled 2023-08-28: qty 1

## 2023-08-28 MED ORDER — KETOROLAC TROMETHAMINE 30 MG/ML IJ SOLN
30.0000 mg | Freq: Once | INTRAMUSCULAR | Status: AC
  Administered 2023-08-28: 30 mg via INTRAVENOUS
  Filled 2023-08-28: qty 1

## 2023-08-28 MED ORDER — NITROFURANTOIN MONOHYD MACRO 100 MG PO CAPS
100.0000 mg | ORAL_CAPSULE | Freq: Once | ORAL | Status: AC
  Administered 2023-08-28: 100 mg via ORAL
  Filled 2023-08-28: qty 1

## 2023-08-28 NOTE — Discharge Instructions (Addendum)
Today you were seen for nausea and vomiting.  Please take the medication previously prescribed to you by your OB/GYN as prescribed for your urinary tract infection.  Thank you for letting us treat you today. After reviewing your labs and imaging, I feel you are safe to go home. Please follow up with your PCP in the next several days and provide them with your records from this visit. Return to the Emergency Room if pain becomes severe or symptoms worsen.

## 2023-08-28 NOTE — ED Triage Notes (Signed)
She reports having "chest pain and vomiting "all day today". She tels me she has vomited at least 10 times.

## 2023-08-28 NOTE — ED Notes (Signed)
Pt provided water.  

## 2023-08-28 NOTE — ED Provider Notes (Signed)
[place] EMERGENCY DEPARTMENT AT [facility] Provider Note   CSN: [id number] Arrival date & time: 08/28/23  1406     History  Chief Complaint  Patient presents with   Chest Pain    Melissa Guzman is a 27 y.o. female past medical history significant for anxiety and hypertension presents today for epigastric pain and vomiting since this morning.  Patient states that she has chills, vomiting, and nausea.  Patient denies diarrhea, abdominal pain, fever, cough, sore throat, or bodyaches.  Patient also states that she is sore in her lumbar back region from vomiting.  Patient denies bilious or bloody vomit.  Patient also reports intermittent palpitations and high blood pressure recently patient is not compliant with her Zoloft for anxiety.   Chest Pain Associated symptoms: nausea, palpitations and vomiting        Home Medications Prior to Admission medications   Medication Sig Start Date End Date Taking? Authorizing Provider  acetaminophen (TYLENOL) 500 MG tablet Take 2 tablets (1,000 mg total) by mouth every 8 (eight) hours as needed (pain). 03/05/22   Worthy Rancher, MD  amLODipine (NORVASC) 5 MG tablet Take 1 tablet (5 mg total) by mouth daily. 05/29/23 08/19/23  Dorcas Carrow, MD  hydrOXYzine (ATARAX) 10 MG tablet Take 1 tablet (10 mg total) by mouth 3 (three) times daily as needed. 04/24/23   Venora Maples, MD  ondansetron (ZOFRAN) 4 MG tablet Take 1 tablet (4 mg total) by mouth every 8 (eight) hours as needed for nausea or vomiting. 08/19/23   Bernerd Limbo, CNM  sertraline (ZOLOFT) 50 MG tablet Take 1 tablet (50 mg total) by mouth daily. Patient not taking: Reported on 08/19/2023 04/24/23   Venora Maples, MD      Allergies    Patient has no known allergies.    Review of Systems   Review of Systems  Constitutional:  Positive for chills.  Cardiovascular:  Positive for palpitations.  Gastrointestinal:  Positive for nausea and vomiting.    Physical  Exam Updated Vital Signs BP (!) 146/100   Pulse (!) 129   Temp 99.5 F (37.5 C)   Resp 18   LMP 07/19/2023 (Within Days)   SpO2 99%  Physical Exam Vitals and nursing note reviewed.  Constitutional:      General: She is not in acute distress.    Appearance: She is well-developed. She is not ill-appearing.  HENT:     Head: Normocephalic and atraumatic.  Eyes:     Extraocular Movements: Extraocular movements intact.     Conjunctiva/sclera: Conjunctivae normal.  Cardiovascular:     Rate and Rhythm: Regular rhythm. Tachycardia present.     Heart sounds: No murmur heard. Pulmonary:     Effort: Pulmonary effort is normal. No respiratory distress.     Breath sounds: Normal breath sounds.  Chest:     Chest wall: Tenderness present.     Comments: Reproducible muscular tenderness Abdominal:     General: Bowel sounds are normal.     Palpations: Abdomen is soft.     Tenderness: There is no abdominal tenderness. There is no guarding. Negative signs include Murphy's sign, Rovsing's sign and McBurney's sign.  Musculoskeletal:        General: No swelling.     Cervical back: Neck supple.     Comments: Reproducible muscular tenderness to the paraspinal muscles in the lumbar region.  No radiation of pain, deformity, trauma, or step-offs.  Skin:    General: Skin is warm and  dry.     Capillary Refill: Capillary refill takes less than 2 seconds.  Neurological:     General: No focal deficit present.     Mental Status: She is alert.  Psychiatric:        Mood and Affect: Mood normal.     ED Results / Procedures / Treatments   Labs (all labs ordered are listed, but only abnormal results are displayed) Labs Reviewed  CBC - Abnormal; Notable for the following components:      Result Value   WBC 13.0 (*)    RBC 3.18 (*)    Hemoglobin 10.2 (*)    HCT 31.2 (*)    All other components within normal limits  COMPREHENSIVE METABOLIC PANEL - Abnormal; Notable for the following components:    Chloride 93 (*)    Total Protein 8.7 (*)    AST 48 (*)    Anion gap 16 (*)    All other components within normal limits  URINALYSIS, ROUTINE W REFLEX MICROSCOPIC - Abnormal; Notable for the following components:   APPearance HAZY (*)    Hgb urine dipstick MODERATE (*)    Ketones, ur >80 (*)    Protein, ur 100 (*)    Nitrite POSITIVE (*)    Leukocytes,Ua MODERATE (*)    Bacteria, UA FEW (*)    All other components within normal limits  LIPASE, BLOOD  PREGNANCY, URINE  TROPONIN I (HIGH SENSITIVITY)  TROPONIN I (HIGH SENSITIVITY)    EKG EKG Interpretation Date/Time:  Friday August 28 2023 14:16:06 EST Ventricular Rate:  119 PR Interval:  96 QRS Duration:  76 QT Interval:  318 QTC Calculation: 447 R Axis:   88  Text Interpretation: Sinus tachycardia with short PR ST & T wave abnormality, consider inferior ischemia ST & T wave abnormality, consider anterolateral ischemia No significant change since last tracing When compared with ECG of 26-May-2023 21:16, PREVIOUS ECG IS PRESENT Confirmed by Gwyneth Sprout ([id number]) on 08/28/2023 2:43:17 PM  Radiology DG Chest 2 View  Result Date: 08/28/2023 CLINICAL DATA:  Chest pain and vomiting for 1 day. EXAM: CHEST - 2 VIEW COMPARISON:  09/29/2020 FINDINGS: The heart size and mediastinal contours are within normal limits. Both lungs are clear. The visualized skeletal structures are unremarkable. IMPRESSION: Normal exam. Electronically Signed   By: Danae Orleans M.D.   On: 08/28/2023 16:27    Procedures Procedures    Medications Ordered in ED Medications  ketorolac (TORADOL) 30 MG/ML injection 30 mg (has no administration in time range)  nitrofurantoin (macrocrystal-monohydrate) (MACROBID) capsule 100 mg (has no administration in time range)  ondansetron (ZOFRAN-ODT) disintegrating tablet 4 mg (4 mg Oral Given 08/28/23 1625)    ED Course/ Medical Decision Making/ A&P                                 Medical Decision Making Amount  and/or Complexity of Data Reviewed Labs: ordered. Radiology: ordered.   This patient presents to the ED with chief complaint(s) of epigastric pain and vomiting with pertinent past medical history of anxiety which further complicates the presenting complaint. The complaint involves an extensive differential diagnosis and also carries with it a high risk of complications and morbidity.    The differential diagnosis includes gastritis, appendicitis, UTI  Additional history obtained: Records reviewed Primary Care Documents  ED Course and Reassessment:   Independent labs interpretation:  The following labs were independently interpreted:  EKG:  Sinus tachycardia, ST and T wave abnormalities, similar to previous EKG CBC: Mild leukocytosis likely due to vomiting, anemia at 10.2 which is chronic per historical values CMP: Mild hypochloremia, mildly elevated anion gap at 16, mildly elevated AST, mildly elevated total protein Lipase: 19 Pregnancy serum: Negative Troponin: 3 UA: Moderate hemoglobin, greater than 80 ketones, moderate leuks, nitrate positive, few bacteria, 100 protein  Independent visualization of imaging: - I independently visualized the following imaging with scope of interpretation limited to determining acute life threatening conditions related to emergency care: Chest x-[place], which revealed no acute cardiopulmonary findings  Consultation: - Consulted or discussed management/test interpretation w/ external professional: None  Consideration for admission or further workup: Considered for admission or further workup however patient's physical exam, labs, and imaging have all been reassuring.  Patient was recently diagnosed with a UTI 9 days ago but only picked up her antibiotic today.  Patient has Zofran but did not take it prior to first dose and subsequently threw it up.  Patient will be given 1 dose here prior to discharge.        Final Clinical Impression(s) / ED  Diagnoses Final diagnoses:  Nausea and vomiting, unspecified vomiting type  Urinary tract infection with hematuria, site unspecified    Rx / DC Orders ED Discharge Orders     None         Dolphus Jenny, PA-C 08/28/23 1717    Benjiman Core, MD 08/29/23 0001

## 2023-09-09 ENCOUNTER — Encounter (HOSPITAL_COMMUNITY): Payer: Self-pay

## 2023-09-09 ENCOUNTER — Ambulatory Visit (HOSPITAL_COMMUNITY): Payer: Medicaid Other | Admitting: Clinical

## 2023-10-04 ENCOUNTER — Other Ambulatory Visit: Payer: Self-pay

## 2023-10-04 ENCOUNTER — Emergency Department (HOSPITAL_BASED_OUTPATIENT_CLINIC_OR_DEPARTMENT_OTHER)
Admission: EM | Admit: 2023-10-04 | Discharge: 2023-10-04 | Disposition: A | Discharge status: Home / Self Care | Payer: Medicaid Other | Attending: Emergency Medicine | Admitting: Emergency Medicine

## 2023-10-04 ENCOUNTER — Emergency Department (HOSPITAL_BASED_OUTPATIENT_CLINIC_OR_DEPARTMENT_OTHER): Payer: Medicaid Other

## 2023-10-04 ENCOUNTER — Emergency Department (HOSPITAL_BASED_OUTPATIENT_CLINIC_OR_DEPARTMENT_OTHER): Payer: Medicaid Other | Admitting: Radiology

## 2023-10-04 ENCOUNTER — Encounter (HOSPITAL_BASED_OUTPATIENT_CLINIC_OR_DEPARTMENT_OTHER): Payer: Self-pay | Admitting: Emergency Medicine

## 2023-10-04 DIAGNOSIS — Z20822 Contact with and (suspected) exposure to covid-19: Secondary | ICD-10-CM | POA: Insufficient documentation

## 2023-10-04 DIAGNOSIS — K760 Fatty (change of) liver, not elsewhere classified: Secondary | ICD-10-CM | POA: Diagnosis not present

## 2023-10-04 DIAGNOSIS — R7401 Elevation of levels of liver transaminase levels: Secondary | ICD-10-CM | POA: Insufficient documentation

## 2023-10-04 DIAGNOSIS — R748 Abnormal levels of other serum enzymes: Secondary | ICD-10-CM | POA: Insufficient documentation

## 2023-10-04 DIAGNOSIS — E876 Hypokalemia: Secondary | ICD-10-CM | POA: Diagnosis not present

## 2023-10-04 DIAGNOSIS — R002 Palpitations: Secondary | ICD-10-CM | POA: Diagnosis present

## 2023-10-04 DIAGNOSIS — R112 Nausea with vomiting, unspecified: Secondary | ICD-10-CM

## 2023-10-04 DIAGNOSIS — E8729 Other acidosis: Secondary | ICD-10-CM | POA: Insufficient documentation

## 2023-10-04 DIAGNOSIS — R7989 Other specified abnormal findings of blood chemistry: Secondary | ICD-10-CM

## 2023-10-04 DIAGNOSIS — R000 Tachycardia, unspecified: Secondary | ICD-10-CM | POA: Diagnosis not present

## 2023-10-04 DIAGNOSIS — F419 Anxiety disorder, unspecified: Secondary | ICD-10-CM | POA: Diagnosis not present

## 2023-10-04 HISTORY — DX: Essential (primary) hypertension: I10

## 2023-10-04 LAB — BASIC METABOLIC PANEL
Anion gap: 23 — ABNORMAL HIGH (ref 5–15)
Anion gap: 18 — ABNORMAL HIGH (ref 5–15)
BUN: 9 mg/dL (ref 6–20)
BUN: 6 mg/dL (ref 6–20)
CO2: 21 mmol/L — ABNORMAL LOW (ref 22–32)
CO2: 23 mmol/L (ref 22–32)
Calcium: 9.2 mg/dL (ref 8.9–10.3)
Calcium: 9.1 mg/dL (ref 8.9–10.3)
Chloride: 96 mmol/L — ABNORMAL LOW (ref 98–111)
Chloride: 96 mmol/L — ABNORMAL LOW (ref 98–111)
Creatinine, Ser: 0.84 mg/dL (ref 0.44–1.00)
Creatinine, Ser: 0.73 mg/dL (ref 0.44–1.00)
GFR, Estimated: >60 mL/min (ref >60)
GFR, Estimated: >60 mL/min (ref >60)
Glucose, Bld: 72 mg/dL (ref 70–99)
Glucose, Bld: 78 mg/dL (ref 70–99)
Potassium: 2.8 mmol/L — ABNORMAL LOW (ref 3.5–5.1)
Potassium: 3.2 mmol/L — ABNORMAL LOW (ref 3.5–5.1)
Sodium: 140 mmol/L (ref 135–145)
Sodium: 137 mmol/L (ref 135–145)

## 2023-10-04 LAB — HEPATIC FUNCTION PANEL
ALT: 70 U/L — ABNORMAL HIGH (ref 0–44)
AST: 185 U/L — ABNORMAL HIGH (ref 15–41)
Albumin: 4.7 g/dL (ref 3.5–5.0)
Alkaline Phosphatase: 88 U/L (ref 38–126)
Bilirubin, Direct: 0.1 mg/dL (ref 0.0–0.2)
Indirect Bilirubin: 0.3 mg/dL (ref 0.3–0.9)
Total Bilirubin: 0.4 mg/dL (ref 0.0–1.2)
Total Protein: 8.3 g/dL — ABNORMAL HIGH (ref 6.5–8.1)

## 2023-10-04 LAB — CBC
HCT: 33 % — ABNORMAL LOW (ref 36.0–46.0)
Hemoglobin: 10.9 g/dL — ABNORMAL LOW (ref 12.0–15.0)
MCH: 31.2 pg (ref 26.0–34.0)
MCHC: 33 g/dL (ref 30.0–36.0)
MCV: 94.6 fL (ref 80.0–100.0)
Platelets: 149 10*3/uL — ABNORMAL LOW (ref 150–400)
RBC: 3.49 MIL/uL — ABNORMAL LOW (ref 3.87–5.11)
RDW: 14 % (ref 11.5–15.5)
WBC: 4.3 10*3/uL (ref 4.0–10.5)
nRBC: 0 % (ref 0.0–0.2)

## 2023-10-04 LAB — MAGNESIUM: Magnesium: 1.2 mg/dL — ABNORMAL LOW (ref 1.7–2.4)

## 2023-10-04 LAB — URINALYSIS, W/ REFLEX TO CULTURE (INFECTION SUSPECTED)
Bilirubin Urine: NEGATIVE
Glucose, UA: NEGATIVE mg/dL
Ketones, ur: >80 mg/dL — AB
Leukocytes,Ua: NEGATIVE
Nitrite: NEGATIVE
Protein, ur: 30 mg/dL — AB
Specific Gravity, Urine: 1.021 (ref 1.005–1.030)
pH: 6 (ref 5.0–8.0)

## 2023-10-04 LAB — RAPID URINE DRUG SCREEN, HOSP PERFORMED
Amphetamines: NOT DETECTED
Barbiturates: NOT DETECTED
Benzodiazepines: NOT DETECTED
Cocaine: NOT DETECTED
Opiates: NOT DETECTED
Tetrahydrocannabinol: NOT DETECTED

## 2023-10-04 LAB — D-DIMER, QUANTITATIVE: D-Dimer, Quant: 1.15 ug{FEU}/mL — ABNORMAL HIGH (ref 0.00–0.50)

## 2023-10-04 LAB — TROPONIN I (HIGH SENSITIVITY)
Troponin I (High Sensitivity): 3 ng/L (ref <18)
Troponin I (High Sensitivity): 4 ng/L (ref <18)

## 2023-10-04 LAB — LIPASE, BLOOD: Lipase: 187 U/L — ABNORMAL HIGH (ref 11–51)

## 2023-10-04 LAB — RESP PANEL BY RT-PCR (RSV, FLU A&B, COVID)  RVPGX2
Influenza A by PCR: NEGATIVE
Influenza B by PCR: NEGATIVE
Resp Syncytial Virus by PCR: NEGATIVE
SARS Coronavirus 2 by RT PCR: NEGATIVE

## 2023-10-04 LAB — PREGNANCY, URINE: Preg Test, Ur: NEGATIVE

## 2023-10-04 MED ORDER — SODIUM CHLORIDE 0.9 % IV BOLUS
1000.0000 mL | Freq: Once | INTRAVENOUS | Status: AC
  Administered 2023-10-04: 1000 mL via INTRAVENOUS

## 2023-10-04 MED ORDER — MAGNESIUM SULFATE 2 GM/50ML IV SOLN
2.0000 g | Freq: Once | INTRAVENOUS | Status: AC
  Administered 2023-10-04: 2 g via INTRAVENOUS
  Filled 2023-10-04: qty 50

## 2023-10-04 MED ORDER — ONDANSETRON HCL 4 MG/2ML IJ SOLN
4.0000 mg | Freq: Once | INTRAMUSCULAR | Status: AC
  Administered 2023-10-04: 4 mg via INTRAVENOUS
  Filled 2023-10-04: qty 2

## 2023-10-04 MED ORDER — ONDANSETRON HCL 4 MG/2ML IJ SOLN
4.0000 mg | Freq: Once | INTRAMUSCULAR | Status: AC
Start: 2023-10-04 — End: 2023-10-04
  Administered 2023-10-04: 4 mg via INTRAVENOUS
  Filled 2023-10-04: qty 2

## 2023-10-04 MED ORDER — POTASSIUM CHLORIDE 10 MEQ/100ML IV SOLN
10.0000 meq | INTRAVENOUS | Status: AC
Start: 2023-10-04 — End: 2023-10-04
  Administered 2023-10-04 (×3): 10 meq via INTRAVENOUS
  Filled 2023-10-04 (×2): qty 100

## 2023-10-04 MED ORDER — MORPHINE SULFATE (PF) 4 MG/ML IV SOLN
4.0000 mg | Freq: Once | INTRAVENOUS | Status: AC
  Administered 2023-10-04: 4 mg via INTRAVENOUS
  Filled 2023-10-04: qty 1

## 2023-10-04 MED ORDER — ONDANSETRON 4 MG PO TBDP: 4.0000 mg | ORAL_TABLET | Freq: Three times a day (TID) | ORAL | 0 refills | Status: DC | PRN

## 2023-10-04 MED ORDER — SODIUM CHLORIDE 0.9 % IV BOLUS
1000.0000 mL | Freq: Once | INTRAVENOUS | Status: AC
Start: 2023-10-04 — End: 2023-10-04
  Administered 2023-10-04: 1000 mL via INTRAVENOUS

## 2023-10-04 MED ORDER — POTASSIUM CHLORIDE CRYS ER 20 MEQ PO TBCR: 20.0000 meq | EXTENDED_RELEASE_TABLET | Freq: Every day | ORAL | 0 refills | Status: DC

## 2023-10-04 MED ORDER — IOHEXOL 350 MG/ML SOLN
75.0000 mL | Freq: Once | INTRAVENOUS | Status: AC | PRN
  Administered 2023-10-04: 75 mL via INTRAVENOUS

## 2023-10-04 MED ORDER — IOHEXOL 300 MG/ML  SOLN
80.0000 mL | Freq: Once | INTRAMUSCULAR | Status: AC | PRN
  Administered 2023-10-04: 80 mL via INTRAVENOUS

## 2023-10-04 NOTE — Discharge Instructions (Addendum)
 It was a pleasure taking the air in the emergency department.  As discussed in the room you have fatty liver disease. I recommend limiting starchy foods and processed foods.  Would recommend a bland liquid diet over the next 2 days, gradually increasing diet.  I have written for some nausea medication as well as potassium supplementation  Make sure to follow-up outpatient, return for any worsening symptoms

## 2023-10-04 NOTE — ED Notes (Signed)
 She denies chest pain. She seems quite anxious. Her significant other is here with their son.

## 2023-10-04 NOTE — ED Notes (Signed)
 Reviewed AVS/discharge instruction with patient. Time allotted for and all questions answered. Patient is agreeable for d/c and escorted to ed exit by staff.

## 2023-10-04 NOTE — ED Notes (Signed)
 Per EDP order, pt given fluids and/or food for PO challenge. Pt tolerating PO fluids

## 2023-10-04 NOTE — ED Provider Notes (Signed)
 [place] [address] EMERGENCY DEPARTMENT AT [facility] Provider Note   CSN: [id number] Arrival date & time: 10/04/23  1307    History  Chief Complaint  Patient presents with   Tachycardia    Melissa Guzman is a 28 y.o. female here for evaluation of palpitations and emesis over the last 24 hours.  States she developed palpitations after having multiple episodes of NBNB emesis.  She states she gets frequent bouts of vomiting and is not sure entirely why.  Occasionally uses EtOH, marijuana.  She denies any pain.  Specifically no chest pain, abdominal pain.  No dysuria or hematuria.  She is unsure if she could be pregnant as she states her menstrual cycle last month was not normal.  No recent falls or injuries.  No fever, congestion, rhinorrhea, sick contacts.  Having normal bowel movements.  No blood in her stool.  She does feel anxious being in the emergency department.  HPI     Home Medications Prior to Admission medications   Medication Sig Start Date End Date Taking? Authorizing Provider  ondansetron  (ZOFRAN -ODT) 4 MG disintegrating tablet Take 1 tablet (4 mg total) by mouth every 8 (eight) hours as needed. 10/04/23  Yes Brayon Bielefeld A, PA-C  potassium chloride  SA (KLOR-CON  M) 20 MEQ tablet Take 1 tablet (20 mEq total) by mouth daily for 4 days. 10/04/23 10/08/23 Yes Louie Meaders A, PA-C  acetaminophen  (TYLENOL ) 500 MG tablet Take 2 tablets (1,000 mg total) by mouth every 8 (eight) hours as needed (pain). 03/05/22   Clem Tawni HERO, MD  amLODipine  (NORVASC ) 5 MG tablet Take 1 tablet (5 mg total) by mouth daily. 05/29/23 08/19/23  Raenelle Coria, MD  hydrOXYzine  (ATARAX ) 10 MG tablet Take 1 tablet (10 mg total) by mouth 3 (three) times daily as needed. 04/24/23   Lola Donnice HERO, MD  ondansetron  (ZOFRAN ) 4 MG tablet Take 1 tablet (4 mg total) by mouth every 8 (eight) hours as needed for nausea or vomiting. 08/19/23   Vannie Cornell SAUNDERS, CNM  sertraline  (ZOLOFT ) 50 MG tablet Take 1  tablet (50 mg total) by mouth daily. Patient not taking: Reported on 08/19/2023 04/24/23   Lola Donnice HERO, MD      Allergies    Patient has no known allergies.    Review of Systems   Review of Systems  Constitutional: Negative.   HENT: Negative.    Respiratory: Negative.    Cardiovascular: Negative.   Gastrointestinal:  Positive for nausea and vomiting. Negative for abdominal distention, abdominal pain, anal bleeding, blood in stool, constipation, diarrhea and rectal pain.  Genitourinary: Negative.   Musculoskeletal: Negative.   Skin: Negative.   Neurological: Negative.   All other systems reviewed and are negative.   Physical Exam Updated Vital Signs BP (!) 154/97   Pulse 77   Temp 99.1 F (37.3 C) (Oral)   Resp (!) 25   Wt 65.8 kg   LMP 09/13/2023 (Approximate)   SpO2 99%   BMI 27.40 kg/m  Physical Exam Vitals and nursing note reviewed.  Constitutional:      General: She is not in acute distress.    Appearance: She is well-developed. She is not ill-appearing, toxic-appearing or diaphoretic.  HENT:     Head: Normocephalic and atraumatic.     Nose: Nose normal.     Mouth/Throat:     Mouth: Mucous membranes are moist.  Eyes:     Pupils: Pupils are equal, round, and reactive to light.  Cardiovascular:     Rate  and Rhythm: Normal rate.     Pulses: Normal pulses.     Heart sounds: Normal heart sounds.  Pulmonary:     Effort: Pulmonary effort is normal. No respiratory distress.     Breath sounds: Normal breath sounds.  Abdominal:     General: Bowel sounds are normal. There is no distension.     Palpations: Abdomen is soft.  Musculoskeletal:        General: No swelling, tenderness, deformity or signs of injury. Normal range of motion.     Cervical back: Normal range of motion.     Right lower leg: No edema.     Left lower leg: No edema.  Skin:    General: Skin is warm and dry.     Capillary Refill: Capillary refill takes less than 2 seconds.  Neurological:      General: No focal deficit present.     Mental Status: She is alert and oriented to person, place, and time.  Psychiatric:        Mood and Affect: Mood normal.     Comments: Appears anxious     ED Results / Procedures / Treatments   Labs (all labs ordered are listed, but only abnormal results are displayed) Labs Reviewed  BASIC METABOLIC PANEL - Abnormal; Notable for the following components:      Result Value   Potassium 2.8 (*)    Chloride 96 (*)    CO2 21 (*)    Anion gap 23 (*)    All other components within normal limits  CBC - Abnormal; Notable for the following components:   RBC 3.49 (*)    Hemoglobin 10.9 (*)    HCT 33.0 (*)    Platelets 149 (*)    All other components within normal limits  MAGNESIUM  - Abnormal; Notable for the following components:   Magnesium  1.2 (*)    All other components within normal limits  D-DIMER, QUANTITATIVE - Abnormal; Notable for the following components:   D-Dimer, Quant 1.15 (*)    All other components within normal limits  URINALYSIS, W/ REFLEX TO CULTURE (INFECTION SUSPECTED) - Abnormal; Notable for the following components:   Hgb urine dipstick SMALL (*)    Ketones, ur >80 (*)    Protein, ur 30 (*)    Bacteria, UA RARE (*)    All other components within normal limits  HEPATIC FUNCTION PANEL - Abnormal; Notable for the following components:   Total Protein 8.3 (*)    AST 185 (*)    ALT 70 (*)    All other components within normal limits  LIPASE, BLOOD - Abnormal; Notable for the following components:   Lipase 187 (*)    All other components within normal limits  BASIC METABOLIC PANEL - Abnormal; Notable for the following components:   Potassium 3.2 (*)    Chloride 96 (*)    Anion gap 18 (*)    All other components within normal limits  RESP PANEL BY RT-PCR (RSV, FLU A&B, COVID)  RVPGX2  URINE CULTURE  PREGNANCY, URINE  RAPID URINE DRUG SCREEN, HOSP PERFORMED  TROPONIN I (HIGH SENSITIVITY)  TROPONIN I (HIGH  SENSITIVITY)    EKG EKG Interpretation Date/Time:  Sunday October 04 2023 13:25:08 EST Ventricular Rate:  78 PR Interval:  130 QRS Duration:  137 QT Interval:  393 QTC Calculation: 448 R Axis:   68  Text Interpretation: Sinus rhythm no significant change since prior with t wabve changes Confirmed by Ruthe Cornet [phone]) on  10/04/2023 3:35:08 PM  Radiology CT ABDOMEN PELVIS W CONTRAST Result Date: 10/04/2023 CLINICAL DATA:  Epigastric pain, chest pain and abdominal pain. EXAM: CT ABDOMEN AND PELVIS WITH CONTRAST TECHNIQUE: Multidetector CT imaging of the abdomen and pelvis was performed using the standard protocol following bolus administration of intravenous contrast. RADIATION DOSE REDUCTION: This exam was performed according to the departmental dose-optimization program which includes automated exposure control, adjustment of the mA and/or kV according to patient size and/or use of iterative reconstruction technique. CONTRAST:  80mL OMNIPAQUE  IOHEXOL  300 MG/ML  SOLN COMPARISON:  05/26/2023 FINDINGS: Lower chest: No acute abnormality. Hepatobiliary: Marked diffuse hepatic steatosis. More focal fatty deposition is noted about the falciform ligament. No suspicious liver abnormality identified. Pancreas: No main duct dilatation or mass. No significant changes of pancreatitis identified at this time. Specifically there is no peripancreatic fat stranding, free fluid, necrosis or pseudocyst. Spleen: Normal in size without focal abnormality. Adrenals/Urinary Tract: Normal adrenal glands. No hydronephrosis or mass. Urinary bladder appears normal. Stomach/Bowel: Small hiatal hernia. The appendix is visualized and appears normal. No pathologic dilatation of the large or small bowel loops to suggest an obstruction. No bowel wall thickening or inflammatory fat stranding. Vascular/Lymphatic: No significant vascular findings are present. No enlarged abdominal or pelvic lymph nodes. Reproductive: Uterus appears  normal. Prominent bilateral ovaries with multiple peripheral follicles identified measuring up to 1 cm. These appear unchanged from the previous exam. No adnexal mass identified. Other: No ascites or focal fluid collections identified. No signs of pneumoperitoneum. Musculoskeletal: No acute or significant osseous findings. Chronic, bilateral L5 pars defects identified without significant anterolisthesis. IMPRESSION: 1. No acute findings within the abdomen or pelvis. 2. Marked diffuse hepatic steatosis. 3. Small hiatal hernia. 4. Prominent bilateral ovaries with multiple peripheral follicles identified measuring up to 1 cm. These appear unchanged from the previous exam. Correlate for any clinical signs or symptoms of polycystic ovarian syndrome. 5. 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 Electronically Signed   By: Waddell Calk M.D.   On: 10/04/2023 16:33   CT Angio Chest PE W and/or Wo Contrast Result Date: 10/04/2023 CLINICAL DATA:  Tachycardia, lightheadedness, concern for pulmonary embolism. EXAM: CT ANGIOGRAPHY CHEST WITH CONTRAST TECHNIQUE: Multidetector CT imaging of the chest was performed using the standard protocol during bolus administration of intravenous contrast. Multiplanar CT image reconstructions and MIPs were obtained to evaluate the vascular anatomy. RADIATION DOSE REDUCTION: This exam was performed according to the departmental dose-optimization program which includes automated exposure control, adjustment of the mA and/or kV according to patient size and/or use of iterative reconstruction technique. CONTRAST:  75mL OMNIPAQUE  IOHEXOL  350 MG/ML SOLN COMPARISON:  Same day chest radiograph. FINDINGS: Cardiovascular: Satisfactory opacification of the pulmonary arteries to the segmental level. No evidence of pulmonary embolism. Normal heart size. No pericardial effusion. Mediastinum/Nodes: No enlarged mediastinal, hilar, or axillary lymph nodes. There is a small  hiatal hernia. Thyroid  gland, trachea, and esophagus demonstrate no significant findings. Lungs/Pleura: Lungs are clear. No pleural effusion or pneumothorax. Upper Abdomen: The liver is diffusely hypoattenuating, consistent with hepatic steatosis. Musculoskeletal: No chest wall abnormality. No acute or significant osseous findings. Review of the MIP images confirms the above findings. IMPRESSION: 1. No evidence of pulmonary embolism or other acute intrathoracic process. 2. Hepatic steatosis. Electronically Signed   By: Norman Hopper M.D.   On: 10/04/2023 16:16   DG Chest 2 View Result Date: 10/04/2023 CLINICAL DATA:  Tachycardia, lightheadedness EXAM: CHEST - 2 VIEW COMPARISON:  Chest radiograph dated 08/28/2023. FINDINGS:  The heart size and mediastinal contours are within normal limits. Both lungs are clear. The visualized skeletal structures are unremarkable. IMPRESSION: No active cardiopulmonary disease. Electronically Signed   By: Norman Hopper M.D.   On: 10/04/2023 15:02    Procedures .Critical Care  Performed by: Edie Rosebud LABOR, PA-C Authorized by: Edie Rosebud LABOR, PA-C   Critical care provider statement:    Critical care time (minutes):  35   Critical care was necessary to treat or prevent imminent or life-threatening deterioration of the following conditions:  Dehydration and endocrine crisis   Critical care was time spent personally by me on the following activities:  Development of treatment plan with patient or surrogate, discussions with consultants, evaluation of patient's response to treatment, examination of patient, ordering and review of laboratory studies, ordering and review of radiographic studies, ordering and performing treatments and interventions, pulse oximetry, re-evaluation of patient's condition and review of old charts     Medications Ordered in ED Medications  sodium chloride  0.9 % bolus 1,000 mL (0 mLs Intravenous Stopped 10/04/23 1828)  potassium chloride  10  mEq in 100 mL IVPB (0 mEq Intravenous Stopped 10/04/23 1829)  ondansetron  (ZOFRAN ) injection 4 mg (4 mg Intravenous Given 10/04/23 1533)  sodium chloride  0.9 % bolus 1,000 mL (0 mLs Intravenous Stopped 10/04/23 1711)  iohexol  (OMNIPAQUE ) 350 MG/ML injection 75 mL (75 mLs Intravenous Contrast Given 10/04/23 1537)  iohexol  (OMNIPAQUE ) 300 MG/ML solution 80 mL (80 mLs Intravenous Contrast Given 10/04/23 1612)  magnesium  sulfate IVPB 2 g 50 mL (0 g Intravenous Stopped 10/04/23 1844)  ondansetron  (ZOFRAN ) injection 4 mg (4 mg Intravenous Given 10/04/23 1956)  morphine  (PF) 4 MG/ML injection 4 mg (4 mg Intravenous Given 10/04/23 1956)    ED Course/ Medical Decision Making/ A&P   28 year old here for palpitations and nausea and vomiting.  Symptoms started.  Multiple episodes of NBNB emesis.  No chest pain, shortness of breath, abdominal pain.  No urinary symptoms.  She is unsure if she could be pregnant.  She does states she has had some increased anxiety.  She has a history of hypokalemia, AKI in setting of recurrent vomiting.  Does admit to occasional EtOH use and marijuana use.  Labs and imaging personally viewed interpreted COVID, flu, RSV negative CBC without leukocytosis UA negative for infection Pregnancy test negative UDS negative D-dimer 1.15 Hepatic function panel elevated LFTs with normal bilirubin, alk phos Magnesium  1.2 Metabolic panel shows hypokalemia, elevated anion gap  Patient reassessed.  Denies any Tylenol , aspirin  use.  Does states she drinks alcohol however cannot quantify.  Will plan on imaging and reassess  CT a chest without acute abnormality CT abdomen pelvis with marked diffuse hepatic steatosis  Patient reassessed.  Nausea returned, having some generalized abdominal cramping prior to emesis.  Will give additional medication  Patient reassessed gotten multiple rounds of IV potassium.  Her pain is controlled, she is tolerating p.o. intake.  Could possibly have an early developing  pancreatitis given her elevated lipase.  She denies any blood in her emesis or stool.  Discussed clear liquid diet over the next few days  Follow-up outpatient, return for any worsening symptoms.  Her palpitations likely due to electrolyte abnormalities which have been replaced here in the ED.                                Medical Decision Making Amount and/or Complexity of Data Reviewed External Data Reviewed:  labs, radiology, ECG and notes. Labs: ordered. Decision-making details documented in ED Course. Radiology: ordered and independent interpretation performed. Decision-making details documented in ED Course. ECG/medicine tests: ordered and independent interpretation performed. Decision-making details documented in ED Course.  Risk OTC drugs. Prescription drug management. Parenteral controlled substances. Decision regarding hospitalization. Diagnosis or treatment significantly limited by social determinants of health.           Final Clinical Impression(s) / ED Diagnoses Final diagnoses:  Nausea and vomiting, unspecified vomiting type  Anxiety  Elevated lipase  Hypokalemia  Hypomagnesemia  Elevated LFTs  Hepatic steatosis    Rx / DC Orders ED Discharge Orders          Ordered    ondansetron  (ZOFRAN -ODT) 4 MG disintegrating tablet  Every 8 hours PRN        10/04/23 2040    potassium chloride  SA (KLOR-CON  M) 20 MEQ tablet  Daily        10/04/23 2045              Meg Niemeier A, PA-C 10/04/23 2048    Dreama Longs, MD 10/05/23 2146

## 2023-10-04 NOTE — ED Triage Notes (Signed)
 Pt c/o tachycardia starting last night and feeling light-headed. Denies CP

## 2023-10-05 DIAGNOSIS — R8290 Unspecified abnormal findings in urine: Secondary | ICD-10-CM | POA: Diagnosis present

## 2023-10-05 DIAGNOSIS — E860 Dehydration: Secondary | ICD-10-CM | POA: Diagnosis present

## 2023-10-05 DIAGNOSIS — F419 Anxiety disorder, unspecified: Secondary | ICD-10-CM | POA: Diagnosis present

## 2023-10-05 DIAGNOSIS — K828 Other specified diseases of gallbladder: Secondary | ICD-10-CM | POA: Diagnosis present

## 2023-10-05 DIAGNOSIS — K851 Biliary acute pancreatitis without necrosis or infection: Principal | ICD-10-CM | POA: Diagnosis present

## 2023-10-05 DIAGNOSIS — N179 Acute kidney failure, unspecified: Secondary | ICD-10-CM | POA: Diagnosis present

## 2023-10-05 DIAGNOSIS — Z87440 Personal history of urinary (tract) infections: Secondary | ICD-10-CM

## 2023-10-05 DIAGNOSIS — I10 Essential (primary) hypertension: Secondary | ICD-10-CM | POA: Diagnosis present

## 2023-10-05 DIAGNOSIS — F32A Depression, unspecified: Secondary | ICD-10-CM | POA: Diagnosis present

## 2023-10-05 DIAGNOSIS — F1290 Cannabis use, unspecified, uncomplicated: Secondary | ICD-10-CM | POA: Diagnosis present

## 2023-10-05 DIAGNOSIS — Z8616 Personal history of COVID-19: Secondary | ICD-10-CM

## 2023-10-05 DIAGNOSIS — E876 Hypokalemia: Secondary | ICD-10-CM | POA: Diagnosis present

## 2023-10-05 DIAGNOSIS — K8020 Calculus of gallbladder without cholecystitis without obstruction: Secondary | ICD-10-CM | POA: Diagnosis present

## 2023-10-05 DIAGNOSIS — Z79899 Other long term (current) drug therapy: Secondary | ICD-10-CM

## 2023-10-05 DIAGNOSIS — K660 Peritoneal adhesions (postprocedural) (postinfection): Secondary | ICD-10-CM | POA: Diagnosis present

## 2023-10-06 ENCOUNTER — Emergency Department (HOSPITAL_BASED_OUTPATIENT_CLINIC_OR_DEPARTMENT_OTHER): Payer: Medicaid Other

## 2023-10-06 ENCOUNTER — Inpatient Hospital Stay (HOSPITAL_BASED_OUTPATIENT_CLINIC_OR_DEPARTMENT_OTHER)
Admission: EM | Admit: 2023-10-06 | Discharge: 2023-10-09 | DRG: 421 | Disposition: A | Discharge status: Home / Self Care | Payer: Medicaid Other | Attending: Internal Medicine | Admitting: Internal Medicine

## 2023-10-06 ENCOUNTER — Other Ambulatory Visit: Payer: Self-pay

## 2023-10-06 ENCOUNTER — Encounter (HOSPITAL_BASED_OUTPATIENT_CLINIC_OR_DEPARTMENT_OTHER): Payer: Self-pay

## 2023-10-06 DIAGNOSIS — N179 Acute kidney failure, unspecified: Secondary | ICD-10-CM | POA: Diagnosis present

## 2023-10-06 DIAGNOSIS — Z79899 Other long term (current) drug therapy: Secondary | ICD-10-CM | POA: Diagnosis not present

## 2023-10-06 DIAGNOSIS — R8290 Unspecified abnormal findings in urine: Secondary | ICD-10-CM | POA: Diagnosis present

## 2023-10-06 DIAGNOSIS — Z87440 Personal history of urinary (tract) infections: Secondary | ICD-10-CM | POA: Diagnosis not present

## 2023-10-06 DIAGNOSIS — K859 Acute pancreatitis without necrosis or infection, unspecified: Principal | ICD-10-CM | POA: Diagnosis present

## 2023-10-06 DIAGNOSIS — K828 Other specified diseases of gallbladder: Secondary | ICD-10-CM | POA: Diagnosis present

## 2023-10-06 DIAGNOSIS — R1110 Vomiting, unspecified: Secondary | ICD-10-CM | POA: Diagnosis present

## 2023-10-06 DIAGNOSIS — E876 Hypokalemia: Secondary | ICD-10-CM | POA: Insufficient documentation

## 2023-10-06 DIAGNOSIS — E860 Dehydration: Secondary | ICD-10-CM | POA: Diagnosis present

## 2023-10-06 DIAGNOSIS — K8020 Calculus of gallbladder without cholecystitis without obstruction: Secondary | ICD-10-CM | POA: Diagnosis present

## 2023-10-06 DIAGNOSIS — K851 Biliary acute pancreatitis without necrosis or infection: Secondary | ICD-10-CM | POA: Diagnosis present

## 2023-10-06 DIAGNOSIS — F419 Anxiety disorder, unspecified: Secondary | ICD-10-CM | POA: Diagnosis present

## 2023-10-06 DIAGNOSIS — F32A Depression, unspecified: Secondary | ICD-10-CM | POA: Diagnosis present

## 2023-10-06 DIAGNOSIS — F1290 Cannabis use, unspecified, uncomplicated: Secondary | ICD-10-CM | POA: Diagnosis present

## 2023-10-06 DIAGNOSIS — I10 Essential (primary) hypertension: Secondary | ICD-10-CM | POA: Diagnosis present

## 2023-10-06 DIAGNOSIS — Z8616 Personal history of COVID-19: Secondary | ICD-10-CM | POA: Diagnosis not present

## 2023-10-06 DIAGNOSIS — K660 Peritoneal adhesions (postprocedural) (postinfection): Secondary | ICD-10-CM | POA: Diagnosis present

## 2023-10-06 LAB — CBC WITH DIFFERENTIAL/PLATELET
Abs Immature Granulocytes: 0.03 10*3/uL (ref 0.00–0.07)
Basophils Absolute: 0 10*3/uL (ref 0.0–0.1)
Basophils Relative: 0 %
Eosinophils Absolute: 0 10*3/uL (ref 0.0–0.5)
Eosinophils Relative: 0 %
HCT: 42.6 % (ref 36.0–46.0)
Hemoglobin: 13.9 g/dL (ref 12.0–15.0)
Immature Granulocytes: 0 %
Lymphocytes Relative: 12 %
Lymphs Abs: 1.1 10*3/uL (ref 0.7–4.0)
MCH: 31.5 pg (ref 26.0–34.0)
MCHC: 32.6 g/dL (ref 30.0–36.0)
MCV: 96.6 fL (ref 80.0–100.0)
Monocytes Absolute: 0.8 10*3/uL (ref 0.1–1.0)
Monocytes Relative: 9 %
Neutro Abs: 7.2 10*3/uL (ref 1.7–7.7)
Neutrophils Relative %: 79 %
Platelets: 170 10*3/uL (ref 150–400)
RBC: 4.41 MIL/uL (ref 3.87–5.11)
RDW: 14.2 % (ref 11.5–15.5)
WBC: 9.1 10*3/uL (ref 4.0–10.5)
nRBC: 0.3 % — ABNORMAL HIGH (ref 0.0–0.2)

## 2023-10-06 LAB — URINALYSIS, ROUTINE W REFLEX MICROSCOPIC
Glucose, UA: NEGATIVE mg/dL
Ketones, ur: >80 mg/dL — AB
Leukocytes,Ua: NEGATIVE
Nitrite: NEGATIVE
Protein, ur: >300 mg/dL — AB
Specific Gravity, Urine: 1.041 — ABNORMAL HIGH (ref 1.005–1.030)
pH: 6 (ref 5.0–8.0)

## 2023-10-06 LAB — URINE CULTURE: Culture: >=100000 — AB

## 2023-10-06 LAB — MAGNESIUM: Magnesium: 1.5 mg/dL — ABNORMAL LOW (ref 1.7–2.4)

## 2023-10-06 LAB — COMPREHENSIVE METABOLIC PANEL
ALT: 65 U/L — ABNORMAL HIGH (ref 0–44)
AST: 102 U/L — ABNORMAL HIGH (ref 15–41)
Albumin: 5.2 g/dL — ABNORMAL HIGH (ref 3.5–5.0)
Alkaline Phosphatase: 90 U/L (ref 38–126)
Anion gap: 25 — ABNORMAL HIGH (ref 5–15)
BUN: 9 mg/dL (ref 6–20)
CO2: 22 mmol/L (ref 22–32)
Calcium: 10.2 mg/dL (ref 8.9–10.3)
Chloride: 92 mmol/L — ABNORMAL LOW (ref 98–111)
Creatinine, Ser: 1.38 mg/dL — ABNORMAL HIGH (ref 0.44–1.00)
GFR, Estimated: 54 mL/min — ABNORMAL LOW (ref >60)
Glucose, Bld: 137 mg/dL — ABNORMAL HIGH (ref 70–99)
Potassium: 3.1 mmol/L — ABNORMAL LOW (ref 3.5–5.1)
Sodium: 139 mmol/L (ref 135–145)
Total Bilirubin: 0.7 mg/dL (ref 0.0–1.2)
Total Protein: 9.5 g/dL — ABNORMAL HIGH (ref 6.5–8.1)

## 2023-10-06 LAB — TRIGLYCERIDES: Triglycerides: 80 mg/dL (ref <150)

## 2023-10-06 LAB — LIPASE, BLOOD: Lipase: 1827 U/L — ABNORMAL HIGH (ref 11–51)

## 2023-10-06 LAB — C-REACTIVE PROTEIN: CRP: 0.7 mg/dL (ref <1.0)

## 2023-10-06 LAB — PREGNANCY, URINE: Preg Test, Ur: NEGATIVE

## 2023-10-06 MED ORDER — ACETAMINOPHEN 650 MG RE SUPP: 650.0000 mg | Freq: Four times a day (QID) | RECTAL | Status: DC | PRN

## 2023-10-06 MED ORDER — POTASSIUM CHLORIDE CRYS ER 20 MEQ PO TBCR
40.0000 meq | EXTENDED_RELEASE_TABLET | Freq: Once | ORAL | Status: AC
  Administered 2023-10-06: 40 meq via ORAL
  Filled 2023-10-06: qty 2

## 2023-10-06 MED ORDER — HYDROMORPHONE HCL 1 MG/ML IJ SOLN
0.5000 mg | INTRAMUSCULAR | Status: DC | PRN
  Administered 2023-10-06 – 2023-10-09 (×4): 0.5 mg via INTRAVENOUS
  Filled 2023-10-06 (×4): qty 0.5

## 2023-10-06 MED ORDER — ENOXAPARIN SODIUM 40 MG/0.4ML IJ SOSY
40.0000 mg | PREFILLED_SYRINGE | INTRAMUSCULAR | Status: DC
  Filled 2023-10-06 (×2): qty 0.4

## 2023-10-06 MED ORDER — HYDROMORPHONE HCL 1 MG/ML IJ SOLN
1.0000 mg | Freq: Once | INTRAMUSCULAR | Status: AC
  Administered 2023-10-06: 1 mg via INTRAVENOUS
  Filled 2023-10-06: qty 1

## 2023-10-06 MED ORDER — ACETAMINOPHEN 325 MG PO TABS: 650.0000 mg | ORAL_TABLET | Freq: Four times a day (QID) | ORAL | Status: DC | PRN

## 2023-10-06 MED ORDER — FAMOTIDINE IN NACL 20-0.9 MG/50ML-% IV SOLN
20.0000 mg | Freq: Once | INTRAVENOUS | Status: AC
  Administered 2023-10-06: 20 mg via INTRAVENOUS
  Filled 2023-10-06: qty 50

## 2023-10-06 MED ORDER — HYDROCODONE-ACETAMINOPHEN 5-325 MG PO TABS
1.0000 | ORAL_TABLET | Freq: Four times a day (QID) | ORAL | Status: DC | PRN
  Administered 2023-10-06 – 2023-10-08 (×7): 1 via ORAL
  Filled 2023-10-06 (×8): qty 1

## 2023-10-06 MED ORDER — LACTATED RINGERS IV SOLN: INTRAVENOUS | Status: AC

## 2023-10-06 MED ORDER — ONDANSETRON HCL 4 MG/2ML IJ SOLN
4.0000 mg | Freq: Once | INTRAMUSCULAR | Status: AC
  Administered 2023-10-06: 4 mg via INTRAVENOUS
  Filled 2023-10-06: qty 2

## 2023-10-06 MED ORDER — POLYETHYLENE GLYCOL 3350 17 G PO PACK
17.0000 g | PACK | Freq: Every day | ORAL | Status: DC | PRN
  Administered 2023-10-06: 17 g via ORAL
  Filled 2023-10-06: qty 1

## 2023-10-06 MED ORDER — SODIUM CHLORIDE 0.9% FLUSH
3.0000 mL | Freq: Two times a day (BID) | INTRAVENOUS | Status: DC
  Administered 2023-10-06 – 2023-10-08 (×6): 3 mL via INTRAVENOUS

## 2023-10-06 MED ORDER — HALOPERIDOL LACTATE 5 MG/ML IJ SOLN
2.0000 mg | Freq: Once | INTRAMUSCULAR | Status: AC
  Administered 2023-10-06: 2 mg via INTRAVENOUS
  Filled 2023-10-06: qty 1

## 2023-10-06 MED ORDER — SODIUM CHLORIDE 0.9 % IV BOLUS
1000.0000 mL | Freq: Once | INTRAVENOUS | Status: AC
  Administered 2023-10-06: 1000 mL via INTRAVENOUS

## 2023-10-06 NOTE — ED Triage Notes (Signed)
 POV from home/ seen on Sunday for same/ treated for hypokalemia/ pt is not feeling any better/ pt states she cannot keep anything down/ pt actively vomiting in triage/ pt is ambulatory/ A&Ox4

## 2023-10-06 NOTE — ED Notes (Signed)
 Clear liquids diet only per EDP

## 2023-10-06 NOTE — TOC CM/SW Note (Signed)
 Transition of Care [facility]) - Inpatient Brief Assessment   Patient Details  Name: Melissa Guzman MRN: [id number] Date of Birth: [birth year]  [facility] of [facility]) CM/SW Contact:    Inocente GORMAN Kindle, LCSW Phone Number: 10/06/2023, 3:51 PM   Clinical Narrative: Patient admitted from home with pancreatitis. Please place consult if TOC needs arise.    Transition of Care Asessment: Insurance and Status: Insurance coverage has been reviewed Patient has primary care physician: Yes Home environment has been reviewed: From homw Prior level of function:: Independent Prior/Current Home Services: No current home services Social Drivers of Health Review: SDOH reviewed no interventions necessary Readmission risk has been reviewed: Yes Transition of care needs: no transition of care needs at this time

## 2023-10-06 NOTE — H&P (Addendum)
 History and Physical   Melissa Guzman FMW:[id number] DOB: January 10, 1996 DOA: 10/06/2023  PCP: Lola Donnice HERO, MD   Patient coming from: Home  Chief Complaint: Nausea, vomiting, abdominal pain  HPI: Melissa Guzman is a 28 y.o. female with medical history significant of hypertension, anxiety, depression presenting with ongoing nausea vomiting abdominal pain.  Patient initially presented 2 days ago to the ED with similar symptoms.  CT at that time looked okay patient was noted to be hypokalemic but received repletion.  Patient was discharged home with return precautions.  Since that time she has had persistent/worsening nausea vomiting and abdominal pain.  She denies fevers, chills, chest pain, shortness of breath, constipation, diarrhea.  ED Course: Vital signs in the ED notable for blood pressure in the 130s to 150s systolic, heart rate in the 80s to 130s.  Lab workup included CMP with potassium 3.1, chloride 93, creatinine elevated 1.38 from baseline 0.8, glucose 137, protein 9.5, albumin 5.2, AST 102, ALT 65.  CBC within normal limits.  Triglycerides pending.  Lipase elevated to 1827 up from 187 2 days ago.  Urinalysis with hemoglobin, bilirubin, ketones, protein, rare bacteria.  Ultrasound of the abdomen pelvis showed gallbladder sludge and suspected small stones, no gallbladder wall thickening or pericholecystic fluid, 3 mm polyp, echogenic liver consistent with fatty infiltration.  Patient received Dilaudid , Haldol , Pepcid , Zofran , 2 L IV fluids and started on a rate of IV fluids.  Review of Systems: As per HPI otherwise all other systems reviewed and are negative.  Past Medical History:  Diagnosis Date   Acute blood loss anemia 03/01/2022   Anemia    COVID-19 affecting pregnancy in third trimester 02/04/2022   +01/28/2022   Endometritis 03/05/2022   Hypertension    Postpartum septic endometritis 03/09/2022   UTI (urinary tract infection) 05/26/2023    Past Surgical History:  Procedure  Laterality Date   CESAREAN SECTION N/A 03/01/2022   Procedure: CESAREAN SECTION;  Surgeon: Lorence Ozell CROME, MD;  Location: [facility] LD ORS;  Service: Obstetrics;  Laterality: N/A;   IR RADIOLOGIST EVAL & MGMT  03/28/2022   IR RADIOLOGIST EVAL & MGMT  04/10/2022    Social History  reports that she has never smoked. She has never used smokeless tobacco. She reports current alcohol use. She reports that she does not use drugs.  No Known Allergies  History reviewed. No pertinent family history.  Prior to Admission medications   Medication Sig Start Date End Date Taking? Authorizing Provider  pantoprazole  (PROTONIX ) 40 MG tablet Take 40 mg by mouth daily. 08/29/23  Yes [provider]  acetaminophen  (TYLENOL ) 500 MG tablet Take 2 tablets (1,000 mg total) by mouth every 8 (eight) hours as needed (pain). 03/05/22   Clem Tawni HERO, MD  amLODipine  (NORVASC ) 5 MG tablet Take 1 tablet (5 mg total) by mouth daily. 05/29/23 08/19/23  Raenelle Coria, MD  hydrOXYzine  (ATARAX ) 10 MG tablet Take 1 tablet (10 mg total) by mouth 3 (three) times daily as needed. 04/24/23   Lola Donnice HERO, MD  ondansetron  (ZOFRAN -ODT) 4 MG disintegrating tablet Take 1 tablet (4 mg total) by mouth every 8 (eight) hours as needed. 10/04/23   Henderly, Britni A, PA-C  potassium chloride  SA (KLOR-CON  M) 20 MEQ tablet Take 1 tablet (20 mEq total) by mouth daily for 4 days. 10/04/23 10/08/23  Henderly, Britni A, PA-C  sertraline  (ZOLOFT ) 50 MG tablet Take 1 tablet (50 mg total) by mouth daily. Patient not taking: Reported on 08/19/2023 04/24/23   Lola,  Donnice HERO, MD    Physical Exam: Vitals:   10/06/23 1038 10/06/23 1111 10/06/23 1200 10/06/23 1350  BP:  (!) 135/103 (!) 140/110 (!) 135/109  Pulse:  (!) 105 98 99  Resp:  18 20 18   Temp: 98.1 F (36.7 C)   98 F (36.7 C)  TempSrc: Oral   Oral  SpO2:  97% 99%   Weight:        Physical Exam Constitutional:      General: She is not in acute distress.    Appearance:  Normal appearance.  HENT:     Head: Normocephalic and atraumatic.     Mouth/Throat:     Mouth: Mucous membranes are moist.     Pharynx: Oropharynx is clear.  Eyes:     Extraocular Movements: Extraocular movements intact.     Pupils: Pupils are equal, round, and reactive to light.  Cardiovascular:     Rate and Rhythm: Normal rate and regular rhythm.     Pulses: Normal pulses.     Heart sounds: Normal heart sounds.  Pulmonary:     Effort: Pulmonary effort is normal. No respiratory distress.     Breath sounds: Normal breath sounds.  Abdominal:     General: Bowel sounds are normal. There is no distension.     Palpations: Abdomen is soft.     Tenderness: There is abdominal tenderness.  Musculoskeletal:        General: No swelling or deformity.  Skin:    General: Skin is warm and dry.  Neurological:     General: No focal deficit present.     Mental Status: Mental status is at baseline.    Labs on Admission: I have personally reviewed following labs and imaging studies  CBC: Recent Labs  Lab 10/04/23 1338 10/06/23 0041  WBC 4.3 9.1  NEUTROABS  --  7.2  HGB 10.9* 13.9  HCT 33.0* 42.6  MCV 94.6 96.6  PLT 149* 170    Basic Metabolic Panel: Recent Labs  Lab 10/04/23 1338 10/04/23 1504 10/04/23 1849 10/06/23 0041  NA 140  --  137 139  K 2.8*  --  3.2* 3.1*  CL 96*  --  96* 92*  CO2 21*  --  23 22  GLUCOSE 72  --  78 137*  BUN 9  --  6 9  CREATININE 0.84  --  0.73 1.38*  CALCIUM 9.2  --  9.1 10.2  MG  --  1.2*  --   --     GFR: Estimated Creatinine Clearance: 54.1 mL/min (A) (by C-G formula based on SCr of 1.38 mg/dL (H)).  Liver Function Tests: Recent Labs  Lab 10/04/23 1504 10/06/23 0041  AST 185* 102*  ALT 70* 65*  ALKPHOS 88 90  BILITOT 0.4 0.7  PROT 8.3* 9.5*  ALBUMIN 4.7 5.2*    Urine analysis:    Component Value Date/Time   COLORURINE YELLOW 10/06/2023 0041   APPEARANCEUR HAZY (A) 10/06/2023 0041   LABSPEC 1.041 (H) 10/06/2023 0041    PHURINE 6.0 10/06/2023 0041   GLUCOSEU NEGATIVE 10/06/2023 0041   HGBUR MODERATE (A) 10/06/2023 0041   BILIRUBINUR MODERATE (A) 10/06/2023 0041   KETONESUR >80 (A) 10/06/2023 0041   PROTEINUR >300 (A) 10/06/2023 0041   UROBILINOGEN 0.2 08/19/2023 1540   NITRITE NEGATIVE 10/06/2023 0041   LEUKOCYTESUR NEGATIVE 10/06/2023 0041    Radiological Exams on Admission: US  Abdomen Limited RUQ (LIVER/GB) Result Date: 10/06/2023 CLINICAL DATA:  Pancreatitis. EXAM: ULTRASOUND ABDOMEN LIMITED RIGHT UPPER  QUADRANT COMPARISON:  Abdomen and pelvis CT 10/04/2023 FINDINGS: Gallbladder: There is some minimal dependent sludge in the lumen of the gallbladder with probable associated tiny sandlike stones. These findings were not evident on CT imaging 2 days ago. 3 mm cholesterol polyp identified towards the fundus. No gallbladder wall thickening or pericholecystic fluid Common bile duct: Diameter: 4 mm Liver: No focal lesion identified. Echogenic liver parenchyma is nonspecific but suggests fatty deposition. Portal vein is patent on color Doppler imaging with normal direction of blood flow towards the liver. Other: Trace perihepatic free fluid evident. IMPRESSION: 1. Minimal dependent sludge in the lumen of the gallbladder with probable associated tiny sandlike stones. These findings were not evident on CT imaging 2 days ago. No gallbladder wall thickening or pericholecystic fluid. 2. 3 mm cholesterol polyp towards the fundus. 3. Echogenic liver parenchyma is nonspecific but suggests fatty deposition. 4. Trace perihepatic free fluid. Electronically Signed   By: Camellia Candle M.D.   On: 10/06/2023 07:09   CT ABDOMEN PELVIS W CONTRAST Result Date: 10/04/2023 CLINICAL DATA:  Epigastric pain, chest pain and abdominal pain. EXAM: CT ABDOMEN AND PELVIS WITH CONTRAST TECHNIQUE: Multidetector CT imaging of the abdomen and pelvis was performed using the standard protocol following bolus administration of intravenous contrast.  RADIATION DOSE REDUCTION: This exam was performed according to the departmental dose-optimization program which includes automated exposure control, adjustment of the mA and/or kV according to patient size and/or use of iterative reconstruction technique. CONTRAST:  80mL OMNIPAQUE  IOHEXOL  300 MG/ML  SOLN COMPARISON:  05/26/2023 FINDINGS: Lower chest: No acute abnormality. Hepatobiliary: Marked diffuse hepatic steatosis. More focal fatty deposition is noted about the falciform ligament. No suspicious liver abnormality identified. Pancreas: No main duct dilatation or mass. No significant changes of pancreatitis identified at this time. Specifically there is no peripancreatic fat stranding, free fluid, necrosis or pseudocyst. Spleen: Normal in size without focal abnormality. Adrenals/Urinary Tract: Normal adrenal glands. No hydronephrosis or mass. Urinary bladder appears normal. Stomach/Bowel: Small hiatal hernia. The appendix is visualized and appears normal. No pathologic dilatation of the large or small bowel loops to suggest an obstruction. No bowel wall thickening or inflammatory fat stranding. Vascular/Lymphatic: No significant vascular findings are present. No enlarged abdominal or pelvic lymph nodes. Reproductive: Uterus appears normal. Prominent bilateral ovaries with multiple peripheral follicles identified measuring up to 1 cm. These appear unchanged from the previous exam. No adnexal mass identified. Other: No ascites or focal fluid collections identified. No signs of pneumoperitoneum. Musculoskeletal: No acute or significant osseous findings. Chronic, bilateral L5 pars defects identified without significant anterolisthesis. IMPRESSION: 1. No acute findings within the abdomen or pelvis. 2. Marked diffuse hepatic steatosis. 3. Small hiatal hernia. 4. Prominent bilateral ovaries with multiple peripheral follicles identified measuring up to 1 cm. These appear unchanged from the previous exam. Correlate for any  clinical signs or symptoms of polycystic ovarian syndrome. 5. 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 Electronically Signed   By: Waddell Calk M.D.   On: 10/04/2023 16:33   CT Angio Chest PE W and/or Wo Contrast Result Date: 10/04/2023 CLINICAL DATA:  Tachycardia, lightheadedness, concern for pulmonary embolism. EXAM: CT ANGIOGRAPHY CHEST WITH CONTRAST TECHNIQUE: Multidetector CT imaging of the chest was performed using the standard protocol during bolus administration of intravenous contrast. Multiplanar CT image reconstructions and MIPs were obtained to evaluate the vascular anatomy. RADIATION DOSE REDUCTION: This exam was performed according to the departmental dose-optimization program which includes automated exposure control, adjustment of the mA and/or kV  according to patient size and/or use of iterative reconstruction technique. CONTRAST:  75mL OMNIPAQUE  IOHEXOL  350 MG/ML SOLN COMPARISON:  Same day chest radiograph. FINDINGS: Cardiovascular: Satisfactory opacification of the pulmonary arteries to the segmental level. No evidence of pulmonary embolism. Normal heart size. No pericardial effusion. Mediastinum/Nodes: No enlarged mediastinal, hilar, or axillary lymph nodes. There is a small hiatal hernia. Thyroid  gland, trachea, and esophagus demonstrate no significant findings. Lungs/Pleura: Lungs are clear. No pleural effusion or pneumothorax. Upper Abdomen: The liver is diffusely hypoattenuating, consistent with hepatic steatosis. Musculoskeletal: No chest wall abnormality. No acute or significant osseous findings. Review of the MIP images confirms the above findings. IMPRESSION: 1. No evidence of pulmonary embolism or other acute intrathoracic process. 2. Hepatic steatosis. Electronically Signed   By: Norman Hopper M.D.   On: 10/04/2023 16:16    EKG: Not performed in emergency department  Assessment/Plan Principal Problem:   Acute pancreatitis Active Problems:    Essential hypertension   Hypokalemia   Acute pancreatitis > Presenting with ongoing nausea vomiting abdominal pain. > Lipase elevated to 1827 up from 187 2 days ago. > Received pain medication, antiemetic, IV fluids in the ED. > Received some liquid diet in the ED as well. > No repeat CT done as this was done 2 days prior and is clinically suspicious for pancreatitis.  No cholecystitis on ultrasound of the abdomen pelvis but sludge and small stones were noted. - Was accepted to progressive unit, will monitor there overnight - Continue with IV fluids - Clear liquid diet for now if tolerated - As needed Dilaudid  for severe pain - Supportive care - Follow-up triglycerides  AKI Hypokalemia > Creatinine elevated 1.3 from baseline 0.8 in the setting of nausea vomiting. > Potassium noted to be persistently low at 3.1. - Continue with IV fluids overnight - 40 mEq p.o. potassium - Check magnesium  - Trend renal function and electrolytes  Abnormal urine culture > Urine culture from 2 days ago that came back showing E. coli.  100,000. > No dysuria and questionable frequency. > Urinalysis in ED showed rare bacteria and negative nitrite and negative leukocytes. - Will recheck culture  Hypertension - Continue home amlodipine   DVT prophylaxis: Lovenox  Code Status:   Full Family Communication:  None on admission  Disposition Plan:   Patient is from:  Home  Anticipated DC to:  Home  Anticipated DC date:  1 to 3 days  Anticipated DC barriers: None  Consults called:  None Admission status:  Accepted as inpatient, progressive  Severity of Illness: The appropriate patient status for this patient is INPATIENT. Inpatient status is judged to be reasonable and necessary in order to provide the required intensity of service to ensure the patient's safety. The patient's presenting symptoms, physical exam findings, and initial radiographic and laboratory data in the context of their chronic  comorbidities is felt to place them at high risk for further clinical deterioration. Furthermore, it is not anticipated that the patient will be medically stable for discharge from the hospital within 2 midnights of admission.   * I certify that at the point of admission it is my clinical judgment that the patient will require inpatient hospital care spanning beyond 2 midnights from the point of admission due to high intensity of service, high risk for further deterioration and high frequency of surveillance required.DEWAINE Marsa KATHEE Seena MD [facility]  How to contact the [facility] Attending or Consulting provider 7A - 7P or covering provider during after hours 7P -7A,  for this patient?   Check the care team in [facility] and look for a) attending/consulting [facility] provider listed and b) the [facility] team listed Log into [email or web address] and use [facility] universal password to access. If you do not have the password, please contact the hospital operator. Locate the [facility] provider you are looking for under [facility] and page to a number that you can be directly reached. If you still have difficulty reaching the provider, please page the [facility] (Director on Call) for the Hospitalists listed on amion for assistance.  10/06/2023, 3:09 PM

## 2023-10-06 NOTE — ED Notes (Signed)
 Pt was told not to eat or drink anything at this time

## 2023-10-06 NOTE — Progress Notes (Signed)
 Plan of Care Note for accepted transfer   Patient: Melissa Guzman MRN: [id number]   DOA: 10/06/2023  Facility requesting transfer: [facility].  ER. Requesting Provider: Dr. Theadore. Reason for transfer: Concerning for acute pancreatitis. Facility course: 28 year old female who had come to the ER 2 days ago with nausea vomiting at that time CT scan abdomen pelvis done did not show any acute CT angiogram of the chest was negative for PE.  Presents back with intractable nausea vomiting and also abdominal discomfort.  Labs show persistently elevated LFTs and this time lipase also has markedly elevated.  Patient also in acute renal failure.  Patient symptoms are concerning for acute pancreatitis.  Cause not clear.  Ultrasound of the right upper quadrant is pending triglycerides are pending.  Per ER physician patient did drink alcohol previously has not had any alcohol in the last 3 months.  Pregnancy screen is pending.  Patient placed on fluids and pain relief medication admitted for acute pancreatitis.  Plan of care: The patient is accepted for admission to Progressive unit, at [facility].  Author: Redia LOISE Cleaver, MD 10/06/2023  Check [email or web address] for on-call coverage.  Nursing staff, Please call [facility] Admits & Consults System-Wide number on Amion as soon as patient's arrival, so appropriate admitting provider can evaluate the pt.

## 2023-10-06 NOTE — ED Notes (Signed)
 Pt given chicken noodle soup broth only and some ginger ale per EDP approval. Per pt tolerance

## 2023-10-06 NOTE — ED Notes (Signed)
Melissa Guzman with cl called for transport

## 2023-10-06 NOTE — ED Provider Notes (Signed)
 [facility] Emergency Department Provider Note MRN:  [id number]  Arrival date & time: 10/06/23     Chief Complaint   Emesis   History of Present Illness   Melissa Guzman is a 28 y.o. year-old female with a history of hypertension presenting to the ED with chief complaint of emesis.  Persistent nausea vomiting, having some generalized abdominal discomfort.  Was recently evaluated in the emergency department a few days ago and discharged home.  Not feeling any better.  Review of Systems  A thorough review of systems was obtained and all systems are negative except as noted in the HPI and PMH.   Patient's Health History    Past Medical History:  Diagnosis Date   Acute blood loss anemia 03/01/2022   Anemia    COVID-19 affecting pregnancy in third trimester 02/04/2022   +01/28/2022   Endometritis 03/05/2022   Hypertension    Postpartum septic endometritis 03/09/2022    Past Surgical History:  Procedure Laterality Date   CESAREAN SECTION N/A 03/01/2022   Procedure: CESAREAN SECTION;  Surgeon: Lorence Ozell CROME, MD;  Location: [facility] LD ORS;  Service: Obstetrics;  Laterality: N/A;   IR RADIOLOGIST EVAL & MGMT  03/28/2022   IR RADIOLOGIST EVAL & MGMT  04/10/2022    History reviewed. No pertinent family history.  Social History   Socioeconomic History   Marital status: Single    Spouse name: Not on file   Number of children: Not on file   Years of education: Not on file   Highest education level: Not on file  Occupational History   Not on file  Tobacco Use   Smoking status: Never   Smokeless tobacco: Never  Vaping Use   Vaping status: Never Used  Substance and Sexual Activity   Alcohol use: Yes    Comment: occ   Drug use: No   Sexual activity: Yes    Birth control/protection: None  Other Topics Concern   Not on file  Social History Narrative   Not on file   Social Drivers of Health   Financial Resource Strain: Not on file  Food Insecurity: No Food  Insecurity (05/27/2023)   Hunger Vital Sign    Worried About Running Out of Food in the Last Year: Never true    Ran Out of Food in the Last Year: Never true  Transportation Needs: No Transportation Needs (05/27/2023)   PRAPARE - Transportation    Lack of Transportation (Medical): No    Lack of Transportation (Non-Medical): No  Physical Activity: Not on file  Stress: Not on file  Social Connections: Unknown (02/11/2022)   [facility]   Social Network    Social Network: Not on file  Intimate Partner Violence: Not At Risk (05/27/2023)   Humiliation, Afraid, Rape, and Kick questionnaire    Fear of Current or Ex-Partner: No    Emotionally Abused: No    Physically Abused: No    Sexually Abused: No     Physical Exam   Vitals:   10/06/23 0300 10/06/23 0343  BP: (!) 158/115 (!) 158/115  Pulse: 85 85  Resp:  20  Temp:  97.8 F (36.6 C)  SpO2: 100% 100%    CONSTITUTIONAL: Well-appearing, NAD NEURO/PSYCH:  Alert and oriented x 3, no focal deficits EYES:  eyes equal and reactive ENT/NECK:  no LAD, no JVD CARDIO: Tachycardic rate, well-perfused, normal S1 and S2 PULM:  CTAB no wheezing or rhonchi GI/GU:  non-distended, soft, mild diffuse  tenderness MSK/SPINE:  No gross deformities, no edema SKIN:  no rash, atraumatic   *Additional and/or pertinent findings included in MDM below  Diagnostic and Interventional Summary    EKG Interpretation Date/Time:    Ventricular Rate:    PR Interval:    QRS Duration:    QT Interval:    QTC Calculation:   R Axis:      Text Interpretation:         Labs Reviewed  CBC WITH DIFFERENTIAL/PLATELET - Abnormal; Notable for the following components:      Result Value   nRBC 0.3 (*)    All other components within normal limits  COMPREHENSIVE METABOLIC PANEL - Abnormal; Notable for the following components:   Potassium 3.1 (*)    Chloride 92 (*)    Glucose, Bld 137 (*)    Creatinine, Ser 1.38 (*)    Total  Protein 9.5 (*)    Albumin 5.2 (*)    AST 102 (*)    ALT 65 (*)    GFR, Estimated 54 (*)    Anion gap 25 (*)    All other components within normal limits  URINALYSIS, ROUTINE W REFLEX MICROSCOPIC - Abnormal; Notable for the following components:   APPearance HAZY (*)    Specific Gravity, Urine 1.041 (*)    Hgb urine dipstick MODERATE (*)    Bilirubin Urine MODERATE (*)    Ketones, ur >80 (*)    Protein, ur >300 (*)    Bacteria, UA RARE (*)    Non Squamous Epithelial 0-5 (*)    All other components within normal limits  LIPASE, BLOOD - Abnormal; Notable for the following components:   Lipase 1,827 (*)    All other components within normal limits    No orders to display    Medications  ondansetron  (ZOFRAN ) injection 4 mg (4 mg Intravenous Given 10/06/23 0120)  sodium chloride  0.9 % bolus 1,000 mL (0 mLs Intravenous Stopped 10/06/23 0427)  sodium chloride  0.9 % bolus 1,000 mL (0 mLs Intravenous Stopped 10/06/23 0427)  famotidine  (PEPCID ) IVPB 20 mg premix (0 mg Intravenous Stopped 10/06/23 0343)  haloperidol  lactate (HALDOL ) injection 2 mg (2 mg Intravenous Given 10/06/23 0250)  HYDROmorphone  (DILAUDID ) injection 1 mg (1 mg Intravenous Given 10/06/23 0344)     Procedures  /  Critical Care Procedures  ED Course and Medical Decision Making  Initial Impression and Ddx Nausea vomiting abdominal discomfort for the past few days, had thorough evaluation a few days ago with CT imaging that was unremarkable.  Denies that this is ever happened before, possibly a viral illness.  Providing symptomatic management and will reassess.  Tachycardic and so possibly dehydrated.  Past medical/surgical history that increases complexity of ED encounter: None  Interpretation of Diagnostics I personally reviewed the laboratory assessment and my interpretation is as follows: Acute kidney injury, significant increase in lipase level    Patient Reassessment and Ultimate Disposition/Management     Patient  with continued epigastric pain, will admit to medicine for pancreatitis, dehydration, acute kidney injury.  Patient management required discussion with the following services or consulting groups:  Hospitalist Service  Complexity of Problems Addressed Acute illness or injury that poses threat of life of bodily function  Additional Data Reviewed and Analyzed Further history obtained from: Prior labs/imaging results  Additional Factors Impacting ED Encounter Risk Use of parenteral controlled substances and Consideration of hospitalization  Ozell HERO. Theadore, MD [facility] Emergency Medicine [facility] [email or web address]  Final Clinical  Impressions(s) / ED Diagnoses     ICD-10-CM   1. Acute pancreatitis, unspecified complication status, unspecified pancreatitis type  K85.90     2. AKI (acute kidney injury) (HCC)  N17.9       ED Discharge Orders     None        Discharge Instructions Discussed with and Provided to Patient:   Discharge Instructions   None      Theadore Ozell HERO, MD 10/06/23 [phone]

## 2023-10-07 ENCOUNTER — Encounter (HOSPITAL_COMMUNITY): Payer: Self-pay | Admitting: Internal Medicine

## 2023-10-07 ENCOUNTER — Ambulatory Visit (HOSPITAL_COMMUNITY): Payer: Medicaid Other | Admitting: Clinical

## 2023-10-07 DIAGNOSIS — I10 Essential (primary) hypertension: Secondary | ICD-10-CM | POA: Diagnosis not present

## 2023-10-07 DIAGNOSIS — K859 Acute pancreatitis without necrosis or infection, unspecified: Secondary | ICD-10-CM | POA: Diagnosis not present

## 2023-10-07 DIAGNOSIS — N179 Acute kidney failure, unspecified: Secondary | ICD-10-CM | POA: Diagnosis not present

## 2023-10-07 DIAGNOSIS — E876 Hypokalemia: Secondary | ICD-10-CM | POA: Diagnosis not present

## 2023-10-07 LAB — COMPREHENSIVE METABOLIC PANEL
ALT: 34 U/L (ref 0–44)
AST: 40 U/L (ref 15–41)
Albumin: 3.3 g/dL — ABNORMAL LOW (ref 3.5–5.0)
Alkaline Phosphatase: 60 U/L (ref 38–126)
Anion gap: 11 (ref 5–15)
BUN: <5 mg/dL — ABNORMAL LOW (ref 6–20)
CO2: 24 mmol/L (ref 22–32)
Calcium: 8.6 mg/dL — ABNORMAL LOW (ref 8.9–10.3)
Chloride: 101 mmol/L (ref 98–111)
Creatinine, Ser: 0.71 mg/dL (ref 0.44–1.00)
GFR, Estimated: >60 mL/min (ref >60)
Glucose, Bld: 99 mg/dL (ref 70–99)
Potassium: 3.1 mmol/L — ABNORMAL LOW (ref 3.5–5.1)
Sodium: 136 mmol/L (ref 135–145)
Total Bilirubin: 1 mg/dL (ref 0.0–1.2)
Total Protein: 6.6 g/dL (ref 6.5–8.1)

## 2023-10-07 LAB — URINE CULTURE: Culture: NO GROWTH

## 2023-10-07 LAB — CBC
HCT: 38.9 % (ref 36.0–46.0)
Hemoglobin: 12.8 g/dL (ref 12.0–15.0)
MCH: 31.7 pg (ref 26.0–34.0)
MCHC: 32.9 g/dL (ref 30.0–36.0)
MCV: 96.3 fL (ref 80.0–100.0)
Platelets: 112 10*3/uL — ABNORMAL LOW (ref 150–400)
RBC: 4.04 MIL/uL (ref 3.87–5.11)
RDW: 13.7 % (ref 11.5–15.5)
WBC: 8.2 10*3/uL (ref 4.0–10.5)
nRBC: 0 % (ref 0.0–0.2)

## 2023-10-07 MED ORDER — POTASSIUM CHLORIDE 20 MEQ PO PACK
40.0000 meq | PACK | ORAL | Status: AC
  Administered 2023-10-07 (×2): 40 meq via ORAL
  Filled 2023-10-07 (×2): qty 2

## 2023-10-07 MED ORDER — LACTATED RINGERS IV SOLN: INTRAVENOUS | Status: AC

## 2023-10-07 MED ORDER — AMLODIPINE BESYLATE 5 MG PO TABS
5.0000 mg | ORAL_TABLET | Freq: Every day | ORAL | Status: DC
  Administered 2023-10-07 – 2023-10-09 (×3): 5 mg via ORAL
  Filled 2023-10-07 (×3): qty 1

## 2023-10-07 NOTE — Progress Notes (Addendum)
 PROGRESS NOTE        PATIENT DETAILS Name: Melissa Guzman Age: 28 y.o. Sex: female Date of Birth: 1996/04/10 Admit Date: 10/06/2023 Admitting Physician Redia LOISE Cleaver, MD ERE:Zrxduju, Donnice HERO, MD  Brief Summary: Patient is a 28 y.o.  female with history of HTN-presenting with abdominal pain-found to have gallstone pancreatitis  Significant events: 1/7>> admit to [facility]  Significant studies: 1/5>> CT angio chest: No PE 1/5>> CT abdomen/pelvis: No acute findings 1/7>> RUQ ultrasound: Sludge/stones.  Significant microbiology data: None  Procedures: None  Consults: General surgery  Subjective: Feels better-some pain but markedly better than yesterday.  Objective: Vitals: Blood pressure (!) 142/119, pulse 89, temperature 98.6 F (37 C), temperature source Oral, resp. rate 18, height 5' 1 (1.549 m), weight 64 kg, last menstrual period 09/13/2023, SpO2 94%, not currently breastfeeding.   Exam: Gen Exam:Alert awake-not in any distress HEENT:atraumatic, normocephalic Chest: B/L clear to auscultation anteriorly CVS:S1S2 regular Abdomen: Mildly tender in the upper abdominal area-no peritoneal signs. Extremities:no edema Neurology: Non focal Skin: no rash  Pertinent Labs/Radiology:    Latest Ref Rng & Units 10/07/2023    4:52 AM 10/06/2023   12:41 AM 10/04/2023    1:38 PM  CBC  WBC 4.0 - 10.5 K/uL 8.2  9.1  4.3   Hemoglobin 12.0 - 15.0 g/dL 87.1  86.0  89.0   Hematocrit 36.0 - 46.0 % 38.9  42.6  33.0   Platelets 150 - 400 K/uL 112  170  149     Lab Results  Component Value Date   NA 136 10/07/2023   K 3.1 (L) 10/07/2023   CL 101 10/07/2023   CO2 24 10/07/2023      Assessment/Plan: Gallstone pancreatitis Clinically improved-abdominal exam benign Continues to have some intermittent pain but markedly better We will leave on clear liquids General Surgery evaluation for laparoscopic cholecystectomy  HTN BP creeping up Resume  amlodipine   Hypokalemia Replete/recheck  BMI: Estimated body mass index is 26.66 kg/m as calculated from the following:   Height as of this encounter: 5' 1 (1.549 m).   Weight as of this encounter: 64 kg.   Code status:   Code Status: Full Code   DVT Prophylaxis: enoxaparin  (LOVENOX ) injection 40 mg Start: 10/06/23 1800   Family Communication: None at bedside   Disposition Plan: Status is: Inpatient Remains inpatient appropriate because: Severity of illness   Planned Discharge Destination:Home   Diet: Diet Order             Diet clear liquid Room service appropriate? Yes; Fluid consistency: Thin  Diet effective now                     Antimicrobial agents: Anti-infectives (From admission, onward)    None        MEDICATIONS: Scheduled Meds:  enoxaparin  (LOVENOX ) injection  40 mg Subcutaneous Q24H   potassium chloride   40 mEq Oral Q4H   sodium chloride  flush  3 mL Intravenous Q12H   Continuous Infusions: PRN Meds:.acetaminophen  **OR** acetaminophen , HYDROcodone -acetaminophen , HYDROmorphone  (DILAUDID ) injection, polyethylene glycol   I have personally reviewed following labs and imaging studies  LABORATORY DATA: CBC: Recent Labs  Lab 10/04/23 1338 10/06/23 0041 10/07/23 0452  WBC 4.3 9.1 8.2  NEUTROABS  --  7.2  --   HGB 10.9* 13.9 12.8  HCT 33.0* 42.6 38.9  MCV  94.6 96.6 96.3  PLT 149* 170 112*    Basic Metabolic Panel: Recent Labs  Lab 10/04/23 1338 10/04/23 1504 10/04/23 1849 10/06/23 0041 10/06/23 1412 10/07/23 0452  NA 140  --  137 139  --  136  K 2.8*  --  3.2* 3.1*  --  3.1*  CL 96*  --  96* 92*  --  101  CO2 21*  --  23 22  --  24  GLUCOSE 72  --  78 137*  --  99  BUN 9  --  6 9  --  <5*  CREATININE 0.84  --  0.73 1.38*  --  0.71  CALCIUM 9.2  --  9.1 10.2  --  8.6*  MG  --  1.2*  --   --  1.5*  --     GFR: Estimated Creatinine Clearance: 92.1 mL/min (by C-G formula based on SCr of 0.71 mg/dL).  Liver Function  Tests: Recent Labs  Lab 10/04/23 1504 10/06/23 0041 10/07/23 0452  AST 185* 102* 40  ALT 70* 65* 34  ALKPHOS 88 90 60  BILITOT 0.4 0.7 1.0  PROT 8.3* 9.5* 6.6  ALBUMIN 4.7 5.2* 3.3*   Recent Labs  Lab 10/04/23 1504 10/06/23 0041  LIPASE 187* 1,827*   No results for input(s): AMMONIA in the last 168 hours.  Coagulation Profile: No results for input(s): INR, PROTIME in the last 168 hours.  Cardiac Enzymes: No results for input(s): CKTOTAL, CKMB, CKMBINDEX, TROPONINI in the last 168 hours.  BNP (last 3 results) No results for input(s): PROBNP in the last 8760 hours.  Lipid Profile: Recent Labs    10/06/23 0041  TRIG 80    Thyroid  Function Tests: No results for input(s): TSH, T4TOTAL, FREET4, T3FREE, THYROIDAB in the last 72 hours.  Anemia Panel: No results for input(s): VITAMINB12, FOLATE, FERRITIN, TIBC, IRON , RETICCTPCT in the last 72 hours.  Urine analysis:    Component Value Date/Time   COLORURINE YELLOW 10/06/2023 0041   APPEARANCEUR HAZY (A) 10/06/2023 0041   LABSPEC 1.041 (H) 10/06/2023 0041   PHURINE 6.0 10/06/2023 0041   GLUCOSEU NEGATIVE 10/06/2023 0041   HGBUR MODERATE (A) 10/06/2023 0041   BILIRUBINUR MODERATE (A) 10/06/2023 0041   KETONESUR >80 (A) 10/06/2023 0041   PROTEINUR >300 (A) 10/06/2023 0041   UROBILINOGEN 0.2 08/19/2023 1540   NITRITE NEGATIVE 10/06/2023 0041   LEUKOCYTESUR NEGATIVE 10/06/2023 0041    Sepsis Labs: Lactic Acid, Venous    Component Value Date/Time   LATICACIDVEN 1.0 03/09/2022 1628    MICROBIOLOGY: Recent Results (from the past 240 hours)  Resp panel by RT-PCR (RSV, Flu A&B, Covid) Anterior Nasal Swab     Status: None   Collection Time: 10/04/23  1:19 PM   Specimen: Anterior Nasal Swab  Result Value Ref Range Status   SARS Coronavirus 2 by RT PCR NEGATIVE NEGATIVE Final    Comment: (NOTE) SARS-CoV-2 target nucleic acids are NOT DETECTED.  The SARS-CoV-2 RNA is  generally detectable in upper respiratory specimens during the acute phase of infection. The lowest concentration of SARS-CoV-2 viral copies this assay can detect is 138 copies/mL. A negative result does not preclude SARS-Cov-2 infection and should not be used as the sole basis for treatment or other patient management decisions. A negative result may occur with  improper specimen collection/handling, submission of specimen other than nasopharyngeal swab, presence of viral mutation(s) within the areas targeted by this assay, and inadequate number of viral copies(<138 copies/mL). A negative result  must be combined with clinical observations, patient history, and epidemiological information. The expected result is Negative.  Fact Sheet for Patients:  bloggercourse.com  Fact Sheet for Healthcare Providers:  seriousbroker.it  This test is no t yet approved or cleared by the [place]  FDA and  has been authorized for detection and/or diagnosis of SARS-CoV-2 by FDA under an Emergency Use Authorization (EUA). This EUA will remain  in effect (meaning this test can be used) for the duration of the COVID-19 declaration under Section 564(b)(1) of the Act, 21 U.S.C.section 360bbb-3(b)(1), unless the authorization is terminated  or revoked sooner.       Influenza A by PCR NEGATIVE NEGATIVE Final   Influenza B by PCR NEGATIVE NEGATIVE Final    Comment: (NOTE) The Xpert Xpress SARS-CoV-2/FLU/RSV plus assay is intended as an aid in the diagnosis of influenza from Nasopharyngeal swab specimens and should not be used as a sole basis for treatment. Nasal washings and aspirates are unacceptable for Xpert Xpress SARS-CoV-2/FLU/RSV testing.  Fact Sheet for Patients: bloggercourse.com  Fact Sheet for Healthcare Providers: seriousbroker.it  This test is not yet approved or cleared by the [place]  [place] FDA and has been authorized for detection and/or diagnosis of SARS-CoV-2 by FDA under an Emergency Use Authorization (EUA). This EUA will remain in effect (meaning this test can be used) for the duration of the COVID-19 declaration under Section 564(b)(1) of the Act, 21 U.S.C. section 360bbb-3(b)(1), unless the authorization is terminated or revoked.     Resp Syncytial Virus by PCR NEGATIVE NEGATIVE Final    Comment: (NOTE) Fact Sheet for Patients: bloggercourse.com  Fact Sheet for Healthcare Providers: seriousbroker.it  This test is not yet approved or cleared by the [place]  FDA and has been authorized for detection and/or diagnosis of SARS-CoV-2 by FDA under an Emergency Use Authorization (EUA). This EUA will remain in effect (meaning this test can be used) for the duration of the COVID-19 declaration under Section 564(b)(1) of the Act, 21 U.S.C. section 360bbb-3(b)(1), unless the authorization is terminated or revoked.  Performed at [facility], [address] [place], [place] [address]   Urine Culture     Status: Abnormal   Collection Time: 10/04/23  1:50 PM   Specimen: Urine, Random  Result Value Ref Range Status   Specimen Description   Final    URINE, RANDOM Performed at [facility], [address] [place], [place] [address]    Special Requests   Final    NONE Reflexed from [id number] Performed at [facility], [address] [place], [place] [address]    Culture >=100,000 COLONIES/mL ESCHERICHIA COLI (A)  Final   Report Status 10/06/2023 FINAL  Final   Organism ID, Bacteria ESCHERICHIA COLI (A)  Final      Susceptibility   Escherichia coli - MIC*    AMPICILLIN <=2 SENSITIVE Sensitive     CEFAZOLIN  <=4 SENSITIVE Sensitive     CEFEPIME <=0.12 SENSITIVE Sensitive     CEFTRIAXONE  <=0.25 SENSITIVE Sensitive     CIPROFLOXACIN <=0.25 SENSITIVE  Sensitive     GENTAMICIN <=1 SENSITIVE Sensitive     IMIPENEM <=0.25 SENSITIVE Sensitive     NITROFURANTOIN  <=16 SENSITIVE Sensitive     TRIMETH/SULFA <=20 SENSITIVE Sensitive     AMPICILLIN/SULBACTAM <=2 SENSITIVE Sensitive     PIP/TAZO <=4 SENSITIVE Sensitive ug/mL    * >=100,000 COLONIES/mL ESCHERICHIA COLI    RADIOLOGY STUDIES/RESULTS: US  Abdomen Limited RUQ (LIVER/GB) Result Date: 10/06/2023 CLINICAL DATA:  Pancreatitis. EXAM:  ULTRASOUND ABDOMEN LIMITED RIGHT UPPER QUADRANT COMPARISON:  Abdomen and pelvis CT 10/04/2023 FINDINGS: Gallbladder: There is some minimal dependent sludge in the lumen of the gallbladder with probable associated tiny sandlike stones. These findings were not evident on CT imaging 2 days ago. 3 mm cholesterol polyp identified towards the fundus. No gallbladder wall thickening or pericholecystic fluid Common bile duct: Diameter: 4 mm Liver: No focal lesion identified. Echogenic liver parenchyma is nonspecific but suggests fatty deposition. Portal vein is patent on color Doppler imaging with normal direction of blood flow towards the liver. Other: Trace perihepatic free fluid evident. IMPRESSION: 1. Minimal dependent sludge in the lumen of the gallbladder with probable associated tiny sandlike stones. These findings were not evident on CT imaging 2 days ago. No gallbladder wall thickening or pericholecystic fluid. 2. 3 mm cholesterol polyp towards the fundus. 3. Echogenic liver parenchyma is nonspecific but suggests fatty deposition. 4. Trace perihepatic free fluid. Electronically Signed   By: Camellia Candle M.D.   On: 10/06/2023 07:09     LOS: 1 day   Donalda Applebaum, MD  [facility]    To contact the attending provider between 7A-7P or the covering provider during after hours 7P-7A, please log into the web site [email or web address] and access using universal [place] password for that web site. If you do not have the password, please call the hospital  operator.  10/07/2023, 10:19 AM

## 2023-10-07 NOTE — Plan of Care (Signed)

## 2023-10-07 NOTE — Consult Note (Signed)
 Bradly Dade Oct 31, 1995  [id number].    Requesting MD: Raenelle, MD Chief Complaint/Reason for Consult: gallstone pancreatitis   HPI:  Melissa Guzman is a 28 y/o F with PMH c-section, HTN, and anxiety who presents with ongoing nausea, vomiting, and abdominal pain. She was seen in the ED 1/5 due to acute onset nausea, vomiting, and palpitations. She had a negative CT abdomen pelvis and CT PE study performed. She returned to the ED yesterday with worsening abdominal pain that radiates to her back and vomiting. RUQ ulstrasound shows gallstones and her lipase is <1,000. WBC is WNL. Her AST/ ALT were mildly elvated at 102, 65 and have normalized today.   The patient denies similar pain in the past. At baseline she lives at home with her child who is 2 and her significant other. She reports occasional THC use. She states she used to drink 2-3 shots of liquor daily but has not had alcohol for 3 months. She denies new medications recently. Denies use of blood thinners. She tells me that her c-section in 2023 was complicated by infected hematoma requiring IR drain.  ROS: Review of Systems  All other systems reviewed and are negative.   History reviewed. No pertinent family history.  Past Medical History:  Diagnosis Date   Acute blood loss anemia 03/01/2022   Anemia    COVID-19 affecting pregnancy in third trimester 02/04/2022   +01/28/2022   Endometritis 03/05/2022   Hypertension    Postpartum septic endometritis 03/09/2022   UTI (urinary tract infection) 05/26/2023    Past Surgical History:  Procedure Laterality Date   CESAREAN SECTION N/A 03/01/2022   Procedure: CESAREAN SECTION;  Surgeon: Lorence Ozell CROME, MD;  Location: [facility] LD [facility]S;  Service: Obstetrics;  Laterality: N/A;   IR RADIOLOGIST EVAL & MGMT  03/28/2022   IR RADIOLOGIST EVAL & MGMT  04/10/2022    Social History:  reports that she has never smoked. She has never used smokeless tobacco. She reports current alcohol use. She reports  that she does not use drugs.  Allergies: No Known Allergies  Medications Prior to Admission  Medication Sig Dispense Refill   acetaminophen  (TYLENOL ) 500 MG tablet Take 2 tablets (1,000 mg total) by mouth every 8 (eight) hours as needed (pain). 60 tablet 0   amLODipine  (NORVASC ) 5 MG tablet Take 1 tablet (5 mg total) by mouth daily. 30 tablet 0   ondansetron  (ZOFRAN -ODT) 4 MG disintegrating tablet Take 1 tablet (4 mg total) by mouth every 8 (eight) hours as needed. 20 tablet 0   pantoprazole  (PROTONIX ) 40 MG tablet Take 40 mg by mouth daily.     potassium chloride  SA (KLOR-CON  M) 20 MEQ tablet Take 1 tablet (20 mEq total) by mouth daily for 4 days. 4 tablet 0     Physical Exam: Blood pressure (!) 142/119, pulse 89, temperature 98.6 F (37 C), temperature source Oral, resp. rate 18, height 5' 1 (1.549 m), weight 64 kg, last menstrual period 09/13/2023, SpO2 94%, not currently breastfeeding. General: Pleasant female laying on hospital bed, appears stated age, NAD. HEENT: head -normocephalic, atraumatic; Eyes: PERRLA, no conjunctival injection, anicteric sclerae Neck- Trachea is midline CV- RRR, normal S1/S2, no M/R/G, no lower extremity edema  Pulm- breathing is non-labored ORA Abd- soft, non-distended, she is tender to light palpation across her upper abdomen, especially in her epigastric region, without guarding or peritonitis, no hernias or masses appreciated.  GU- deferred  MSK- UE/LE symmetrical, no cyanosis, clubbing, or edema. Neuro- CN II-XII  grossly in tact, no paresthesias. Psych- Alert and Oriented x3 with appropriate affect Skin: warm and dry, no rashes or lesions   Results for orders placed or performed during the hospital encounter of 10/06/23 (from the past 48 hours)  CBC with Differential     Status: Abnormal   Collection Time: 10/06/23 12:41 AM  Result Value Ref Range   WBC 9.1 4.0 - 10.5 K/uL   RBC 4.41 3.87 - 5.11 MIL/uL   Hemoglobin 13.9 12.0 - 15.0 g/dL   HCT  57.3 63.9 - 53.9 %   MCV 96.6 80.0 - 100.0 fL   MCH 31.5 26.0 - 34.0 pg   MCHC 32.6 30.0 - 36.0 g/dL   RDW 85.7 88.4 - 84.4 %   Platelets 170 150 - 400 K/uL   nRBC 0.3 (H) 0.0 - 0.2 %   Neutrophils Relative % 79 %   Neutro Abs 7.2 1.7 - 7.7 K/uL   Lymphocytes Relative 12 %   Lymphs Abs 1.1 0.7 - 4.0 K/uL   Monocytes Relative 9 %   Monocytes Absolute 0.8 0.1 - 1.0 K/uL   Eosinophils Relative 0 %   Eosinophils Absolute 0.0 0.0 - 0.5 K/uL   Basophils Relative 0 %   Basophils Absolute 0.0 0.0 - 0.1 K/uL   Immature Granulocytes 0 %   Abs Immature Granulocytes 0.03 0.00 - 0.07 K/uL    Comment: Performed at [facility], [address] [place], [place] [address]  Comprehensive metabolic panel     Status: Abnormal   Collection Time: 10/06/23 12:41 AM  Result Value Ref Range   Sodium 139 135 - 145 mmol/L   Potassium 3.1 (L) 3.5 - 5.1 mmol/L   Chloride 92 (L) 98 - 111 mmol/L   CO2 22 22 - 32 mmol/L   Glucose, Bld 137 (H) 70 - 99 mg/dL    Comment: Glucose reference range applies only to samples taken after fasting for at least 8 hours.   BUN 9 6 - 20 mg/dL   Creatinine, Ser 8.61 (H) 0.44 - 1.00 mg/dL   Calcium 89.7 8.9 - 89.6 mg/dL   Total Protein 9.5 (H) 6.5 - 8.1 g/dL   Albumin 5.2 (H) 3.5 - 5.0 g/dL   AST 897 (H) 15 - 41 U/L   ALT 65 (H) 0 - 44 U/L   Alkaline Phosphatase 90 38 - 126 U/L   Total Bilirubin 0.7 0.0 - 1.2 mg/dL   GFR, Estimated 54 (L) >60 mL/min    Comment: (NOTE) Calculated using the CKD-EPI Creatinine Equation (2021)    Anion gap 25 (H) 5 - 15    Comment: Electrolytes repeated to confirm. Performed at [facility], [address] [place], [place] [address]   Urinalysis, Routine w reflex microscopic -Urine, Clean Catch     Status: Abnormal   Collection Time: 10/06/23 12:41 AM  Result Value Ref Range   Color, Urine YELLOW YELLOW   APPearance HAZY (A) CLEAR   Specific Gravity, Urine 1.041 (H) 1.005 - 1.030   pH 6.0 5.0  - 8.0   Glucose, UA NEGATIVE NEGATIVE mg/dL   Hgb urine dipstick MODERATE (A) NEGATIVE   Bilirubin Urine MODERATE (A) NEGATIVE   Ketones, ur >80 (A) NEGATIVE mg/dL   Protein, ur >699 (A) NEGATIVE mg/dL   Nitrite NEGATIVE NEGATIVE   Leukocytes,Ua NEGATIVE NEGATIVE   RBC / HPF 6-10 0 - 5 RBC/hpf   WBC, UA 6-10 0 - 5 WBC/hpf   Bacteria, UA RARE (A) NONE  SEEN   Squamous Epithelial / HPF 6-10 0 - 5 /HPF   Mucus PRESENT    Hyaline Casts, UA PRESENT    Non Squamous Epithelial 0-5 (A) NONE SEEN    Comment: Performed at [facility], [address] [place], [place] [address]  Lipase, blood     Status: Abnormal   Collection Time: 10/06/23 12:41 AM  Result Value Ref Range   Lipase 1,827 (H) 11 - 51 U/L    Comment: RESULT CONFIRMED BY AUTOMATED DILUTION Performed at [facility], [address], [place], [place] [address]   Triglycerides     Status: None   Collection Time: 10/06/23 12:41 AM  Result Value Ref Range   Triglycerides 80 <150 mg/dL    Comment: Performed at [facility], [address] [place], [place] [address]  Pregnancy, urine     Status: None   Collection Time: 10/06/23 12:41 AM  Result Value Ref Range   Preg Test, Ur NEGATIVE NEGATIVE    Comment:        THE SENSITIVITY OF THIS METHODOLOGY IS >25 mIU/mL. Performed at [facility], [address], [place], [place] [address]   C-reactive protein     Status: None   Collection Time: 10/06/23  6:29 AM  Result Value Ref Range   CRP 0.7 <1.0 mg/dL    Comment: Performed at [facility], [address], [place], [place] [address]  Magnesium      Status: Abnormal   Collection Time: 10/06/23  2:12 PM  Result Value Ref Range   Magnesium  1.5 (L) 1.7 - 2.4 mg/dL    Comment: Performed at [facility], [address] [place], [place] [address]  Comprehensive metabolic panel     Status: Abnormal   Collection Time: 10/07/23  4:52 AM  Result Value Ref  Range   Sodium 136 135 - 145 mmol/L   Potassium 3.1 (L) 3.5 - 5.1 mmol/L   Chloride 101 98 - 111 mmol/L   CO2 24 22 - 32 mmol/L   Glucose, Bld 99 70 - 99 mg/dL    Comment: Glucose reference range applies only to samples taken after fasting for at least 8 hours.   BUN <5 (L) 6 - 20 mg/dL   Creatinine, Ser 9.28 0.44 - 1.00 mg/dL   Calcium 8.6 (L) 8.9 - 10.3 mg/dL   Total Protein 6.6 6.5 - 8.1 g/dL   Albumin 3.3 (L) 3.5 - 5.0 g/dL   AST 40 15 - 41 U/L   ALT 34 0 - 44 U/L   Alkaline Phosphatase 60 38 - 126 U/L   Total Bilirubin 1.0 0.0 - 1.2 mg/dL   GFR, Estimated >39 >39 mL/min    Comment: (NOTE) Calculated using the CKD-EPI Creatinine Equation (2021)    Anion gap 11 5 - 15    Comment: Performed at [facility], [address], [place], [place] [address]  CBC     Status: Abnormal   Collection Time: 10/07/23  4:52 AM  Result Value Ref Range   WBC 8.2 4.0 - 10.5 K/uL   RBC 4.04 3.87 - 5.11 MIL/uL   Hemoglobin 12.8 12.0 - 15.0 g/dL   HCT 61.0 63.9 - 53.9 %   MCV 96.3 80.0 - 100.0 fL   MCH 31.7 26.0 - 34.0 pg   MCHC 32.9 30.0 - 36.0 g/dL   RDW 86.2 88.4 - 84.4 %   Platelets 112 (L) 150 - 400 K/uL  Comment: REPEATED TO VERIFY   nRBC 0.0 0.0 - 0.2 %    Comment: Performed at [facility], [address], [place], [place] [address]   US  Abdomen Limited RUQ (LIVER/GB) Result Date: 10/06/2023 CLINICAL DATA:  Pancreatitis. EXAM: ULTRASOUND ABDOMEN LIMITED RIGHT UPPER QUADRANT COMPARISON:  Abdomen and pelvis CT 10/04/2023 FINDINGS: Gallbladder: There is some minimal dependent sludge in the lumen of the gallbladder with probable associated tiny sandlike stones. These findings were not evident on CT imaging 2 days ago. 3 mm cholesterol polyp identified towards the fundus. No gallbladder wall thickening or pericholecystic fluid Common bile duct: Diameter: 4 mm Liver: No focal lesion identified. Echogenic liver parenchyma is nonspecific but suggests fatty deposition. Portal vein is  patent on color Doppler imaging with normal direction of blood flow towards the liver. Other: Trace perihepatic free fluid evident. IMPRESSION: 1. Minimal dependent sludge in the lumen of the gallbladder with probable associated tiny sandlike stones. These findings were not evident on CT imaging 2 days ago. No gallbladder wall thickening or pericholecystic fluid. 2. 3 mm cholesterol polyp towards the fundus. 3. Echogenic liver parenchyma is nonspecific but suggests fatty deposition. 4. Trace perihepatic free fluid. Electronically Signed   By: Camellia Candle M.D.   On: 10/06/2023 07:09      Assessment/Plan Gallstone pancreatitis - afebrile, HR 90's, BP 142 systolic, WBC WNL - patients history, labs, imaging, and exam are all consistent with symptomatic cholelithiasis, gallstone pancreatitis. She is still pretty tender on exam today. Her pain is not exacerbated by the CLD she is on at present. Continue CLD, NPO after MN. Will examine her in the morning to see if she is appropriate for lap chole. I do believe she would benefit from lap chole this admission, once acute pancreatitis resolves, to prevent future symptoms or complications like severe pancreatitis, cholangitis, severe cholecystitis.  HTN Anxiety  Marijuana use    I reviewed nursing notes, hospitalist notes, last 24 h vitals and pain scores, last 48 h intake and output, last 24 h labs and trends, and last 24 h imaging results.  Almarie GORMAN Pringle, PA-C [facility] 10/07/2023, 10:14 AM Please see Amion for pager number during day hours 7:00am-4:30pm or 7:00am -11:30am on weekends

## 2023-10-08 DIAGNOSIS — N179 Acute kidney failure, unspecified: Secondary | ICD-10-CM | POA: Diagnosis not present

## 2023-10-08 DIAGNOSIS — I10 Essential (primary) hypertension: Secondary | ICD-10-CM | POA: Diagnosis not present

## 2023-10-08 DIAGNOSIS — K859 Acute pancreatitis without necrosis or infection, unspecified: Secondary | ICD-10-CM | POA: Diagnosis not present

## 2023-10-08 LAB — COMPREHENSIVE METABOLIC PANEL
ALT: 29 U/L (ref 0–44)
AST: 37 U/L (ref 15–41)
Albumin: 3.1 g/dL — ABNORMAL LOW (ref 3.5–5.0)
Alkaline Phosphatase: 59 U/L (ref 38–126)
Anion gap: 9 (ref 5–15)
BUN: <5 mg/dL — ABNORMAL LOW (ref 6–20)
CO2: 28 mmol/L (ref 22–32)
Calcium: 8.5 mg/dL — ABNORMAL LOW (ref 8.9–10.3)
Chloride: 100 mmol/L (ref 98–111)
Creatinine, Ser: 0.55 mg/dL (ref 0.44–1.00)
GFR, Estimated: >60 mL/min (ref >60)
Glucose, Bld: 95 mg/dL (ref 70–99)
Potassium: 3.1 mmol/L — ABNORMAL LOW (ref 3.5–5.1)
Sodium: 137 mmol/L (ref 135–145)
Total Bilirubin: 1 mg/dL (ref 0.0–1.2)
Total Protein: 6.2 g/dL — ABNORMAL LOW (ref 6.5–8.1)

## 2023-10-08 LAB — LIPASE, BLOOD: Lipase: 200 U/L — ABNORMAL HIGH (ref 11–51)

## 2023-10-08 MED ORDER — POTASSIUM CHLORIDE 10 MEQ/100ML IV SOLN
10.0000 meq | INTRAVENOUS | Status: AC
  Administered 2023-10-08 (×2): 10 meq via INTRAVENOUS
  Filled 2023-10-08: qty 100

## 2023-10-08 NOTE — Progress Notes (Addendum)
 PROGRESS NOTE        PATIENT DETAILS Name: Melissa Guzman Age: 28 y.o. Sex: female Date of Birth: March 02, 1996 Admit Date: 10/06/2023 Admitting Physician Redia LOISE Cleaver, MD ERE:Zrxduju, Donnice HERO, MD  Brief Summary: Patient is a 28 y.o.  female with history of HTN-presenting with abdominal pain-found to have gallstone pancreatitis  Significant events: 1/7>> admit to [facility]  Significant studies: 1/5>> CT angio chest: No PE 1/5>> CT abdomen/pelvis: No acute findings 1/7>> RUQ ultrasound: Sludge/stones.  Significant microbiology data: None  Procedures: None  Consults: General surgery  Subjective: Much better-minimal pain in her abdomen today.  Objective: Vitals: Blood pressure (!) 150/114, pulse 88, temperature 98 F (36.7 C), temperature source Oral, resp. rate 20, height 5' 1 (1.549 m), weight 64 kg, last menstrual period 09/13/2023, SpO2 100%, not currently breastfeeding.   Exam: Gen Exam:Alert awake-not in any distress HEENT:atraumatic, normocephalic Chest: B/L clear to auscultation anteriorly CVS:S1S2 regular Abdomen: Soft-mild tenderness in the epigastric area.  No peritoneal signs. Extremities:no edema Neurology: Non focal Skin: no rash  Pertinent Labs/Radiology:    Latest Ref Rng & Units 10/07/2023    4:52 AM 10/06/2023   12:41 AM 10/04/2023    1:38 PM  CBC  WBC 4.0 - 10.5 K/uL 8.2  9.1  4.3   Hemoglobin 12.0 - 15.0 g/dL 87.1  86.0  89.0   Hematocrit 36.0 - 46.0 % 38.9  42.6  33.0   Platelets 150 - 400 K/uL 112  170  149     Lab Results  Component Value Date   NA 137 10/08/2023   K 3.1 (L) 10/08/2023   CL 100 10/08/2023   CO2 28 10/08/2023      Assessment/Plan: Gallstone pancreatitis Clinically improved-minimal abdominal pain this morning Benign abdominal exam Continue clear liquids General Surgery planning cholecystectomy 1/10  HTN BP stable Continue amlodipine   Hypokalemia Replete/recheck  BMI: Estimated body  mass index is 26.66 kg/m as calculated from the following:   Height as of this encounter: 5' 1 (1.549 m).   Weight as of this encounter: 64 kg.   Code status:   Code Status: Full Code   DVT Prophylaxis: enoxaparin  (LOVENOX ) injection 40 mg Start: 10/06/23 1800   Family Communication: None at bedside   Disposition Plan: Status is: Inpatient Remains inpatient appropriate because: Severity of illness   Planned Discharge Destination:Home   Diet: Diet Order             Diet NPO time specified Except for: Sips with Meds  Diet effective midnight           Diet clear liquid Fluid consistency: Thin  Diet effective now                     Antimicrobial agents: Anti-infectives (From admission, onward)    None        MEDICATIONS: Scheduled Meds:  amLODipine   5 mg Oral Daily   enoxaparin  (LOVENOX ) injection  40 mg Subcutaneous Q24H   sodium chloride  flush  3 mL Intravenous Q12H   Continuous Infusions:  lactated ringers  100 mL/hr at 10/08/23 1228   PRN Meds:.acetaminophen  **OR** acetaminophen , HYDROcodone -acetaminophen , HYDROmorphone  (DILAUDID ) injection, polyethylene glycol   I have personally reviewed following labs and imaging studies  LABORATORY DATA: CBC: Recent Labs  Lab 10/04/23 1338 10/06/23 0041 10/07/23 0452  WBC 4.3 9.1 8.2  NEUTROABS  --  7.2  --   HGB 10.9* 13.9 12.8  HCT 33.0* 42.6 38.9  MCV 94.6 96.6 96.3  PLT 149* 170 112*    Basic Metabolic Panel: Recent Labs  Lab 10/04/23 1338 10/04/23 1504 10/04/23 1849 10/06/23 0041 10/06/23 1412 10/07/23 0452 10/08/23 0509  NA 140  --  137 139  --  136 137  K 2.8*  --  3.2* 3.1*  --  3.1* 3.1*  CL 96*  --  96* 92*  --  101 100  CO2 21*  --  23 22  --  24 28  GLUCOSE 72  --  78 137*  --  99 95  BUN 9  --  6 9  --  <5* <5*  CREATININE 0.84  --  0.73 1.38*  --  0.71 0.55  CALCIUM 9.2  --  9.1 10.2  --  8.6* 8.5*  MG  --  1.2*  --   --  1.5*  --   --     GFR: Estimated Creatinine  Clearance: 92.1 mL/min (by C-G formula based on SCr of 0.55 mg/dL).  Liver Function Tests: Recent Labs  Lab 10/04/23 1504 10/06/23 0041 10/07/23 0452 10/08/23 0509  AST 185* 102* 40 37  ALT 70* 65* 34 29  ALKPHOS 88 90 60 59  BILITOT 0.4 0.7 1.0 1.0  PROT 8.3* 9.5* 6.6 6.2*  ALBUMIN 4.7 5.2* 3.3* 3.1*   Recent Labs  Lab 10/04/23 1504 10/06/23 0041 10/08/23 0509  LIPASE 187* 1,827* 200*   No results for input(s): AMMONIA in the last 168 hours.  Coagulation Profile: No results for input(s): INR, PROTIME in the last 168 hours.  Cardiac Enzymes: No results for input(s): CKTOTAL, CKMB, CKMBINDEX, TROPONINI in the last 168 hours.  BNP (last 3 results) No results for input(s): PROBNP in the last 8760 hours.  Lipid Profile: Recent Labs    10/06/23 0041  TRIG 80    Thyroid  Function Tests: No results for input(s): TSH, T4TOTAL, FREET4, T3FREE, THYROIDAB in the last 72 hours.  Anemia Panel: No results for input(s): VITAMINB12, FOLATE, FERRITIN, TIBC, IRON , RETICCTPCT in the last 72 hours.  Urine analysis:    Component Value Date/Time   COLORURINE YELLOW 10/06/2023 0041   APPEARANCEUR HAZY (A) 10/06/2023 0041   LABSPEC 1.041 (H) 10/06/2023 0041   PHURINE 6.0 10/06/2023 0041   GLUCOSEU NEGATIVE 10/06/2023 0041   HGBUR MODERATE (A) 10/06/2023 0041   BILIRUBINUR MODERATE (A) 10/06/2023 0041   KETONESUR >80 (A) 10/06/2023 0041   PROTEINUR >300 (A) 10/06/2023 0041   UROBILINOGEN 0.2 08/19/2023 1540   NITRITE NEGATIVE 10/06/2023 0041   LEUKOCYTESUR NEGATIVE 10/06/2023 0041    Sepsis Labs: Lactic Acid, Venous    Component Value Date/Time   LATICACIDVEN 1.0 03/09/2022 1628    MICROBIOLOGY: Recent Results (from the past 240 hours)  Resp panel by RT-PCR (RSV, Flu A&B, Covid) Anterior Nasal Swab     Status: None   Collection Time: 10/04/23  1:19 PM   Specimen: Anterior Nasal Swab  Result Value Ref Range Status   SARS  Coronavirus 2 by RT PCR NEGATIVE NEGATIVE Final    Comment: (NOTE) SARS-CoV-2 target nucleic acids are NOT DETECTED.  The SARS-CoV-2 RNA is generally detectable in upper respiratory specimens during the acute phase of infection. The lowest concentration of SARS-CoV-2 viral copies this assay can detect is 138 copies/mL. A negative result does not preclude SARS-Cov-2 infection and should not be used as the sole basis for treatment or other patient  management decisions. A negative result may occur with  improper specimen collection/handling, submission of specimen other than nasopharyngeal swab, presence of viral mutation(s) within the areas targeted by this assay, and inadequate number of viral copies(<138 copies/mL). A negative result must be combined with clinical observations, patient history, and epidemiological information. The expected result is Negative.  Fact Sheet for Patients:  bloggercourse.com  Fact Sheet for Healthcare Providers:  seriousbroker.it  This test is no t yet approved or cleared by the [place]  FDA and  has been authorized for detection and/or diagnosis of SARS-CoV-2 by FDA under an Emergency Use Authorization (EUA). This EUA will remain  in effect (meaning this test can be used) for the duration of the COVID-19 declaration under Section 564(b)(1) of the Act, 21 U.S.C.section 360bbb-3(b)(1), unless the authorization is terminated  or revoked sooner.       Influenza A by PCR NEGATIVE NEGATIVE Final   Influenza B by PCR NEGATIVE NEGATIVE Final    Comment: (NOTE) The Xpert Xpress SARS-CoV-2/FLU/RSV plus assay is intended as an aid in the diagnosis of influenza from Nasopharyngeal swab specimens and should not be used as a sole basis for treatment. Nasal washings and aspirates are unacceptable for Xpert Xpress SARS-CoV-2/FLU/RSV testing.  Fact Sheet for  Patients: bloggercourse.com  Fact Sheet for Healthcare Providers: seriousbroker.it  This test is not yet approved or cleared by the [place]  FDA and has been authorized for detection and/or diagnosis of SARS-CoV-2 by FDA under an Emergency Use Authorization (EUA). This EUA will remain in effect (meaning this test can be used) for the duration of the COVID-19 declaration under Section 564(b)(1) of the Act, 21 U.S.C. section 360bbb-3(b)(1), unless the authorization is terminated or revoked.     Resp Syncytial Virus by PCR NEGATIVE NEGATIVE Final    Comment: (NOTE) Fact Sheet for Patients: bloggercourse.com  Fact Sheet for Healthcare Providers: seriousbroker.it  This test is not yet approved or cleared by the [place]  FDA and has been authorized for detection and/or diagnosis of SARS-CoV-2 by FDA under an Emergency Use Authorization (EUA). This EUA will remain in effect (meaning this test can be used) for the duration of the COVID-19 declaration under Section 564(b)(1) of the Act, 21 U.S.C. section 360bbb-3(b)(1), unless the authorization is terminated or revoked.  Performed at [facility], [address], [place], [place] [address]   Urine Culture     Status: Abnormal   Collection Time: 10/04/23  1:50 PM   Specimen: Urine, Random  Result Value Ref Range Status   Specimen Description   Final    URINE, RANDOM Performed at [facility], [address], [place], [place] [address]    Special Requests   Final    NONE Reflexed from [id number] Performed at [facility], [address] [place], [place] [address]    Culture >=100,000 COLONIES/mL ESCHERICHIA COLI (A)  Final   Report Status 10/06/2023 FINAL  Final   Organism ID, Bacteria ESCHERICHIA COLI (A)  Final      Susceptibility   Escherichia coli - MIC*     AMPICILLIN <=2 SENSITIVE Sensitive     CEFAZOLIN  <=4 SENSITIVE Sensitive     CEFEPIME <=0.12 SENSITIVE Sensitive     CEFTRIAXONE  <=0.25 SENSITIVE Sensitive     CIPROFLOXACIN <=0.25 SENSITIVE Sensitive     GENTAMICIN <=1 SENSITIVE Sensitive     IMIPENEM <=0.25 SENSITIVE Sensitive     NITROFURANTOIN  <=16 SENSITIVE Sensitive     TRIMETH/SULFA <=20 SENSITIVE Sensitive  AMPICILLIN/SULBACTAM <=2 SENSITIVE Sensitive     PIP/TAZO <=4 SENSITIVE Sensitive ug/mL    * >=100,000 COLONIES/mL ESCHERICHIA COLI  Urine Culture (for pregnant, neutropenic or urologic patients or patients with an indwelling urinary catheter)     Status: None   Collection Time: 10/06/23  3:07 PM   Specimen: Urine, Clean Catch  Result Value Ref Range Status   Specimen Description URINE, CLEAN CATCH  Final   Special Requests NONE  Final   Culture   Final    NO GROWTH Performed at [facility], [address], [place], [place] [address]    Report Status 10/07/2023 FINAL  Final    RADIOLOGY STUDIES/RESULTS: No results found.    LOS: 2 days   Donalda Applebaum, MD  [facility]    To contact the attending provider between 7A-7P or the covering provider during after hours 7P-7A, please log into the web site [email or web address] and access using universal [place] password for that web site. If you do not have the password, please call the hospital operator.  10/08/2023, 1:50 PM

## 2023-10-08 NOTE — Plan of Care (Signed)

## 2023-10-08 NOTE — Progress Notes (Signed)
 [facility] Progress Note     Subjective: CC:  Abdominal pain a little better. Denies nausea or vomiting.  Objective: Vital signs in last 24 hours: Temp:  [98 F (36.7 C)-98.4 F (36.9 C)] 98 F (36.7 C) (01/09 0807) Pulse Rate:  [88-95] 88 (01/09 0015) Resp:  [16-20] 20 (01/09 0807) BP: (145-154)/(95-114) 150/114 (01/09 0807) SpO2:  [95 %-100 %] 100 % (01/09 0807) Last BM Date : 10/03/23  Intake/Output from previous day: No intake/output data recorded. Intake/Output this shift: No intake/output data recorded.  PE: Gen:  Alert, NAD, pleasant Card:  Regular rate and rhythm Pulm:  Normal effort ORA Abd: Soft, minimally tender to deep epigastric palpation, other wise no tender, no guarding  Skin: warm and dry, no rashes  Psych: A&Ox3   Lab Results:  Recent Labs    10/06/23 0041 10/07/23 0452  WBC 9.1 8.2  HGB 13.9 12.8  HCT 42.6 38.9  PLT 170 112*   BMET Recent Labs    10/07/23 0452 10/08/23 0509  NA 136 137  K 3.1* 3.1*  CL 101 100  CO2 24 28  GLUCOSE 99 95  BUN <5* <5*  CREATININE 0.71 0.55  CALCIUM 8.6* 8.5*   PT/INR No results for input(s): LABPROT, INR in the last 72 hours. CMP     Component Value Date/Time   NA 137 10/08/2023 0509   K 3.1 (L) 10/08/2023 0509   CL 100 10/08/2023 0509   CO2 28 10/08/2023 0509   GLUCOSE 95 10/08/2023 0509   BUN <5 (L) 10/08/2023 0509   CREATININE 0.55 10/08/2023 0509   CALCIUM 8.5 (L) 10/08/2023 0509   PROT 6.2 (L) 10/08/2023 0509   ALBUMIN 3.1 (L) 10/08/2023 0509   AST 37 10/08/2023 0509   ALT 29 10/08/2023 0509   ALKPHOS 59 10/08/2023 0509   BILITOT 1.0 10/08/2023 0509   GFRNONAA >60 10/08/2023 0509   GFRAA >60 01/09/2020 1030   Lipase     Component Value Date/Time   LIPASE 200 (H) 10/08/2023 0509       Studies/Results: No results found.  Anti-infectives: Anti-infectives (From admission, onward)    None        Assessment/Plan  Gallstone pancreatitis AFVSS, LFTs  remain normal Lipase down-trending (200 today) Abdominal exam significantly improved today.  Plan lap chole with possible IOC tomorrow 1/10 by Dr. Polly    LOS: 2 days   I reviewed nursing notes, hospitalist notes, last 24 h vitals and pain scores, last 48 h intake and output, last 24 h labs and trends, and last 24 h imaging results.  This care required straight-forward level of medical decision making.   Almarie Pringle, PA-C [facility] Please see Amion for pager number during day hours 7:00am-4:30pm

## 2023-10-09 ENCOUNTER — Encounter (HOSPITAL_COMMUNITY)
Admission: EM | Disposition: A | Discharge status: Home / Self Care | Payer: Self-pay | Source: Home / Self Care | Attending: Internal Medicine

## 2023-10-09 ENCOUNTER — Inpatient Hospital Stay (HOSPITAL_COMMUNITY): Payer: Medicaid Other | Admitting: Certified Registered"

## 2023-10-09 ENCOUNTER — Encounter (HOSPITAL_COMMUNITY): Payer: Self-pay | Admitting: Internal Medicine

## 2023-10-09 ENCOUNTER — Inpatient Hospital Stay (HOSPITAL_COMMUNITY): Payer: Medicaid Other

## 2023-10-09 ENCOUNTER — Other Ambulatory Visit: Payer: Self-pay

## 2023-10-09 DIAGNOSIS — K851 Biliary acute pancreatitis without necrosis or infection: Secondary | ICD-10-CM

## 2023-10-09 DIAGNOSIS — I10 Essential (primary) hypertension: Secondary | ICD-10-CM | POA: Diagnosis not present

## 2023-10-09 DIAGNOSIS — K859 Acute pancreatitis without necrosis or infection, unspecified: Secondary | ICD-10-CM | POA: Diagnosis not present

## 2023-10-09 DIAGNOSIS — N179 Acute kidney failure, unspecified: Secondary | ICD-10-CM | POA: Diagnosis not present

## 2023-10-09 DIAGNOSIS — E876 Hypokalemia: Secondary | ICD-10-CM | POA: Diagnosis not present

## 2023-10-09 HISTORY — PX: DIAGNOSTIC LAPAROSCOPIC LIVER BIOPSY: SHX5797

## 2023-10-09 LAB — BASIC METABOLIC PANEL
Anion gap: 11 (ref 5–15)
BUN: <5 mg/dL — ABNORMAL LOW (ref 6–20)
CO2: 28 mmol/L (ref 22–32)
Calcium: 8.7 mg/dL — ABNORMAL LOW (ref 8.9–10.3)
Chloride: 99 mmol/L (ref 98–111)
Creatinine, Ser: 0.55 mg/dL (ref 0.44–1.00)
GFR, Estimated: >60 mL/min (ref >60)
Glucose, Bld: 110 mg/dL — ABNORMAL HIGH (ref 70–99)
Potassium: 3 mmol/L — ABNORMAL LOW (ref 3.5–5.1)
Sodium: 138 mmol/L (ref 135–145)

## 2023-10-09 LAB — MAGNESIUM: Magnesium: 1.2 mg/dL — ABNORMAL LOW (ref 1.7–2.4)

## 2023-10-09 SURGERY — BIOPSY, LIVER, LAPAROSCOPIC
Anesthesia: General

## 2023-10-09 MED ORDER — ONDANSETRON HCL 4 MG/2ML IJ SOLN
INTRAMUSCULAR | Status: DC | PRN
  Administered 2023-10-09: 4 mg via INTRAVENOUS

## 2023-10-09 MED ORDER — ORAL CARE MOUTH RINSE
15.0000 mL | Freq: Once | OROMUCOSAL | Status: AC
Start: 2023-10-09 — End: 2023-10-09

## 2023-10-09 MED ORDER — MAGNESIUM SULFATE 4 GM/100ML IV SOLN
4.0000 g | Freq: Once | INTRAVENOUS | Status: AC
  Administered 2023-10-09: 4 g via INTRAVENOUS
  Filled 2023-10-09: qty 100

## 2023-10-09 MED ORDER — BUPIVACAINE-EPINEPHRINE (PF) 0.25% -1:200000 IJ SOLN
INTRAMUSCULAR | Status: AC
  Filled 2023-10-09: qty 30

## 2023-10-09 MED ORDER — PROPOFOL 10 MG/ML IV BOLUS
INTRAVENOUS | Status: AC
  Filled 2023-10-09: qty 20

## 2023-10-09 MED ORDER — CEFAZOLIN SODIUM-DEXTROSE 2-3 GM-%(50ML) IV SOLR
INTRAVENOUS | Status: DC | PRN
  Administered 2023-10-09: 2 g via INTRAVENOUS

## 2023-10-09 MED ORDER — ONDANSETRON HCL 4 MG/2ML IJ SOLN
INTRAMUSCULAR | Status: AC
  Filled 2023-10-09: qty 2

## 2023-10-09 MED ORDER — ONDANSETRON HCL 4 MG/2ML IJ SOLN: 4.0000 mg | Freq: Once | INTRAMUSCULAR | Status: DC | PRN

## 2023-10-09 MED ORDER — ESMOLOL HCL 100 MG/10ML IV SOLN
INTRAVENOUS | Status: AC
  Filled 2023-10-09: qty 10

## 2023-10-09 MED ORDER — ESMOLOL HCL 100 MG/10ML IV SOLN
INTRAVENOUS | Status: DC | PRN
  Administered 2023-10-09: 20 mg via INTRAVENOUS
  Administered 2023-10-09: 10 mg via INTRAVENOUS

## 2023-10-09 MED ORDER — GLYCOPYRROLATE PF 0.2 MG/ML IJ SOSY
PREFILLED_SYRINGE | INTRAMUSCULAR | Status: AC
  Filled 2023-10-09: qty 1

## 2023-10-09 MED ORDER — CEFAZOLIN SODIUM 1 G IJ SOLR
INTRAMUSCULAR | Status: AC
  Filled 2023-10-09: qty 20

## 2023-10-09 MED ORDER — DEXMEDETOMIDINE HCL IN NACL 80 MCG/20ML IV SOLN
INTRAVENOUS | Status: AC
  Filled 2023-10-09: qty 20

## 2023-10-09 MED ORDER — 0.9 % SODIUM CHLORIDE (POUR BTL) OPTIME
TOPICAL | Status: DC | PRN
  Administered 2023-10-09: 1000 mL

## 2023-10-09 MED ORDER — CHLORHEXIDINE GLUCONATE 0.12 % MT SOLN: 15.0000 mL | Freq: Once | OROMUCOSAL | Status: AC

## 2023-10-09 MED ORDER — LIDOCAINE 2% (20 MG/ML) 5 ML SYRINGE
INTRAMUSCULAR | Status: AC
Start: 2023-10-09 — End: ?
  Filled 2023-10-09: qty 5

## 2023-10-09 MED ORDER — FENTANYL CITRATE (PF) 100 MCG/2ML IJ SOLN: 25.0000 ug | INTRAMUSCULAR | Status: DC | PRN

## 2023-10-09 MED ORDER — PHENYLEPHRINE 80 MCG/ML (10ML) SYRINGE FOR IV PUSH (FOR BLOOD PRESSURE SUPPORT)
PREFILLED_SYRINGE | INTRAVENOUS | Status: AC
Start: 2023-10-09 — End: ?
  Filled 2023-10-09: qty 10

## 2023-10-09 MED ORDER — ACETAMINOPHEN 10 MG/ML IV SOLN: 1000.0000 mg | Freq: Once | INTRAVENOUS | Status: DC | PRN

## 2023-10-09 MED ORDER — BUPIVACAINE-EPINEPHRINE 0.25% -1:200000 IJ SOLN
INTRAMUSCULAR | Status: DC | PRN
  Administered 2023-10-09: 16 mL

## 2023-10-09 MED ORDER — OXYCODONE HCL 5 MG/5ML PO SOLN: 5.0000 mg | Freq: Once | ORAL | Status: DC | PRN

## 2023-10-09 MED ORDER — DEXAMETHASONE SODIUM PHOSPHATE 10 MG/ML IJ SOLN
INTRAMUSCULAR | Status: AC
  Filled 2023-10-09: qty 1

## 2023-10-09 MED ORDER — SUGAMMADEX SODIUM 200 MG/2ML IV SOLN
INTRAVENOUS | Status: DC | PRN
  Administered 2023-10-09 (×4): 100 mg via INTRAVENOUS

## 2023-10-09 MED ORDER — PROPOFOL 10 MG/ML IV BOLUS
INTRAVENOUS | Status: DC | PRN
  Administered 2023-10-09: 20 mg via INTRAVENOUS
  Administered 2023-10-09 (×2): 10 mg via INTRAVENOUS
  Administered 2023-10-09: 120 mg via INTRAVENOUS

## 2023-10-09 MED ORDER — LIDOCAINE 2% (20 MG/ML) 5 ML SYRINGE
INTRAMUSCULAR | Status: DC | PRN
  Administered 2023-10-09: 100 mg via INTRAVENOUS

## 2023-10-09 MED ORDER — ROCURONIUM BROMIDE 10 MG/ML (PF) SYRINGE
PREFILLED_SYRINGE | INTRAVENOUS | Status: AC
Start: 2023-10-09 — End: ?
  Filled 2023-10-09: qty 10

## 2023-10-09 MED ORDER — ROCURONIUM BROMIDE 10 MG/ML (PF) SYRINGE
PREFILLED_SYRINGE | INTRAVENOUS | Status: DC | PRN
  Administered 2023-10-09: 60 mg via INTRAVENOUS

## 2023-10-09 MED ORDER — FENTANYL CITRATE (PF) 250 MCG/5ML IJ SOLN
INTRAMUSCULAR | Status: DC | PRN
  Administered 2023-10-09: 50 ug via INTRAVENOUS
  Administered 2023-10-09: 100 ug via INTRAVENOUS

## 2023-10-09 MED ORDER — CHLORHEXIDINE GLUCONATE 0.12 % MT SOLN
OROMUCOSAL | Status: AC
  Administered 2023-10-09: 15 mL via OROMUCOSAL
  Filled 2023-10-09: qty 15

## 2023-10-09 MED ORDER — OXYCODONE HCL 5 MG PO TABS: 5.0000 mg | ORAL_TABLET | Freq: Once | ORAL | Status: DC | PRN

## 2023-10-09 MED ORDER — POTASSIUM CHLORIDE CRYS ER 20 MEQ PO TBCR
40.0000 meq | EXTENDED_RELEASE_TABLET | Freq: Once | ORAL | Status: AC
Start: 2023-10-09 — End: 2023-10-09
  Administered 2023-10-09: 40 meq via ORAL
  Filled 2023-10-09: qty 2

## 2023-10-09 MED ORDER — DEXAMETHASONE SODIUM PHOSPHATE 10 MG/ML IJ SOLN
INTRAMUSCULAR | Status: DC | PRN
  Administered 2023-10-09: 8 mg via INTRAVENOUS

## 2023-10-09 MED ORDER — MIDAZOLAM HCL 2 MG/2ML IJ SOLN
INTRAMUSCULAR | Status: AC
  Filled 2023-10-09: qty 2

## 2023-10-09 MED ORDER — FENTANYL CITRATE (PF) 250 MCG/5ML IJ SOLN
INTRAMUSCULAR | Status: AC
  Filled 2023-10-09: qty 5

## 2023-10-09 MED ORDER — DEXMEDETOMIDINE HCL IN NACL 80 MCG/20ML IV SOLN
INTRAVENOUS | Status: DC | PRN
  Administered 2023-10-09 (×2): 4 ug via INTRAVENOUS
  Administered 2023-10-09: 8 ug via INTRAVENOUS

## 2023-10-09 MED ORDER — MIDAZOLAM HCL 2 MG/2ML IJ SOLN
INTRAMUSCULAR | Status: DC | PRN
  Administered 2023-10-09 (×2): 1 mg via INTRAVENOUS

## 2023-10-09 SURGICAL SUPPLY — 46 items
APPLIER CLIP ROT 10 11.4 M/L (STAPLE) ×1 IMPLANT
BAG COUNTER SPONGE SURGICOUNT (BAG) ×1 IMPLANT
CANISTER SUCT 3000ML PPV (MISCELLANEOUS) ×1 IMPLANT
CATH URETL OPEN END 6FR 70 (CATHETERS) IMPLANT
CHLORAPREP W/TINT 26 (MISCELLANEOUS) ×1 IMPLANT
CLIP APPLIE ROT 10 11.4 M/L (STAPLE) ×1 IMPLANT
COVER MAYO STAND STRL (DRAPES) ×1 IMPLANT
COVER SURGICAL LIGHT HANDLE (MISCELLANEOUS) ×1 IMPLANT
DERMABOND ADVANCED .7 DNX12 (GAUZE/BANDAGES/DRESSINGS) ×1 IMPLANT
DRAPE C-ARM 42X120 X-RAY (DRAPES) ×1 IMPLANT
ELECT REM PT RETURN 9FT ADLT (ELECTROSURGICAL) ×1 IMPLANT
ELECTRODE REM PT RTRN 9FT ADLT (ELECTROSURGICAL) ×1 IMPLANT
ENDOLOOP SUT PDS II 0 18 (SUTURE) IMPLANT
GLOVE BIO SURGEON STRL SZ7 (GLOVE) ×1 IMPLANT
GOWN STRL REUS W/ TWL LRG LVL3 (GOWN DISPOSABLE) ×2 IMPLANT
GOWN STRL REUS W/ TWL XL LVL3 (GOWN DISPOSABLE) ×1 IMPLANT
GRASPER SUT TROCAR 14GX15 (MISCELLANEOUS) ×1 IMPLANT
IRRIG SUCT STRYKERFLOW 2 WTIP (MISCELLANEOUS) ×1 IMPLANT
IRRIGATION SUCT STRKRFLW 2 WTP (MISCELLANEOUS) ×1 IMPLANT
KIT BASIN OR (CUSTOM PROCEDURE TRAY) ×1 IMPLANT
KIT IMAGING PINPOINTPAQ (MISCELLANEOUS) IMPLANT
KIT TURNOVER KIT B (KITS) ×1 IMPLANT
L-HOOK LAP DISP 36CM (ELECTROSURGICAL) ×1 IMPLANT
LHOOK LAP DISP 36CM (ELECTROSURGICAL) ×1 IMPLANT
NDL 22X1.5 STRL (OR ONLY) (MISCELLANEOUS) ×1 IMPLANT
NDL INSUFFLATION 14GA 120MM (NEEDLE) ×1 IMPLANT
NEEDLE 22X1.5 STRL (OR ONLY) (MISCELLANEOUS) ×1 IMPLANT
NEEDLE INSUFFLATION 14GA 120MM (NEEDLE) ×1 IMPLANT
NS IRRIG 1000ML POUR BTL (IV SOLUTION) ×1 IMPLANT
PAD ARMBOARD 7.5X6 YLW CONV (MISCELLANEOUS) ×1 IMPLANT
PENCIL BUTTON HOLSTER BLD 10FT (ELECTRODE) ×1 IMPLANT
POUCH RETRIEVAL ECOSAC 10 (ENDOMECHANICALS) ×1 IMPLANT
SCISSORS LAP 5X35 DISP (ENDOMECHANICALS) ×1 IMPLANT
SET CHOLANGIOGRAPH 5 50 .035 (SET/KITS/TRAYS/PACK) ×1 IMPLANT
SET TUBE SMOKE EVAC HIGH FLOW (TUBING) ×1 IMPLANT
SLEEVE Z-THREAD 5X100MM (TROCAR) ×2 IMPLANT
SPECIMEN JAR SMALL (MISCELLANEOUS) ×1 IMPLANT
STOPCOCK 4 WAY LG BORE MALE ST (IV SETS) IMPLANT
SUT MNCRL AB 4-0 PS2 18 (SUTURE) ×1 IMPLANT
TOWEL GREEN STERILE (TOWEL DISPOSABLE) ×1 IMPLANT
TOWEL GREEN STERILE FF (TOWEL DISPOSABLE) ×1 IMPLANT
TRAY LAPAROSCOPIC MC (CUSTOM PROCEDURE TRAY) ×1 IMPLANT
TROCAR Z THREAD OPTICAL 12X100 (TROCAR) ×1 IMPLANT
TROCAR Z-THREAD OPTICAL 5X100M (TROCAR) ×1 IMPLANT
WARMER LAPAROSCOPE (MISCELLANEOUS) ×1 IMPLANT
WATER STERILE IRR 1000ML POUR (IV SOLUTION) ×1 IMPLANT

## 2023-10-09 NOTE — Progress Notes (Signed)
 [facility] Progress Note  * Day of Surgery *  Subjective: CC:  Abdominal pain continues to improve.   Objective: Vital signs in last 24 hours: Temp:  [98 F (36.7 C)-98.3 F (36.8 C)] 98.3 F (36.8 C) (01/09 2101) Pulse Rate:  [102] 102 (01/09 2101) Resp:  [20] 20 (01/09 0807) BP: (150-157)/(113-114) 157/113 (01/09 2101) SpO2:  [94 %-100 %] 94 % (01/09 2101) Last BM Date : 10/03/23  Intake/Output from previous day: No intake/output data recorded. Intake/Output this shift: No intake/output data recorded.  PE: Gen:  Alert, NAD, pleasant Card:  Regular rate and rhythm Pulm:  Normal effort ORA Abd: Soft, minimally tender to deep epigastric palpation, other wise no tender, no guarding  Skin: warm and dry, no rashes  Psych: A&Ox3   Lab Results:  Recent Labs    10/07/23 0452  WBC 8.2  HGB 12.8  HCT 38.9  PLT 112*   BMET Recent Labs    10/08/23 0509 10/09/23 0627  NA 137 138  K 3.1* 3.0*  CL 100 99  CO2 28 28  GLUCOSE 95 110*  BUN <5* <5*  CREATININE 0.55 0.55  CALCIUM 8.5* 8.7*   PT/INR No results for input(s): LABPROT, INR in the last 72 hours. CMP     Component Value Date/Time   NA 138 10/09/2023 0627   K 3.0 (L) 10/09/2023 0627   CL 99 10/09/2023 0627   CO2 28 10/09/2023 0627   GLUCOSE 110 (H) 10/09/2023 0627   BUN <5 (L) 10/09/2023 0627   CREATININE 0.55 10/09/2023 0627   CALCIUM 8.7 (L) 10/09/2023 0627   PROT 6.2 (L) 10/08/2023 0509   ALBUMIN 3.1 (L) 10/08/2023 0509   AST 37 10/08/2023 0509   ALT 29 10/08/2023 0509   ALKPHOS 59 10/08/2023 0509   BILITOT 1.0 10/08/2023 0509   GFRNONAA >60 10/09/2023 0627   GFRAA >60 01/09/2020 1030   Lipase     Component Value Date/Time   LIPASE 200 (H) 10/08/2023 0509       Studies/Results: No results found.  Anti-infectives: Anti-infectives (From admission, onward)    None        Assessment/Plan  Gallstone pancreatitis Will proceed to the OR today. We discussed the  alternatives and potential risks of surgery, including but not limited to: bleeding, infection, damage to bowel or surrounding structures, bile leak, biliary damage, pancreatitis, retained stone, and need for additional procedures. All questions were addressed and consent was obtained.       LOS: 3 days   I reviewed nursing notes, hospitalist notes, last 24 h vitals and pain scores, last 48 h intake and output, last 24 h labs and trends, and last 24 h imaging results.  This care required straight-forward level of medical decision making.   Cordella Idler, MD [facility] Please see Amion for pager number during day hours 7:00am-4:30pm

## 2023-10-09 NOTE — Anesthesia Preprocedure Evaluation (Addendum)
 Anesthesia Evaluation  Patient identified by MRN, date of birth, ID band Patient awake    Reviewed: Allergy & Precautions, NPO status , Patient's Chart, lab work & pertinent test results, reviewed documented beta blocker date and time   History of Anesthesia Complications Negative for: history of anesthetic complications  Airway Mallampati: II  TM Distance: >3 FB     Dental no notable dental hx.    Pulmonary neg COPD, Patient abstained from smoking., neg PE   breath sounds clear to auscultation       Cardiovascular hypertension, (-) CAD, (-) Past MI, (-) Cardiac Stents and (-) CABG  Rhythm:Regular Rate:Normal     Neuro/Psych neg Seizures PSYCHIATRIC DISORDERS Anxiety Depression       GI/Hepatic ,neg GERD  ,,(+) neg Cirrhosis        Endo/Other    Renal/GU Renal disease     Musculoskeletal   Abdominal   Peds  Hematology  (+) Blood dyscrasia, anemia   Anesthesia Other Findings   Reproductive/Obstetrics                             Anesthesia Physical Anesthesia Plan  ASA: 2  Anesthesia Plan: General   Post-op Pain Management:    Induction:   PONV Risk Score and Plan: 1 and Ondansetron   Airway Management Planned: Oral ETT  Additional Equipment:   Intra-op Plan:   Post-operative Plan:   Informed Consent: I have reviewed the patients History and Physical, chart, labs and discussed the procedure including the risks, benefits and alternatives for the proposed anesthesia with the patient or authorized representative who has indicated his/her understanding and acceptance.     Dental advisory given  Plan Discussed with: CRNA  Anesthesia Plan Comments:         Anesthesia Quick Evaluation

## 2023-10-09 NOTE — Transfer of Care (Signed)
 Immediate Anesthesia Transfer of Care Note  Patient: Melissa Guzman  Procedure(s) Performed: DIAGNOSTIC LAPAROSCOPY  Patient Location: PACU  Anesthesia Type:General  Level of Consciousness: awake, alert , oriented, and patient cooperative  Airway & Oxygen Therapy: Patient Spontanous Breathing and Patient connected to face mask oxygen  Post-op Assessment: Report given to RN and Post -op Vital signs reviewed and stable  Post vital signs: Reviewed and stable  Last Vitals:  Vitals Value Taken Time  BP 126/77 10/09/23 1007  Temp    Pulse 96 10/09/23 1011  Resp 17 10/09/23 1011  SpO2 97 % 10/09/23 1011  Vitals shown include unfiled device data.  Last Pain:  Vitals:   10/09/23 0822  TempSrc:   PainSc: 7       Patients Stated Pain Goal: 0 (10/09/23 9177)  Complications: No notable events documented.

## 2023-10-09 NOTE — Discharge Instructions (Signed)

## 2023-10-09 NOTE — Care Plan (Signed)
 Surgery Plan of Care Note  Patient was brought to the operating room for planned cholecystectomy in the setting of gallstone pancreatitis.  There were significant adhesions within the abdomen that made proceeding with the operation of prohibitive risk, therefore, the operation was terminated.   - She can follow up with surgery as an outpatient to discuss the risk vs benefit of proceeding with cholecystectomy for risk prevention  - If she develops signs of recurrent gallstone pancreatitis then we may need to re-evaluate surgery as a possibility during this admission  - Will defer the ongoing management of her pancreatitis to the primary team - Surgery will check on the patient later this afternoon and if no issues then will plan to sign off.  Please reach out with any questions or concerns.  Cordella Idler, MD   General Surgeon [facility], [place]

## 2023-10-09 NOTE — Op Note (Signed)
 Preoperative diagnosis: gallstone pancreatitis   Postoperative diagnosis: same   Procedure: Diagnostic laparoscopy  Surgeon: Cordella Idler, M.D.  Asst: None  Anesthesia: GETA  Indications for procedure: [name] is a 28 y.o. year old female who presented to the ED with abdominal pain and was ultimately found to have pancreatitis.  Her workup revealed a likely gallbladder etiology.  Her abdominal pain improved significantly and we discussed proceeding to the OR for lap chole for risk prevention.  I addressed all of her questions and concerns and written consent was obtained.  Description of procedure: The patient was brought into the operative suite. Anesthesia was administered with General endotracheal anesthesia. WHO checklist was applied. The patient was then placed in supine position. The area was prepped and draped in the usual sterile fashion.  I began by placing a veress needle in the LUQ at Palmer's point.  Following aspiration of air and a positive saline drop test, the abdomen was insufflated to a pressure of . She tolerated this well.  I then placed a 5mm trocar at the supraumbilical position using an opti-view technique.  A laparoscope was introduced and there were no signs of injury from the trocar.  I dome of the gallbladder was visible in the RUQ and appeared normal. The remainder of the gallbladder and the liver were obscured by adhesions between the transverse colon the abdominal wall and liver.  I attempted to use the camera to free up some of the thinner adhesions but they were rather thick and minimal progress was made.  I decided at this point that the risk of injury to the bowel was greater than the benefit of performing the cholecystectomy. I was unable to see the veress needle from the supraumbilical port, so I placed another 5mm port in the LUQ using an optiview technique. I introduced the laparoscope at this location and was able to see that the veress was safely  within the abdomen without evidence of surrounding structures.   All needle, sponge, and instrument counts were reported as correct.  The patient was awoken and brought to PACU by anesthesia.    Findings: Significant adhesions between the transverse colon and abdominal wall and liver consistent with ongoing inflammation.  Specimen: None  Blood loss: Minimal  Local anesthesia:  10 ml marcaine    Complications: None  Cordella Idler, M.D. General Surgery [facility], [place]

## 2023-10-09 NOTE — Anesthesia Postprocedure Evaluation (Signed)
 Anesthesia Post Note  Patient: Melissa Guzman  Procedure(s) Performed: DIAGNOSTIC LAPAROSCOPY     Patient location during evaluation: PACU Anesthesia Type: General Level of consciousness: awake and alert Pain management: pain level controlled Vital Signs Assessment: post-procedure vital signs reviewed and stable Respiratory status: spontaneous breathing, nonlabored ventilation, respiratory function stable and patient connected to nasal cannula oxygen Cardiovascular status: blood pressure returned to baseline and stable Postop Assessment: no apparent nausea or vomiting Anesthetic complications: no   No notable events documented.  Last Vitals:  Vitals:   10/09/23 1030 10/09/23 1037  BP: 118/84 121/86  Pulse: 86 97  Resp: 16   Temp:  (!) 36.4 C  SpO2: 97%     Last Pain:  Vitals:   10/09/23 1037  TempSrc:   PainSc: 0-No pain                 Lynwood MARLA Cornea

## 2023-10-09 NOTE — Anesthesia Procedure Notes (Signed)
 Procedure Name: Intubation Date/Time: 10/09/2023 9:11 AM  Performed by: Rhodia Debby MATSU, CRNAPre-anesthesia Checklist: Patient identified, Emergency Drugs available, Suction available, Patient being monitored and Timeout performed Patient Re-evaluated:Patient Re-evaluated prior to induction Oxygen Delivery Method: Circle system utilized Preoxygenation: Pre-oxygenation with 100% oxygen Induction Type: IV induction Laryngoscope Size: Miller and 2 Grade View: Grade I Tube type: Oral Tube size: 7.0 mm Number of attempts: 1 Airway Equipment and Method: Stylet Placement Confirmation: ETT inserted through vocal cords under direct vision, positive ETCO2 and breath sounds checked- equal and bilateral Secured at: 20 cm Tube secured with: Tape Dental Injury: Teeth and Oropharynx as per pre-operative assessment  Comments: Smooth brief atraumatic dentition unchangd

## 2023-10-09 NOTE — Discharge Summary (Signed)
 PATIENT DETAILS Name: Melissa Guzman Age: 28 y.o. Sex: female Date of Birth: 1995/12/22 MRN: [id number]. Admitting Physician: Melissa LOISE Cleaver, MD ERE:Zrxduju, Melissa HERO, MD  Admit Date: 10/06/2023 Discharge date: 10/09/2023  Recommendations for Outpatient Follow-up:  Follow up with PCP in 1-2 weeks Please obtain CMP/CBC in one week Please ensure follow-up with general surgery for outpatient laparoscopic cholecystectomy.  Admitted From:  Home  Disposition: Home   Discharge Condition: good  CODE STATUS:   Code Status: Full Code   Diet recommendation:  Diet Order             Diet full liquid Room service appropriate? Yes; Fluid consistency: Thin  Diet effective now           Diet full liquid                    Brief Summary: Patient is a 27 y.o.  female with history of HTN-presenting with abdominal pain-found to have gallstone pancreatitis   Significant events: 1/7>> admit to [facility]   Significant studies: 1/5>> CT angio chest: No PE 1/5>> CT abdomen/pelvis: No acute findings 1/7>> RUQ ultrasound: Sludge/stones.   Significant microbiology data: None   Procedures: None   Consults: General surgery  Brief Hospital Course: Gallstone pancreatitis Clinically improved-with supportive care-minimal abdominal pain this morning Tolerating advancement in diet Evaluated by general surgery-she was scheduled for a laparoscopic cholecystectomy today-however intraoperatively she was found to have significant adhesions-and the procedure was aborted.  Per general surgery-stable for discharge later today-with plans to follow-up in the outpatient setting-for an elective cholecystectomy.I have asked the patient to stay on a full liquid/soft diet for several more days before advancing further.    HTN BP stable Continue amlodipine    Hypokalemia/hypomagnesemia Will replete prior to discharge-recheck in 1 week at PCPs office.   BMI: Estimated body mass index is 26.66 kg/m  as calculated from the following:   Height as of this encounter: 5' 1 (1.549 m).   Weight as of this encounter: 64 kg  Discharge Diagnoses:  Principal Problem:   Acute pancreatitis Active Problems:   Essential hypertension   Hypokalemia   AKI (acute kidney injury) [facility])   Discharge Instructions:  Activity:  As tolerated    Discharge Instructions     Call MD for:  persistant nausea and vomiting   Complete by: As directed    Call MD for:  redness, tenderness, or signs of infection (pain, swelling, redness, odor or green/yellow discharge around incision site)   Complete by: As directed    Call MD for:  severe uncontrolled pain   Complete by: As directed    Diet full liquid   Complete by: As directed    Discharge instructions   Complete by: As directed    Follow with Primary MD  Melissa Melissa HERO, MD in 1-2 weeks  Follow-up with [facility] surgery-the office will call you with a follow-up appointment  Stay on a full liquid/soft diet for the next several days before advancing further.  Please get a complete blood count and chemistry panel checked by your Primary MD at your next visit, and again as instructed by your Primary MD.  Get Medicines reviewed and adjusted: Please take all your medications with you for your next visit with your Primary MD  Laboratory/radiological data: Please request your Primary MD to go over all hospital tests and procedure/radiological results at the follow up, please ask your Primary MD to get all Hospital records sent to his/her  office.  In some cases, they will be blood work, cultures and biopsy results pending at the time of your discharge. Please request that your primary care M.D. follows up on these results.  Also Note the following: If you experience worsening of your admission symptoms, develop shortness of breath, life threatening emergency, suicidal or homicidal thoughts you must seek medical attention immediately by calling  911 or calling your MD immediately  if symptoms less severe.  You must read complete instructions/literature along with all the possible adverse reactions/side effects for all the Medicines you take and that have been prescribed to you. Take any new Medicines after you have completely understood and accpet all the possible adverse reactions/side effects.   Do not drive when taking Pain medications or sleeping medications (Benzodaizepines)  Do not take more than prescribed Pain, Sleep and Anxiety Medications. It is not advisable to combine anxiety,sleep and pain medications without talking with your primary care practitioner  Special Instructions: If you have smoked or chewed Tobacco  in the last 2 yrs please stop smoking, stop any regular Alcohol  and or any Recreational drug use.  Wear Seat belts while driving.  Please note: You were cared for by a hospitalist during your hospital stay. Once you are discharged, your primary care physician will handle any further medical issues. Please note that NO REFILLS for any discharge medications will be authorized once you are discharged, as it is imperative that you return to your primary care physician (or establish a relationship with a primary care physician if you do not have one) for your post hospital discharge needs so that they can reassess your need for medications and monitor your lab values.   Increase activity slowly   Complete by: As directed       Allergies as of 10/09/2023   No Known Allergies      Medication List     TAKE these medications    Acetaminophen  Extra Strength 500 MG Tabs Take 2 tablets (1,000 mg total) by mouth every 8 (eight) hours as needed (pain).   amLODipine  5 MG tablet Commonly known as: NORVASC  Take 1 tablet (5 mg total) by mouth daily.   ondansetron  4 MG disintegrating tablet Commonly known as: ZOFRAN -ODT Take 1 tablet (4 mg total) by mouth every 8 (eight) hours as needed.   pantoprazole  40 MG  tablet Commonly known as: PROTONIX  Take 40 mg by mouth daily.   potassium chloride  SA 20 MEQ tablet Commonly known as: KLOR-CON  M Take 1 tablet (20 mEq total) by mouth daily for 4 days.        Follow-up Information     Melissa Melissa HERO, MD. Schedule an appointment as soon as possible for a visit in 1 week(s).   Specialty: Family Medicine Contact information: [address] [place] [address] [phone]         [facility] Follow up.   Why: Office will call with date/time, If you dont hear from them,please give them a call Contact information: [address] [place]  [phone]               No Known Allergies   Other Procedures/Studies: US  Abdomen Limited RUQ (LIVER/GB) Result Date: 10/06/2023 CLINICAL DATA:  Pancreatitis. EXAM: ULTRASOUND ABDOMEN LIMITED RIGHT UPPER QUADRANT COMPARISON:  Abdomen and pelvis CT 10/04/2023 FINDINGS: Gallbladder: There is some minimal dependent sludge in the lumen of the gallbladder with probable associated tiny sandlike stones. These findings were  not evident on CT imaging 2 days ago. 3 mm cholesterol polyp identified towards the fundus. No gallbladder wall thickening or pericholecystic fluid Common bile duct: Diameter: 4 mm Liver: No focal lesion identified. Echogenic liver parenchyma is nonspecific but suggests fatty deposition. Portal vein is patent on color Doppler imaging with normal direction of blood flow towards the liver. Other: Trace perihepatic free fluid evident. IMPRESSION: 1. Minimal dependent sludge in the lumen of the gallbladder with probable associated tiny sandlike stones. These findings were not evident on CT imaging 2 days ago. No gallbladder wall thickening or pericholecystic fluid. 2. 3 mm cholesterol polyp towards the fundus. 3. Echogenic liver parenchyma is nonspecific but suggests fatty deposition. 4. Trace perihepatic free fluid. Electronically  Signed   By: Camellia Candle M.D.   On: 10/06/2023 07:09   CT ABDOMEN PELVIS W CONTRAST Result Date: 10/04/2023 CLINICAL DATA:  Epigastric pain, chest pain and abdominal pain. EXAM: CT ABDOMEN AND PELVIS WITH CONTRAST TECHNIQUE: Multidetector CT imaging of the abdomen and pelvis was performed using the standard protocol following bolus administration of intravenous contrast. RADIATION DOSE REDUCTION: This exam was performed according to the departmental dose-optimization program which includes automated exposure control, adjustment of the mA and/or kV according to patient size and/or use of iterative reconstruction technique. CONTRAST:  80mL OMNIPAQUE  IOHEXOL  300 MG/ML  SOLN COMPARISON:  05/26/2023 FINDINGS: Lower chest: No acute abnormality. Hepatobiliary: Marked diffuse hepatic steatosis. More focal fatty deposition is noted about the falciform ligament. No suspicious liver abnormality identified. Pancreas: No main duct dilatation or mass. No significant changes of pancreatitis identified at this time. Specifically there is no peripancreatic fat stranding, free fluid, necrosis or pseudocyst. Spleen: Normal in size without focal abnormality. Adrenals/Urinary Tract: Normal adrenal glands. No hydronephrosis or mass. Urinary bladder appears normal. Stomach/Bowel: Small hiatal hernia. The appendix is visualized and appears normal. No pathologic dilatation of the large or small bowel loops to suggest an obstruction. No bowel wall thickening or inflammatory fat stranding. Vascular/Lymphatic: No significant vascular findings are present. No enlarged abdominal or pelvic lymph nodes. Reproductive: Uterus appears normal. Prominent bilateral ovaries with multiple peripheral follicles identified measuring up to 1 cm. These appear unchanged from the previous exam. No adnexal mass identified. Other: No ascites or focal fluid collections identified. No signs of pneumoperitoneum. Musculoskeletal: No acute or significant osseous  findings. Chronic, bilateral L5 pars defects identified without significant anterolisthesis. IMPRESSION: 1. No acute findings within the abdomen or pelvis. 2. Marked diffuse hepatic steatosis. 3. Small hiatal hernia. 4. Prominent bilateral ovaries with multiple peripheral follicles identified measuring up to 1 cm. These appear unchanged from the previous exam. Correlate for any clinical signs or symptoms of polycystic ovarian syndrome. 5. 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 Electronically Signed   By: Waddell Calk M.D.   On: 10/04/2023 16:33   CT Angio Chest PE W and/or Wo Contrast Result Date: 10/04/2023 CLINICAL DATA:  Tachycardia, lightheadedness, concern for pulmonary embolism. EXAM: CT ANGIOGRAPHY CHEST WITH CONTRAST TECHNIQUE: Multidetector CT imaging of the chest was performed using the standard protocol during bolus administration of intravenous contrast. Multiplanar CT image reconstructions and MIPs were obtained to evaluate the vascular anatomy. RADIATION DOSE REDUCTION: This exam was performed according to the departmental dose-optimization program which includes automated exposure control, adjustment of the mA and/or kV according to patient size and/or use of iterative reconstruction technique. CONTRAST:  75mL OMNIPAQUE  IOHEXOL  350 MG/ML SOLN COMPARISON:  Same day chest radiograph. FINDINGS: Cardiovascular: Satisfactory opacification of the pulmonary  arteries to the segmental level. No evidence of pulmonary embolism. Normal heart size. No pericardial effusion. Mediastinum/Nodes: No enlarged mediastinal, hilar, or axillary lymph nodes. There is a small hiatal hernia. Thyroid  gland, trachea, and esophagus demonstrate no significant findings. Lungs/Pleura: Lungs are clear. No pleural effusion or pneumothorax. Upper Abdomen: The liver is diffusely hypoattenuating, consistent with hepatic steatosis. Musculoskeletal: No chest wall abnormality. No acute or significant  osseous findings. Review of the MIP images confirms the above findings. IMPRESSION: 1. No evidence of pulmonary embolism or other acute intrathoracic process. 2. Hepatic steatosis. Electronically Signed   By: Norman Hopper M.D.   On: 10/04/2023 16:16   DG Chest 2 View Result Date: 10/04/2023 CLINICAL DATA:  Tachycardia, lightheadedness EXAM: CHEST - 2 VIEW COMPARISON:  Chest radiograph dated 08/28/2023. FINDINGS: The heart size and mediastinal contours are within normal limits. Both lungs are clear. The visualized skeletal structures are unremarkable. IMPRESSION: No active cardiopulmonary disease. Electronically Signed   By: Norman Hopper M.D.   On: 10/04/2023 15:02     TODAY-DAY OF DISCHARGE:  Subjective:   Melissa Guzman today has no headache,no chest abdominal pain,no new weakness tingling or numbness, feels much better wants to go home today.   Objective:   Blood pressure 121/86, pulse 97, temperature (!) 97.5 F (36.4 C), resp. rate 16, height 5' 1 (1.549 m), weight 63.5 kg, last menstrual period 09/13/2023, SpO2 97%, not currently breastfeeding.  Intake/Output Summary (Last 24 hours) at 10/09/2023 1322 Last data filed at 10/09/2023 1008 Gross per 24 hour  Intake 300 ml  Output 5 ml  Net 295 ml   Filed Weights   10/06/23 0003 10/06/23 1740 10/09/23 0809  Weight: 65.8 kg 64 kg 63.5 kg    Exam: Awake Alert, Oriented *3, No new F.N deficits, Normal affect Loyal.AT,PERRAL Supple Neck,No JVD, No cervical lymphadenopathy appriciated.  Symmetrical Chest wall movement, Good air movement bilaterally, CTAB RRR,No Gallops,Rubs or new Murmurs, No Parasternal Heave +ve B.Sounds, Abd Soft, Non tender, No organomegaly appriciated, No rebound -guarding or rigidity. No Cyanosis, Clubbing or edema, No new Rash or bruise   PERTINENT RADIOLOGIC STUDIES: No results found.   PERTINENT LAB RESULTS: CBC: Recent Labs    10/07/23 0452  WBC 8.2  HGB 12.8  HCT 38.9  PLT 112*   CMET CMP      Component Value Date/Time   NA 138 10/09/2023 0627   K 3.0 (L) 10/09/2023 0627   CL 99 10/09/2023 0627   CO2 28 10/09/2023 0627   GLUCOSE 110 (H) 10/09/2023 0627   BUN <5 (L) 10/09/2023 0627   CREATININE 0.55 10/09/2023 0627   CALCIUM 8.7 (L) 10/09/2023 0627   PROT 6.2 (L) 10/08/2023 0509   ALBUMIN 3.1 (L) 10/08/2023 0509   AST 37 10/08/2023 0509   ALT 29 10/08/2023 0509   ALKPHOS 59 10/08/2023 0509   BILITOT 1.0 10/08/2023 0509   GFRNONAA >60 10/09/2023 0627    GFR Estimated Creatinine Clearance: 91.8 mL/min (by C-G formula based on SCr of 0.55 mg/dL). Recent Labs    10/08/23 0509  LIPASE 200*   No results for input(s): CKTOTAL, CKMB, CKMBINDEX, TROPONINI in the last 72 hours. Invalid input(s): POCBNP No results for input(s): DDIMER in the last 72 hours. No results for input(s): HGBA1C in the last 72 hours. No results for input(s): CHOL, HDL, LDLCALC, TRIG, CHOLHDL, LDLDIRECT in the last 72 hours. No results for input(s): TSH, T4TOTAL, T3FREE, THYROIDAB in the last 72 hours.  Invalid input(s): FREET3 No results  for input(s): VITAMINB12, FOLATE, FERRITIN, TIBC, IRON , RETICCTPCT in the last 72 hours. Coags: No results for input(s): INR in the last 72 hours.  Invalid input(s): PT Microbiology: Recent Results (from the past 240 hours)  Resp panel by RT-PCR (RSV, Flu A&B, Covid) Anterior Nasal Swab     Status: None   Collection Time: 10/04/23  1:19 PM   Specimen: Anterior Nasal Swab  Result Value Ref Range Status   SARS Coronavirus 2 by RT PCR NEGATIVE NEGATIVE Final    Comment: (NOTE) SARS-CoV-2 target nucleic acids are NOT DETECTED.  The SARS-CoV-2 RNA is generally detectable in upper respiratory specimens during the acute phase of infection. The lowest concentration of SARS-CoV-2 viral copies this assay can detect is 138 copies/mL. A negative result does not preclude SARS-Cov-2 infection and should not be used as  the sole basis for treatment or other patient management decisions. A negative result may occur with  improper specimen collection/handling, submission of specimen other than nasopharyngeal swab, presence of viral mutation(s) within the areas targeted by this assay, and inadequate number of viral copies(<138 copies/mL). A negative result must be combined with clinical observations, patient history, and epidemiological information. The expected result is Negative.  Fact Sheet for Patients:  bloggercourse.com  Fact Sheet for Healthcare Providers:  seriousbroker.it  This test is no t yet approved or cleared by the [place]  FDA and  has been authorized for detection and/or diagnosis of SARS-CoV-2 by FDA under an Emergency Use Authorization (EUA). This EUA will remain  in effect (meaning this test can be used) for the duration of the COVID-19 declaration under Section 564(b)(1) of the Act, 21 U.S.C.section 360bbb-3(b)(1), unless the authorization is terminated  or revoked sooner.       Influenza A by PCR NEGATIVE NEGATIVE Final   Influenza B by PCR NEGATIVE NEGATIVE Final    Comment: (NOTE) The Xpert Xpress SARS-CoV-2/FLU/RSV plus assay is intended as an aid in the diagnosis of influenza from Nasopharyngeal swab specimens and should not be used as a sole basis for treatment. Nasal washings and aspirates are unacceptable for Xpert Xpress SARS-CoV-2/FLU/RSV testing.  Fact Sheet for Patients: bloggercourse.com  Fact Sheet for Healthcare Providers: seriousbroker.it  This test is not yet approved or cleared by the [place]  FDA and has been authorized for detection and/or diagnosis of SARS-CoV-2 by FDA under an Emergency Use Authorization (EUA). This EUA will remain in effect (meaning this test can be used) for the duration of the COVID-19 declaration under Section 564(b)(1) of  the Act, 21 U.S.C. section 360bbb-3(b)(1), unless the authorization is terminated or revoked.     Resp Syncytial Virus by PCR NEGATIVE NEGATIVE Final    Comment: (NOTE) Fact Sheet for Patients: bloggercourse.com  Fact Sheet for Healthcare Providers: seriousbroker.it  This test is not yet approved or cleared by the [place]  FDA and has been authorized for detection and/or diagnosis of SARS-CoV-2 by FDA under an Emergency Use Authorization (EUA). This EUA will remain in effect (meaning this test can be used) for the duration of the COVID-19 declaration under Section 564(b)(1) of the Act, 21 U.S.C. section 360bbb-3(b)(1), unless the authorization is terminated or revoked.  Performed at [facility], [address] [place], [place] [address]   Urine Culture     Status: Abnormal   Collection Time: 10/04/23  1:50 PM   Specimen: Urine, Random  Result Value Ref Range Status   Specimen Description   Final    URINE, RANDOM Performed at [facility], [address] [place], [place] [address]    Special Requests   Final    NONE Reflexed from [id number] Performed at [facility], [address], [place], [place] [address]    Culture >=100,000 COLONIES/mL ESCHERICHIA COLI (A)  Final   Report Status 10/06/2023 FINAL  Final   Organism ID, Bacteria ESCHERICHIA COLI (A)  Final      Susceptibility   Escherichia coli - MIC*    AMPICILLIN <=2 SENSITIVE Sensitive     CEFAZOLIN  <=4 SENSITIVE Sensitive     CEFEPIME <=0.12 SENSITIVE Sensitive     CEFTRIAXONE  <=0.25 SENSITIVE Sensitive     CIPROFLOXACIN <=0.25 SENSITIVE Sensitive     GENTAMICIN <=1 SENSITIVE Sensitive     IMIPENEM <=0.25 SENSITIVE Sensitive     NITROFURANTOIN  <=16 SENSITIVE Sensitive     TRIMETH/SULFA <=20 SENSITIVE Sensitive     AMPICILLIN/SULBACTAM <=2 SENSITIVE Sensitive     PIP/TAZO <=4 SENSITIVE Sensitive  ug/mL    * >=100,000 COLONIES/mL ESCHERICHIA COLI  Urine Culture (for pregnant, neutropenic or urologic patients or patients with an indwelling urinary catheter)     Status: None   Collection Time: 10/06/23  3:07 PM   Specimen: Urine, Clean Catch  Result Value Ref Range Status   Specimen Description URINE, CLEAN CATCH  Final   Special Requests NONE  Final   Culture   Final    NO GROWTH Performed at [facility], [address], [place], [place] [address]    Report Status 10/07/2023 FINAL  Final    FURTHER DISCHARGE INSTRUCTIONS:  Get Medicines reviewed and adjusted: Please take all your medications with you for your next visit with your Primary MD  Laboratory/radiological data: Please request your Primary MD to go over all hospital tests and procedure/radiological results at the follow up, please ask your Primary MD to get all Hospital records sent to his/her office.  In some cases, they will be blood work, cultures and biopsy results pending at the time of your discharge. Please request that your primary care M.D. goes through all the records of your hospital data and follows up on these results.  Also Note the following: If you experience worsening of your admission symptoms, develop shortness of breath, life threatening emergency, suicidal or homicidal thoughts you must seek medical attention immediately by calling 911 or calling your MD immediately  if symptoms less severe.  You must read complete instructions/literature along with all the possible adverse reactions/side effects for all the Medicines you take and that have been prescribed to you. Take any new Medicines after you have completely understood and accpet all the possible adverse reactions/side effects.   Do not drive when taking Pain medications or sleeping medications (Benzodaizepines)  Do not take more than prescribed Pain, Sleep and Anxiety Medications. It is not advisable to combine anxiety,sleep and pain  medications without talking with your primary care practitioner  Special Instructions: If you have smoked or chewed Tobacco  in the last 2 yrs please stop smoking, stop any regular Alcohol  and or any Recreational drug use.  Wear Seat belts while driving.  Please note: You were cared for by a hospitalist during your hospital stay. Once you are discharged, your primary care physician will handle any further medical issues. Please note that NO REFILLS for any discharge medications will be authorized once you are discharged, as it is imperative that you return to your primary care physician (or establish a relationship with a primary care physician if  you do not have one) for your post hospital discharge needs so that they can reassess your need for medications and monitor your lab values.  Total Time spent coordinating discharge including counseling, education and face to face time equals greater than 30 minutes.  Signed: Damontay Alred 10/09/2023 1:22 PM

## 2023-10-09 NOTE — Plan of Care (Signed)
  Problem: Education: Goal: Knowledge of General Education information will improve Description: Including pain rating scale, medication(s)/side effects and non-pharmacologic comfort measures Outcome: Progressing   Problem: Health Behavior/Discharge Planning: Goal: Ability to manage health-related needs will improve Outcome: Progressing   Problem: Clinical Measurements: Goal: Ability to maintain clinical measurements within normal limits will improve Outcome: Progressing Goal: Will remain free from infection Outcome: Progressing Goal: Diagnostic test results will improve Outcome: Progressing Goal: Respiratory complications will improve Outcome: Progressing Goal: Cardiovascular complication will be avoided Outcome: Progressing   Problem: Activity: Goal: Risk for activity intolerance will decrease Outcome: Progressing   Problem: Nutrition: Goal: Adequate nutrition will be maintained Outcome: Progressing   Problem: Elimination: Goal: Will not experience complications related to bowel motility Outcome: Progressing Goal: Will not experience complications related to urinary retention Outcome: Progressing   Problem: Pain Management: Goal: General experience of comfort will improve Outcome: Progressing   Problem: Safety: Goal: Ability to remain free from injury will improve Outcome: Progressing   Problem: Skin Integrity: Goal: Risk for impaired skin integrity will decrease Outcome: Progressing

## 2023-10-10 ENCOUNTER — Encounter (HOSPITAL_COMMUNITY): Payer: Self-pay | Admitting: General Surgery

## 2023-10-20 ENCOUNTER — Ambulatory Visit: Payer: Medicaid Other | Admitting: Family Medicine

## 2023-10-20 ENCOUNTER — Encounter: Payer: Medicaid Other | Admitting: Obstetrics and Gynecology

## 2023-10-20 NOTE — Progress Notes (Deleted)
[facility] GYNECOLOGY OFFICE VISIT NOTE  History:   Melissa Guzman is a 28 y.o. G1P1001 here today for hospital follow up.  Melissa Guzman was admitted from 1/7 - 1/10 for gallstone pancreatitis and acute cholecystitis She was taken to OR however on initial laparoscopy there were significant adhesions, particularly of the transverse colon to the anterior abdominal wall, and due to high risk of bowel injury the procedure was abandoned At discharge patient was mildly hypokalemic and thrombocytopenic Per d/c summary patient to be scheduled f/u with [facility] as an outpatient and have CBC/CMP rechecked in one week  Today reports ***  Health Maintenance Due  Topic Date Due   HPV VACCINES (1 - 3-dose series) Never done   INFLUENZA VACCINE  Never done   COVID-19 Vaccine (3 - 2024-25 season) 05/31/2023    Past Medical History:  Diagnosis Date   Acute blood loss anemia 03/01/2022   Anemia    COVID-19 affecting pregnancy in third trimester 02/04/2022   +01/28/2022   Endometritis 03/05/2022   Hypertension    Postpartum septic endometritis 03/09/2022   UTI (urinary tract infection) 05/26/2023    Past Surgical History:  Procedure Laterality Date   CESAREAN SECTION N/A 03/01/2022   Procedure: CESAREAN SECTION;  Surgeon: Hermina Staggers, MD;  Location: [facility] LD ORS;  Service: Obstetrics;  Laterality: N/A;   DIAGNOSTIC LAPAROSCOPIC LIVER BIOPSY N/A 10/09/2023   Procedure: DIAGNOSTIC LAPAROSCOPY;  Surgeon: Moise Boring, MD;  Location: [facility] OR;  Service: General;  Laterality: N/A;   IR RADIOLOGIST EVAL & MGMT  03/28/2022   IR RADIOLOGIST EVAL & MGMT  04/10/2022    The following portions of the patient's history were reviewed and updated as appropriate: allergies, current medications, past family history, past medical history, past social history, past surgical history and problem list.   Health Maintenance:   Last pap: Lab Results  Component Value Date   DIAGPAP  11/24/2022    - Negative for  Intraepithelial Lesions or Malignancy (NILM)   DIAGPAP - Benign reactive/reparative changes 11/24/2022   ***  Last mammogram:  ***  ***reminder message  Review of Systems:  Pertinent items noted in HPI and remainder of comprehensive ROS otherwise negative.  Physical Exam:  LMP 09/13/2023 (Approximate)  CONSTITUTIONAL: Well-developed, well-nourished female in no acute distress.  HEENT:  Normocephalic, atraumatic. External right and left ear normal. No scleral icterus.  NECK: Normal range of motion, supple, no masses noted on observation SKIN: No rash noted. Not diaphoretic. No erythema. No pallor. MUSCULOSKELETAL: Normal range of motion. No edema noted. NEUROLOGIC: Alert and oriented to person, place, and time. Normal muscle tone coordination.  PSYCHIATRIC: Normal mood and affect. Normal behavior. Normal judgment and thought content. RESPIRATORY: Effort normal, no problems with respiration noted ABDOMEN: No masses noted. No other overt distention noted.  *** PELVIC: {Blank single:19197::"Deferred","Normal appearing external genitalia; normal appearing vaginal mucosa and cervix.  No abnormal discharge noted.  Normal uterine size, no other palpable masses, no uterine or adnexal tenderness."}  Labs and Imaging No results found for this or any previous visit (from the past week). US Abdomen Limited RUQ (LIVER/GB) Result Date: 10/06/2023 CLINICAL DATA:  Pancreatitis. EXAM: ULTRASOUND ABDOMEN LIMITED RIGHT UPPER QUADRANT COMPARISON:  Abdomen and pelvis CT 10/04/2023 FINDINGS: Gallbladder: There is some minimal dependent sludge in the lumen of the gallbladder with probable associated tiny sandlike stones. These findings were not evident on CT imaging 2 days ago. 3 mm cholesterol polyp identified towards the fundus. No gallbladder wall thickening  or pericholecystic fluid Common bile duct: Diameter: 4 mm Liver: No focal lesion identified. Echogenic liver parenchyma is nonspecific but suggests fatty  deposition. Portal vein is patent on color Doppler imaging with normal direction of blood flow towards the liver. Other: Trace perihepatic free fluid evident. IMPRESSION: 1. Minimal dependent sludge in the lumen of the gallbladder with probable associated tiny sandlike stones. These findings were not evident on CT imaging 2 days ago. No gallbladder wall thickening or pericholecystic fluid. 2. 3 mm cholesterol polyp towards the fundus. 3. Echogenic liver parenchyma is nonspecific but suggests fatty deposition. 4. Trace perihepatic free fluid. Electronically Signed   By: Kennith Center M.D.   On: 10/06/2023 07:09   CT ABDOMEN PELVIS W CONTRAST Result Date: 10/04/2023 CLINICAL DATA:  Epigastric pain, chest pain and abdominal pain. EXAM: CT ABDOMEN AND PELVIS WITH CONTRAST TECHNIQUE: Multidetector CT imaging of the abdomen and pelvis was performed using the standard protocol following bolus administration of intravenous contrast. RADIATION DOSE REDUCTION: This exam was performed according to the departmental dose-optimization program which includes automated exposure control, adjustment of the mA and/or kV according to patient size and/or use of iterative reconstruction technique. CONTRAST:  80mL OMNIPAQUE IOHEXOL 300 MG/ML  SOLN COMPARISON:  05/26/2023 FINDINGS: Lower chest: No acute abnormality. Hepatobiliary: Marked diffuse hepatic steatosis. More focal fatty deposition is noted about the falciform ligament. No suspicious liver abnormality identified. Pancreas: No main duct dilatation or mass. No significant changes of pancreatitis identified at this time. Specifically there is no peripancreatic fat stranding, free fluid, necrosis or pseudocyst. Spleen: Normal in size without focal abnormality. Adrenals/Urinary Tract: Normal adrenal glands. No hydronephrosis or mass. Urinary bladder appears normal. Stomach/Bowel: Small hiatal hernia. The appendix is visualized and appears normal. No pathologic dilatation of the  large or small bowel loops to suggest an obstruction. No bowel wall thickening or inflammatory fat stranding. Vascular/Lymphatic: No significant vascular findings are present. No enlarged abdominal or pelvic lymph nodes. Reproductive: Uterus appears normal. Prominent bilateral ovaries with multiple peripheral follicles identified measuring up to 1 cm. These appear unchanged from the previous exam. No adnexal mass identified. Other: No ascites or focal fluid collections identified. No signs of pneumoperitoneum. Musculoskeletal: No acute or significant osseous findings. Chronic, bilateral L5 pars defects identified without significant anterolisthesis. IMPRESSION: 1. No acute findings within the abdomen or pelvis. 2. Marked diffuse hepatic steatosis. 3. Small hiatal hernia. 4. Prominent bilateral ovaries with multiple peripheral follicles identified measuring up to 1 cm. These appear unchanged from the previous exam. Correlate for any clinical signs or symptoms of polycystic ovarian syndrome. 5. 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 Electronically Signed   By: Signa Kell M.D.   On: 10/04/2023 16:33   CT Angio Chest PE W and/or Wo Contrast Result Date: 10/04/2023 CLINICAL DATA:  Tachycardia, lightheadedness, concern for pulmonary embolism. EXAM: CT ANGIOGRAPHY CHEST WITH CONTRAST TECHNIQUE: Multidetector CT imaging of the chest was performed using the standard protocol during bolus administration of intravenous contrast. Multiplanar CT image reconstructions and MIPs were obtained to evaluate the vascular anatomy. RADIATION DOSE REDUCTION: This exam was performed according to the departmental dose-optimization program which includes automated exposure control, adjustment of the mA and/or kV according to patient size and/or use of iterative reconstruction technique. CONTRAST:  75mL OMNIPAQUE IOHEXOL 350 MG/ML SOLN COMPARISON:  Same day chest radiograph. FINDINGS: Cardiovascular:  Satisfactory opacification of the pulmonary arteries to the segmental level. No evidence of pulmonary embolism. Normal heart size. No pericardial effusion. Mediastinum/Nodes: No enlarged  mediastinal, hilar, or axillary lymph nodes. There is a small hiatal hernia. Thyroid gland, trachea, and esophagus demonstrate no significant findings. Lungs/Pleura: Lungs are clear. No pleural effusion or pneumothorax. Upper Abdomen: The liver is diffusely hypoattenuating, consistent with hepatic steatosis. Musculoskeletal: No chest wall abnormality. No acute or significant osseous findings. Review of the MIP images confirms the above findings. IMPRESSION: 1. No evidence of pulmonary embolism or other acute intrathoracic process. 2. Hepatic steatosis. Electronically Signed   By: Romona Curls M.D.   On: 10/04/2023 16:16   DG Chest 2 View Result Date: 10/04/2023 CLINICAL DATA:  Tachycardia, lightheadedness EXAM: CHEST - 2 VIEW COMPARISON:  Chest radiograph dated 08/28/2023. FINDINGS: The heart size and mediastinal contours are within normal limits. Both lungs are clear. The visualized skeletal structures are unremarkable. IMPRESSION: No active cardiopulmonary disease. Electronically Signed   By: Romona Curls M.D.   On: 10/04/2023 15:02      Assessment and Plan:   Problem List Items Addressed This Visit   None   Routine preventative health maintenance measures emphasized. Please refer to After Visit Summary for other counseling recommendations.   No follow-ups on file.    Total face-to-face time with patient: {Blank single:19197::"10","15","20","25","30"} minutes.  Over 50% of encounter was spent on counseling and coordination of care.   Venora Maples, MD/MPH Attending Family Medicine Physician, [facility] for [facility], [facility]

## 2023-11-17 ENCOUNTER — Encounter (HOSPITAL_BASED_OUTPATIENT_CLINIC_OR_DEPARTMENT_OTHER): Payer: Self-pay | Admitting: Emergency Medicine

## 2023-11-17 ENCOUNTER — Inpatient Hospital Stay (HOSPITAL_BASED_OUTPATIENT_CLINIC_OR_DEPARTMENT_OTHER)
Admission: EM | Admit: 2023-11-17 | Discharge: 2023-11-18 | DRG: 684 | Disposition: A | Discharge status: Home / Self Care | Payer: Medicaid Other | Attending: Internal Medicine | Admitting: Internal Medicine

## 2023-11-17 ENCOUNTER — Emergency Department (HOSPITAL_BASED_OUTPATIENT_CLINIC_OR_DEPARTMENT_OTHER): Payer: Medicaid Other

## 2023-11-17 ENCOUNTER — Other Ambulatory Visit: Payer: Self-pay

## 2023-11-17 DIAGNOSIS — Z8616 Personal history of COVID-19: Secondary | ICD-10-CM

## 2023-11-17 DIAGNOSIS — E876 Hypokalemia: Secondary | ICD-10-CM | POA: Diagnosis present

## 2023-11-17 DIAGNOSIS — Z79899 Other long term (current) drug therapy: Secondary | ICD-10-CM

## 2023-11-17 DIAGNOSIS — K2900 Acute gastritis without bleeding: Secondary | ICD-10-CM

## 2023-11-17 DIAGNOSIS — I10 Essential (primary) hypertension: Secondary | ICD-10-CM | POA: Diagnosis present

## 2023-11-17 DIAGNOSIS — K219 Gastro-esophageal reflux disease without esophagitis: Secondary | ICD-10-CM | POA: Diagnosis present

## 2023-11-17 DIAGNOSIS — K8020 Calculus of gallbladder without cholecystitis without obstruction: Secondary | ICD-10-CM | POA: Diagnosis present

## 2023-11-17 DIAGNOSIS — R9431 Abnormal electrocardiogram [ECG] [EKG]: Secondary | ICD-10-CM | POA: Diagnosis present

## 2023-11-17 DIAGNOSIS — N179 Acute kidney failure, unspecified: Secondary | ICD-10-CM | POA: Diagnosis present

## 2023-11-17 DIAGNOSIS — K8050 Calculus of bile duct without cholangitis or cholecystitis without obstruction: Secondary | ICD-10-CM | POA: Insufficient documentation

## 2023-11-17 DIAGNOSIS — R112 Nausea with vomiting, unspecified: Secondary | ICD-10-CM

## 2023-11-17 DIAGNOSIS — K2970 Gastritis, unspecified, without bleeding: Secondary | ICD-10-CM | POA: Insufficient documentation

## 2023-11-17 DIAGNOSIS — R1084 Generalized abdominal pain: Secondary | ICD-10-CM | POA: Diagnosis present

## 2023-11-17 LAB — I-STAT VENOUS BLOOD GAS, ED
Acid-Base Excess: 5 mmol/L — ABNORMAL HIGH (ref 0.0–2.0)
Bicarbonate: 30.2 mmol/L — ABNORMAL HIGH (ref 20.0–28.0)
Calcium, Ion: 1.17 mmol/L (ref 1.15–1.40)
HCT: 35 % — ABNORMAL LOW (ref 36.0–46.0)
Hemoglobin: 11.9 g/dL — ABNORMAL LOW (ref 12.0–15.0)
O2 Saturation: 65 %
Potassium: 3.8 mmol/L (ref 3.5–5.1)
Sodium: 133 mmol/L — ABNORMAL LOW (ref 135–145)
TCO2: 32 mmol/L (ref 22–32)
pCO2, Ven: 46.6 mm[Hg] (ref 44–60)
pH, Ven: 7.419 (ref 7.25–7.43)
pO2, Ven: 34 mm[Hg] (ref 32–45)

## 2023-11-17 LAB — URINALYSIS, W/ REFLEX TO CULTURE (INFECTION SUSPECTED)
Bacteria, UA: NONE SEEN
Bilirubin Urine: NEGATIVE
Glucose, UA: NEGATIVE mg/dL
Ketones, ur: 15 mg/dL — AB
Leukocytes,Ua: NEGATIVE
Nitrite: NEGATIVE
Protein, ur: 30 mg/dL — AB
Specific Gravity, Urine: 1.015 (ref 1.005–1.030)
pH: 6 (ref 5.0–8.0)

## 2023-11-17 LAB — COMPREHENSIVE METABOLIC PANEL
ALT: 27 U/L (ref 0–44)
AST: 61 U/L — ABNORMAL HIGH (ref 15–41)
Albumin: 5.1 g/dL — ABNORMAL HIGH (ref 3.5–5.0)
Alkaline Phosphatase: 87 U/L (ref 38–126)
Anion gap: 19 — ABNORMAL HIGH (ref 5–15)
BUN: 13 mg/dL (ref 6–20)
CO2: 25 mmol/L (ref 22–32)
Calcium: 10.7 mg/dL — ABNORMAL HIGH (ref 8.9–10.3)
Chloride: 90 mmol/L — ABNORMAL LOW (ref 98–111)
Creatinine, Ser: 2.4 mg/dL — ABNORMAL HIGH (ref 0.44–1.00)
GFR, Estimated: 28 mL/min — ABNORMAL LOW (ref >60)
Glucose, Bld: 142 mg/dL — ABNORMAL HIGH (ref 70–99)
Potassium: 2.7 mmol/L — CL (ref 3.5–5.1)
Sodium: 134 mmol/L — ABNORMAL LOW (ref 135–145)
Total Bilirubin: 1 mg/dL (ref 0.0–1.2)
Total Protein: 10 g/dL — ABNORMAL HIGH (ref 6.5–8.1)

## 2023-11-17 LAB — CBC
HCT: 37.9 % (ref 36.0–46.0)
Hemoglobin: 12.7 g/dL (ref 12.0–15.0)
MCH: 31 pg (ref 26.0–34.0)
MCHC: 33.5 g/dL (ref 30.0–36.0)
MCV: 92.4 fL (ref 80.0–100.0)
Platelets: 189 10*3/uL (ref 150–400)
RBC: 4.1 MIL/uL (ref 3.87–5.11)
RDW: 14.6 % (ref 11.5–15.5)
WBC: 7.2 10*3/uL (ref 4.0–10.5)
nRBC: 0.3 % — ABNORMAL HIGH (ref 0.0–0.2)

## 2023-11-17 LAB — CK: Total CK: 84 U/L (ref 38–234)

## 2023-11-17 LAB — TROPONIN I (HIGH SENSITIVITY): Troponin I (High Sensitivity): 10 ng/L (ref <18)

## 2023-11-17 LAB — LIPASE, BLOOD: Lipase: 70 U/L — ABNORMAL HIGH (ref 11–51)

## 2023-11-17 LAB — MAGNESIUM: Magnesium: 1.4 mg/dL — ABNORMAL LOW (ref 1.7–2.4)

## 2023-11-17 LAB — LACTIC ACID, PLASMA: Lactic Acid, Venous: 1.8 mmol/L (ref 0.5–1.9)

## 2023-11-17 LAB — HCG, SERUM, QUALITATIVE: Preg, Serum: NEGATIVE

## 2023-11-17 MED ORDER — PANTOPRAZOLE SODIUM 40 MG PO TBEC
40.0000 mg | DELAYED_RELEASE_TABLET | Freq: Two times a day (BID) | ORAL | Status: DC
  Administered 2023-11-17 – 2023-11-18 (×2): 40 mg via ORAL
  Filled 2023-11-17 (×2): qty 1

## 2023-11-17 MED ORDER — LACTATED RINGERS IV SOLN: INTRAVENOUS | Status: AC

## 2023-11-17 MED ORDER — LACTATED RINGERS IV BOLUS
2000.0000 mL | Freq: Once | INTRAVENOUS | Status: AC
  Administered 2023-11-17: 2000 mL via INTRAVENOUS

## 2023-11-17 MED ORDER — MORPHINE SULFATE (PF) 4 MG/ML IV SOLN
4.0000 mg | Freq: Once | INTRAVENOUS | Status: AC
  Administered 2023-11-17: 4 mg via INTRAVENOUS
  Filled 2023-11-17: qty 1

## 2023-11-17 MED ORDER — PANTOPRAZOLE SODIUM 40 MG PO TBEC: 40.0000 mg | DELAYED_RELEASE_TABLET | Freq: Every day | ORAL | Status: DC

## 2023-11-17 MED ORDER — ENOXAPARIN SODIUM 30 MG/0.3ML IJ SOSY: 30.0000 mg | PREFILLED_SYRINGE | INTRAMUSCULAR | Status: DC

## 2023-11-17 MED ORDER — METOCLOPRAMIDE HCL 5 MG/ML IJ SOLN
10.0000 mg | Freq: Once | INTRAMUSCULAR | Status: AC
  Administered 2023-11-17: 10 mg via INTRAVENOUS
  Filled 2023-11-17: qty 2

## 2023-11-17 MED ORDER — ACETAMINOPHEN 500 MG PO TABS
1000.0000 mg | ORAL_TABLET | Freq: Four times a day (QID) | ORAL | Status: DC | PRN
  Administered 2023-11-18 (×2): 1000 mg via ORAL
  Filled 2023-11-17 (×2): qty 2

## 2023-11-17 MED ORDER — MAGNESIUM SULFATE 50 % IJ SOLN
2.0000 g | Freq: Once | INTRAMUSCULAR | Status: AC
  Administered 2023-11-17: 2 g via INTRAVENOUS
  Filled 2023-11-17: qty 4

## 2023-11-17 MED ORDER — PROCHLORPERAZINE EDISYLATE 10 MG/2ML IJ SOLN
10.0000 mg | Freq: Four times a day (QID) | INTRAMUSCULAR | Status: DC | PRN
Start: 2023-11-17 — End: 2023-11-18

## 2023-11-17 MED ORDER — SUCRALFATE 1 GM/10ML PO SUSP
1.0000 g | Freq: Three times a day (TID) | ORAL | Status: DC
  Administered 2023-11-17 – 2023-11-18 (×3): 1 g via ORAL
  Filled 2023-11-17 (×3): qty 10

## 2023-11-17 MED ORDER — ALUM & MAG HYDROXIDE-SIMETH 200-200-20 MG/5ML PO SUSP: 30.0000 mL | Freq: Four times a day (QID) | ORAL | Status: DC | PRN

## 2023-11-17 MED ORDER — ONDANSETRON HCL 4 MG/2ML IJ SOLN: 4.0000 mg | Freq: Four times a day (QID) | INTRAMUSCULAR | Status: DC | PRN

## 2023-11-17 MED ORDER — MELATONIN 3 MG PO TABS: 6.0000 mg | ORAL_TABLET | Freq: Every evening | ORAL | Status: DC | PRN

## 2023-11-17 MED ORDER — POTASSIUM CHLORIDE 10 MEQ/100ML IV SOLN
10.0000 meq | INTRAVENOUS | Status: AC
  Administered 2023-11-17 (×2): 10 meq via INTRAVENOUS
  Filled 2023-11-17: qty 100

## 2023-11-17 MED ORDER — POTASSIUM CHLORIDE 10 MEQ/100ML IV SOLN
10.0000 meq | INTRAVENOUS | Status: AC
  Administered 2023-11-17 (×3): 10 meq via INTRAVENOUS
  Filled 2023-11-17: qty 100

## 2023-11-17 MED ORDER — LIDOCAINE VISCOUS HCL 2 % MT SOLN
15.0000 mL | Freq: Three times a day (TID) | OROMUCOSAL | Status: DC | PRN
  Administered 2023-11-18 (×2): 15 mL via OROMUCOSAL
  Filled 2023-11-17 (×2): qty 15

## 2023-11-17 MED ORDER — POTASSIUM CHLORIDE CRYS ER 20 MEQ PO TBCR
40.0000 meq | EXTENDED_RELEASE_TABLET | Freq: Once | ORAL | Status: AC
  Administered 2023-11-17: 40 meq via ORAL
  Filled 2023-11-17: qty 2

## 2023-11-17 NOTE — ED Provider Notes (Signed)
 [place] EMERGENCY DEPARTMENT AT [facility] Provider Note   CSN: [id number] Arrival date & time: 11/17/23  1142     History  Chief Complaint  Patient presents with   Abdominal Pain    Melissa Guzman is a 28 y.o. female.  With history of anemia, hypertension presenting to the ED for evaluation of abdominal pain.  States she has had abdominal pain for the past couple of months.  Pain is generalized but worse on the left side.  She reports she is scheduled to have her gallbladder taken out sometime in the future.  She states for the past 3 days she has been vomiting nonstop and has not been able to keep anything down.  Her urinary output has decreased as well.  She denies any fevers or chills but reports some hot flashes.  Normal bowel movements.  She is passing gas.  She denies melena, hematochezia or hematemesis.  She reports a history of alcohol use but denies recent alcohol.  She reports marijuana use every other day.  She denies cigarette use.  No other recreational drug use.  She reports some chest pain after episodes of emesis as well.   Abdominal Pain Associated symptoms: nausea and vomiting        Home Medications Prior to Admission medications   Medication Sig Start Date End Date Taking? Authorizing Provider  acetaminophen (TYLENOL) 500 MG tablet Take 2 tablets (1,000 mg total) by mouth every 8 (eight) hours as needed (pain). 03/05/22  Yes Worthy Rancher, MD  amLODipine (NORVASC) 5 MG tablet Take 1 tablet (5 mg total) by mouth daily. 05/29/23 12/05/23 Yes Ghimire, Lyndel Safe, MD  ondansetron (ZOFRAN-ODT) 4 MG disintegrating tablet Take 1 tablet (4 mg total) by mouth every 8 (eight) hours as needed. 10/04/23  Yes Henderly, Britni A, PA-C  pantoprazole (PROTONIX) 40 MG tablet Take 40 mg by mouth daily. 08/29/23  Yes [provider]  potassium chloride SA (KLOR-CON M) 20 MEQ tablet Take 1 tablet (20 mEq total) by mouth daily for 4 days. 10/04/23 10/08/23   Henderly, Britni A, PA-C      Allergies    Patient has no known allergies.    Review of Systems   Review of Systems  Gastrointestinal:  Positive for abdominal pain, nausea and vomiting.  All other systems reviewed and are negative.   Physical Exam Updated Vital Signs BP 117/81   Pulse 81   Temp 98.9 F (37.2 C)   Resp 17   Ht 5\' 1"  (1.549 m)   Wt 58.1 kg   SpO2 96%   BMI 24.19 kg/m  Physical Exam Vitals and nursing note reviewed.  Constitutional:      General: She is not in acute distress.    Appearance: Normal appearance. She is normal weight. She is not ill-appearing.  HENT:     Head: Normocephalic and atraumatic.  Cardiovascular:     Rate and Rhythm: Regular rhythm. Tachycardia present.  Pulmonary:     Effort: Pulmonary effort is normal. No respiratory distress.  Abdominal:     General: Abdomen is flat. Bowel sounds are normal.     Palpations: Abdomen is soft.     Tenderness: There is abdominal tenderness in the epigastric area. There is no guarding or rebound.  Musculoskeletal:        General: Normal range of motion.     Cervical back: Neck supple.  Skin:    General: Skin is warm and dry.  Neurological:  Mental Status: She is alert and oriented to person, place, and time.  Psychiatric:        Mood and Affect: Mood normal.        Behavior: Behavior normal.     ED Results / Procedures / Treatments   Labs (all labs ordered are listed, but only abnormal results are displayed) Labs Reviewed  LIPASE, BLOOD - Abnormal; Notable for the following components:      Result Value   Lipase 70 (*)    All other components within normal limits  COMPREHENSIVE METABOLIC PANEL - Abnormal; Notable for the following components:   Sodium 134 (*)    Potassium 2.7 (*)    Chloride 90 (*)    Glucose, Bld 142 (*)    Creatinine, Ser 2.40 (*)    Calcium 10.7 (*)    Total Protein 10.0 (*)    Albumin 5.1 (*)    AST 61 (*)    GFR, Estimated 28 (*)    Anion gap 19 (*)     All other components within normal limits  CBC - Abnormal; Notable for the following components:   nRBC 0.3 (*)    All other components within normal limits  MAGNESIUM - Abnormal; Notable for the following components:   Magnesium 1.4 (*)    All other components within normal limits  URINALYSIS, W/ REFLEX TO CULTURE (INFECTION SUSPECTED) - Abnormal; Notable for the following components:   APPearance HAZY (*)    Hgb urine dipstick TRACE (*)    Ketones, ur 15 (*)    Protein, ur 30 (*)    All other components within normal limits  I-STAT VENOUS BLOOD GAS, ED - Abnormal; Notable for the following components:   Bicarbonate 30.2 (*)    Acid-Base Excess 5.0 (*)    Sodium 133 (*)    HCT 35.0 (*)    Hemoglobin 11.9 (*)    All other components within normal limits  HCG, SERUM, QUALITATIVE  LACTIC ACID, PLASMA  LACTIC ACID, PLASMA    EKG EKG Interpretation Date/Time:  Tuesday November 17 2023 13:37:52 EST Ventricular Rate:  91 PR Interval:  124 QRS Duration:  86 QT Interval:  389 QTC Calculation: 479 R Axis:   84  Text Interpretation: Sinus rhythm Borderline repolarization abnormality Borderline prolonged QT interval Confirmed by Vonita Moss [phone]) on 11/17/2023 1:48:17 PM  Radiology CT ABDOMEN PELVIS WO CONTRAST Result Date: 11/17/2023 CLINICAL DATA:  Left lower quadrant pain EXAM: CT ABDOMEN AND PELVIS WITHOUT CONTRAST TECHNIQUE: Multidetector CT imaging of the abdomen and pelvis was performed following the standard protocol without IV contrast. RADIATION DOSE REDUCTION: This exam was performed according to the departmental dose-optimization program which includes automated exposure control, adjustment of the mA and/or kV according to patient size and/or use of iterative reconstruction technique. COMPARISON:  CT 10/04/2023, 05/26/2023 FINDINGS: Lower chest: No acute abnormality. Hepatobiliary: Hepatic steatosis. More geographic focal fat infiltration near the falciform ligament and  within the inferior right hepatic lobe. Small gallstones. No biliary dilatation Pancreas: Unremarkable. No pancreatic ductal dilatation or surrounding inflammatory changes. Spleen: Normal in size without focal abnormality. Adrenals/Urinary Tract: Adrenal glands are unremarkable. Kidneys are normal, without renal calculi, focal lesion, or hydronephrosis. Bladder is unremarkable. Stomach/Bowel: Stomach is within normal limits. Appendix appears normal. No evidence of bowel wall thickening, distention, or inflammatory changes. Vascular/Lymphatic: No significant vascular findings are present. No enlarged abdominal or pelvic lymph nodes. Reproductive: Uterus unremarkable. Prominent ovaries with follicles, no suspicious adnexal mass. Other: No abdominal wall  hernia or abnormality. No abdominopelvic ascites. Musculoskeletal: Chronic bilateral pars defect at L5 IMPRESSION: 1. No CT evidence for acute intra-abdominal or pelvic abnormality. 2. Hepatic steatosis. 3. Cholelithiasis. Electronically Signed   By: Jasmine Pang M.D.   On: 11/17/2023 16:34    Procedures .Critical Care  Performed by: Michelle Piper, PA-C Authorized by: Michelle Piper, PA-C   Critical care provider statement:    Critical care time (minutes):  51   Critical care was necessary to treat or prevent imminent or life-threatening deterioration of the following conditions:  Renal failure, metabolic crisis and dehydration   Critical care was time spent personally by me on the following activities:  Development of treatment plan with patient or surrogate, discussions with consultants, evaluation of patient's response to treatment, examination of patient, ordering and review of laboratory studies, ordering and review of radiographic studies, ordering and performing treatments and interventions, pulse oximetry, re-evaluation of patient's condition and review of old charts   Care discussed with: admitting provider   Comments:     Significant  AKI and significant electrolyte derangement with an anion gap of 19     Medications Ordered in ED Medications  potassium chloride 10 mEq in 100 mL IVPB (10 mEq Intravenous New Bag/Given 11/17/23 1713)  lactated ringers bolus 2,000 mL (0 mLs Intravenous Stopped 11/17/23 1638)  potassium chloride 10 mEq in 100 mL IVPB (0 mEq Intravenous Stopped 11/17/23 1638)  morphine (PF) 4 MG/ML injection 4 mg (4 mg Intravenous Given 11/17/23 1317)  metoCLOPramide (REGLAN) injection 10 mg (10 mg Intravenous Given 11/17/23 1318)  magnesium sulfate (IV Push/IM) injection 2 g (2 g Intravenous Given 11/17/23 1404)  magnesium sulfate (IV Push/IM) injection 2 g (2 g Intravenous Given 11/17/23 1716)    ED Course/ Medical Decision Making/ A&P                                 Medical Decision Making Amount and/or Complexity of Data Reviewed Labs: ordered.  This patient presents to the ED for concern of nausea, vomiting, abdominal pain, this involves an extensive number of treatment options, and is a complaint that carries with it a high risk of complications and morbidity.  The differential diagnosis for generalized abdominal pain includes, but is not limited to AAA, gastroenteritis, appendicitis, Bowel obstruction, Bowel perforation. Gastroparesis, DKA, Hernia, Inflammatory bowel disease, mesenteric ischemia, pancreatitis, peritonitis SBP, volvulus.   My initial workup includes  Additional history obtained from: Nursing notes from this visit. Previous records within EMR system diagnostic laparoscopy on 10/09/2023 with Dr. Hillery Hunter, significant adhesions across the transverse colon noted EMS provides a portion of the history  I ordered, reviewed and interpreted labs which include: CMP, CBC, lipase, magnesium.  Hyponatremia 134, hypokalemia 2.7, hypochloremia of 90, hyperglycemia 142, creatinine significant elevated to 2.4 with a baseline of 0.6, GFR of 28 with an anion gap of 19.  No leukocytosis or anemia.  Lipase  mildly elevated to 70 significantly improved from previous labs.  I ordered imaging studies including CT abdomen pelvis I independently visualized and interpreted imaging which showed negative I agree with the radiologist interpretation  Cardiac Monitoring:  The patient was maintained on a cardiac monitor.  I personally viewed and interpreted the cardiac monitored which showed an underlying rhythm of: Sinus tachycardia  Consultations Obtained:  I requested consultation with the hospitalist Dr. Nelson Chimes,  and discussed lab and imaging findings as well as pertinent plan -  they recommend: Admission  Afebrile, initially significantly tachycardic but otherwise hemodynamically stable.  28 year old female presenting to the ED for evaluation of generalized abdominal pain, nausea and vomiting.  Abdominal pain has been fairly persistent for the past month or so.  Nausea and vomiting significantly worsened 3 days ago.  Overall suspicious for cannabis hyperemesis given that she smokes marijuana at least every other day.  She has some mild tenderness to palpation on exam but abdomen is soft, no guarding well-hydrated.  Lab workup concerning for significant AKI with a creatinine of 2.4 with a baseline of 0.6.  Significant hypokalemia of 2.7 and hypomagnesemia of 1.4 likely secondary to GI loss.  Anion gap of 19 may be secondary to dehydration or lactic acidosis.  CT abdomen pelvis reassuring.  Patient reported significant improvement in her symptoms after treatment in the emergency department.  Given her severity of AKI, believe patient would benefit from inpatient admission for continued fluid resuscitation and electrolyte replacement.  Discussed this plan with the patient who is in agreement.  Stable at the time of admission.  Patient's case discussed with Dr. Eloise Harman who agrees with plan to admit.  Note: Portions of this report may have been transcribed using voice recognition software. Every effort was made to  ensure accuracy; however, inadvertent computerized transcription errors may still be present.        Final Clinical Impression(s) / ED Diagnoses Final diagnoses:  Acute kidney injury (HCC)  Nausea and vomiting, unspecified vomiting type  Generalized abdominal pain    Rx / DC Orders ED Discharge Orders     None         Mora Bellman 11/17/23 1751    Rondel Baton, MD 11/17/23 2025

## 2023-11-17 NOTE — H&P (Addendum)
 History and Physical    Danine Hor Picco ZOX:[id number] DOB: 06/19/1996 DOA: 11/17/2023  PCP: Venora Maples, MD   Patient coming from: Transfer from [facility] ED   Chief Complaint:  Chief Complaint  Patient presents with   Abdominal Pain    HPI:  Melissa Guzman is a 28 y.o. female with hx of recent gallstone pancreatitis with attempted laparoscopic cholecystectomy aborted due to adhesions and scheduled outpatient 2/21, hypertension, GERD, who is transferred from [facility] ED due to AKI associated with abdominal pain, nausea and vomiting.  Reports that she has been dealing with upper abdominal pain which radiates to the back between her shoulders since her previous hospitalization which has not fully resolved.  Does not notice significant changes with p.o. intake although within the past week has been unable to tolerate significant p.o.'s.  In the past 3 days developing nausea and vomiting.  No hematemesis/coffee-ground emesis.  Had 1 episode of diarrhea yesterday but otherwise none.  Had some associated chills.  No other recent illness.   Review of Systems:  ROS complete and negative except as marked above   No Known Allergies  Prior to Admission medications   Medication Sig Start Date End Date Taking? Authorizing Provider  acetaminophen (TYLENOL) 500 MG tablet Take 2 tablets (1,000 mg total) by mouth every 8 (eight) hours as needed (pain). 03/05/22  Yes Worthy Rancher, MD  amLODipine (NORVASC) 5 MG tablet Take 1 tablet (5 mg total) by mouth daily. 05/29/23 12/05/23 Yes Ghimire, Lyndel Safe, MD  ondansetron (ZOFRAN-ODT) 4 MG disintegrating tablet Take 1 tablet (4 mg total) by mouth every 8 (eight) hours as needed. 10/04/23  Yes Henderly, Britni A, PA-C  pantoprazole (PROTONIX) 40 MG tablet Take 40 mg by mouth daily. 08/29/23  Yes [provider]  potassium chloride SA (KLOR-CON M) 20 MEQ tablet Take 1 tablet (20 mEq total) by mouth daily for 4 days. 10/04/23 10/08/23   Henderly, Britni A, PA-C    Past Medical History:  Diagnosis Date   Acute blood loss anemia 03/01/2022   Anemia    COVID-19 affecting pregnancy in third trimester 02/04/2022   +01/28/2022   Endometritis 03/05/2022   Hypertension    Postpartum septic endometritis 03/09/2022   UTI (urinary tract infection) 05/26/2023    Past Surgical History:  Procedure Laterality Date   CESAREAN SECTION N/A 03/01/2022   Procedure: CESAREAN SECTION;  Surgeon: Hermina Staggers, MD;  Location: [facility] LD ORS;  Service: Obstetrics;  Laterality: N/A;   DIAGNOSTIC LAPAROSCOPIC LIVER BIOPSY N/A 10/09/2023   Procedure: DIAGNOSTIC LAPAROSCOPY;  Surgeon: Moise Boring, MD;  Location: [facility] OR;  Service: General;  Laterality: N/A;   IR RADIOLOGIST EVAL & MGMT  03/28/2022   IR RADIOLOGIST EVAL & MGMT  04/10/2022     reports that she has never smoked. She has never used smokeless tobacco. She reports current alcohol use. She reports that she does not use drugs.  History reviewed. No pertinent family history.   Physical Exam: Vitals:   11/17/23 1430 11/17/23 1515 11/17/23 1638 11/17/23 1935  BP: 104/75 117/81  124/89  Pulse: 88 81  87  Resp: 15 17  18   Temp:   98.9 F (37.2 C) 98.1 F (36.7 C)  TempSrc:    Oral  SpO2: 99% 96%  97%  Weight:      Height:        Gen: Awake, alert, NAD   CV: Regular, normal S1, S2, no murmurs  Resp: Normal WOB,  CTAB  Abd: Flat, normoactive, mild epigastric tenderness, negative Murphy's. MSK: Symmetric, no edema  Skin: No rashes or lesions to exposed skin  Neuro: Alert and interactive  Psych: euthymic, appropriate    Data review:   Labs reviewed, notable for:   Lactate 1.8 K2.7, bicarb 25, anion gap 19, mag 1.4, calcium 10.7 Creatinine 2.4, baseline 0.5 Lipase 70, down from prior AST 61, other LFT normal   Micro:  Results for orders placed or performed during the hospital encounter of 10/06/23  Urine Culture (for pregnant, neutropenic or urologic patients or  patients with an indwelling urinary catheter)     Status: None   Collection Time: 10/06/23  3:07 PM   Specimen: Urine, Clean Catch  Result Value Ref Range Status   Specimen Description URINE, CLEAN CATCH  Final   Special Requests NONE  Final   Culture   Final    NO GROWTH Performed at [facility], [address] [place], [place] [address]    Report Status 10/07/2023 FINAL  Final    Imaging reviewed:  CT ABDOMEN PELVIS WO CONTRAST Result Date: 11/17/2023 CLINICAL DATA:  Left lower quadrant pain EXAM: CT ABDOMEN AND PELVIS WITHOUT CONTRAST TECHNIQUE: Multidetector CT imaging of the abdomen and pelvis was performed following the standard protocol without IV contrast. RADIATION DOSE REDUCTION: This exam was performed according to the departmental dose-optimization program which includes automated exposure control, adjustment of the mA and/or kV according to patient size and/or use of iterative reconstruction technique. COMPARISON:  CT 10/04/2023, 05/26/2023 FINDINGS: Lower chest: No acute abnormality. Hepatobiliary: Hepatic steatosis. More geographic focal fat infiltration near the falciform ligament and within the inferior right hepatic lobe. Small gallstones. No biliary dilatation Pancreas: Unremarkable. No pancreatic ductal dilatation or surrounding inflammatory changes. Spleen: Normal in size without focal abnormality. Adrenals/Urinary Tract: Adrenal glands are unremarkable. Kidneys are normal, without renal calculi, focal lesion, or hydronephrosis. Bladder is unremarkable. Stomach/Bowel: Stomach is within normal limits. Appendix appears normal. No evidence of bowel wall thickening, distention, or inflammatory changes. Vascular/Lymphatic: No significant vascular findings are present. No enlarged abdominal or pelvic lymph nodes. Reproductive: Uterus unremarkable. Prominent ovaries with follicles, no suspicious adnexal mass. Other: No abdominal wall hernia or abnormality. No abdominopelvic  ascites. Musculoskeletal: Chronic bilateral pars defect at L5 IMPRESSION: 1. No CT evidence for acute intra-abdominal or pelvic abnormality. 2. Hepatic steatosis. 3. Cholelithiasis. Electronically Signed   By: Jasmine Pang M.D.   On: 11/17/2023 16:34   EKG personally reviewed Abnormal, sinus rhythm, diffuse ST depression and T wave inversion, aVR elevation  ED Course:  Treated with 2 L IV fluid, potassium and magnesium repletion, morphine, Reglan.  Transferred from [facility] ED due to AKI   Assessment/Plan:  28 y.o. female with hx recent gallstone pancreatitis with attempted laparoscopic cholecystectomy aborted due to adhesions and scheduled outpatient 2/21, hypertension, GERD, who is transferred from [facility] ED due to AKI associated with abdominal pain, nausea and vomiting.   Acute kidney injury stage III Baseline creatinine 0.5, elevated to 2.4.  Prerenal in the setting of GI losses -S/p 2 L IV fluid, started on MIVF LR at 75 cc an hour overnight to supplement oral intake.  Advised on increasing oral hydration. -Repeat BMP in a.m.; if renal function is improving and she is able to tolerate p.o.'s can continue oral hydration and should be able to discharge in the afternoon.  Abdominal pain, nausea/vomiting Reports ongoing upper abdominal pain radiating to her back since her recent episode of gallstone pancreatitis.  CT abdomen pelvis without contrast with no acute abnormalities noted, cholelithiasis without signs of cholecystitis or biliary dilation, no changes of pancreatitis; incidental finding of hepatic steatosis.  Suspect that she has multiple reasons for abdominal pain including possible resolving pain from prior pancreatitis, biliary colic, and likely gastritis; these are more likely than cannabis hyperemesis had reported use at outside hospital but denies on my interview. Nausea and vomiting has been better controlled with antiemetics in the hospital. - Supportive care with IV fluids per  above - Symptomatic management Zofran, Compazine as needed for nausea/vomiting. - For possible gastritis increase pantoprazole to 40 mg twice daily x 8 weeks then resume once daily.  Trial sucralfate 1 g 4 times daily, GI cocktail/viscous lidocaine as needed - Although cannabis hyperemesis less likely, would benefit from cutting down on use especially during her acute illness. - Ideally should follow-up and have surgery outpatient as scheduled 2/21.  No indication for additional imaging at this time.  Abnormal EKG EKG with diffuse ST depression and T wave inversion, aVR elevation concerning for possible ischemic change in the setting of volume depletion.  Abnormal considering her age and the low likelihood of any coronary disease.  -Check troponin, repeat EKG  Chronic medical problems: Hypertension: Hold her home amlodipine, would temporarily stop outpatient unless becomes more hypertensive GERD: See above increase PPI   Body mass index is 24.19 kg/m.    DVT prophylaxis:   None, mobile and short expected hospitalization Code Status:  Full Code Diet:  Diet Orders (From admission, onward)     Start     Ordered   11/17/23 2223  Diet regular Room service appropriate? Yes; Fluid consistency: Thin  Diet effective now       Question Answer Comment  Room service appropriate? Yes   Fluid consistency: Thin      11/17/23 2224           Family Communication:  No; discussed with boyfriend at bedside with her permission   Consults:  None   Admission status:   Inpatient, Med-Surg  Severity of Illness: The appropriate patient status for this patient is INPATIENT. Inpatient status is judged to be reasonable and necessary in order to provide the required intensity of service to ensure the patient's safety. The patient's presenting symptoms, physical exam findings, and initial radiographic and laboratory data in the context of their chronic comorbidities is felt to place them at high risk for  further clinical deterioration. Furthermore, it is not anticipated that the patient will be medically stable for discharge from the hospital within 2 midnights of admission.   * I certify that at the point of admission it is my clinical judgment that the patient will require inpatient hospital care spanning beyond 2 midnights from the point of admission due to high intensity of service, high risk for further deterioration and high frequency of surveillance required.*   Dolly Rias, MD [facility]  How to contact the [facility] Attending or Consulting provider 7A - 7P or covering provider during after hours 7P -7A, for this patient.  Check the care team in [facility] and look for a) attending/consulting [facility] provider listed and b) the [facility] team listed Log into [email or web address] and use [facility] universal password to access. If you do not have the password, please contact the hospital operator. Locate the [facility] provider you are looking for under [facility] and page to a number that you can be directly reached. If you still have difficulty reaching the provider, please  page the [facility] (Director on Call) for the Hospitalists listed on amion for assistance.  11/17/2023, 10:48 PM

## 2023-11-17 NOTE — ED Triage Notes (Signed)
 BIB EMS. C/o left sided abd pain since January 10th. Supposed to have gallbladder removed on Friday. Reports pain got worse today, EMS reports patient was tachy en route.

## 2023-11-17 NOTE — ED Notes (Signed)
 Kim with cl called for transport

## 2023-11-17 NOTE — Progress Notes (Addendum)
  28 year old with history of HTN, marijuana use comes to the hospital with abdominal pain.  About a month ago diagnosed with gallstone pancreatitis.  Laparoscopic cholecystectomy was planned but it was aborted due to significant adhesions with plans to perform this in outpatient setting. Outpatient returns back again with abdominal pain, nausea and vomiting.  In the ER lab work showing acute kidney injury with creatinine of 2.4 ( baseline 0.5 ), hypokalemia, hypomagnesemia, mildly elevated LFTs and lipase.  EDP has started potassium and magnesium repletion along with a bolus.  At this time CT read is still pending.  I requested to check lactate and VBG given elevated anion gap.  Pending these results, will determine patient's admission level of care and location.  EDP will notify once resulted.  [facility] aware.  Will hold off on placing any admission orders at this time.   ADDENDUM 540PM: CT scan and VBG reviewed. Will place admit orders for Med Surg [facility] and EDP notified.   Plz notify [facility] once ptn arrives.   Stephania Fragmin MD [facility]

## 2023-11-17 NOTE — ED Notes (Signed)
 Called [facility] IP floor. Report not received due to shift change- callback number left with floor- aware [facility] is on the way.

## 2023-11-17 NOTE — Plan of Care (Signed)
   Problem: Education: Goal: Knowledge of General Education information will improve Description: Including pain rating scale, medication(s)/side effects and non-pharmacologic comfort measures Outcome: Progressing   Problem: Clinical Measurements: Goal: Will remain free from infection Outcome: Progressing   Problem: Coping: Goal: Level of anxiety will decrease Outcome: Progressing

## 2023-11-18 ENCOUNTER — Other Ambulatory Visit (HOSPITAL_COMMUNITY): Payer: Self-pay

## 2023-11-18 DIAGNOSIS — N179 Acute kidney failure, unspecified: Secondary | ICD-10-CM | POA: Diagnosis not present

## 2023-11-18 LAB — HEPATIC FUNCTION PANEL
ALT: 22 U/L (ref 0–44)
AST: 43 U/L — ABNORMAL HIGH (ref 15–41)
Albumin: 3.8 g/dL (ref 3.5–5.0)
Alkaline Phosphatase: 54 U/L (ref 38–126)
Bilirubin, Direct: 0.1 mg/dL (ref 0.0–0.2)
Indirect Bilirubin: 0.9 mg/dL (ref 0.3–0.9)
Total Bilirubin: 1 mg/dL (ref 0.0–1.2)
Total Protein: 7.1 g/dL (ref 6.5–8.1)

## 2023-11-18 LAB — BASIC METABOLIC PANEL
Anion gap: 11 (ref 5–15)
BUN: 8 mg/dL (ref 6–20)
CO2: 28 mmol/L (ref 22–32)
Calcium: 9.3 mg/dL (ref 8.9–10.3)
Chloride: 99 mmol/L (ref 98–111)
Creatinine, Ser: 0.78 mg/dL (ref 0.44–1.00)
GFR, Estimated: >60 mL/min (ref >60)
Glucose, Bld: 100 mg/dL — ABNORMAL HIGH (ref 70–99)
Potassium: 3.1 mmol/L — ABNORMAL LOW (ref 3.5–5.1)
Sodium: 138 mmol/L (ref 135–145)

## 2023-11-18 LAB — MAGNESIUM: Magnesium: 2.1 mg/dL (ref 1.7–2.4)

## 2023-11-18 LAB — PHOSPHORUS: Phosphorus: 2.7 mg/dL (ref 2.5–4.6)

## 2023-11-18 MED ORDER — PANTOPRAZOLE SODIUM 40 MG PO TBEC
40.0000 mg | DELAYED_RELEASE_TABLET | Freq: Every day | ORAL | 0 refills | Status: DC
  Filled 2023-11-18: qty 30, 30d supply, fill #0

## 2023-11-18 MED ORDER — ONDANSETRON 4 MG PO TBDP
4.0000 mg | ORAL_TABLET | Freq: Three times a day (TID) | ORAL | 0 refills | Status: DC | PRN
  Filled 2023-11-18: qty 20, 7d supply, fill #0

## 2023-11-18 MED ORDER — POTASSIUM CHLORIDE CRYS ER 20 MEQ PO TBCR
40.0000 meq | EXTENDED_RELEASE_TABLET | Freq: Every day | ORAL | 0 refills | Status: DC
  Filled 2023-11-18: qty 10, 5d supply, fill #0

## 2023-11-18 MED ORDER — POTASSIUM CHLORIDE CRYS ER 20 MEQ PO TBCR
60.0000 meq | EXTENDED_RELEASE_TABLET | Freq: Once | ORAL | Status: AC
  Administered 2023-11-18: 60 meq via ORAL
  Filled 2023-11-18: qty 3

## 2023-11-18 NOTE — Discharge Summary (Addendum)
 Physician Discharge Summary  Melissa Guzman WGN:[id number] DOB: [birth year] DOA: 11/17/2023  PCP: Venora Maples, MD  Admit date: 11/17/2023 Discharge date: 11/18/2023  Admitted From: Home Disposition:  Home  Discharge Condition:Stable CODE STATUS:FULL Diet recommendation: Heart Healthy   Brief/Interim Summary: Melissa Guzman is a 28 y.o. female with hx of recent gallstone pancreatitis with attempted laparoscopic cholecystectomy aborted due to adhesions and scheduled outpatient 2/21, hypertension, GERD, who was transferred from [facility] ED due to AKI associated with abdominal pain, nausea and vomiting.  She reported that she has been dealing with upper abdominal pain which radiates to the back between her shoulders since her previous hospitalization which has not fully resolved.  Does not notice significant changes with p.o. intake although within the past week has been unable to tolerate significant oral intake. On presentation, lab work showed creatinine in the range of 2.4.  On IV fluids.  CT abdomen/pelvis did not show any acute findings.  This morning her kidney function is normal but of abdomen, nausea or vomiting today.  Tolerating soft diet today.  Medically stable for discharge.  She will follow-up with general surgery as an outpatient for planned lap chole.  Following problems were addressed during the hospitalization:  Acute kidney injury stage III Baseline creatinine 0.5, elevated to 2.4.  Prerenal in the setting of GI losses Normalized with IV fluids  Abdominal pain, nausea/vomiting Reports ongoing upper abdominal pain radiating to her back since her recent episode of gallstone pancreatitis.  CT abdomen pelvis without contrast with no acute abnormalities noted, cholelithiasis without signs of cholecystitis or biliary dilation, no changes of pancreatitis; incidental finding of hepatic steatosis.  Suspect that she has multiple reasons for abdominal pain including  possible resolving pain from prior pancreatitis, biliary colic, and likely gastritis; these are more likely than cannabis hyperemesis had reported use at outside hospital but denies on my interview. Continue PPI, antiemetics on discharge. She has  appointment with surgery as an outpatient as scheduled 2/21.  No indication for additional imaging at this time.   Hypertension: Takes amlodipine  GERD: Continue PPI   Discharge Diagnoses:  Principal Problem:   AKI (acute kidney injury) (HCC) Active Problems:   Gastritis   Biliary colic    Discharge Instructions  Discharge Instructions     Diet - low sodium heart healthy   Complete by: As directed    Discharge instructions   Complete by: As directed    1)Please follow up with your PCP and general surgery as an outpatient   Increase activity slowly   Complete by: As directed       Allergies as of 11/18/2023   No Known Allergies      Medication List     TAKE these medications    Acetaminophen Extra Strength 500 MG Tabs Take 2 tablets (1,000 mg total) by mouth every 8 (eight) hours as needed (pain).   amLODipine 5 MG tablet Commonly known as: NORVASC Take 1 tablet (5 mg total) by mouth daily.   ondansetron 4 MG disintegrating tablet Commonly known as: ZOFRAN-ODT Take 1 tablet (4 mg total) by mouth every 8 (eight) hours as needed.   pantoprazole 40 MG tablet Commonly known as: PROTONIX Take 1 tablet (40 mg total) by mouth daily.   potassium chloride SA 20 MEQ tablet Commonly known as: KLOR-CON M Take 2 tablets (40 mEq total) by mouth daily for 5 days. Start taking on: November 19, 2023 What changed: how much to take  No Known Allergies  Consultations: None   Procedures/Studies: CT ABDOMEN PELVIS WO CONTRAST Result Date: 11/17/2023 CLINICAL DATA:  Left lower quadrant pain EXAM: CT ABDOMEN AND PELVIS WITHOUT CONTRAST TECHNIQUE: Multidetector CT imaging of the abdomen and pelvis was performed following  the standard protocol without IV contrast. RADIATION DOSE REDUCTION: This exam was performed according to the departmental dose-optimization program which includes automated exposure control, adjustment of the mA and/or kV according to patient size and/or use of iterative reconstruction technique. COMPARISON:  CT 10/04/2023, 05/26/2023 FINDINGS: Lower chest: No acute abnormality. Hepatobiliary: Hepatic steatosis. More geographic focal fat infiltration near the falciform ligament and within the inferior right hepatic lobe. Small gallstones. No biliary dilatation Pancreas: Unremarkable. No pancreatic ductal dilatation or surrounding inflammatory changes. Spleen: Normal in size without focal abnormality. Adrenals/Urinary Tract: Adrenal glands are unremarkable. Kidneys are normal, without renal calculi, focal lesion, or hydronephrosis. Bladder is unremarkable. Stomach/Bowel: Stomach is within normal limits. Appendix appears normal. No evidence of bowel wall thickening, distention, or inflammatory changes. Vascular/Lymphatic: No significant vascular findings are present. No enlarged abdominal or pelvic lymph nodes. Reproductive: Uterus unremarkable. Prominent ovaries with follicles, no suspicious adnexal mass. Other: No abdominal wall hernia or abnormality. No abdominopelvic ascites. Musculoskeletal: Chronic bilateral pars defect at L5 IMPRESSION: 1. No CT evidence for acute intra-abdominal or pelvic abnormality. 2. Hepatic steatosis. 3. Cholelithiasis. Electronically Signed   By: Jasmine Pang M.D.   On: 11/17/2023 16:34      Subjective: Patient seen and examined at bedside today.  Hemodynamically stable for discharge.  Comfortable.  No abdominal pain, nausea or vomiting.  Abdomen  is soft, nontender, nondistended.  Tolerating soft diet  Discharge Exam: Vitals:   11/18/23 0500 11/18/23 1320  BP: 112/82 (!) 143/102  Pulse: 82 93  Resp: 16 20  Temp: 98.5 F (36.9 C) 98.1 F (36.7 C)  SpO2: 98% 100%    Vitals:   11/17/23 1935 11/17/23 2330 11/18/23 0500 11/18/23 1320  BP: 124/89 117/82 112/82 (!) 143/102  Pulse: 87 79 82 93  Resp: 18 14 16 20   Temp: 98.1 F (36.7 C) 98.1 F (36.7 C) 98.5 F (36.9 C) 98.1 F (36.7 C)  TempSrc: Oral Oral Oral Oral  SpO2: 97% 99% 98% 100%  Weight:      Height:        General: Pt is alert, awake, not in acute distress Cardiovascular: RRR, S1/S2 +, no rubs, no gallops Respiratory: CTA bilaterally, no wheezing, no rhonchi Abdominal: Soft, NT, ND, bowel sounds + Extremities: no edema, no cyanosis    The results of significant diagnostics from this hospitalization (including imaging, microbiology, ancillary and laboratory) are listed below for reference.     Microbiology: No results found for this or any previous visit (from the past 240 hours).   Labs: BNP (last 3 results) No results for input(s): "BNP" in the last 8760 hours. Basic Metabolic Panel: Recent Labs  Lab 11/17/23 1152 11/17/23 1637 11/18/23 0612  NA 134* 133* 138  K 2.7* 3.8 3.1*  CL 90*  --  99  CO2 25  --  28  GLUCOSE 142*  --  100*  BUN 13  --  8  CREATININE 2.40*  --  0.78  CALCIUM 10.7*  --  9.3  MG 1.4*  --  2.1  PHOS  --   --  2.7   Liver Function Tests: Recent Labs  Lab 11/17/23 1152 11/18/23 0612  AST 61* 43*  ALT 27 22  ALKPHOS 87 54  BILITOT 1.0 1.0  PROT 10.0* 7.1  ALBUMIN 5.1* 3.8   Recent Labs  Lab 11/17/23 1152  LIPASE 70*   No results for input(s): "AMMONIA" in the last 168 hours. CBC: Recent Labs  Lab 11/17/23 1152 11/17/23 1637  WBC 7.2  --   HGB 12.7 11.9*  HCT 37.9 35.0*  MCV 92.4  --   PLT 189  --    Cardiac Enzymes: Recent Labs  Lab 11/17/23 2250  CKTOTAL 84   BNP: Invalid input(s): "POCBNP" CBG: No results for input(s): "GLUCAP" in the last 168 hours. D-Dimer No results for input(s): "DDIMER" in the last 72 hours. Hgb A1c No results for input(s): "HGBA1C" in the last 72 hours. Lipid Profile No results for  input(s): "CHOL", "HDL", "LDLCALC", "TRIG", "CHOLHDL", "LDLDIRECT" in the last 72 hours. Thyroid function studies No results for input(s): "TSH", "T4TOTAL", "T3FREE", "THYROIDAB" in the last 72 hours.  Invalid input(s): "FREET3" Anemia work up No results for input(s): "VITAMINB12", "FOLATE", "FERRITIN", "TIBC", "IRON", "RETICCTPCT" in the last 72 hours. Urinalysis    Component Value Date/Time   COLORURINE YELLOW 11/17/2023 1647   APPEARANCEUR HAZY (A) 11/17/2023 1647   LABSPEC 1.015 11/17/2023 1647   PHURINE 6.0 11/17/2023 1647   GLUCOSEU NEGATIVE 11/17/2023 1647   HGBUR TRACE (A) 11/17/2023 1647   BILIRUBINUR NEGATIVE 11/17/2023 1647   KETONESUR 15 (A) 11/17/2023 1647   PROTEINUR 30 (A) 11/17/2023 1647   UROBILINOGEN 0.2 08/19/2023 1540   NITRITE NEGATIVE 11/17/2023 1647   LEUKOCYTESUR NEGATIVE 11/17/2023 1647   Sepsis Labs Recent Labs  Lab 11/17/23 1152  WBC 7.2   Microbiology No results found for this or any previous visit (from the past 240 hours).  Please note: You were cared for by a hospitalist during your hospital stay. Once you are discharged, your primary care physician will handle any further medical issues. Please note that NO REFILLS for any discharge medications will be authorized once you are discharged, as it is imperative that you return to your primary care physician (or establish a relationship with a primary care physician if you do not have one) for your post hospital discharge needs so that they can reassess your need for medications and monitor your lab values.    Time coordinating discharge: 40 minutes  SIGNED:   Burnadette Pop, MD  [facility] 11/18/2023, 1:44 PM Pager [phone]  If 7PM-7AM, please contact night-coverage [email or web address] Password [email or web address]

## 2023-11-18 NOTE — Progress Notes (Signed)
   11/18/23 1353  TOC Brief Assessment  Insurance and Status Reviewed  Patient has primary care physician Yes Crissie Reese, Mary Sella, MD)  Home environment has been reviewed Yes (with family)  Prior level of function: Independent  Prior/Current Home Services No current home services  Social Drivers of Health Review SDOH reviewed no interventions necessary  Readmission risk has been reviewed Yes  Transition of care needs no transition of care needs at this time

## 2023-11-18 NOTE — TOC Transition Note (Signed)
 Transition of Care [facility]) - Discharge Note   Patient Details  Name: Melissa Guzman MRN: [id number] Date of Birth: 1996-05-02  Transition of Care [facility]) CM/SW Contact:  Beckie Busing, RN Phone Number:[phone]  11/18/2023, 2:01 PM   Clinical Narrative:    Patient with discharge orders. No TOC needs noted.    Final next level of care: Home/Self Care Barriers to Discharge: No Barriers Identified   Patient Goals and CMS Choice   CMS Medicare.gov Compare Post Acute Care list provided to::  (n/a) Choice offered to / list presented to : NA [place] ownership interest in [facility].provided to::  (n/a)    Discharge Placement                       Discharge Plan and Services Additional resources added to the After Visit Summary for                  DME Arranged: N/A DME Agency: NA       HH Arranged: NA HH Agency: NA        Social Drivers of Health (SDOH) Interventions SDOH Screenings   Food Insecurity: No Food Insecurity (11/17/2023)  Housing: Low Risk  (11/17/2023)  Transportation Needs: No Transportation Needs (11/17/2023)  Utilities: Not At Risk (11/17/2023)  Depression (PHQ2-9): High Risk (06/22/2023)  Social Connections: Unknown (02/11/2022)   Received from [facility]  Tobacco Use: Low Risk  (11/17/2023)     Readmission Risk Interventions     No data to display

## 2023-11-24 NOTE — Pre-Procedure Instructions (Signed)
 Surgical Instructions   Your procedure is scheduled on November 27, 2023. Report to [facility] Main Entrance "A" at 12:30 P.M., then check in with the Admitting office. Any questions or running late day of surgery: call [phone]  Questions prior to your surgery date: call [phone], Monday-Friday, 8am-4pm. If you experience any cold or flu symptoms such as cough, fever, chills, shortness of breath, etc. between now and your scheduled surgery, please notify us at the above number.     Remember:  Do not eat after midnight the night before your surgery   You may drink clear liquids until 11:30 AM the morning of your surgery.   Clear liquids allowed are: Water, Non-Citrus Juices (without pulp), Carbonated Beverages, Clear Tea (no milk, honey, etc.), Black Coffee Only (NO MILK, CREAM OR POWDERED CREAMER of any kind), and Gatorade.    Take these medicines the morning of surgery with A SIP OF WATER: amLODipine (NORVASC)  pantoprazole (PROTONIX)    May take these medicines IF NEEDED: acetaminophen (TYLENOL)  ondansetron (ZOFRAN-ODT)    One week prior to surgery, STOP taking any Aspirin (unless otherwise instructed by your surgeon) Aleve, Naproxen, Ibuprofen, Motrin, Advil, Goody's, BC's, all herbal medications, fish oil, and non-prescription vitamins.                     Do NOT Smoke (Tobacco/Vaping) for 24 hours prior to your procedure.  If you use a CPAP at night, you may bring your mask/headgear for your overnight stay.   You will be asked to remove any contacts, glasses, piercing's, hearing aid's, dentures/partials prior to surgery. Please bring cases for these items if needed.    Patients discharged the day of surgery will not be allowed to drive home, and someone needs to stay with them for 24 hours.  SURGICAL WAITING ROOM VISITATION Patients may have no more than 2 support people in the waiting area - these visitors may rotate.   Pre-op nurse will coordinate an  appropriate time for 1 ADULT support person, who may not rotate, to accompany patient in pre-op.  Children under the age of 48 must have an adult with them who is not the patient and must remain in the main waiting area with an adult.  If the patient needs to stay at the hospital during part of their recovery, the visitor guidelines for inpatient rooms apply.  Please refer to the [facility] website for the visitor guidelines for any additional information.   If you received a COVID test during your pre-op visit  it is requested that you wear a mask when out in public, stay away from anyone that may not be feeling well and notify your surgeon if you develop symptoms. If you have been in contact with anyone that has tested positive in the last 10 days please notify you surgeon.      Pre-operative CHG Bathing Instructions   You can play a key role in reducing the risk of infection after surgery. Your skin needs to be as free of germs as possible. You can reduce the number of germs on your skin by washing with CHG (chlorhexidine gluconate) soap before surgery. CHG is an antiseptic soap that kills germs and continues to kill germs even after washing.   DO NOT use if you have an allergy to chlorhexidine/CHG or antibacterial soaps. If your skin becomes reddened or irritated, stop using the CHG and notify one of our RNs at [phone].  TAKE A SHOWER THE NIGHT BEFORE SURGERY AND THE DAY OF SURGERY    Please keep in mind the following:  DO NOT shave, including legs and underarms, 48 hours prior to surgery.   You may shave your face before/day of surgery.  Place clean sheets on your bed the night before surgery Use a clean washcloth (not used since being washed) for each shower. DO NOT sleep with pet's night before surgery.  CHG Shower Instructions:  Wash your face and private area with normal soap. If you choose to wash your hair, wash first with your normal shampoo.  After you use  shampoo/soap, rinse your hair and body thoroughly to remove shampoo/soap residue.  Turn the water OFF and apply half the bottle of CHG soap to a CLEAN washcloth.  Apply CHG soap ONLY FROM YOUR NECK DOWN TO YOUR TOES (washing for 3-5 minutes)  DO NOT use CHG soap on face, private areas, open wounds, or sores.  Pay special attention to the area where your surgery is being performed.  If you are having back surgery, having someone wash your back for you may be helpful. Wait 2 minutes after CHG soap is applied, then you may rinse off the CHG soap.  Pat dry with a clean towel  Put on clean pajamas    Additional instructions for the day of surgery: DO NOT APPLY any lotions, deodorants, cologne, or perfumes.   Do not wear jewelry or makeup Do not wear nail polish, gel polish, artificial nails, or any other type of covering on natural nails (fingers and toes) Do not bring valuables to the hospital. [facility] is not responsible for valuables/personal belongings. Put on clean/comfortable clothes.  Please brush your teeth.  Ask your nurse before applying any prescription medications to the skin.

## 2023-11-25 ENCOUNTER — Ambulatory Visit: Payer: Self-pay | Admitting: General Surgery

## 2023-11-25 ENCOUNTER — Encounter (HOSPITAL_COMMUNITY)
Admission: RE | Admit: 2023-11-25 | Discharge: 2023-11-25 | Disposition: A | Discharge status: Home / Self Care | Payer: Medicaid Other | Source: Ambulatory Visit | Attending: General Surgery | Admitting: General Surgery

## 2023-11-25 ENCOUNTER — Other Ambulatory Visit: Payer: Self-pay

## 2023-11-25 ENCOUNTER — Encounter (HOSPITAL_COMMUNITY): Payer: Self-pay

## 2023-11-25 VITALS — BP 118/69 | HR 112 | Temp 98.3°F | Resp 18 | Ht 61.0 in | Wt 137.8 lb

## 2023-11-25 DIAGNOSIS — I10 Essential (primary) hypertension: Secondary | ICD-10-CM

## 2023-11-25 DIAGNOSIS — Z01812 Encounter for preprocedural laboratory examination: Secondary | ICD-10-CM | POA: Diagnosis present

## 2023-11-25 DIAGNOSIS — Z01818 Encounter for other preprocedural examination: Secondary | ICD-10-CM

## 2023-11-25 HISTORY — DX: Anxiety disorder, unspecified: F41.9

## 2023-11-25 LAB — BASIC METABOLIC PANEL
Anion gap: 13 (ref 5–15)
BUN: 9 mg/dL (ref 6–20)
CO2: 23 mmol/L (ref 22–32)
Calcium: 8.7 mg/dL — ABNORMAL LOW (ref 8.9–10.3)
Chloride: 106 mmol/L (ref 98–111)
Creatinine, Ser: 0.78 mg/dL (ref 0.44–1.00)
GFR, Estimated: >60 mL/min (ref >60)
Glucose, Bld: 104 mg/dL — ABNORMAL HIGH (ref 70–99)
Potassium: 3.9 mmol/L (ref 3.5–5.1)
Sodium: 142 mmol/L (ref 135–145)

## 2023-11-25 NOTE — Progress Notes (Signed)
 PCP - Dr. Salli Real Cardiologist - denies  PPM/ICD - denies   Chest x-Jinkins - 10/04/23 EKG - 11/17/23 Stress Test - denies ECHO - denies Cardiac Cath - denies  Sleep Study - denies   DM- denies  Last dose of GLP1 agonist- n/a    ASA/Blood Thinner Instructions: n/a   ERAS Protcol - yes, no drink   COVID TEST- n/a   Anesthesia review: no  Patient denies shortness of breath, fever, cough and chest pain at PAT appointment   All instructions explained to the patient, with a verbal understanding of the material. Patient agrees to go over the instructions while at home for a better understanding.  The opportunity to ask questions was provided.

## 2023-11-27 ENCOUNTER — Ambulatory Visit (HOSPITAL_COMMUNITY): Admission: RE | Admit: 2023-11-27 | Payer: Medicaid Other | Source: Home / Self Care | Admitting: General Surgery

## 2023-11-27 ENCOUNTER — Encounter (HOSPITAL_COMMUNITY): Admission: RE | Payer: Self-pay | Source: Home / Self Care

## 2023-11-27 SURGERY — LAPAROSCOPIC CHOLECYSTECTOMY
Anesthesia: General

## 2024-01-25 ENCOUNTER — Emergency Department (HOSPITAL_COMMUNITY)

## 2024-01-25 ENCOUNTER — Inpatient Hospital Stay (HOSPITAL_COMMUNITY)
Admission: EM | Admit: 2024-01-25 | Discharge: 2024-01-27 | DRG: 445 | Disposition: A | Discharge status: Home / Self Care | Attending: Family Medicine | Admitting: Family Medicine

## 2024-01-25 ENCOUNTER — Encounter (HOSPITAL_COMMUNITY): Payer: Self-pay

## 2024-01-25 DIAGNOSIS — K8070 Calculus of gallbladder and bile duct without cholecystitis without obstruction: Principal | ICD-10-CM | POA: Diagnosis present

## 2024-01-25 DIAGNOSIS — E872 Acidosis, unspecified: Secondary | ICD-10-CM | POA: Diagnosis present

## 2024-01-25 DIAGNOSIS — D61818 Other pancytopenia: Secondary | ICD-10-CM | POA: Diagnosis present

## 2024-01-25 DIAGNOSIS — Z79899 Other long term (current) drug therapy: Secondary | ICD-10-CM | POA: Diagnosis not present

## 2024-01-25 DIAGNOSIS — F419 Anxiety disorder, unspecified: Secondary | ICD-10-CM | POA: Diagnosis present

## 2024-01-25 DIAGNOSIS — Z8616 Personal history of COVID-19: Secondary | ICD-10-CM | POA: Diagnosis not present

## 2024-01-25 DIAGNOSIS — R1011 Right upper quadrant pain: Principal | ICD-10-CM

## 2024-01-25 DIAGNOSIS — E876 Hypokalemia: Secondary | ICD-10-CM | POA: Diagnosis present

## 2024-01-25 DIAGNOSIS — K8020 Calculus of gallbladder without cholecystitis without obstruction: Secondary | ICD-10-CM

## 2024-01-25 DIAGNOSIS — I10 Essential (primary) hypertension: Secondary | ICD-10-CM | POA: Diagnosis present

## 2024-01-25 DIAGNOSIS — K219 Gastro-esophageal reflux disease without esophagitis: Secondary | ICD-10-CM | POA: Diagnosis present

## 2024-01-25 DIAGNOSIS — I5181 Takotsubo syndrome: Secondary | ICD-10-CM | POA: Diagnosis not present

## 2024-01-25 DIAGNOSIS — R7989 Other specified abnormal findings of blood chemistry: Secondary | ICD-10-CM

## 2024-01-25 DIAGNOSIS — K859 Acute pancreatitis without necrosis or infection, unspecified: Secondary | ICD-10-CM | POA: Diagnosis not present

## 2024-01-25 DIAGNOSIS — Z789 Other specified health status: Secondary | ICD-10-CM

## 2024-01-25 DIAGNOSIS — R112 Nausea with vomiting, unspecified: Secondary | ICD-10-CM | POA: Insufficient documentation

## 2024-01-25 DIAGNOSIS — R7401 Elevation of levels of liver transaminase levels: Secondary | ICD-10-CM

## 2024-01-25 DIAGNOSIS — A419 Sepsis, unspecified organism: Principal | ICD-10-CM | POA: Diagnosis present

## 2024-01-25 LAB — COMPREHENSIVE METABOLIC PANEL WITH GFR
ALT: 61 U/L — ABNORMAL HIGH (ref 0–44)
AST: 249 U/L — ABNORMAL HIGH (ref 15–41)
Albumin: 3.7 g/dL (ref 3.5–5.0)
Alkaline Phosphatase: 75 U/L (ref 38–126)
Anion gap: 19 — ABNORMAL HIGH (ref 5–15)
BUN: <5 mg/dL — ABNORMAL LOW (ref 6–20)
CO2: 24 mmol/L (ref 22–32)
Calcium: 8.4 mg/dL — ABNORMAL LOW (ref 8.9–10.3)
Chloride: 101 mmol/L (ref 98–111)
Creatinine, Ser: 0.84 mg/dL (ref 0.44–1.00)
GFR, Estimated: >60 mL/min (ref >60)
Glucose, Bld: 73 mg/dL (ref 70–99)
Potassium: 3.2 mmol/L — ABNORMAL LOW (ref 3.5–5.1)
Sodium: 144 mmol/L (ref 135–145)
Total Bilirubin: 1.2 mg/dL (ref 0.0–1.2)
Total Protein: 6.7 g/dL (ref 6.5–8.1)

## 2024-01-25 LAB — URINALYSIS, W/ REFLEX TO CULTURE (INFECTION SUSPECTED)
Bacteria, UA: NONE SEEN
Bilirubin Urine: NEGATIVE
Glucose, UA: NEGATIVE mg/dL
Hgb urine dipstick: NEGATIVE
Ketones, ur: 80 mg/dL — AB
Leukocytes,Ua: NEGATIVE
Nitrite: NEGATIVE
Protein, ur: 100 mg/dL — AB
Specific Gravity, Urine: 1.025 (ref 1.005–1.030)
pH: 6 (ref 5.0–8.0)

## 2024-01-25 LAB — CBC
HCT: 26.6 % — ABNORMAL LOW (ref 36.0–46.0)
Hemoglobin: 9 g/dL — ABNORMAL LOW (ref 12.0–15.0)
MCH: 34.2 pg — ABNORMAL HIGH (ref 26.0–34.0)
MCHC: 33.8 g/dL (ref 30.0–36.0)
MCV: 101.1 fL — ABNORMAL HIGH (ref 80.0–100.0)
Platelets: 115 10*3/uL — ABNORMAL LOW (ref 150–400)
RBC: 2.63 MIL/uL — ABNORMAL LOW (ref 3.87–5.11)
RDW: 19.3 % — ABNORMAL HIGH (ref 11.5–15.5)
WBC: 4.7 10*3/uL (ref 4.0–10.5)
nRBC: 1.3 % — ABNORMAL HIGH (ref 0.0–0.2)

## 2024-01-25 LAB — CBC WITH DIFFERENTIAL/PLATELET
Abs Immature Granulocytes: 0.03 10*3/uL (ref 0.00–0.07)
Basophils Absolute: 0 10*3/uL (ref 0.0–0.1)
Basophils Relative: 1 %
Eosinophils Absolute: 0 10*3/uL (ref 0.0–0.5)
Eosinophils Relative: 0 %
HCT: 26.2 % — ABNORMAL LOW (ref 36.0–46.0)
Hemoglobin: 8.9 g/dL — ABNORMAL LOW (ref 12.0–15.0)
Immature Granulocytes: 1 %
Lymphocytes Relative: 38 %
Lymphs Abs: 1.4 10*3/uL (ref 0.7–4.0)
MCH: 34.6 pg — ABNORMAL HIGH (ref 26.0–34.0)
MCHC: 34 g/dL (ref 30.0–36.0)
MCV: 101.9 fL — ABNORMAL HIGH (ref 80.0–100.0)
Monocytes Absolute: 0.4 10*3/uL (ref 0.1–1.0)
Monocytes Relative: 11 %
Neutro Abs: 1.9 10*3/uL (ref 1.7–7.7)
Neutrophils Relative %: 49 %
Platelets: 135 10*3/uL — ABNORMAL LOW (ref 150–400)
RBC: 2.57 MIL/uL — ABNORMAL LOW (ref 3.87–5.11)
RDW: 19.4 % — ABNORMAL HIGH (ref 11.5–15.5)
WBC: 3.8 10*3/uL — ABNORMAL LOW (ref 4.0–10.5)
nRBC: 1.6 % — ABNORMAL HIGH (ref 0.0–0.2)

## 2024-01-25 LAB — TECHNOLOGIST SMEAR REVIEW: Plt Morphology: NORMAL

## 2024-01-25 LAB — I-STAT CG4 LACTIC ACID, ED
Lactic Acid, Venous: 5.9 mmol/L (ref 0.5–1.9)
Lactic Acid, Venous: 2.1 mmol/L (ref 0.5–1.9)

## 2024-01-25 LAB — HCG, SERUM, QUALITATIVE: Preg, Serum: NEGATIVE

## 2024-01-25 LAB — FOLATE: Folate: 10.9 ng/mL (ref >5.9)

## 2024-01-25 LAB — PROTIME-INR
INR: 1.4 — ABNORMAL HIGH (ref 0.8–1.2)
Prothrombin Time: 17.8 s — ABNORMAL HIGH (ref 11.4–15.2)

## 2024-01-25 LAB — LIPASE, BLOOD: Lipase: 43 U/L (ref 11–51)

## 2024-01-25 LAB — VITAMIN B12: Vitamin B-12: 453 pg/mL (ref 180–914)

## 2024-01-25 MED ORDER — MORPHINE SULFATE (PF) 4 MG/ML IV SOLN
4.0000 mg | Freq: Once | INTRAVENOUS | Status: AC
  Administered 2024-01-25: 4 mg via INTRAVENOUS
  Filled 2024-01-25: qty 1

## 2024-01-25 MED ORDER — LACTATED RINGERS IV BOLUS
1000.0000 mL | Freq: Once | INTRAVENOUS | Status: AC
  Administered 2024-01-25: 1000 mL via INTRAVENOUS

## 2024-01-25 MED ORDER — PIPERACILLIN-TAZOBACTAM 3.375 G IVPB
3.3750 g | Freq: Three times a day (TID) | INTRAVENOUS | Status: DC
  Administered 2024-01-25 – 2024-01-26 (×4): 3.375 g via INTRAVENOUS
  Filled 2024-01-25 (×4): qty 50

## 2024-01-25 MED ORDER — AMLODIPINE BESYLATE 5 MG PO TABS
5.0000 mg | ORAL_TABLET | Freq: Every day | ORAL | Status: DC
  Administered 2024-01-25 – 2024-01-27 (×3): 5 mg via ORAL
  Filled 2024-01-25 (×3): qty 1

## 2024-01-25 MED ORDER — ONDANSETRON HCL 4 MG/2ML IJ SOLN
4.0000 mg | Freq: Once | INTRAMUSCULAR | Status: AC
  Administered 2024-01-25: 4 mg via INTRAVENOUS
  Filled 2024-01-25: qty 2

## 2024-01-25 MED ORDER — HEPARIN SODIUM (PORCINE) 5000 UNIT/ML IJ SOLN
5000.0000 [IU] | Freq: Three times a day (TID) | INTRAMUSCULAR | Status: AC
  Filled 2024-01-25 (×2): qty 1

## 2024-01-25 MED ORDER — ACETAMINOPHEN 325 MG PO TABS
650.0000 mg | ORAL_TABLET | Freq: Four times a day (QID) | ORAL | Status: DC | PRN
  Administered 2024-01-25 – 2024-01-27 (×2): 650 mg via ORAL
  Filled 2024-01-25 (×2): qty 2

## 2024-01-25 MED ORDER — POTASSIUM CHLORIDE CRYS ER 20 MEQ PO TBCR
40.0000 meq | EXTENDED_RELEASE_TABLET | Freq: Once | ORAL | Status: AC
  Administered 2024-01-25: 40 meq via ORAL
  Filled 2024-01-25: qty 2

## 2024-01-25 MED ORDER — SODIUM CHLORIDE 0.9 % IV SOLN: INTRAVENOUS | Status: AC

## 2024-01-25 MED ORDER — ACETAMINOPHEN 650 MG RE SUPP: 650.0000 mg | Freq: Four times a day (QID) | RECTAL | Status: DC | PRN

## 2024-01-25 MED ORDER — PROMETHAZINE HCL 25 MG PO TABS: 25.0000 mg | ORAL_TABLET | Freq: Four times a day (QID) | ORAL | Status: DC | PRN

## 2024-01-25 MED ORDER — OXYCODONE HCL 5 MG PO TABS
5.0000 mg | ORAL_TABLET | Freq: Four times a day (QID) | ORAL | Status: DC | PRN
  Administered 2024-01-25 – 2024-01-27 (×4): 5 mg via ORAL
  Filled 2024-01-25 (×4): qty 1

## 2024-01-25 MED ORDER — PROCHLORPERAZINE EDISYLATE 10 MG/2ML IJ SOLN
10.0000 mg | Freq: Once | INTRAMUSCULAR | Status: AC
  Administered 2024-01-25: 10 mg via INTRAVENOUS
  Filled 2024-01-25: qty 2

## 2024-01-25 MED ORDER — ACETAMINOPHEN 325 MG PO TABS
650.0000 mg | ORAL_TABLET | Freq: Once | ORAL | Status: AC
  Administered 2024-01-25: 650 mg via ORAL
  Filled 2024-01-25: qty 2

## 2024-01-25 MED ORDER — PIPERACILLIN-TAZOBACTAM 3.375 G IVPB 30 MIN
3.3750 g | Freq: Once | INTRAVENOUS | Status: AC
  Administered 2024-01-25: 3.375 g via INTRAVENOUS
  Filled 2024-01-25: qty 50

## 2024-01-25 MED ORDER — HYDROMORPHONE HCL 1 MG/ML IJ SOLN
0.5000 mg | INTRAMUSCULAR | Status: DC | PRN
  Filled 2024-01-25 (×2): qty 0.5

## 2024-01-25 NOTE — Progress Notes (Signed)
 Pharmacy Antibiotic Note  Melissa Guzman is a 28 y.o. female admitted on 01/25/2024 with sepsis.  Pharmacy has been consulted for zosyn  dosing.  Plan: Zosyn  3.375G IV q8 hours (4 hour infusion) Monitor clinical progression and cultures  Height: 5\' 1"  (154.9 cm) Weight: 59 kg (130 lb) IBW/kg (Calculated) : 47.8  Temp (24hrs), Avg:98.2 F (36.8 C), Min:97.9 F (36.6 C), Max:98.4 F (36.9 C)  Recent Labs  Lab 01/25/24 0254 01/25/24 0302 01/25/24 0510  WBC 3.8*  --   --   CREATININE 0.84  --   --   LATICACIDVEN  --  5.9* 2.1*    Estimated Creatinine Clearance: 84.5 mL/min (by C-G formula based on SCr of 0.84 mg/dL).    No Known Allergies   Thank you for allowing pharmacy to be a part of this patient's care.  Angelo Kennedy Mindie Rawdon 01/25/2024 7:55 AM

## 2024-01-25 NOTE — ED Notes (Signed)
 Pt to [place] via stretcher

## 2024-01-25 NOTE — Plan of Care (Signed)

## 2024-01-25 NOTE — Assessment & Plan Note (Signed)
 Mildly hypertensive but asymptomatic on presentation. Hasn't been on her home medication low dose amlodipine  5mg  daily -Continue home BP med -Monitor BP with routine vitals

## 2024-01-25 NOTE — ED Provider Notes (Addendum)
 Assumed care of patient from dr glick 28 yo female here with n/v abd pain Hx of biliary colic, gallstone pancreatitis, lap diag. Procedue Jan 2025 with Dr Davonna Estes noted to have sig admissions near [place]  RUQ ultrasound today with biliary sludge only Labs with mild transminitis, pancytopenia Lactate > 5, then >2 on repeat after fluids   Physical Exam  BP (!) 130/95   Pulse 88   Temp 97.9 F (36.6 C)   Resp (!) 24   Ht 5\' 1"  (1.549 m)   Wt 59 kg   SpO2 99%   BMI 24.56 kg/m   Physical Exam  Procedures  Procedures  ED Course / MDM    Medical Decision Making Amount and/or Complexity of Data Reviewed Labs: ordered. Radiology: ordered.  Risk OTC drugs. Prescription drug management. Decision regarding hospitalization.   BS antibiotic ordered per sepsis protocol - blood cx ordered by earlier provider, pt has had volume rescucitation 2L, BP stable, lactate improving  740 am - gen surgery consulted about possible biliary colic  Pending unassigned admission for pancytopenia, dehydration, possible sepsis   830 am - no callback from unassigned admission, secretary asked to repage to unassigned   920 am - admitted to family medicine service      Bonni Neuser, Janalyn Me, MD 01/25/24 [phone]

## 2024-01-25 NOTE — ED Triage Notes (Signed)
 Pt reports abd pain and vomiting that started yesterday. Reports hx of cholecystitis, still has gallbladder. Pt reports BP being elevated at home. Denies blurred vision or dizziness. Reports mild headache. EMS gave 4mg  Zofran  and 400mL LR. Attempted to give tylenol  PO but pt vomited after taking it.

## 2024-01-25 NOTE — Assessment & Plan Note (Addendum)
 Suspected cholelithiasis given nature of patient presentation, prior history of gallstone pancreatitis and mild transaminitis. Appropriate to get HIDA scan given US  findings. Gen surgery consulted in the ED for consideration of lap chole.  - Admit to [facility] med surg with attending Dr. Drue Gerald - Gen Surg consulted, Appreciate recs. - Tylenol  as needed for pain -Oxycodone  5 mg every 6 hours - Dilaudid  0.5 mg every 4 hours as needed for breakthrough pain -Home Phenergan 25 mg every 6 hours prn - NPO for potential HIDA -Follow up HIDA scan - Vitals per floor protocol - AM CBC, CMP -Heparin  for DVT prophylaxis, possible procedure

## 2024-01-25 NOTE — ED Notes (Signed)
 Pt laying in bed, fluids infusing, IV shows no s/s of infiltration. Lights turned off for comfort. Call light within reach

## 2024-01-25 NOTE — Assessment & Plan Note (Addendum)
 Unclear cause at this time. Differential include infectious process, vitamin deficiency, alcohol induced or malignant etiologies. CBC from 2 months ago were generally WNL. No recent change in medication and per patient no recent signs of infection. -Trend with daily CBC -Pending blood smear -Repeat CBC at 3pm -Ordered lab for Vitamin B12, Folate

## 2024-01-25 NOTE — ED Provider Notes (Signed)
 [place] EMERGENCY DEPARTMENT AT [facility] Provider Note   CSN: [id number] Arrival date & time: 01/25/24  0131     History  Chief Complaint  Patient presents with   Abdominal Pain    Melissa Guzman is a 28 y.o. female.  The history is provided by the patient.  Abdominal Pain She has history of hypertension, cholelithiasis and comes in because of recurrent upper abdominal pain, nausea, vomiting, chills.  She started vomiting this morning and developed chills and abdominal pain this evening at about 9 PM.  Pain does radiate to the back but not to the chest or shoulder.  She denies fever or sweats.  She has been having intermittent issues like this and has been diagnosed with cholelithiasis.  She states that she did have surgery but they were unable to remove her gallbladder.   Home Medications Prior to Admission medications   Medication Sig Start Date End Date Taking? Authorizing Provider  acetaminophen  (TYLENOL ) 500 MG tablet Take 2 tablets (1,000 mg total) by mouth every 8 (eight) hours as needed (pain). 03/05/22   Rosalita Combe, MD  amLODipine  (NORVASC ) 5 MG tablet Take 1 tablet (5 mg total) by mouth daily. 05/29/23 12/05/23  Vada Garibaldi, MD  ondansetron  (ZOFRAN -ODT) 4 MG disintegrating tablet Dissolve 1 tablet (4 mg total) by mouth every 8 (eight) hours as needed. 11/18/23   Leona Rake, MD  pantoprazole  (PROTONIX ) 40 MG tablet Take 1 tablet (40 mg total) by mouth daily. 11/18/23   Leona Rake, MD  potassium chloride  SA (KLOR-CON  M) 20 MEQ tablet Take 2 tablets (40 mEq total) by mouth daily for 5 days. Patient not taking: Reported on 11/25/2023 11/19/23 11/25/23  Leona Rake, MD      Allergies    Patient has no known allergies.    Review of Systems   Review of Systems  Gastrointestinal:  Positive for abdominal pain.  All other systems reviewed and are negative.   Physical Exam Updated Vital Signs BP (!) 130/95   Pulse 88   Temp 97.9 F  (36.6 C)   Resp (!) 24   SpO2 99%  Physical Exam Vitals and nursing note reviewed.   28 year old female, resting comfortably and in no acute distress, but she is having chills. Vital signs are significant for elevated blood pressure. Oxygen saturation is 100%, which is normal. Head is normocephalic and atraumatic. PERRLA, EOMI. Oropharynx is clear. Lungs are clear without rales, wheezes, or rhonchi. Chest is nontender. Heart has regular rate and rhythm without murmur. Abdomen is soft, flat, with mild to moderate epigastric and right upper quadrant tenderness with a positive Murphy sign. Skin is warm and dry without rash. Neurologic: Awake and alert, moves all extremities equally.  ED Results / Procedures / Treatments   Labs (all labs ordered are listed, but only abnormal results are displayed) Labs Reviewed  COMPREHENSIVE METABOLIC PANEL WITH GFR - Abnormal; Notable for the following components:      Result Value   Potassium 3.2 (*)    BUN <5 (*)    Calcium 8.4 (*)    AST 249 (*)    ALT 61 (*)    Anion gap 19 (*)    All other components within normal limits  CBC WITH DIFFERENTIAL/PLATELET - Abnormal; Notable for the following components:   WBC 3.8 (*)    RBC 2.57 (*)    Hemoglobin 8.9 (*)    HCT 26.2 (*)    MCV 101.9 (*)  MCH 34.6 (*)    RDW 19.4 (*)    Platelets 135 (*)    nRBC 1.6 (*)    All other components within normal limits  URINALYSIS, W/ REFLEX TO CULTURE (INFECTION SUSPECTED) - Abnormal; Notable for the following components:   Color, Urine AMBER (*)    APPearance HAZY (*)    Ketones, ur 80 (*)    Protein, ur 100 (*)    All other components within normal limits  PROTIME-INR - Abnormal; Notable for the following components:   Prothrombin Time 17.8 (*)    INR 1.4 (*)    All other components within normal limits  I-STAT CG4 LACTIC ACID, ED - Abnormal; Notable for the following components:   Lactic Acid, Venous 5.9 (*)    All other components within normal  limits  I-STAT CG4 LACTIC ACID, ED - Abnormal; Notable for the following components:   Lactic Acid, Venous 2.1 (*)    All other components within normal limits  CULTURE, BLOOD (ROUTINE X 2)  CULTURE, BLOOD (ROUTINE X 2)  LIPASE, BLOOD  HCG, SERUM, QUALITATIVE    EKG EKG Interpretation Date/Time:  Monday January 25 2024 03:27:20 EDT Ventricular Rate:  70 PR Interval:  132 QRS Duration:  85 QT Interval:  405 QTC Calculation: 437 R Axis:   67  Text Interpretation: Sinus rhythm Borderline T abnormalities, anterior leads When compared with ECG of 11/17/2023, T wave abnormality is less prominent Confirmed by Alissa April ([id number]) on 01/25/2024 4:10:45 AM  Radiology US  Abdomen Limited Result Date: 01/25/2024 CLINICAL DATA:  Right upper quadrant pain EXAM: ULTRASOUND ABDOMEN LIMITED RIGHT UPPER QUADRANT COMPARISON:  10/06/2023 FINDINGS: Gallbladder: Dependent echoes attributed primarily to sludge given nonshadowing, admixed calculi given abdominal CT findings 11/17/2023. No gallbladder wall thickening or focal tenderness. Common bile duct: Diameter: 3 mm Liver: Very echogenic liver with poor acoustic penetration. No focal mass. Portal vein is patent on color Doppler imaging with normal direction of blood flow towards the liver. IMPRESSION: Gallbladder sludge and calculi without acute cholecystitis. Hepatic steatosis. Electronically Signed   By: Ronnette Coke M.D.   On: 01/25/2024 04:28   DG Chest Port 1 View Result Date: 01/25/2024 CLINICAL DATA:  Abdominal pain EXAM: PORTABLE CHEST 1 VIEW COMPARISON:  CT chest dated 10/04/2023 FINDINGS: Lungs are clear.  No pleural effusion or pneumothorax. The heart is normal in size. IMPRESSION: No acute cardiopulmonary disease. Electronically Signed   By: Zadie Herter M.D.   On: 01/25/2024 02:48    Procedures Procedures  Cardiac monitor shows normal sinus rhythm, per my interpretation.  Medications Ordered in ED Medications  ondansetron  (ZOFRAN )  injection 4 mg (4 mg Intravenous Given 01/25/24 0341)  morphine  (PF) 4 MG/ML injection 4 mg (4 mg Intravenous Given 01/25/24 0341)  acetaminophen  (TYLENOL ) tablet 650 mg (650 mg Oral Given 01/25/24 0342)  lactated ringers  bolus 1,000 mL (0 mLs Intravenous Stopped 01/25/24 0609)  lactated ringers  bolus 1,000 mL (1,000 mLs Intravenous New Bag/Given 01/25/24 6295)    ED Course/ Medical Decision Making/ A&P                                 Medical Decision Making Amount and/or Complexity of Data Reviewed Labs: ordered. Radiology: ordered.  Risk OTC drugs. Prescription drug management. Decision regarding hospitalization.   Abdominal pain, vomiting, chills and patient with known history of cholelithiasis concerning for acute cholecystitis.  I have initiated the evolving sepsis pathway and  I have ordered morphine  for pain and ondansetron  for nausea.  I have reviewed her past records, and she was admitted 10/06/2023-10/09/2023 for acute pancreatitis which was found to be secondary to gallstones.  She did have an attempt at laparoscopic cholecystectomy, but was noted to have adhesions between the gallbladder and colon which were not able to be managed via laparoscopy.  Also admitted 11/17/2023-11/18/2023 with acute kidney injury.  Office note on 11/20/2023 states that surgery would be scheduled in the next several months and I note surgery was scheduled on 11/27/2023 but was canceled.  Because of chills, I have initiated the evolving sepsis pathway.  Patient does not appear overtly septic again heart rate is normal and blood pressure is actually elevated.  I have ordered morphine  for pain, ondansetron  for nausea, acetaminophen  for possible fever.  I have reviewed her laboratory tests, and my interpretation is ketonuria and proteinuria but no evidence of UTI, not pregnant, mild hypokalemia, elevated transaminase levels with significant increased compared with 11/18/2023, pancytopenia which is new compared with  11/17/2023 and significant drop in hemoglobin noted, markedly elevated lactic acid level which did not fit her clinical presentation.  I did order 1 L of fluid but not early goal-directed fluids.  Repeat lactic acid level has come down to almost normal.  Clinically, I do not feel she is septic and have not initiated antibiotics.  However, with new pancytopenia and elevation in her transaminases I feel it is appropriate to admit her.  She also needs to be considered for elective cholecystectomy.  Hospitalist has been paged, has not called back at this point.  Case is signed out to Dr Gordon Latus.  Final Clinical Impression(s) / ED Diagnoses Final diagnoses:  RUQ abdominal pain  Elevated lactic acid level  Calculus of gallbladder without cholecystitis without obstruction  Hypokalemia  Pancytopenia (HCC)  Elevated transaminase level    Rx / DC Orders ED Discharge Orders     None         Alissa April, MD 01/25/24 [phone]

## 2024-01-25 NOTE — Assessment & Plan Note (Signed)
 Initially considered's sepsis given, Leukopenic,  elevated lactic acid up to 5.9 and known suspected GI source given presentation. Reassuring patient has remained afebrile and non-toxic appearing on exam. Suspect elevated lactic accede is likely secondary to volume depletion from episodes of vomiting and poor p.o. intake. - Will continue antibiotics for another 24 hours - Continue to monitor vitals per floor protocol - Currently on low MIV fluid for volume resuscitation

## 2024-01-25 NOTE — H&P (Signed)
 Hospital Admission History and Physical Service Pager: [phone]  Patient name: Melissa Guzman Medical record number: [id number] Date of Birth: 04/13/1996 Age: 28 y.o. Gender: female  Primary Care Provider: [facility] [place] Medical Consultants: Gen Surg Code Status: Full Preferred Emergency Contact:   Name Relation Home Work [place] Sister   [phone]    Chief Complaint: Abdominal pain   Assessment and Plan: Quin Blossom [name] is a 28 y.o. female presenting with Abdominal pain . Differential for presentation of this includes cholelithiasis, pancreatitis, alcohol poisoning and gastroenteritis.  Cholelithiasis-less suspicious given colicky abdominal pain associated nausea and vomiting.  Mild transaminitis on exam and prior history of gallstone pancreatitis. Pancreatitis-suspected due to abdominal pain radiating to the back however given normal lipase Gastroenteritis-Suspected given nausea and vomiting. Less likely given colicky abdominal pain, patient has been afebrile with normal BM    Assessment & Plan RUQ abdominal pain Suspected cholelithiasis given nature of patient presentation, prior history of gallstone pancreatitis and mild transaminitis. Appropriate to get HIDA scan given US  findings. Gen surgery consulted in the ED for consideration of lap chole.  - Admit to FMTS med surg with attending Dr. Drue Gerald - Gen Surg consulted, Appreciate recs. - Tylenol  as needed for pain -Oxycodone  5 mg every 6 hours - Dilaudid  0.5 mg every 4 hours as needed for breakthrough pain -Home Phenergan 25 mg every 6 hours prn - NPO for potential HIDA -Follow up HIDA scan - Vitals per floor protocol - AM CBC, CMP -Heparin  for DVT prophylaxis, possible procedure Pancytopenia (HCC) Unclear cause at this time. Differential include infectious process, vitamin deficiency, alcohol induced or malignant etiologies. CBC from 2 months ago were generally WNL. No recent  change in medication and per patient no recent signs of infection. -Trend with daily CBC -Pending blood smear -Repeat CBC at 3pm -Ordered lab for Vitamin B12, Folate Essential hypertension Mildly hypertensive but asymptomatic on presentation. Hasn't been on her home medication low dose amlodipine  5mg  daily -Continue home BP med -Monitor BP with routine vitals    Sepsis (HCC) Initially considered's sepsis given, Leukopenic,  elevated lactic acid up to 5.9 and known suspected GI source given presentation. Reassuring patient has remained afebrile and non-toxic appearing on exam. Suspect elevated lactic accede is likely secondary to volume depletion from episodes of vomiting and poor p.o. intake. - Will continue antibiotics for another 24 hours - Continue to monitor vitals per floor protocol - Currently on low MIV fluid for volume resuscitation  FEN/GI: NPO VTE Prophylaxis: Heparin   Disposition: Progressive   History of Present Illness:  Melissa Guzman is a 28 y.o. female presenting with right upper quadrant abdominal pain.  Patient's symptoms started yesterday associated nausea and vomiting.  She has had multiple episodes of nausea and vomiting, nonbilious and nonbloody.  Denies any fevers, chills or diarrhea.  Abdominal pain described as sharp "in and out" pain that radiate to the back with associated NBNB emesis.  Patient did report prior to onset of symptoms was out with her friends where she put shots of tequila although unable to quantify. Describes her self as a social drinker, not a weekly drinker.  Of note admitted   back in January of this year for similar symptoms with abdominal pain, nausea and vomiting.  At that time was told that she has gallstone pancreatitis. Gen surgery attempted laparoscopic cholecystectomy at the time however procedure was halted due to abdominal inflammation  and given risk of bowel rupture.  Eventually treated with antibiotics and pain control.   Patient's symptoms have since improved but still endorses intermittent right upper quadrant pain with nausea and vomiting but hasn't required readmission until now.   In the ED patient remained afebrile and vitals generally unremarkable other than mild tachycardia. CMP showed mild transaminitis and normal lipase.  Lactic acid was elevated and improved after 2 IV boluses.  Patient received morphine  which improved her pain.  General surgery consulted for consideration of cholecystectomy.  Review Of Systems: Per HPI with the following additions:   Pertinent Past Medical History: Biliary colic Gallstone pancreatitis HTN Anxiety   Pertinent Past Surgical History:  Remainder reviewed in history tab.   Pertinent Social History: Tobacco use: Never Alcohol use: Social drinker Other Substance use: Marijuana a few days a week Lives with 1-year-old daughter  Pertinent Family History: Remainder reviewed in history tab.   Important Outpatient Medications: Amlodipine  5 mg daily Pantoprazole  40 mg daily   Objective: BP (!) 127/96   Pulse 62   Temp 98.7 F (37.1 C) (Oral)   Resp 14   Ht 5\' 1"  (1.549 m)   Wt 59 kg   SpO2 100%   BMI 24.56 kg/m  Exam: General: Well appearing, she has stated age, NAD Eyes: No scleral icterus, pupils equal and reactive to light. Cardiovascular: RRR, no murmurs normal S1/S2 Respiratory: CTAB, no wheezing or rales Gastrointestinal: No distention, no abdominal tenderness, positive bowel sounds, no guarding or rebound tenderness.  Negative Murphy sign Neuro: A and O x 3, no focal neurological deficits Psych: Normal affect, good judgment.  Labs:  CBC BMET  Recent Labs  Lab 01/25/24 0254  WBC 3.8*  HGB 8.9*  HCT 26.2*  PLT 135*   Recent Labs  Lab 01/25/24 0254  NA 144  K 3.2*  CL 101  CO2 24  BUN <5*  CREATININE 0.84  GLUCOSE 73  CALCIUM 8.4*    Pertinent additional labs  Lipase-43 Lactic acid-5.9 > 2.1.  EKG: My own interpretation (not  copied from electronic read)     Imaging Studies Performed: ## Chest x-[place] My Interpretation: No cardiopulmonary disease process.  ## Abdominal ultrasound My interpretation: Gallbladder smudge, no elevation of common bile duct.   Goble Last, MD 01/25/2024, 11:38 AM PGY-3, [facility]  FPTS Intern pager: [phone], text pages welcome Secure chat group [facility] Teaching Service

## 2024-01-25 NOTE — Consult Note (Signed)
 Consult Note  Melissa Guzman 1996/09/12  [id number].    Requesting MD: Dr. Gordon Latus Chief Complaint/Reason for Consult: abdominal pain, cholelithiasis  HPI:  28 y.o. female with medical history significant for anemia, anxiety, HTN, gallstone pancreatitis who presented to [facility] ED with abdominal pain, nausea, vomiting. She was admitted January of this year with pancreatitis with gallbladder etiology suspected and underwent diagnostic laparoscopy with findings of significant adhesions precluding cholecystectomy. She states since then she has had intermittent abdominal pain, nausea, vomiting at least once per month. She was admitted to [facility] service in February for these symptoms with CT abd/pelvis without acute abnormalities at that time. She was treated for gastritis and cautioned to cut back on cannabis use with concern for cannabis hyperemesis contributing to her symptoms. She was discharged tolerating diet with outpatient surgical follow up planned. Her current episode of symptoms began on Saturday as significant abdominal pain, nausea, and emesis. She has not eaten since prior to symptom onset. She drinks several shots on weekends only and not every weekend but had been drinking prior to symptom onset. Currently she is still having abdominal pain, chills, nausea. She has SHOB secondary to pain. She denies urinary symptoms and fever.  Substance use: alcohol use. Cannabis use Allergies: NKDA Blood thinners: none Past Surgeries: c section, dx lap as above   ROS: Reviewed and as above  History reviewed. No pertinent family history.  Past Medical History:  Diagnosis Date   Acute blood loss anemia 03/01/2022   Anemia    Anxiety    COVID-19 affecting pregnancy in third trimester 02/04/2022   +01/28/2022   Endometritis 03/05/2022   Hypertension    Postpartum septic endometritis 03/09/2022   UTI (urinary tract infection) 05/26/2023    Past Surgical History:  Procedure  Laterality Date   CESAREAN SECTION N/A 03/01/2022   Procedure: CESAREAN SECTION;  Surgeon: Othelia Blinks, MD;  Location: [facility] LD ORS;  Service: Obstetrics;  Laterality: N/A;   DIAGNOSTIC LAPAROSCOPIC LIVER BIOPSY N/A 10/09/2023   Procedure: DIAGNOSTIC LAPAROSCOPY;  Surgeon: Cannon [place], MD;  Location: [facility] OR;  Service: General;  Laterality: N/A;   IR RADIOLOGIST EVAL & MGMT  03/28/2022   IR RADIOLOGIST EVAL & MGMT  04/10/2022    Social History:  reports that she has never smoked. She has never used smokeless tobacco. She reports that she does not currently use alcohol. She reports current drug use. Frequency: 3.00 times per week. Drug: Marijuana.  Allergies: No Known Allergies  (Not in a hospital admission)   Blood pressure (!) 143/97, pulse 62, temperature 97.9 F (36.6 C), resp. rate 14, height 5\' 1"  (1.549 m), weight 59 kg, SpO2 100%, not currently breastfeeding. Physical Exam: General: pleasant, WD, female who is laying in bed in NAD HEENT: head is normocephalic, atraumatic.  Sclera are noninjected.  Pupils equal and round. EOMs intact.  Ears and nose without any masses or lesions.  Mouth is pink and moist Heart: regular, rate, and rhythm.  Normal s1,s2. No obvious murmurs, gallops, or rubs noted.  Palpable radial and pedal pulses bilaterally Lungs: Respiratory effort nonlabored Abd: soft, ND, no masses, hernias, or organomegaly. TTP voluntary guarding in epigastrium and RUQ. Pain worse in epigastrium MSK: all 4 extremities are symmetrical with no cyanosis, clubbing, or edema. Skin: warm and dry with no masses, lesions, or rashes Neuro: Cranial nerves 2-12 grossly intact, sensation is normal throughout Psych: A&Ox3 with an appropriate affect.    Results for orders placed  or performed during the hospital encounter of 01/25/24 (from the past 48 hours)  Comprehensive metabolic panel     Status: Abnormal   Collection Time: 01/25/24  2:54 AM  Result Value Ref Range   Sodium 144  135 - 145 mmol/L   Potassium 3.2 (L) 3.5 - 5.1 mmol/L   Chloride 101 98 - 111 mmol/L   CO2 24 22 - 32 mmol/L   Glucose, Bld 73 70 - 99 mg/dL    Comment: Glucose reference range applies only to samples taken after fasting for at least 8 hours.   BUN <5 (L) 6 - 20 mg/dL   Creatinine, Ser 4.09 0.44 - 1.00 mg/dL   Calcium 8.4 (L) 8.9 - 10.3 mg/dL   Total Protein 6.7 6.5 - 8.1 g/dL   Albumin 3.7 3.5 - 5.0 g/dL   AST 811 (H) 15 - 41 U/L   ALT 61 (H) 0 - 44 U/L   Alkaline Phosphatase 75 38 - 126 U/L   Total Bilirubin 1.2 0.0 - 1.2 mg/dL   GFR, Estimated >91 >47 mL/min    Comment: (NOTE) Calculated using the CKD-EPI Creatinine Equation (2021)    Anion gap 19 (H) 5 - 15    Comment: Performed at [facility], [address] [place], [place] [address]  CBC with Differential     Status: Abnormal   Collection Time: 01/25/24  2:54 AM  Result Value Ref Range   WBC 3.8 (L) 4.0 - 10.5 K/uL   RBC 2.57 (L) 3.87 - 5.11 MIL/uL   Hemoglobin 8.9 (L) 12.0 - 15.0 g/dL   HCT 21.3 (L) 08.6 - 57.8 %   MCV 101.9 (H) 80.0 - 100.0 fL   MCH 34.6 (H) 26.0 - 34.0 pg   MCHC 34.0 30.0 - 36.0 g/dL   RDW 46.9 (H) 62.9 - 52.8 %   Platelets 135 (L) 150 - 400 K/uL   nRBC 1.6 (H) 0.0 - 0.2 %   Neutrophils Relative % 49 %   Neutro Abs 1.9 1.7 - 7.7 K/uL   Lymphocytes Relative 38 %   Lymphs Abs 1.4 0.7 - 4.0 K/uL   Monocytes Relative 11 %   Monocytes Absolute 0.4 0.1 - 1.0 K/uL   Eosinophils Relative 0 %   Eosinophils Absolute 0.0 0.0 - 0.5 K/uL   Basophils Relative 1 %   Basophils Absolute 0.0 0.0 - 0.1 K/uL   Immature Granulocytes 1 %   Abs Immature Granulocytes 0.03 0.00 - 0.07 K/uL    Comment: Performed at [facility], [address], [place], [place] [address]  Blood Culture (routine x 2)     Status: None (Preliminary result)   Collection Time: 01/25/24  2:54 AM   Specimen: BLOOD  Result Value Ref Range   Specimen Description BLOOD SITE NOT SPECIFIED    Special Requests      BOTTLES DRAWN  AEROBIC AND ANAEROBIC Blood Culture adequate volume   Culture      NO GROWTH < 12 HOURS Performed at [facility], [address] [place], [place] [address]    Report Status PENDING   Lipase, blood     Status: None   Collection Time: 01/25/24  2:54 AM  Result Value Ref Range   Lipase 43 11 - 51 U/L    Comment: Performed at [facility], [address], [place], [place] [address]  Blood Culture (routine x 2)     Status: None (Preliminary result)  Collection Time: 01/25/24  2:59 AM   Specimen: BLOOD  Result Value Ref Range   Specimen Description BLOOD SITE NOT SPECIFIED    Special Requests      BOTTLES DRAWN AEROBIC AND ANAEROBIC Blood Culture adequate volume   Culture      NO GROWTH < 12 HOURS Performed at [facility], [address], [place], [place] [address]    Report Status PENDING   I-Stat Lactic Acid, ED     Status: Abnormal   Collection Time: 01/25/24  3:02 AM  Result Value Ref Range   Lactic Acid, Venous 5.9 (HH) 0.5 - 1.9 mmol/L   Comment NOTIFIED PHYSICIAN   Protime-INR     Status: Abnormal   Collection Time: 01/25/24  3:40 AM  Result Value Ref Range   Prothrombin Time 17.8 (H) 11.4 - 15.2 seconds   INR 1.4 (H) 0.8 - 1.2    Comment: (NOTE) INR goal varies based on device and disease states. Performed at [facility], [address] [place], [place] [address]   hCG, serum, qualitative     Status: None   Collection Time: 01/25/24  3:40 AM  Result Value Ref Range   Preg, Serum NEGATIVE NEGATIVE    Comment:        THE SENSITIVITY OF THIS METHODOLOGY IS >10 mIU/mL. Performed at [facility], [address] [place], [place] [address]   Urinalysis, w/ Reflex to Culture (Infection Suspected) -Urine, Clean Catch     Status: Abnormal   Collection Time: 01/25/24  4:30 AM  Result Value Ref Range   Specimen Source URINE, CLEAN CATCH    Color, Urine AMBER (A) YELLOW    Comment: BIOCHEMICALS MAY BE AFFECTED BY COLOR   APPearance HAZY  (A) CLEAR   Specific Gravity, Urine 1.025 1.005 - 1.030   pH 6.0 5.0 - 8.0   Glucose, UA NEGATIVE NEGATIVE mg/dL   Hgb urine dipstick NEGATIVE NEGATIVE   Bilirubin Urine NEGATIVE NEGATIVE   Ketones, ur 80 (A) NEGATIVE mg/dL   Protein, ur 914 (A) NEGATIVE mg/dL   Nitrite NEGATIVE NEGATIVE   Leukocytes,Ua NEGATIVE NEGATIVE   RBC / HPF 6-10 0 - 5 RBC/hpf   WBC, UA 0-5 0 - 5 WBC/hpf    Comment:        Reflex urine culture not performed if WBC <=10, OR if Squamous epithelial cells >5. If Squamous epithelial cells >5 suggest recollection.    Bacteria, UA NONE SEEN NONE SEEN   Squamous Epithelial / HPF 6-10 0 - 5 /HPF   Mucus PRESENT     Comment: Performed at [facility], [address], [place], [place] [address]  I-Stat Lactic Acid, ED     Status: Abnormal   Collection Time: 01/25/24  5:10 AM  Result Value Ref Range   Lactic Acid, Venous 2.1 (HH) 0.5 - 1.9 mmol/L   Comment NOTIFIED PHYSICIAN    US  Abdomen Limited Result Date: 01/25/2024 CLINICAL DATA:  Right upper quadrant pain EXAM: ULTRASOUND ABDOMEN LIMITED RIGHT UPPER QUADRANT COMPARISON:  10/06/2023 FINDINGS: Gallbladder: Dependent echoes attributed primarily to sludge given nonshadowing, admixed calculi given abdominal CT findings 11/17/2023. No gallbladder wall thickening or focal tenderness. Common bile duct: Diameter: 3 mm Liver: Very echogenic liver with poor acoustic penetration. No focal mass. Portal vein is patent on color Doppler imaging with normal direction of blood flow towards the liver. IMPRESSION: Gallbladder sludge and calculi without acute cholecystitis. Hepatic steatosis. Electronically Signed  By: Ronnette Coke M.D.   On: 01/25/2024 04:28   DG Chest Port 1 View Result Date: 01/25/2024 CLINICAL DATA:  Abdominal pain EXAM: PORTABLE CHEST 1 VIEW COMPARISON:  CT chest dated 10/04/2023 FINDINGS: Lungs are clear.  No pleural effusion or pneumothorax. The heart is normal in size. IMPRESSION: No acute  cardiopulmonary disease. Electronically Signed   By: Zadie Herter M.D.   On: 01/25/2024 02:48      Assessment/Plan Cholelithiasis, ?cholecystitis Pancytopenia Transaminitis Lactic acidosis  Patient seen and examined and relevant labs and imaging personally reviewed. She has history of pancreatitis with presumed gallstone etiology in January s/p dx laparoscopy. She has had recurrent symptoms of n/v/abdominal pain since then and US  this admission with gallbladder sludge and calculi and hepatic steatosis without cholecystitis. AST/ALT elevated. T bili normal. Lipase normal. Symptoms concerning for biliary colic and will get HIDA to evaluate for cholecystitis but also concerned for other issues given her pancytopenia and lactic acidosis. Agree with medical admission for further work up. Agree with empiric abx. She had significant adhesions on dx lap in January and if HIDA positive may need to consider perc chole and interval appendectomy pending other work up. We will continue to follow   FEN: NPO for HIDA ID: [email or web address]  VTE: okay for chemical prophylaxis from surgical standpoint   I reviewed ED provider notes, last 24 h vitals and pain scores, last 48 h intake and output, last 24 h labs and trends, and last 24 h imaging results.   Elwin Hammond, [facility] Surgery 01/25/2024, 10:24 AM Please see Amion for pager number during day hours 7:00am-4:30pm

## 2024-01-25 NOTE — Plan of Care (Signed)
 FMTS Interim Progress Note  S: Evaluated patient at bedside with Dr. Fernand Howard.  Patient reports that she is comfortable at this time.  Pain located in epigastric/right upper quadrant.  She is aware of HIDA scan tomorrow.  She has no acute concerns.  O: BP 124/89 (BP Location: Left Arm)   Pulse 81   Temp 98.3 F (36.8 C) (Oral)   Resp 18   Ht 5\' 1"  (1.549 m)   Wt 59 kg   SpO2 99%   BMI 24.56 kg/m    General: Resting comfortably, NAD CV: RRR, no murmurs Respiratory: CTAB, normal work of breathing room air GI: Tenderness to epigastric/right upper quadrant.  Bowel sounds present.  A/P: RUQ abdominal pain  pancytopenia Pending HIDA scan.  General surgery on board. - AM labs - Continue antibiotics - N.p.o. at midnight, hold heparin  after midnight - Continue plan of care as outlined by family medicine H&P on 01/25/2024   Lavada Porteous, DO 01/25/2024, 9:30 PM PGY-2, [facility] Family Medicine Service pager [phone]

## 2024-01-26 ENCOUNTER — Inpatient Hospital Stay (HOSPITAL_COMMUNITY)

## 2024-01-26 DIAGNOSIS — R1011 Right upper quadrant pain: Secondary | ICD-10-CM | POA: Diagnosis not present

## 2024-01-26 DIAGNOSIS — Z789 Other specified health status: Secondary | ICD-10-CM

## 2024-01-26 LAB — BASIC METABOLIC PANEL WITH GFR
Anion gap: 14 (ref 5–15)
BUN: <5 mg/dL — ABNORMAL LOW (ref 6–20)
CO2: 28 mmol/L (ref 22–32)
Calcium: 8 mg/dL — ABNORMAL LOW (ref 8.9–10.3)
Chloride: 95 mmol/L — ABNORMAL LOW (ref 98–111)
Creatinine, Ser: 0.81 mg/dL (ref 0.44–1.00)
GFR, Estimated: >60 mL/min (ref >60)
Glucose, Bld: 103 mg/dL — ABNORMAL HIGH (ref 70–99)
Potassium: 3.3 mmol/L — ABNORMAL LOW (ref 3.5–5.1)
Sodium: 137 mmol/L (ref 135–145)

## 2024-01-26 LAB — CBC
HCT: 27.9 % — ABNORMAL LOW (ref 36.0–46.0)
Hemoglobin: 9.4 g/dL — ABNORMAL LOW (ref 12.0–15.0)
MCH: 33.3 pg (ref 26.0–34.0)
MCHC: 33.7 g/dL (ref 30.0–36.0)
MCV: 98.9 fL (ref 80.0–100.0)
Platelets: 116 10*3/uL — ABNORMAL LOW (ref 150–400)
RBC: 2.82 MIL/uL — ABNORMAL LOW (ref 3.87–5.11)
RDW: 18 % — ABNORMAL HIGH (ref 11.5–15.5)
WBC: 3.2 10*3/uL — ABNORMAL LOW (ref 4.0–10.5)
nRBC: 1.9 % — ABNORMAL HIGH (ref 0.0–0.2)

## 2024-01-26 LAB — COMPREHENSIVE METABOLIC PANEL WITH GFR
ALT: 47 U/L — ABNORMAL HIGH (ref 0–44)
AST: 140 U/L — ABNORMAL HIGH (ref 15–41)
Albumin: 3 g/dL — ABNORMAL LOW (ref 3.5–5.0)
Alkaline Phosphatase: 64 U/L (ref 38–126)
Anion gap: 12 (ref 5–15)
BUN: <5 mg/dL — ABNORMAL LOW (ref 6–20)
CO2: 28 mmol/L (ref 22–32)
Calcium: 7.7 mg/dL — ABNORMAL LOW (ref 8.9–10.3)
Chloride: 97 mmol/L — ABNORMAL LOW (ref 98–111)
Creatinine, Ser: 0.82 mg/dL (ref 0.44–1.00)
GFR, Estimated: >60 mL/min (ref >60)
Glucose, Bld: 88 mg/dL (ref 70–99)
Potassium: 2.9 mmol/L — ABNORMAL LOW (ref 3.5–5.1)
Sodium: 137 mmol/L (ref 135–145)
Total Bilirubin: 1.4 mg/dL — ABNORMAL HIGH (ref 0.0–1.2)
Total Protein: 5.9 g/dL — ABNORMAL LOW (ref 6.5–8.1)

## 2024-01-26 LAB — MONONUCLEOSIS SCREEN: Mono Screen: NEGATIVE

## 2024-01-26 MED ORDER — ENOXAPARIN SODIUM 40 MG/0.4ML IJ SOSY
40.0000 mg | PREFILLED_SYRINGE | INTRAMUSCULAR | Status: DC
  Administered 2024-01-26: 40 mg via SUBCUTANEOUS
  Filled 2024-01-26: qty 0.4

## 2024-01-26 MED ORDER — SODIUM CHLORIDE 0.9 % IV SOLN: INTRAVENOUS | Status: DC

## 2024-01-26 MED ORDER — LORATADINE 10 MG PO TABS
10.0000 mg | ORAL_TABLET | Freq: Every day | ORAL | Status: DC
Start: 2024-01-26 — End: 2024-01-27
  Administered 2024-01-26 – 2024-01-27 (×2): 10 mg via ORAL
  Filled 2024-01-26 (×2): qty 1

## 2024-01-26 MED ORDER — POTASSIUM CHLORIDE CRYS ER 20 MEQ PO TBCR
40.0000 meq | EXTENDED_RELEASE_TABLET | ORAL | Status: AC
  Administered 2024-01-26 (×2): 40 meq via ORAL
  Filled 2024-01-26 (×2): qty 2

## 2024-01-26 MED ORDER — TECHNETIUM TC 99M MEBROFENIN IV KIT
5.5000 | PACK | Freq: Once | INTRAVENOUS | Status: AC | PRN
  Administered 2024-01-26: 5.5 via INTRAVENOUS

## 2024-01-26 MED ORDER — FLUTICASONE PROPIONATE 50 MCG/ACT NA SUSP
2.0000 | Freq: Every day | NASAL | Status: DC
  Administered 2024-01-26 – 2024-01-27 (×2): 2 via NASAL
  Filled 2024-01-26: qty 16

## 2024-01-26 MED ORDER — POTASSIUM CHLORIDE CRYS ER 20 MEQ PO TBCR
40.0000 meq | EXTENDED_RELEASE_TABLET | Freq: Once | ORAL | Status: AC
  Administered 2024-01-26: 40 meq via ORAL
  Filled 2024-01-26: qty 2

## 2024-01-26 NOTE — Assessment & Plan Note (Signed)
 K 2.9.  Repleted with 40 mEq. - replete with 40meq - PM BMP to trend, additional repletion as necessary - AM BMP

## 2024-01-26 NOTE — Progress Notes (Addendum)
     Daily Progress Note Intern Pager: [phone]  Patient name: Melissa Guzman Medical record number: [id number] Date of birth: 23-Mar-1996 Age: 28 y.o. Gender: female  Primary Care Provider: [facility] Medical Consultants: General Surgery Code Status: Full  Pt Overview and Major Events to Date:  4/28-admitted  Assessment and Plan: Melissa Guzman is a 28 year old female who presented with abdominal pain.  HIDA scan pending and will likely need lap chole.  Incidentally found to have pancytopenia requiring additional workup. Assessment & Plan RUQ abdominal pain HIDA scan today, and likely lap chole with surgery. - Gen Surg consulted, appreciate recommendations. - Pain regimen: Tylenol  650 mg q6 PRN, oxycodone  5 mg q6 PRN for moderate pain, Dilaudid  0.5 mg q4 PRN for severe/breakthrough pain. - Home Phenergan 25 mg every 6 hours PRN for N/V - Follow up HIDA scan - AM CBC, CMP Pancytopenia (HCC) Folate, B12 WNL.  Smear with smudge cells, normal RBC and platelet morphology.  Persistent pancytopenia CBC this AM.  Spoke with Dr. Arno Bibles, HemeOnc, recommendations below. - Trend with daily CBC - Heme-onc recommends outpatient follow-up for further workup Hypokalemia K 2.9.  Repleted with 40 mEq. - replete with 40meq - PM BMP to trend, additional repletion as necessary - AM BMP Sepsis (HCC) Patient has remained afebrile, VSS overnight.  Nontoxic on exam.  Elevated lactic acid was likely secondary to volume depletion from many episodes of vomiting and poor PO intake in the setting of abdominal pain.  Doubt true sepsis. - Continue IV Zosyn  q8 Chronic health problem HTN-Home amlodipine  5 mg daily Anxiety   FEN/GI: CLD per surgery, pending surgical interventions PPx: SCDs Dispo:Home pending clinical improvement .   Subjective:  Patient seen resting in bed with boyfriend Marcus at bedside.  She is having some abdominal discomfort, but not as bad as it was earlier.   No other concerns.  Objective: Temp:  [98.3 F (36.8 C)-98.8 F (37.1 C)] 98.7 F (37.1 C) (04/29 1033) Pulse Rate:  [77-89] 89 (04/29 1033) Resp:  [16-18] 17 (04/29 1033) BP: (119-131)/(89-96) 122/96 (04/29 1033) SpO2:  [98 %-100 %] 100 % (04/29 1033) Physical Exam: General: Well-appearing, very pleasant, sitting in bed comfortably. Cardiovascular: RRR, no murmurs Respiratory: CTA bilaterally, normal work of breathing on room air Abdomen: Normoactive bowel sounds.  Soft, TTP on left and in epigastric region.  No hepatosplenomegaly. Extremities: Moves all equally.  No bruising on skin exam.  Laboratory: Most recent CBC Lab Results  Component Value Date   WBC 3.2 (L) 01/26/2024   HGB 9.4 (L) 01/26/2024   HCT 27.9 (L) 01/26/2024   MCV 98.9 01/26/2024   PLT 116 (L) 01/26/2024   Most recent BMP    Latest Ref Rng & Units 01/26/2024    6:02 AM  BMP  Glucose 70 - 99 mg/dL 88   BUN 6 - 20 mg/dL <5   Creatinine 9.56 - 1.00 mg/dL 2.13   Sodium 086 - 578 mmol/L 137   Potassium 3.5 - 5.1 mmol/L 2.9   Chloride 98 - 111 mmol/L 97   CO2 22 - 32 mmol/L 28   Calcium 8.9 - 10.3 mg/dL 7.7    Folate 46.9 G29 453  Omar Bibber, DO 01/26/2024, 12:56 PM  PGY-1, [facility] FPTS Intern pager: [phone], text pages welcome Secure chat group [facility] Teaching Service

## 2024-01-26 NOTE — Plan of Care (Signed)
  Problem: Pain Managment: Goal: General experience of comfort will improve and/or be controlled Outcome: Progressing   Problem: Safety: Goal: Ability to remain free from injury will improve Outcome: Progressing

## 2024-01-26 NOTE — Assessment & Plan Note (Addendum)
 Folate, B12 WNL.  Smear with smudge cells, normal RBC and platelet morphology.  Persistent pancytopenia CBC this AM.  Spoke with Dr. Arno Bibles, HemeOnc, recommendations below. - Trend with daily CBC - Heme-onc recommends outpatient follow-up for further workup

## 2024-01-26 NOTE — Hospital Course (Addendum)
 Melissa Guzman is a 28 y.o.female with a history of HTN, GERD, gallstone pancreatitis s/p attempted cholecystectomy in Jan 2025 who was admitted to the [facility] Medicine Teaching Service at [facility] for RUQ abdominal pain suspicious for cholecystitis.   Her hospital course is detailed below:  Cholelithiasis with Biliary Colic Patient initially presented to the ED with significant abdominal pain and 1 day of nausea and vomiting.  Labs revealed mild transaminitis, normal lipase; elevated lactic acid did improve with IV fluid boluses.  Surgery was consulted and recommended HIDA scan which was negative for cholecystitis.  As such, patient was offered future surgical management in the outpatient setting, which she agreed to.  Her pain did begin to improve while hospitalized, and she was able to tolerate a regular diet.  Pancytopenia Incidentally found to have true pancytopenia on admission.  Folate, B12 WNL.  Smear showed smudge cells, normal RBC and platelet morphology.  On repeat CBC throughout admission patient did have persistent pancytopenia.  Hematology oncology was consulted during hospitalization and they were relatively unconcerned given patient young, otherwise healthy.  Recommended follow-up evaluation in the outpatient setting.  Hypokalemia Initial K 3.2, then decreased to 2.9 despite repletion.  She continued to receive K repletion and was followed with serial BMPs.  Other chronic conditions were medically managed with home medications and formulary alternatives as necessary (HTN, anxiety, GERD)   PCP Follow-up Recommendations: Outpatient follow-up with heme-onc for pancytopenia workup. Outpatient follow-up with general surgery for possible cholecystectomy as indicated.

## 2024-01-26 NOTE — Assessment & Plan Note (Addendum)
 HIDA scan today, and likely lap chole with surgery. - Gen Surg consulted, appreciate recommendations. - Pain regimen: Tylenol  650 mg q6 PRN, oxycodone  5 mg q6 PRN for moderate pain, Dilaudid  0.5 mg q4 PRN for severe/breakthrough pain. - Home Phenergan 25 mg every 6 hours PRN for N/V - Follow up HIDA scan - AM CBC, CMP

## 2024-01-26 NOTE — Assessment & Plan Note (Addendum)
 HTN-Home amlodipine  5 mg daily Anxiety

## 2024-01-26 NOTE — Assessment & Plan Note (Addendum)
 Patient has remained afebrile, VSS overnight.  Nontoxic on exam.  Elevated lactic acid was likely secondary to volume depletion from many episodes of vomiting and poor PO intake in the setting of abdominal pain.  Doubt true sepsis. - Continue IV Zosyn  q8

## 2024-01-26 NOTE — Plan of Care (Signed)

## 2024-01-26 NOTE — Progress Notes (Signed)
 Progress Note     Subjective: Feeling better today. Has been NPO. Continues to have intermittent pain without discernible eliciting factors but slightly better from yesterday. No nausea/vomiting this am.   Objective: Vital signs in last 24 hours: Temp:  [98.3 F (36.8 C)-98.8 F (37.1 C)] 98.5 F (36.9 C) (04/29 0529) Pulse Rate:  [77-88] 88 (04/29 0529) Resp:  [16-18] 18 (04/29 0529) BP: (119-131)/(89-96) 119/95 (04/29 0529) SpO2:  [98 %-100 %] 99 % (04/29 0529) Last BM Date : 01/23/24  Intake/Output from previous day: 04/28 0701 - 04/29 0700 In: 1824.3 [P.O.:240; I.V.:441.9; IV Piggyback:1142.3] Out: -  Intake/Output this shift: No intake/output data recorded.  PE: General: pleasant, WD, female who is laying in bed in NAD Lungs: Respiratory effort nonlabored Abd: soft, ND, mild TTP in epigastrium and RUQ. Greater in epigastrium MSK: all 4 extremities are symmetrical with no cyanosis, clubbing, or edema. Skin: warm and dry Psych: A&Ox3 with an appropriate affect.    Lab Results:  Recent Labs    01/25/24 1347 01/26/24 0602  WBC 4.7 3.2*  HGB 9.0* 9.4*  HCT 26.6* 27.9*  PLT 115* 116*   BMET Recent Labs    01/25/24 0254 01/26/24 0602  NA 144 137  K 3.2* 2.9*  CL 101 97*  CO2 24 28  GLUCOSE 73 88  BUN <5* <5*  CREATININE 0.84 0.82  CALCIUM 8.4* 7.7*   PT/INR Recent Labs    01/25/24 0340  LABPROT 17.8*  INR 1.4*   CMP     Component Value Date/Time   NA 137 01/26/2024 0602   K 2.9 (L) 01/26/2024 0602   CL 97 (L) 01/26/2024 0602   CO2 28 01/26/2024 0602   GLUCOSE 88 01/26/2024 0602   BUN <5 (L) 01/26/2024 0602   CREATININE 0.82 01/26/2024 0602   CALCIUM 7.7 (L) 01/26/2024 0602   PROT 5.9 (L) 01/26/2024 0602   ALBUMIN 3.0 (L) 01/26/2024 0602   AST 140 (H) 01/26/2024 0602   ALT 47 (H) 01/26/2024 0602   ALKPHOS 64 01/26/2024 0602   BILITOT 1.4 (H) 01/26/2024 0602   GFRNONAA >60 01/26/2024 0602   GFRAA >60 01/09/2020 1030   Lipase      Component Value Date/Time   LIPASE 43 01/25/2024 0254       Studies/Results: US  Abdomen Limited Result Date: 01/25/2024 CLINICAL DATA:  Right upper quadrant pain EXAM: ULTRASOUND ABDOMEN LIMITED RIGHT UPPER QUADRANT COMPARISON:  10/06/2023 FINDINGS: Gallbladder: Dependent echoes attributed primarily to sludge given nonshadowing, admixed calculi given abdominal CT findings 11/17/2023. No gallbladder wall thickening or focal tenderness. Common bile duct: Diameter: 3 mm Liver: Very echogenic liver with poor acoustic penetration. No focal mass. Portal vein is patent on color Doppler imaging with normal direction of blood flow towards the liver. IMPRESSION: Gallbladder sludge and calculi without acute cholecystitis. Hepatic steatosis. Electronically Signed   By: Ronnette Coke M.D.   On: 01/25/2024 04:28   DG Chest Port 1 View Result Date: 01/25/2024 CLINICAL DATA:  Abdominal pain EXAM: PORTABLE CHEST 1 VIEW COMPARISON:  CT chest dated 10/04/2023 FINDINGS: Lungs are clear.  No pleural effusion or pneumothorax. The heart is normal in size. IMPRESSION: No acute cardiopulmonary disease. Electronically Signed   By: Zadie Herter M.D.   On: 01/25/2024 02:48    Anti-infectives: Anti-infectives (From admission, onward)    Start     Dose/Rate Route Frequency Ordered Stop   01/25/24 1400  piperacillin -tazobactam (ZOSYN ) IVPB 3.375 g        3.375  g 12.5 mL/hr over 240 Minutes Intravenous Every 8 hours 01/25/24 0755     01/25/24 0800  piperacillin -tazobactam (ZOSYN ) IVPB 3.375 g        3.375 g 100 mL/hr over 30 Minutes Intravenous  Once 01/25/24 0755 01/25/24 0845        Assessment/Plan Cholelithiasis, ?cholecystitis Pancytopenia Transaminitis Lactic acidosis    - AST/ALT improved. T bili slightly up to 1.4 from 1.2 - AF and abdominal exam stable to improved and reassuring. WBC remains low at 3.2 - HIDA pending - can have CLD today from surgical standpoint - She had significant  adhesions on dx lap in January and if HIDA positive may need to consider perc chole and interval appendectomy pending other work up.   FEN: NPO ID: [email or web address]  VTE: okay for chemical ppx from surgical standpoint  I reviewed hospitalist notes, last 24 h vitals and pain scores, last 48 h intake and output, last 24 h labs and trends, and last 24 h imaging results.    LOS: 1 day   Elwin Hammond, [facility] Surgery 01/26/2024, 10:04 AM Please see Amion for pager number during day hours 7:00am-4:30pm

## 2024-01-26 NOTE — Plan of Care (Addendum)
 Seen at bedside after message from nursing about shortness of breath.  Patient is afebrile.  She is not tachycardic.  SPO2 normal on room air.  Blood pressure stable though mildly hypertensive.  She states that her shortness of breath has been going on for about a month now.  She also notes some mild shaking that has happened for a long time, which I do appreciate mildly on exam.  Her shortness of breath, however, mostly happens when she is falling asleep.  It is not currently happening.  No chest pain.  This is not happened before a month ago.  No wheezing.  She does have a history of allergies and feels this could be part of it.  No increased swelling, pain, erythema of the bilateral lower extremities.  Less likely cardiac in nature, pneumonia, asthma, PE given the above history and exam. ? Hypnotic in nature with happening when going to sleep. Will add on daily Flonase  and Claritin for allergies.  Advised to let me know if she is not improving with this regimen.

## 2024-01-27 ENCOUNTER — Other Ambulatory Visit (HOSPITAL_COMMUNITY): Payer: Self-pay

## 2024-01-27 DIAGNOSIS — R1011 Right upper quadrant pain: Secondary | ICD-10-CM | POA: Diagnosis not present

## 2024-01-27 LAB — BASIC METABOLIC PANEL WITH GFR
Anion gap: 8 (ref 5–15)
BUN: <5 mg/dL — ABNORMAL LOW (ref 6–20)
CO2: 28 mmol/L (ref 22–32)
Calcium: 8.2 mg/dL — ABNORMAL LOW (ref 8.9–10.3)
Chloride: 103 mmol/L (ref 98–111)
Creatinine, Ser: 0.76 mg/dL (ref 0.44–1.00)
GFR, Estimated: >60 mL/min (ref >60)
Glucose, Bld: 96 mg/dL (ref 70–99)
Potassium: 3.7 mmol/L (ref 3.5–5.1)
Sodium: 139 mmol/L (ref 135–145)

## 2024-01-27 LAB — HEPATIC FUNCTION PANEL
ALT: 41 U/L (ref 0–44)
AST: 96 U/L — ABNORMAL HIGH (ref 15–41)
Albumin: 3.4 g/dL — ABNORMAL LOW (ref 3.5–5.0)
Alkaline Phosphatase: 70 U/L (ref 38–126)
Bilirubin, Direct: 0.3 mg/dL — ABNORMAL HIGH (ref 0.0–0.2)
Indirect Bilirubin: 1 mg/dL — ABNORMAL HIGH (ref 0.3–0.9)
Total Bilirubin: 1.3 mg/dL — ABNORMAL HIGH (ref 0.0–1.2)
Total Protein: 6.6 g/dL (ref 6.5–8.1)

## 2024-01-27 LAB — CBC
HCT: 31 % — ABNORMAL LOW (ref 36.0–46.0)
Hemoglobin: 10.3 g/dL — ABNORMAL LOW (ref 12.0–15.0)
MCH: 33.9 pg (ref 26.0–34.0)
MCHC: 33.2 g/dL (ref 30.0–36.0)
MCV: 102 fL — ABNORMAL HIGH (ref 80.0–100.0)
Platelets: 122 10*3/uL — ABNORMAL LOW (ref 150–400)
RBC: 3.04 MIL/uL — ABNORMAL LOW (ref 3.87–5.11)
RDW: 17.9 % — ABNORMAL HIGH (ref 11.5–15.5)
WBC: 4.4 10*3/uL (ref 4.0–10.5)
nRBC: 0.9 % — ABNORMAL HIGH (ref 0.0–0.2)

## 2024-01-27 LAB — SURGICAL PCR SCREEN
MRSA, PCR: NEGATIVE
Staphylococcus aureus: NEGATIVE

## 2024-01-27 MED ORDER — OXYCODONE HCL 5 MG PO TABS
5.0000 mg | ORAL_TABLET | Freq: Four times a day (QID) | ORAL | 0 refills | Status: DC | PRN
  Filled 2024-01-27: qty 4, 1d supply, fill #0

## 2024-01-27 NOTE — Progress Notes (Signed)
 Progress Note     Subjective: Started having more central/periumbilical abdominal pain yesterday evening after eating. Pain is sharp and feels the same as the epigastric/RUQ pain she has been having. No PO so far this am. She denies n/v. Passing flatus. No BM   Objective: Vital signs in last 24 hours: Temp:  [98.2 F (36.8 C)-98.7 F (37.1 C)] 98.2 F (36.8 C) (04/30 0624) Pulse Rate:  [78-105] 78 (04/30 0624) Resp:  [17-18] 18 (04/30 0624) BP: (120-123)/(91-96) 123/91 (04/30 0624) SpO2:  [100 %] 100 % (04/30 0624) Last BM Date : 01/23/24  Intake/Output from previous day: 04/29 0701 - 04/30 0700 In: 1073.3 [P.O.:480; I.V.:493.2; IV Piggyback:100] Out: -  Intake/Output this shift: No intake/output data recorded.  PE: General: pleasant, WD, female who is laying in bed in NAD Lungs: Respiratory effort nonlabored Abd: soft, ND, very mild TTP in epigastrium and RUQ. Mild periumbilical TTP  No peritonitis MSK: all 4 extremities are symmetrical with no cyanosis, clubbing, or edema. Skin: warm and dry Psych: A&Ox3 with an appropriate affect.    Lab Results:  Recent Labs    01/25/24 1347 01/26/24 0602  WBC 4.7 3.2*  HGB 9.0* 9.4*  HCT 26.6* 27.9*  PLT 115* 116*   BMET Recent Labs    01/26/24 0602 01/26/24 1426  NA 137 137  K 2.9* 3.3*  CL 97* 95*  CO2 28 28  GLUCOSE 88 103*  BUN <5* <5*  CREATININE 0.82 0.81  CALCIUM 7.7* 8.0*   PT/INR Recent Labs    01/25/24 0340  LABPROT 17.8*  INR 1.4*   CMP     Component Value Date/Time   NA 137 01/26/2024 1426   K 3.3 (L) 01/26/2024 1426   CL 95 (L) 01/26/2024 1426   CO2 28 01/26/2024 1426   GLUCOSE 103 (H) 01/26/2024 1426   BUN <5 (L) 01/26/2024 1426   CREATININE 0.81 01/26/2024 1426   CALCIUM 8.0 (L) 01/26/2024 1426   PROT 5.9 (L) 01/26/2024 0602   ALBUMIN 3.0 (L) 01/26/2024 0602   AST 140 (H) 01/26/2024 0602   ALT 47 (H) 01/26/2024 0602   ALKPHOS 64 01/26/2024 0602   BILITOT 1.4 (H) 01/26/2024 0602    GFRNONAA >60 01/26/2024 1426   GFRAA >60 01/09/2020 1030   Lipase     Component Value Date/Time   LIPASE 43 01/25/2024 0254       Studies/Results: NM Hepatobiliary Liver Func Result Date: 01/26/2024 CLINICAL DATA:  Right upper quadrant abdominal pain EXAM: NUCLEAR MEDICINE HEPATOBILIARY IMAGING TECHNIQUE: Sequential images of the abdomen were obtained out to 60 minutes following intravenous administration of radiopharmaceutical. RADIOPHARMACEUTICALS:  5.3 mCi Tc-38m  Choletec IV COMPARISON:  Ultrasound January 25, 2024 FINDINGS: Prompt uptake and biliary excretion of activity by the liver is seen. Gallbladder activity is visualized, consistent with patency of cystic duct. Biliary activity passes into small bowel, consistent with patent common bile duct. IMPRESSION: No scintigraphic evidence of acute cholecystitis. Patent cystic and common bile ducts. Electronically Signed   By: Tama Fails M.D.   On: 01/26/2024 14:11    Anti-infectives: Anti-infectives (From admission, onward)    Start     Dose/Rate Route Frequency Ordered Stop   01/25/24 1400  piperacillin -tazobactam (ZOSYN ) IVPB 3.375 g  Status:  Discontinued        3.375 g 12.5 mL/hr over 240 Minutes Intravenous Every 8 hours 01/25/24 0755 01/26/24 1620   01/25/24 0800  piperacillin -tazobactam (ZOSYN ) IVPB 3.375 g  3.375 g 100 mL/hr over 30 Minutes Intravenous  Once 01/25/24 0755 01/25/24 0845        Assessment/Plan Cholelithiasis Pancytopenia Transaminitis Lactic acidosis    - AST/ALT improved yesterday. T bili slightly up to 1.4 from 1.2 yesterday. Labs pending today - AF, VSS and abdominal exam stable and reassuring. New periumbilical pain but mild. No peritonitis. WBC up to 4.4 today - HIDA negative for acute cholecystitis but likely having symptomatic cholelithiasis and therefore could ultimately benefit from elective lap chole - restart CLD this am and if tolerating can advance as tolerated to [facility] -  ultimately could plan for elective lap chole during this admission but no OR availability today and unfortunately cannot guarantee surgery tomorrow. She is eager to discharge as early as today if medically stable to do so and pursue outpatient surgery rather than waiting inpatient. From surgical standpoint could discharge if she tolerates diet advancement with stable to improved pain. If she cannot tolerate diet advancement she may need further imaging such as CT given new periumbilical pain.   FEN: CLD ADAT HH ID: [email or web address]  stopped VTE: lovenox   I reviewed hospitalist notes, last 24 h vitals and pain scores, last 48 h intake and output, last 24 h labs and trends, and last 24 h imaging results.    LOS: 2 days   Elwin Hammond, [facility] Surgery 01/27/2024, 7:31 AM Please see Amion for pager number during day hours 7:00am-4:30pm

## 2024-01-27 NOTE — Assessment & Plan Note (Deleted)
 HTN-Home amlodipine  5 mg daily Anxiety

## 2024-01-27 NOTE — Assessment & Plan Note (Deleted)
 HIDA scan yesterday 4/29 negative.  Suspect biliary colic.  Per surgery, patient may need lap chole during this admission versus early outpatient follow-up following discharge.  This morning the patient's pain is***. - Gen Surg consulted, appreciate recommendations. - Pain regimen: Tylenol  650 mg q6 PRN, oxycodone  5 mg q6 PRN for moderate pain, Dilaudid  0.5 mg q4 PRN for severe/breakthrough pain. - Home Phenergan 25 mg every 6 hours PRN for N/V - AM CBC, CMP

## 2024-01-27 NOTE — Plan of Care (Signed)

## 2024-01-27 NOTE — Discharge Summary (Addendum)
 FMTS Attending Daily Note: Melissa Bob, MD  Team Pager [phone] Pager [phone]  I have reviewed the below documentation, discussed the patient with resident, and reviewed the assessment and plan as documented in the residents note. I have personally seen the patient on the day of discharge and agree with the below exam.  Time spent planning discharge: 21 minutes In addition to below, I recommend the following at follow up:  Tolerating PO, abd soft without guarding or rebound. Agree with outpatient repeat CBC and smear and heme follow up if pancytopenia not resolved.     Family Medicine Teaching [facility] Discharge Summary  Patient name: Melissa Guzman Medical record number: [id number] Date of birth: 07-04-1996 Age: 28 y.o. Gender: female Date of Admission: 01/25/2024  Date of Discharge: 01/27/2024 Admitting Physician: Charmel Cooter, MD  Primary Care Provider: [facility] Medical Consultants: General Surgery  Indication for Hospitalization: RUQ abdominal pain suspicious for cholecystitis  Brief Hospital Course:  Melissa Guzman is a 28 y.o.female with a history of HTN, GERD, gallstone pancreatitis s/p attempted cholecystectomy in Jan 2025 who was admitted to the [facility] Medicine Teaching Service at [facility] for RUQ abdominal pain suspicious for cholecystitis.   Her hospital course is detailed below:  Cholelithiasis with Biliary Colic Patient initially presented to the ED with significant abdominal pain and 1 day of nausea and vomiting.  Labs revealed mild transaminitis, normal lipase; elevated lactic acid did improve with IV fluid boluses.  Surgery was consulted and recommended HIDA scan which was negative for cholecystitis.  As such, patient was offered future surgical management in the outpatient setting, which she agreed to.  Her pain did begin to improve while hospitalized, and she was able to tolerate a regular diet.  Pancytopenia Incidentally found to  have true pancytopenia on admission.  Folate, B12 WNL.  Smear showed smudge cells, normal RBC and platelet morphology.  On repeat CBC throughout admission patient did have persistent pancytopenia.  Hematology oncology was consulted during hospitalization and they were relatively unconcerned given patient young, otherwise healthy.  Recommended follow-up evaluation in the outpatient setting.  Hypokalemia Initial K 3.2, then decreased to 2.9 despite repletion.  She continued to receive K repletion and was followed with serial BMPs.  Other chronic conditions were medically managed with home medications and formulary alternatives as necessary (HTN, anxiety, GERD)   PCP Follow-up Recommendations: Outpatient follow-up with heme-onc for pancytopenia workup. Outpatient follow-up with general surgery for possible cholecystectomy as indicated.   Discharge Diagnoses/Problem List:  Principal Problem:   RUQ abdominal pain Active Problems:   Essential hypertension   Hypokalemia   Sepsis (HCC)   Pancytopenia (HCC)   Cholelithiasis   Chronic health problem  Disposition: Home  Discharge Condition: Stable  Discharge Exam:  General: Walking around the room comfortably, NAD Cardiovascular: RRR, no murmurs Respiratory: Normal work of breathing on room air Abdomen: Normoactive bowel sounds, soft, mildly tender in epigastric region.  No hepatosplenomegaly. Extremities: Moves all equally.  Significant Procedures: None  Significant Labs and Imaging:  Recent Labs  Lab 01/26/24 0602 01/27/24 0646  WBC 3.2* 4.4  HGB 9.4* 10.3*  HCT 27.9* 31.0*  PLT 116* 122*   Recent Labs  Lab 01/26/24 0602 01/26/24 1426 01/27/24 0646  NA 137 137 139  K 2.9* 3.3* 3.7  CL 97* 95* 103  CO2 28 28 28   GLUCOSE 88 103* 96  BUN <5* <5* <5*  CREATININE 0.82 0.81 0.76  CALCIUM 7.7* 8.0* 8.2*  ALKPHOS 64  --  70  AST 140*  --  96*  ALT 47*  --  41  ALBUMIN 3.0*  --  3.4*   4/28 RUQ  US : IMPRESSION: Gallbladder sludge and calculi without acute cholecystitis. Hepatic steatosis.  4/28 CXR: IMPRESSION: No acute cardiopulmonary disease.  4/29 HIDA scan: IMPRESSION: No scintigraphic evidence of acute cholecystitis. Patent cystic and common bile ducts.  Results/Tests Pending at Time of Discharge: none  Discharge Medications:  Allergies as of 01/27/2024   No Known Allergies      Medication List     STOP taking these medications    methocarbamol  500 MG tablet Commonly known as: ROBAXIN        TAKE these medications    Acetaminophen  Extra Strength 500 MG Tabs Take 2 tablets (1,000 mg total) by mouth every 8 (eight) hours as needed (pain).   amLODipine  5 MG tablet Commonly known as: NORVASC  Take 1 tablet (5 mg total) by mouth daily.   oxyCODONE  5 MG immediate release tablet Commonly known as: Oxy IR/ROXICODONE  Take 1 tablet (5 mg total) by mouth every 6 (six) hours as needed for up to 4 doses for moderate pain (pain score 4-6) or severe pain (pain score 7-10).   pantoprazole  40 MG tablet Commonly known as: PROTONIX  Take 1 tablet (40 mg total) by mouth daily.   promethazine 25 MG tablet Commonly known as: PHENERGAN Take 25 mg by mouth every 6 (six) hours as needed.        Discharge Instructions: Please refer to Patient Instructions section of EMR for full details.  Patient was counseled important Guzman and symptoms that should prompt return to medical care, changes in medications, dietary instructions, activity restrictions, and follow up appointments.   Follow-Up Appointments:  Follow-up Information     Cannon Champion, MD. Call.   Specialty: General Surgery Why: We are making a follow up appointment for you., Please call to confirm appointment time., Arrive 30 minutes early to complete check in, and bring photo ID and insurance card. Contact information: [address] [place] [address] [phone]                  Charmel Cooter, MD 01/27/2024, 2:29 PM PGY-1, [facility] Family [facility]

## 2024-01-27 NOTE — Assessment & Plan Note (Deleted)
 Folate, B12 WNL.  Smear with smudge cells, normal RBC and platelet morphology.  Persistent pancytopenia CBC this AM.  Spoke with Dr. Arno Bibles, HemeOnc, recommendations below. - Trend with daily CBC - Heme-onc recommends outpatient follow-up for further workup

## 2024-01-27 NOTE — Plan of Care (Signed)
  Problem: Pain Managment: Goal: General experience of comfort will improve and/or be controlled Outcome: Progressing   Problem: Safety: Goal: Ability to remain free from injury will improve Outcome: Progressing

## 2024-01-27 NOTE — Discharge Instructions (Signed)
 Dear Melissa Guzman,   Thank you for letting us  participate in your care!  We are glad you are feeling better.  You are hospitalized with concern for gallbladder problems due to your abdominal pain.  Fortunately the scans that you had in the hospital were negative for any conditions which would require emergency surgery.  The stomach doctors saw you and recommended surgery outside of the hospital.   POST-HOSPITAL & CARE INSTRUCTIONS We recommend following up with your PCP within 1 week from being discharged from the hospital. Please let PCP/Specialists know of any changes in medications that were made which you will be able to see in the medications section of this packet. Please also follow up the GI (stomach) doctors for planning future surgery if needed.  DOCTOR'S APPOINTMENTS & FOLLOW UP No future appointments.   Thank you for choosing [facility]! Take care and be well!  Family Medicine Teaching Service Inpatient Team [place]  [facility]  [address] [place], [place] [address] [phone]

## 2024-01-27 NOTE — Assessment & Plan Note (Deleted)
***    K 2.9.  Repleted with 40 mEq. - replete with 40meq - PM BMP to trend, additional repletion as necessary - AM BMP

## 2024-01-27 NOTE — Assessment & Plan Note (Deleted)
***    Patient has remained afebrile, VSS overnight.  Nontoxic on exam.  Elevated lactic acid was likely secondary to volume depletion from many episodes of vomiting and poor PO intake in the setting of abdominal pain.  Doubt true sepsis. - Continue IV Zosyn  q8 ***

## 2024-01-30 LAB — CULTURE, BLOOD (ROUTINE X 2)
Culture: NO GROWTH
Culture: NO GROWTH
Special Requests: ADEQUATE
Special Requests: ADEQUATE

## 2024-02-11 ENCOUNTER — Emergency Department (HOSPITAL_BASED_OUTPATIENT_CLINIC_OR_DEPARTMENT_OTHER)

## 2024-02-11 ENCOUNTER — Encounter (HOSPITAL_BASED_OUTPATIENT_CLINIC_OR_DEPARTMENT_OTHER): Payer: Self-pay

## 2024-02-11 ENCOUNTER — Inpatient Hospital Stay (HOSPITAL_BASED_OUTPATIENT_CLINIC_OR_DEPARTMENT_OTHER)
Admission: EM | Admit: 2024-02-11 | Discharge: 2024-02-18 | DRG: 438 | Disposition: A | Discharge status: Home / Self Care | Attending: Family Medicine | Admitting: Family Medicine

## 2024-02-11 ENCOUNTER — Other Ambulatory Visit: Payer: Self-pay

## 2024-02-11 DIAGNOSIS — R079 Chest pain, unspecified: Secondary | ICD-10-CM | POA: Diagnosis not present

## 2024-02-11 DIAGNOSIS — R7989 Other specified abnormal findings of blood chemistry: Secondary | ICD-10-CM | POA: Diagnosis not present

## 2024-02-11 DIAGNOSIS — Z8616 Personal history of COVID-19: Secondary | ICD-10-CM

## 2024-02-11 DIAGNOSIS — K7581 Nonalcoholic steatohepatitis (NASH): Secondary | ICD-10-CM | POA: Diagnosis present

## 2024-02-11 DIAGNOSIS — E878 Other disorders of electrolyte and fluid balance, not elsewhere classified: Secondary | ICD-10-CM | POA: Diagnosis present

## 2024-02-11 DIAGNOSIS — I513 Intracardiac thrombosis, not elsewhere classified: Secondary | ICD-10-CM

## 2024-02-11 DIAGNOSIS — I5021 Acute systolic (congestive) heart failure: Secondary | ICD-10-CM | POA: Diagnosis not present

## 2024-02-11 DIAGNOSIS — Z79899 Other long term (current) drug therapy: Secondary | ICD-10-CM

## 2024-02-11 DIAGNOSIS — Z98891 History of uterine scar from previous surgery: Secondary | ICD-10-CM

## 2024-02-11 DIAGNOSIS — I5041 Acute combined systolic (congestive) and diastolic (congestive) heart failure: Secondary | ICD-10-CM

## 2024-02-11 DIAGNOSIS — E876 Hypokalemia: Secondary | ICD-10-CM | POA: Diagnosis present

## 2024-02-11 DIAGNOSIS — E872 Acidosis, unspecified: Secondary | ICD-10-CM | POA: Diagnosis present

## 2024-02-11 DIAGNOSIS — Z789 Other specified health status: Secondary | ICD-10-CM

## 2024-02-11 DIAGNOSIS — F419 Anxiety disorder, unspecified: Secondary | ICD-10-CM | POA: Diagnosis present

## 2024-02-11 DIAGNOSIS — R748 Abnormal levels of other serum enzymes: Secondary | ICD-10-CM | POA: Diagnosis present

## 2024-02-11 DIAGNOSIS — D61818 Other pancytopenia: Secondary | ICD-10-CM | POA: Diagnosis present

## 2024-02-11 DIAGNOSIS — K859 Acute pancreatitis without necrosis or infection, unspecified: Secondary | ICD-10-CM | POA: Diagnosis present

## 2024-02-11 DIAGNOSIS — D539 Nutritional anemia, unspecified: Secondary | ICD-10-CM | POA: Diagnosis present

## 2024-02-11 DIAGNOSIS — K219 Gastro-esophageal reflux disease without esophagitis: Secondary | ICD-10-CM | POA: Diagnosis present

## 2024-02-11 DIAGNOSIS — N3090 Cystitis, unspecified without hematuria: Secondary | ICD-10-CM | POA: Diagnosis present

## 2024-02-11 DIAGNOSIS — R9431 Abnormal electrocardiogram [ECG] [EKG]: Secondary | ICD-10-CM | POA: Diagnosis present

## 2024-02-11 DIAGNOSIS — I2510 Atherosclerotic heart disease of native coronary artery without angina pectoris: Secondary | ICD-10-CM | POA: Diagnosis present

## 2024-02-11 DIAGNOSIS — I5181 Takotsubo syndrome: Secondary | ICD-10-CM | POA: Diagnosis not present

## 2024-02-11 DIAGNOSIS — D7589 Other specified diseases of blood and blood-forming organs: Secondary | ICD-10-CM | POA: Diagnosis present

## 2024-02-11 DIAGNOSIS — R7401 Elevation of levels of liver transaminase levels: Secondary | ICD-10-CM

## 2024-02-11 DIAGNOSIS — K851 Biliary acute pancreatitis without necrosis or infection: Principal | ICD-10-CM | POA: Diagnosis present

## 2024-02-11 DIAGNOSIS — R300 Dysuria: Secondary | ICD-10-CM | POA: Insufficient documentation

## 2024-02-11 LAB — COMPREHENSIVE METABOLIC PANEL WITH GFR
ALT: 73 U/L — ABNORMAL HIGH (ref 0–44)
AST: 360 U/L — ABNORMAL HIGH (ref 15–41)
Albumin: 4.7 g/dL (ref 3.5–5.0)
Alkaline Phosphatase: 134 U/L — ABNORMAL HIGH (ref 38–126)
Anion gap: 30 — ABNORMAL HIGH (ref 5–15)
BUN: 6 mg/dL (ref 6–20)
CO2: 22 mmol/L (ref 22–32)
Calcium: 10 mg/dL (ref 8.9–10.3)
Chloride: 92 mmol/L — ABNORMAL LOW (ref 98–111)
Creatinine, Ser: 0.91 mg/dL (ref 0.44–1.00)
GFR, Estimated: >60 mL/min (ref >60)
Glucose, Bld: 73 mg/dL (ref 70–99)
Potassium: 3.1 mmol/L — ABNORMAL LOW (ref 3.5–5.1)
Sodium: 145 mmol/L (ref 135–145)
Total Bilirubin: 1.3 mg/dL — ABNORMAL HIGH (ref 0.0–1.2)
Total Protein: 8.4 g/dL — ABNORMAL HIGH (ref 6.5–8.1)

## 2024-02-11 LAB — I-STAT VENOUS BLOOD GAS, ED
Acid-base deficit: 4 mmol/L — ABNORMAL HIGH (ref 0.0–2.0)
Bicarbonate: 21.4 mmol/L (ref 20.0–28.0)
Calcium, Ion: 1.19 mmol/L (ref 1.15–1.40)
HCT: 35 % — ABNORMAL LOW (ref 36.0–46.0)
Hemoglobin: 11.9 g/dL — ABNORMAL LOW (ref 12.0–15.0)
O2 Saturation: 93 %
Patient temperature: 98.2
Potassium: 3.3 mmol/L — ABNORMAL LOW (ref 3.5–5.1)
Sodium: 137 mmol/L (ref 135–145)
TCO2: 23 mmol/L (ref 22–32)
pCO2, Ven: 37.9 mmHg — ABNORMAL LOW (ref 44–60)
pH, Ven: 7.359 (ref 7.25–7.43)
pO2, Ven: 70 mmHg — ABNORMAL HIGH (ref 32–45)

## 2024-02-11 LAB — URINALYSIS, ROUTINE W REFLEX MICROSCOPIC
Bilirubin Urine: NEGATIVE
Glucose, UA: NEGATIVE mg/dL
Ketones, ur: >80 mg/dL — AB
Leukocytes,Ua: NEGATIVE
Nitrite: NEGATIVE
Protein, ur: >300 mg/dL — AB
Specific Gravity, Urine: 1.025 (ref 1.005–1.030)
pH: 6 (ref 5.0–8.0)

## 2024-02-11 LAB — CBC
HCT: 35.7 % — ABNORMAL LOW (ref 36.0–46.0)
Hemoglobin: 11.8 g/dL — ABNORMAL LOW (ref 12.0–15.0)
MCH: 33.7 pg (ref 26.0–34.0)
MCHC: 33.1 g/dL (ref 30.0–36.0)
MCV: 102 fL — ABNORMAL HIGH (ref 80.0–100.0)
Platelets: 127 10*3/uL — ABNORMAL LOW (ref 150–400)
RBC: 3.5 MIL/uL — ABNORMAL LOW (ref 3.87–5.11)
RDW: 14.9 % (ref 11.5–15.5)
WBC: 5.1 10*3/uL (ref 4.0–10.5)
nRBC: 0 % (ref 0.0–0.2)

## 2024-02-11 LAB — URINE DRUG SCREEN
Amphetamines: NOT DETECTED
Barbiturates: NOT DETECTED
Benzodiazepines: NOT DETECTED
Cocaine: NOT DETECTED
Fentanyl: NOT DETECTED
Methadone Scn, Ur: NOT DETECTED
Opiates: DETECTED — AB
Tetrahydrocannabinol: NOT DETECTED

## 2024-02-11 LAB — HEPATITIS PANEL, ACUTE
HCV Ab: NONREACTIVE
Hep A IgM: NONREACTIVE
Hep B C IgM: NONREACTIVE
Hepatitis B Surface Ag: NONREACTIVE

## 2024-02-11 LAB — GLUCOSE, CAPILLARY: Glucose-Capillary: 54 mg/dL — ABNORMAL LOW (ref 70–99)

## 2024-02-11 LAB — LIPASE, BLOOD: Lipase: 60 U/L — ABNORMAL HIGH (ref 11–51)

## 2024-02-11 LAB — GROUP A STREP BY PCR: Group A Strep by PCR: NOT DETECTED

## 2024-02-11 LAB — PREGNANCY, URINE: Preg Test, Ur: NEGATIVE

## 2024-02-11 LAB — LACTIC ACID, PLASMA
Lactic Acid, Venous: 1.2 mmol/L (ref 0.5–1.9)
Lactic Acid, Venous: 1.2 mmol/L (ref 0.5–1.9)

## 2024-02-11 LAB — CBG MONITORING, ED
Glucose-Capillary: 67 mg/dL — ABNORMAL LOW (ref 70–99)
Glucose-Capillary: 128 mg/dL — ABNORMAL HIGH (ref 70–99)

## 2024-02-11 LAB — MAGNESIUM: Magnesium: 1.6 mg/dL — ABNORMAL LOW (ref 1.7–2.4)

## 2024-02-11 MED ORDER — HYDROMORPHONE HCL 1 MG/ML IJ SOLN
0.5000 mg | Freq: Four times a day (QID) | INTRAMUSCULAR | Status: DC | PRN
  Administered 2024-02-11 – 2024-02-13 (×4): 0.5 mg via INTRAVENOUS
  Filled 2024-02-11 (×4): qty 0.5

## 2024-02-11 MED ORDER — AMLODIPINE BESYLATE 10 MG PO TABS
5.0000 mg | ORAL_TABLET | Freq: Every day | ORAL | Status: DC
  Administered 2024-02-12 – 2024-02-14 (×3): 5 mg via ORAL
  Filled 2024-02-11 (×3): qty 1

## 2024-02-11 MED ORDER — MAGNESIUM OXIDE -MG SUPPLEMENT 400 (240 MG) MG PO TABS
400.0000 mg | ORAL_TABLET | Freq: Once | ORAL | Status: AC
  Administered 2024-02-11: 400 mg via ORAL
  Filled 2024-02-11: qty 1

## 2024-02-11 MED ORDER — ACETAMINOPHEN 650 MG RE SUPP: 650.0000 mg | Freq: Four times a day (QID) | RECTAL | Status: DC | PRN

## 2024-02-11 MED ORDER — IOHEXOL 300 MG/ML  SOLN
100.0000 mL | Freq: Once | INTRAMUSCULAR | Status: AC | PRN
  Administered 2024-02-11: 60 mL via INTRAVENOUS

## 2024-02-11 MED ORDER — SENNOSIDES-DOCUSATE SODIUM 8.6-50 MG PO TABS: 1.0000 | ORAL_TABLET | Freq: Every evening | ORAL | Status: DC | PRN

## 2024-02-11 MED ORDER — ENOXAPARIN SODIUM 40 MG/0.4ML IJ SOSY
40.0000 mg | PREFILLED_SYRINGE | INTRAMUSCULAR | Status: DC
Start: 2024-02-12 — End: 2024-02-13
  Administered 2024-02-12 – 2024-02-13 (×2): 40 mg via SUBCUTANEOUS
  Filled 2024-02-11 (×2): qty 0.4

## 2024-02-11 MED ORDER — INSULIN ASPART 100 UNIT/ML IJ SOLN
0.0000 [IU] | INTRAMUSCULAR | Status: DC
  Administered 2024-02-12: 2 [IU] via SUBCUTANEOUS
  Administered 2024-02-12 (×2): 1 [IU] via SUBCUTANEOUS

## 2024-02-11 MED ORDER — DEXTROSE 50 % IV SOLN
1.0000 | Freq: Once | INTRAVENOUS | Status: AC
  Administered 2024-02-11: 50 mL via INTRAVENOUS
  Filled 2024-02-11: qty 50

## 2024-02-11 MED ORDER — LACTATED RINGERS IV SOLN: INTRAVENOUS | Status: DC

## 2024-02-11 MED ORDER — POTASSIUM CHLORIDE 10 MEQ/100ML IV SOLN
10.0000 meq | INTRAVENOUS | Status: AC
  Administered 2024-02-11 (×2): 10 meq via INTRAVENOUS
  Filled 2024-02-11 (×3): qty 100

## 2024-02-11 MED ORDER — PANTOPRAZOLE SODIUM 40 MG PO TBEC
40.0000 mg | DELAYED_RELEASE_TABLET | Freq: Every day | ORAL | Status: DC
  Administered 2024-02-12 – 2024-02-18 (×7): 40 mg via ORAL
  Filled 2024-02-11 (×7): qty 1

## 2024-02-11 MED ORDER — FENTANYL CITRATE PF 50 MCG/ML IJ SOSY
25.0000 ug | PREFILLED_SYRINGE | Freq: Once | INTRAMUSCULAR | Status: AC
  Administered 2024-02-11: 25 ug via INTRAVENOUS
  Filled 2024-02-11: qty 1

## 2024-02-11 MED ORDER — MORPHINE SULFATE (PF) 4 MG/ML IV SOLN
4.0000 mg | Freq: Once | INTRAVENOUS | Status: AC
  Administered 2024-02-11: 4 mg via INTRAVENOUS
  Filled 2024-02-11: qty 1

## 2024-02-11 MED ORDER — ONDANSETRON HCL 4 MG/2ML IJ SOLN
4.0000 mg | Freq: Four times a day (QID) | INTRAMUSCULAR | Status: DC | PRN
  Administered 2024-02-12 – 2024-02-13 (×2): 4 mg via INTRAVENOUS
  Filled 2024-02-11 (×2): qty 2

## 2024-02-11 MED ORDER — ONDANSETRON HCL 4 MG/2ML IJ SOLN
4.0000 mg | Freq: Once | INTRAMUSCULAR | Status: AC
  Administered 2024-02-11: 4 mg via INTRAVENOUS
  Filled 2024-02-11: qty 2

## 2024-02-11 MED ORDER — DEXTROSE 50 % IV SOLN
50.0000 mL | Freq: Once | INTRAVENOUS | Status: AC
  Administered 2024-02-11: 50 mL via INTRAVENOUS
  Filled 2024-02-11: qty 50

## 2024-02-11 MED ORDER — LACTATED RINGERS IV BOLUS
2000.0000 mL | Freq: Once | INTRAVENOUS | Status: AC
  Administered 2024-02-11: 2000 mL via INTRAVENOUS

## 2024-02-11 MED ORDER — ONDANSETRON HCL 4 MG PO TABS: 4.0000 mg | ORAL_TABLET | Freq: Four times a day (QID) | ORAL | Status: DC | PRN

## 2024-02-11 MED ORDER — POTASSIUM CHLORIDE 10 MEQ/100ML IV SOLN
10.0000 meq | INTRAVENOUS | Status: AC
  Administered 2024-02-11 – 2024-02-12 (×3): 10 meq via INTRAVENOUS
  Filled 2024-02-11 (×3): qty 100

## 2024-02-11 MED ORDER — ACETAMINOPHEN 325 MG PO TABS
650.0000 mg | ORAL_TABLET | Freq: Four times a day (QID) | ORAL | Status: DC | PRN
  Administered 2024-02-12 – 2024-02-17 (×4): 650 mg via ORAL
  Filled 2024-02-11 (×3): qty 2

## 2024-02-11 MED ORDER — LACTATED RINGERS IV BOLUS: 1000.0000 mL | Freq: Once | INTRAVENOUS | Status: DC

## 2024-02-11 NOTE — ED Notes (Signed)
 Patient attempted to obtain a urine sample, and was unsuccessful. Patient states she is worried she has a UTI.

## 2024-02-11 NOTE — H&P (Signed)
 History and Physical  Melissa Guzman NWG:[id number] DOB: 1995/12/03 DOA: 02/11/2024  PCP: [facility]   Chief Complaint: Persistent abdominal pain, nausea and vomiting  HPI: Melissa Guzman is a 28 y.o. female with medical history significant for anxiety, HTN, GERD, gallstone pancreatitis s/p attempted cholecystectomy aborted due to adhesions in Jan 2025, hepatic steatosis who presented to [facility] for recurrent abdominal pain, nausea and vomiting. Patient admitted diagnosis: 4/28 2/30 for similar presentation. She reports improvement in her abdominal symptoms for 3 to 4 days however the symptoms worsened after that.  Over the last 2 weeks, she has had more than 20 episodes of nonbloody nonbilious emesis.  She has had epigastric and lower abdominal pain that sometimes radiates to her back.  She has had associated shortness of breath but denies any fevers, chills, dysuria, hematuria, headache or weakness.  Patient is scheduled for cholecystectomy on June 6.  ED Course: Initial vitals showed patient afebrile, tachycardic with HR in the 90s and mildly hypertensive with SBP in the 140s.  Labs significant for K+ 3.1->3.3, lipase 60, AST/ALT 360/73, bilirubin 1.3, alk phos 134, WBC 5.1, Hgb 11.8, platelet 127, mag 1.6, lactic acid 1.2, CBG 67->54, negative acute hepatitis panel, negative group A strep, UDS positive for opioids, UA shows significant ketonuria and proteinuria but no signs of infection. RUQ U/S shows gallbladder sludge and hepatic steatosis but no evidence of acute cholecystitis.  CT A/P shows evidence of acute pancreatitis.  Patient received IV fluids, IV morphine , IV fentanyl , magnesium  oxide and amp of D50 x 2.  Patient was admitted to [facility] service and transferred to [facility].  Review of Systems: Please see HPI for pertinent positives and negatives. A complete 10 system review of systems are otherwise negative.  Past Medical History:  Diagnosis Date    Acute blood loss anemia 03/01/2022   Anemia    Anxiety    COVID-19 affecting pregnancy in third trimester 02/04/2022   +01/28/2022   Endometritis 03/05/2022   Hypertension    Postpartum septic endometritis 03/09/2022   UTI (urinary tract infection) 05/26/2023   Past Surgical History:  Procedure Laterality Date   CESAREAN SECTION N/A 03/01/2022   Procedure: CESAREAN SECTION;  Surgeon: Othelia Blinks, MD;  Location: [facility] LD ORS;  Service: Obstetrics;  Laterality: N/A;   DIAGNOSTIC LAPAROSCOPIC LIVER BIOPSY N/A 10/09/2023   Procedure: DIAGNOSTIC LAPAROSCOPY;  Surgeon: Cannon Champion, MD;  Location: [facility] OR;  Service: General;  Laterality: N/A;   IR RADIOLOGIST EVAL & MGMT  03/28/2022   IR RADIOLOGIST EVAL & MGMT  04/10/2022   Social History:  reports that she has never smoked. She has never used smokeless tobacco. She reports that she does not currently use alcohol. She reports current drug use. Frequency: 3.00 times per week. Drug: Marijuana.  No Known Allergies  History reviewed. No pertinent family history.   Prior to Admission medications   Medication Sig Start Date End Date Taking? Authorizing Provider  acetaminophen  (TYLENOL ) 500 MG tablet Take 2 tablets (1,000 mg total) by mouth every 8 (eight) hours as needed (pain). 03/05/22  Yes Rosalita Combe, MD  amLODipine  (NORVASC ) 5 MG tablet Take 1 tablet (5 mg total) by mouth daily. 05/29/23 01/24/25 Yes Vada Garibaldi, MD  ibuprofen  (ADVIL ) 200 MG tablet Take 200 mg by mouth every 6 (six) hours as needed.   Yes [provider]  oxyCODONE  (OXY IR/ROXICODONE ) 5 MG immediate release tablet Take 1 tablet (5 mg total) by mouth every  6 (six) hours as needed for up to 4 doses for moderate pain (pain score 4-6) or severe pain (pain score 7-10). 01/27/24  Yes Pray, Cain Castillo, MD  pantoprazole  (PROTONIX ) 40 MG tablet Take 1 tablet (40 mg total) by mouth daily. 11/18/23  Yes Leona Rake, MD    Physical Exam: BP (!) 142/84 (BP  Location: Left Arm)   Pulse 68   Temp 98.4 F (36.9 C)   Resp 16   Ht 5\' 1"  (1.549 m)   Wt 59 kg   LMP 01/28/2024   SpO2 100%   BMI 24.56 kg/m  General: Pleasant, sick-appearing young woman laying in bed with emesis bag. No acute distress. HEENT: [place]/AT. Anicteric sclera.  Dry mucous membrane. CV: RRR. No murmurs, rubs, or gallops. No LE edema Pulmonary: Lungs CTAB. Normal effort. No wheezing or rales. Abdominal: Soft, nondistended. Mild ttp of the epigastrium.  Normal bowel sounds. Extremities: Palpable radial and DP pulses. Normal ROM. Skin: Warm and dry. No obvious rash or lesions. Neuro: A&Ox3. Moves all extremities. Normal sensation to light touch. No focal deficit. Psych: Normal mood and affect          Labs on Admission:  Basic Metabolic Panel: Recent Labs  Lab 02/11/24 1104 02/11/24 1437 02/11/24 1627  NA 145  --  137  K 3.1*  --  3.3*  CL 92*  --   --   CO2 22  --   --   GLUCOSE 73  --   --   BUN 6  --   --   CREATININE 0.91  --   --   CALCIUM 10.0  --   --   MG  --  1.6*  --    Liver Function Tests: Recent Labs  Lab 02/11/24 1104  AST 360*  ALT 73*  ALKPHOS 134*  BILITOT 1.3*  PROT 8.4*  ALBUMIN 4.7   Recent Labs  Lab 02/11/24 1104  LIPASE 60*   No results for input(s): "AMMONIA" in the last 168 hours. CBC: Recent Labs  Lab 02/11/24 1104 02/11/24 1627  WBC 5.1  --   HGB 11.8* 11.9*  HCT 35.7* 35.0*  MCV 102.0*  --   PLT 127*  --    Cardiac Enzymes: No results for input(s): "CKTOTAL", "CKMB", "CKMBINDEX", "TROPONINI" in the last 168 hours. BNP (last 3 results) No results for input(s): "BNP" in the last 8760 hours.  ProBNP (last 3 results) No results for input(s): "PROBNP" in the last 8760 hours.  CBG: Recent Labs  Lab 02/11/24 1739 02/11/24 1831  GLUCAP 67* 128*    Radiological Exams on Admission: CT ABDOMEN PELVIS W CONTRAST Result Date: 02/11/2024 CLINICAL DATA:  Right upper quadrant abdominal pain. EXAM: CT ABDOMEN AND  PELVIS WITH CONTRAST TECHNIQUE: Multidetector CT imaging of the abdomen and pelvis was performed using the standard protocol following bolus administration of intravenous contrast. RADIATION DOSE REDUCTION: This exam was performed according to the departmental dose-optimization program which includes automated exposure control, adjustment of the mA and/or kV according to patient size and/or use of iterative reconstruction technique. CONTRAST:  60mL OMNIPAQUE  IOHEXOL  300 MG/ML  SOLN COMPARISON:  CT abdomen pelvis dated 11/17/2023. FINDINGS: Lower chest: The visualized lung bases are clear No intra-abdominal free air or free fluid. Hepatobiliary: Severe fatty liver. The liver is enlarged measuring 19 cm in midclavicular length. Findings likely represent steatohepatitis. Correlation with LFTs recommended. No biliary dilatation. No calcified gallstone. Pancreas: Focal area of hypodensity involving the tail of the pancreas with  surrounding stranding most consistent with acute pancreatitis. Correlation with pancreatic enzymes recommended. No dilatation of the main pancreatic duct. Spleen: Normal in size without focal abnormality. Adrenals/Urinary Tract: The adrenal glands are unremarkable. There is no hydronephrosis on either side. The visualized ureters and urinary bladder appear unremarkable. Stomach/Bowel: There is no bowel obstruction or active inflammation. The appendix is normal. Vascular/Lymphatic: The abdominal aorta and IVC are unremarkable. No portal venous gas. There is no adenopathy. Reproductive: The uterus is anteverted. No suspicious adnexal masses. There is a 2.5 cm left ovarian dominant follicle or corpus luteum. Other: None Musculoskeletal: Bilateral L5 pars defects. No listhesis. No acute osseous pathology. IMPRESSION: 1. Acute pancreatitis. Correlation with pancreatic enzymes recommended. 2. Enlarged and severely fatty liver suggestive of steatohepatitis. Correlation with LFTs recommended. 3. No bowel  obstruction. Normal appendix. Electronically Signed   By: Angus Bark M.D.   On: 02/11/2024 15:49   US  Abdomen Limited Result Date: 02/11/2024 CLINICAL DATA:  pain. hx of gallstone pancreatitis and cholelithiasis. RUQ and epigastric pain EXAM: ULTRASOUND ABDOMEN LIMITED RIGHT UPPER QUADRANT COMPARISON:  None Available. FINDINGS: Gallbladder: Mildly distended. No abnormal wall thickening. Very small volume dependent sludge noted. No pericholecystic free fluid. Sonographic Murphy sign was negative as per the technologist. Common bile duct: Diameter: Up to 4 mm.  No intrahepatic bile duct dilation. Liver: There is poor sound beam penetration to the deep / posterior aspects of the liver as a result of increased hepatic echogenicity which reduces the sensitivity of ultrasound for the detection of focal masses. That being said, no focal mass is identified. Portal vein is patent on color Doppler imaging with normal direction of blood flow towards the liver. Other: None. IMPRESSION: 1. Small volume dependent sludge noted in the gallbladder. No imaging evidence of acute cholecystitis. 2. Increased hepatic echogenicity, a nonspecific finding that is most commonly seen on the basis of steatosis in the absence of known liver disease. Electronically Signed   By: Beula Brunswick M.D.   On: 02/11/2024 13:52   DG Chest Port 1 View Result Date: 02/11/2024 CLINICAL DATA:  Chest pain. EXAM: PORTABLE CHEST 1 VIEW COMPARISON:  01/25/2024. FINDINGS: Bilateral lung fields are clear. Bilateral costophrenic angles are clear. Normal cardio-mediastinal silhouette. No acute osseous abnormalities. The soft tissues are within normal limits. IMPRESSION: No active disease. Electronically Signed   By: Beula Brunswick M.D.   On: 02/11/2024 12:31   Assessment/Plan Melissa Guzman is a 28 y.o. female with medical history significant for  anxiety, HTN, GERD, gallstone pancreatitis s/p attempted cholecystectomy aborted due to  adhesions in Jan 2025, hepatic steatosis who presented to [facility] for recurrent abdominal pain, nausea and vomiting and admitted for acute pancreatitis.  # Acute pancreatitis -Lipase mildly elevated to 60, CT A/P showing evidence of acute pancreatitis -Second episode of pancreatitis since January. -Likely gallstone pancreatitis -Admit for aggressive IV fluids and pain control -IV Dilaudid  as needed for pain -Continue IV LR 125 cc/h -Supportive care -N.p.o. for now, can advance diet as tolerated tomorrow -Scheduled for cholecystectomy on June 6, consider general surgery consult for cholecystectomy prior to discharge  # Elevated LFTs - Likely in the setting of acute pancreatitis and biliary sludge -Normal acute hepatitis panel -Will need cholecystectomy at some point hopefully during this hospitalization -Trend LFTs  # Hypokalemia -K+ 3.1->3.3 in the setting of nausea and vomiting -IV KCl 10 mEq x 3 doses  # Hypomagnesemia -Mag 1.6, status post magnesium  oxide 400 mg x 1 in the ED -Follow-up  repeat mag  # HTN -BP elevated with SBP in the 140s in the setting of acute pain -Continue amlodipine   # GERD -Continue Protonix   DVT prophylaxis: Lovenox      Code Status: Full Code  Consults called: None  Family Communication: No family at bedside  Severity of Illness: The appropriate patient status for this patient is INPATIENT. Inpatient status is judged to be reasonable and necessary in order to provide the required intensity of service to ensure the patient's safety. The patient's presenting symptoms, physical exam findings, and initial radiographic and laboratory data in the context of their chronic comorbidities is felt to place them at high risk for further clinical deterioration. Furthermore, it is not anticipated that the patient will be medically stable for discharge from the hospital within 2 midnights of admission.   * I certify that at the point of admission it is my  clinical judgment that the patient will require inpatient hospital care spanning beyond 2 midnights from the point of admission due to high intensity of service, high risk for further deterioration and high frequency of surveillance required.*  Level of care: Med-Surg   This record has been created using Conservation officer, historic buildings. Errors have been sought and corrected, but may not always be located. Such creation errors do not reflect on the standard of care.   Vita Grip, MD 02/11/2024, 10:35 PM [facility] Pager: [phone] Isaiah 41:10   If 7PM-7AM, please contact night-coverage [email or web address] Password [email or web address]

## 2024-02-11 NOTE — ED Notes (Signed)
 Called [facility] to transport patient to [facility] rm# [phone]

## 2024-02-11 NOTE — ED Triage Notes (Signed)
 Pt to ed C/O abdominal pain, intermittent x 5 months, current episode x 1 week, reports n/v. Hx cholecystitis. Scheduled for gallbladder removal June 6TH.

## 2024-02-11 NOTE — ED Notes (Addendum)
 Spoke with lab to add on acute hepatitis panel

## 2024-02-11 NOTE — ED Provider Notes (Signed)
 [facility] AT [facility] Provider Note   CSN: [id number] Arrival date & time: 02/11/24  1042     History {Add pertinent medical, surgical, social history, OB history to HPI:1} Chief Complaint  Patient presents with   Abdominal Pain    Melissa Guzman is a 28 y.o. female.   Abdominal Pain   28 year old female presents emergency department complaints of abdominal pain, nausea, vomiting.  States that she was recently discharged on 01/27/2024.  States that she felt okay for the next 3 to 4 days but after that, symptoms returned.  Reports pain in upper middle abdomen with radiation to the middle of her back, nausea and vomiting.  Yesterday, states that she had 20+ episodes of emesis nonbloody not coffee-ground in appearance.  States has a history of marijuana use with most recent use around a month ago.  Reports alcohol use with most recent use 2 weeks ago.  Denies any fevers, chills, urinary symptoms, change in bowel habits.  Past medical history significant for anemia, hypertension, UTI, pancreatitis, pancytopenia  Home Medications Prior to Admission medications   Medication Sig Start Date End Date Taking? Authorizing Provider  acetaminophen  (TYLENOL ) 500 MG tablet Take 2 tablets (1,000 mg total) by mouth every 8 (eight) hours as needed (pain). 03/05/22   Rosalita Combe, MD  amLODipine  (NORVASC ) 5 MG tablet Take 1 tablet (5 mg total) by mouth daily. 05/29/23 01/24/25  Vada Garibaldi, MD  oxyCODONE  (OXY IR/ROXICODONE ) 5 MG immediate release tablet Take 1 tablet (5 mg total) by mouth every 6 (six) hours as needed for up to 4 doses for moderate pain (pain score 4-6) or severe pain (pain score 7-10). 01/27/24   Charmel Cooter, MD  pantoprazole  (PROTONIX ) 40 MG tablet Take 1 tablet (40 mg total) by mouth daily. 11/18/23   Leona Rake, MD  promethazine  (PHENERGAN ) 25 MG tablet Take 25 mg by mouth every 6 (six) hours as needed. 11/30/23   [provider]      Allergies    Patient has no known allergies.    Review of Systems   Review of Systems  Gastrointestinal:  Positive for abdominal pain.    Physical Exam Updated Vital Signs BP (!) 143/102 (BP Location: Right Arm)   Pulse 98   Temp 98.2 F (36.8 C)   Resp 18   Ht 5\' 1"  (1.549 m)   Wt 59 kg   LMP 01/28/2024   SpO2 98%   BMI 24.56 kg/m  Physical Exam  ED Results / Procedures / Treatments   Labs (all labs ordered are listed, but only abnormal results are displayed) Labs Reviewed  LIPASE, BLOOD - Abnormal; Notable for the following components:      Result Value   Lipase 60 (*)    All other components within normal limits  COMPREHENSIVE METABOLIC PANEL WITH GFR - Abnormal; Notable for the following components:   Potassium 3.1 (*)    Chloride 92 (*)    Total Protein 8.4 (*)    AST 360 (*)    ALT 73 (*)    Alkaline Phosphatase 134 (*)    Total Bilirubin 1.3 (*)    Anion gap 30 (*)    All other components within normal limits  CBC - Abnormal; Notable for the following components:   RBC 3.50 (*)    Hemoglobin 11.8 (*)    HCT 35.7 (*)    MCV 102.0 (*)    Platelets 127 (*)  All other components within normal limits  URINALYSIS, ROUTINE W REFLEX MICROSCOPIC - Abnormal; Notable for the following components:   Hgb urine dipstick TRACE (*)    Ketones, ur >80 (*)    Protein, ur >300 (*)    Bacteria, UA RARE (*)    All other components within normal limits  PREGNANCY, URINE  MAGNESIUM     EKG None  Radiology No results found.  Procedures .Critical Care  Performed by: [place] Butter, PA Authorized by: [place] Butter, PA   Critical care provider statement:    Critical care time (minutes):  57   Critical care was necessary to treat or prevent imminent or life-threatening deterioration of the following conditions:  Dehydration   Critical care was time spent personally by me on the following activities:  Development of treatment plan with  patient or surrogate, discussions with consultants, evaluation of patient's response to treatment, examination of patient, ordering and review of laboratory studies, ordering and review of radiographic studies, ordering and performing treatments and interventions, pulse oximetry, re-evaluation of patient's condition and review of old charts   I assumed direction of critical care for this patient from another provider in my specialty: no     Care discussed with: admitting provider     {Document cardiac monitor, telemetry assessment procedure when appropriate:1}  Medications Ordered in ED Medications  lactated ringers  infusion (has no administration in time range)  morphine  (PF) 4 MG/ML injection 4 mg (has no administration in time range)  ondansetron  (ZOFRAN ) injection 4 mg (has no administration in time range)  potassium chloride  10 mEq in 100 mL IVPB (has no administration in time range)  lactated ringers  bolus 2,000 mL (has no administration in time range)    ED Course/ Medical Decision Making/ A&P Clinical Course as of 02/11/24 1849  Thu Feb 11, 2024  1433 Consulting attending physician Dr. Florie Husband regarding the patient who recommended adding VBG, lactic acid as well as obtaining CT imaging once ultrasound resulted and was reassuring. [CR]  1710 Consulted hospitalist Dr. Leann Prom regarding the patient who agreed with admission and assume further treatment/care. [CR]    Clinical Course User Index [CR] Marion Butter, PA   {   Click here for ABCD2, HEART and other calculatorsREFRESH Note before signing :1}                              Medical Decision Making Amount and/or Complexity of Data Reviewed Labs: ordered. Radiology: ordered.  Risk OTC drugs. Prescription drug management. Decision regarding hospitalization.   This patient presents to the ED for concern of abdominal pain nausea vomiting, this involves an extensive number of treatment options, and is a complaint that  carries with it a high risk of complications and morbidity.  The differential diagnosis includes gastritis, PUD, cholecystitis, CBD pathology, pancreatitis, hepatitis, SBO/LBO, volvulus, other   Co morbidities that complicate the patient evaluation  See HPI   Additional history obtained:  Additional history obtained from EMR External records from outside source obtained and reviewed including hospital records   Lab Tests:  I Ordered, and personally interpreted labs.  The pertinent results include: No leukocytosis.  Anemia with hemoglobin 11.8.  Thrombocytopenia platelets of 127.  Headache anemia of 3.1, hypochloremia of 92 otherwise electrolytes within normal limits.  Transaminitis AST of 360, ALT of 73 with elevation of alkaline phosphatase of 134 elevation total bilirubin of 1.3.  Normal renal function.  Anion gap  of 30.  UA with greater than 80 ketones, greater than 300 proteins, rare bacteria.  Initial lactic acid 1.3.  UDS positive for opiates most likely given in the emergency department.  Hypomagnesemia of 1.6.  Hepatitis panel pending.   Imaging Studies ordered:  I ordered imaging studies including chest x-Mattie, limited ultrasound, CT abdomen pelvis I independently visualized and interpreted imaging which showed  Chest x-Lapp: No acute cardiopulmonary abnormality. Ultrasound abdomen: Small volume dependent sludge in gallbladder.  Increased hepatic echogenicity. CT abdomen pelvis: Acute pancreatitis.  Enlarged and severely fatty liver suggestive of steato hepatitis.  No bowel obstruction normal appendix. I agree with the radiologist interpretation  Cardiac Monitoring: / EKG:  The patient was maintained on a cardiac monitor.  I personally viewed and interpreted the cardiac monitored which showed an underlying rhythm of: Sinus rhythm   Consultations Obtained:  See ED course  Problem List / ED Course / Critical interventions / Medication management  Acute pancreatitis,  steatohepatitis I ordered medication including lactated Ringer 's, potassium chloride , Zofran , fentanyl , magnesium  oxide, D5   Reevaluation of the patient after these medicines showed that the patient improved I have reviewed the patients home medicines and have made adjustments as needed   Social Determinants of Health:  Denies tobacco use.   Test / Admission - Considered:  Acute pancreatitis, steatohepatitis Vitals signs significant for hypertension. Otherwise within normal range and stable throughout visit. Laboratory/imaging studies significant for: See above 28 year old female presents emergency department complaints of abdominal pain, nausea, vomiting.  States that she was recently discharged on 01/27/2024.  States that she felt okay for the next 3 to 4 days but after that, symptoms returned.  Reports pain in upper middle abdomen with radiation to the middle of her back, nausea and vomiting.  Yesterday, states that she had 20+ episodes of emesis nonbloody not coffee-ground in appearance.  States has a history of marijuana use with most recent use around a month ago.  Reports alcohol use with most recent use 2 weeks ago.  Denies any fevers, chills, urinary symptoms, change in bowel habits. *** Worrisome signs and symptoms were discussed with the patient, and the patient acknowledged understanding to return to the ED if noticed. Patient was stable upon discharge.    {Document critical care time when appropriate:1} {Document review of labs and clinical decision tools ie heart score, Chads2Vasc2 etc:1}  {Document your independent review of radiology images, and any outside records:1} {Document your discussion with family members, caretakers, and with consultants:1} {Document social determinants of health affecting pt's care:1} {Document your decision making why or why not admission, treatments were needed:1} Final Clinical Impression(s) / ED Diagnoses Final diagnoses:  None    Rx / DC  Orders ED Discharge Orders     None

## 2024-02-12 DIAGNOSIS — K851 Biliary acute pancreatitis without necrosis or infection: Secondary | ICD-10-CM | POA: Diagnosis not present

## 2024-02-12 DIAGNOSIS — K7581 Nonalcoholic steatohepatitis (NASH): Secondary | ICD-10-CM

## 2024-02-12 DIAGNOSIS — R7401 Elevation of levels of liver transaminase levels: Secondary | ICD-10-CM

## 2024-02-12 LAB — CBC
HCT: 32.7 % — ABNORMAL LOW (ref 36.0–46.0)
Hemoglobin: 10.5 g/dL — ABNORMAL LOW (ref 12.0–15.0)
MCH: 33.5 pg (ref 26.0–34.0)
MCHC: 32.1 g/dL (ref 30.0–36.0)
MCV: 104.5 fL — ABNORMAL HIGH (ref 80.0–100.0)
Platelets: 89 10*3/uL — ABNORMAL LOW (ref 150–400)
RBC: 3.13 MIL/uL — ABNORMAL LOW (ref 3.87–5.11)
RDW: 14.5 % (ref 11.5–15.5)
WBC: 4.3 10*3/uL (ref 4.0–10.5)
nRBC: 0.5 % — ABNORMAL HIGH (ref 0.0–0.2)

## 2024-02-12 LAB — HEMOGLOBIN A1C
Hgb A1c MFr Bld: 3.8 % — ABNORMAL LOW (ref 4.8–5.6)
Mean Plasma Glucose: 62.36 mg/dL

## 2024-02-12 LAB — COMPREHENSIVE METABOLIC PANEL WITH GFR
ALT: 45 U/L — ABNORMAL HIGH (ref 0–44)
AST: 124 U/L — ABNORMAL HIGH (ref 15–41)
Albumin: 3.9 g/dL (ref 3.5–5.0)
Alkaline Phosphatase: 92 U/L (ref 38–126)
Anion gap: 17 — ABNORMAL HIGH (ref 5–15)
BUN: <5 mg/dL — ABNORMAL LOW (ref 6–20)
CO2: 20 mmol/L — ABNORMAL LOW (ref 22–32)
Calcium: 8.6 mg/dL — ABNORMAL LOW (ref 8.9–10.3)
Chloride: 98 mmol/L (ref 98–111)
Creatinine, Ser: 0.85 mg/dL (ref 0.44–1.00)
GFR, Estimated: >60 mL/min (ref >60)
Glucose, Bld: 130 mg/dL — ABNORMAL HIGH (ref 70–99)
Potassium: 3.3 mmol/L — ABNORMAL LOW (ref 3.5–5.1)
Sodium: 135 mmol/L (ref 135–145)
Total Bilirubin: 2.1 mg/dL — ABNORMAL HIGH (ref 0.0–1.2)
Total Protein: 7.5 g/dL (ref 6.5–8.1)

## 2024-02-12 LAB — GLUCOSE, CAPILLARY
Glucose-Capillary: 122 mg/dL — ABNORMAL HIGH (ref 70–99)
Glucose-Capillary: 123 mg/dL — ABNORMAL HIGH (ref 70–99)
Glucose-Capillary: 134 mg/dL — ABNORMAL HIGH (ref 70–99)
Glucose-Capillary: 168 mg/dL — ABNORMAL HIGH (ref 70–99)
Glucose-Capillary: 261 mg/dL — ABNORMAL HIGH (ref 70–99)
Glucose-Capillary: 67 mg/dL — ABNORMAL LOW (ref 70–99)
Glucose-Capillary: 70 mg/dL (ref 70–99)

## 2024-02-12 LAB — TROPONIN I (HIGH SENSITIVITY)
Troponin I (High Sensitivity): 217 ng/L (ref <18)
Troponin I (High Sensitivity): 243 ng/L (ref <18)

## 2024-02-12 LAB — PHOSPHORUS: Phosphorus: 3.1 mg/dL (ref 2.5–4.6)

## 2024-02-12 LAB — HIV ANTIBODY (ROUTINE TESTING W REFLEX): HIV Screen 4th Generation wRfx: NONREACTIVE

## 2024-02-12 LAB — MAGNESIUM: Magnesium: 1.7 mg/dL (ref 1.7–2.4)

## 2024-02-12 MED ORDER — NITROGLYCERIN 0.4 MG SL SUBL
0.4000 mg | SUBLINGUAL_TABLET | SUBLINGUAL | Status: DC | PRN
  Administered 2024-02-12: 0.4 mg via SUBLINGUAL
  Filled 2024-02-12: qty 1

## 2024-02-12 MED ORDER — POTASSIUM CHLORIDE 10 MEQ/100ML IV SOLN
10.0000 meq | INTRAVENOUS | Status: AC
  Administered 2024-02-12 (×3): 10 meq via INTRAVENOUS
  Filled 2024-02-12 (×3): qty 100

## 2024-02-12 MED ORDER — HYDROMORPHONE HCL 1 MG/ML IJ SOLN
0.5000 mg | Freq: Once | INTRAMUSCULAR | Status: AC
  Administered 2024-02-12: 0.5 mg via INTRAVENOUS
  Filled 2024-02-12: qty 0.5

## 2024-02-12 MED ORDER — DEXTROSE 50 % IV SOLN
25.0000 mL | Freq: Once | INTRAVENOUS | Status: AC
  Administered 2024-02-12: 25 mL via INTRAVENOUS
  Filled 2024-02-12: qty 50

## 2024-02-12 MED ORDER — ASPIRIN 325 MG PO TABS
325.0000 mg | ORAL_TABLET | Freq: Every day | ORAL | Status: DC
  Administered 2024-02-12: 325 mg via ORAL
  Filled 2024-02-12: qty 1

## 2024-02-12 MED ORDER — DEXTROSE IN LACTATED RINGERS 5 % IV SOLN: INTRAVENOUS | Status: AC

## 2024-02-12 NOTE — Progress Notes (Signed)
 Called [facility] for report. Left call back number at this time.

## 2024-02-12 NOTE — Progress Notes (Addendum)
 pt is complaining of chest pain tightness, denies palpations, lightlessness, dizziness. HR 102 doing EKG now 136/114  Notified Dr Betsey Brow, Susana Enter

## 2024-02-12 NOTE — Progress Notes (Signed)
 Pt transported to [facility] via WC and all belongings on self. She remains oriented and stable at baseline.

## 2024-02-12 NOTE — Plan of Care (Signed)
 Patient states, she was able to tolerate her clear liquid tray Problem: Education: Goal: Knowledge of General Education information will improve Description: Including pain rating scale, medication(s)/side effects and non-pharmacologic comfort measures Outcome: Progressing   Problem: Health Behavior/Discharge Planning: Goal: Ability to manage health-related needs will improve Outcome: Progressing   Problem: Clinical Measurements: Goal: Ability to maintain clinical measurements within normal limits will improve Outcome: Progressing Goal: Will remain free from infection Outcome: Progressing Goal: Diagnostic test results will improve Outcome: Progressing Goal: Respiratory complications will improve Outcome: Progressing Goal: Cardiovascular complication will be avoided Outcome: Progressing   Problem: Activity: Goal: Risk for activity intolerance will decrease Outcome: Progressing   Problem: Nutrition: Goal: Adequate nutrition will be maintained Outcome: Progressing   Problem: Coping: Goal: Level of anxiety will decrease Outcome: Progressing   Problem: Elimination: Goal: Will not experience complications related to bowel motility Outcome: Progressing Goal: Will not experience complications related to urinary retention Outcome: Progressing   Problem: Pain Managment: Goal: General experience of comfort will improve and/or be controlled Outcome: Progressing   Problem: Safety: Goal: Ability to remain free from injury will improve Outcome: Progressing   Problem: Skin Integrity: Goal: Risk for impaired skin integrity will decrease Outcome: Progressing   Problem: Education: Goal: Ability to describe self-care measures that may prevent or decrease complications (Diabetes Survival Skills Education) will improve Outcome: Progressing Goal: Individualized Educational Video(s) Outcome: Progressing   Problem: Coping: Goal: Ability to adjust to condition or change in health  will improve Outcome: Progressing   Problem: Fluid Volume: Goal: Ability to maintain a balanced intake and output will improve Outcome: Progressing   Problem: Health Behavior/Discharge Planning: Goal: Ability to identify and utilize available resources and services will improve Outcome: Progressing Goal: Ability to manage health-related needs will improve Outcome: Progressing   Problem: Metabolic: Goal: Ability to maintain appropriate glucose levels will improve Outcome: Progressing   Problem: Nutritional: Goal: Maintenance of adequate nutrition will improve Outcome: Progressing Goal: Progress toward achieving an optimal weight will improve Outcome: Progressing   Problem: Skin Integrity: Goal: Risk for impaired skin integrity will decrease Outcome: Progressing   Problem: Tissue Perfusion: Goal: Adequacy of tissue perfusion will improve Outcome: Progressing

## 2024-02-12 NOTE — Progress Notes (Signed)
 EKG result Given and Dr Earlean Glaze at Bedside

## 2024-02-12 NOTE — Progress Notes (Addendum)
 PROGRESS NOTE  Melissa Guzman  ZOX:[id number] DOB: 1996/07/24 DOA: 02/11/2024 PCP: [facility]  **Addendum: 02/12/2024  6:24 PM Called to room by nursing due to sudden onset CP.  EKG obtained, showed possible T wave abnormality but on review this was present on her last EKG.  Went to see Melissa Guzman.  She was lying in bed and appeared uncomfortable.  Described midsternal chest pain worse with palpation that started while she was lying in bed.  I pressed on her sternum and this reproduced her chest pain which she described as sharp stabbing pain.  Before seeing her I put in a stat troponin.  I think this might still be helpful but this definitely does not appear to be ACS.  She has not received any further Dilaudid  since 8 this morning and so I recommended nursing to go ahead and give her another dose.  Further addendum:  Troponin returned elevated.  Transferred to telemetry, given ASA/NTG x 1.  Discussed with on-call cardiology Dr. Alvis Ba.  Will cycle troponins, limit further ASA unless necessary due to pancreatitis.  2-D echo ordered.   ----------------------  Brief Narrative: 28 y.o. female with medical history significant for anxiety, HTN, GERD, gallstone pancreatitis s/p attempted cholecystectomy aborted due to adhesions in Jan 2025, hepatic steatosis who presented [facility] for recurrent abdominal pain, nausea and vomiting. Patient admitted diagnosis: 4/28 2/30 for similar presentation. She reports improvement in her abdominal symptoms for 3 to 4 days however the symptoms worsened after that.  Over the last 2 weeks, she has had more than 20 episodes of nonbloody nonbilious emesis.  Patient is scheduled for cholecystectomy on June 6.  Came to ED due to nausea and vomiting and pain.  Admitted for concern for recurrent pancreatitis  Assessment & Plan: # Acute pancreatitis -Lipase mildly elevated to 60, CT A/P showing evidence of acute pancreatitis -Second  episode of pancreatitis since January. -Likely gallstone pancreatitis -Admit for aggressive IV fluids and pain control -IV Dilaudid  as needed for pain -Continue IV LR 125 cc/h - Advance to clear liquid diet today. -Scheduled for cholecystectomy on June 6, consider general surgery consult for cholecystectomy prior to discharge  Anion gap metabolic acidosis: - High anion gap but mild acidosis. - Will trend - Likely secondary to ongoing nausea and vomiting.   # Elevated LFTs -Will need cholecystectomy at some point hopefully during this hospitalization -Trend LFTs   # Hypokalemia -K+ 3.1->3.3 in the setting of nausea and vomiting -IV KCl 10 mEq x 3 doses -Recheck tomorrow   # Hypomagnesemia -Mag 1.6, status post magnesium  oxide 400 mg x 1 in the ED -Follow-up repeat mag   # HTN -BP elevated with SBP in the 140s in the setting of acute pain -Continue amlodipine  -Diastolics remain elevated.  Systolics acceptable.  Will trend   # GERD -Continue Protonix   Macrocytic anemia: - Will check B12.  Unclear about her alcohol usage. - Also with thrombocytopenia which we will trend.  This plus macrocytosis plus elevated AST:ALT ratio makes ongoing alcohol use more likely culprit  Low A1c: - Patient is on CBG checks x 4. - Her A1c is very low at 3.8.  Likely some degree of anemia playing into how low her A1c is, but that said no need for ongoing CBG checks.     DVT prophylaxis:  enoxaparin  (LOVENOX ) injection 40 mg Start: 02/12/24 1000  Code Status:   Code Status: Full Code Level of care: Med-Surg Status is: Inpatient  Consults called: none   Subjective: Patient still with pain this morning.  No further vomiting since being brought to the floor.  She was able to sleep some last night.  Not feeling hungry on my examination this morning it.  Objective: Vitals:   02/12/24 0346 02/12/24 0814 02/12/24 0833 02/12/24 1216  BP: (!) 141/95 (!) 141/95 (!) 154/101 (!) 156/97  Pulse:  (!) 58  82   Resp: 16     Temp: 98.6 F (37 C)  98.2 F (36.8 C) 97.8 F (36.6 C)  TempSrc:   Oral   SpO2: 100%  98% 100%  Weight:      Height:        Intake/Output Summary (Last 24 hours) at 02/12/2024 1754 Last data filed at 02/12/2024 0348 Gross per 24 hour  Intake 1734.36 ml  Output --  Net 1734.36 ml   Filed Weights   02/11/24 1057  Weight: 59 kg   Body mass index is 24.56 kg/m.  Gen: 28 y.o. female in no apparent distress.  Nontoxic Pulm: Non-labored breathing.  Clear to auscultation bilaterally.  CV: Regular rate and rhythm. No murmur, rub, or gallop. No JVD GI: Abdomen soft, nondistended.  Tender to palpation mostly in right upper quadrant and epigastrium.  Good bowel sounds throughout.  No guarding or rebound. Ext: Warm, no deformities, no pedal edema Skin: No rashes, lesions no ulcers Neuro: Alert and oriented. No focal neurological deficits. Psych: Calm  Judgement and insight appear normal. Mood & affect appropriate.     I have personally reviewed the following labs and images: CBC: Recent Labs  Lab 02/11/24 1104 02/11/24 1627 02/12/24 0106  WBC 5.1  --  4.3  HGB 11.8* 11.9* 10.5*  HCT 35.7* 35.0* 32.7*  MCV 102.0*  --  104.5*  PLT 127*  --  89*   BMP &GFR Recent Labs  Lab 02/11/24 1104 02/11/24 1437 02/11/24 1627 02/12/24 0106  NA 145  --  137 135  K 3.1*  --  3.3* 3.3*  CL 92*  --   --  98  CO2 22  --   --  20*  GLUCOSE 73  --   --  130*  BUN 6  --   --  <5*  CREATININE 0.91  --   --  0.85  CALCIUM 10.0  --   --  8.6*  MG  --  1.6*  --  1.7  PHOS  --   --   --  3.1   Estimated Creatinine Clearance: 83.5 mL/min (by C-G formula based on SCr of 0.85 mg/dL). Liver & Pancreas: Recent Labs  Lab 02/11/24 1104 02/12/24 0106  AST 360* 124*  ALT 73* 45*  ALKPHOS 134* 92  BILITOT 1.3* 2.1*  PROT 8.4* 7.5  ALBUMIN 4.7 3.9   Recent Labs  Lab 02/11/24 1104  LIPASE 60*   No results for input(s): "AMMONIA" in the last 168  hours. Diabetic: Recent Labs    02/12/24 0106  HGBA1C 3.8*   Recent Labs  Lab 02/12/24 0229 02/12/24 0343 02/12/24 0723 02/12/24 1133 02/12/24 1609  GLUCAP 67* 134* 122* 123* 168*   Cardiac Enzymes: No results for input(s): "CKTOTAL", "CKMB", "CKMBINDEX", "TROPONINI" in the last 168 hours. No results for input(s): "PROBNP" in the last 8760 hours. Coagulation Profile: No results for input(s): "INR", "PROTIME" in the last 168 hours. Thyroid  Function Tests: No results for input(s): "TSH", "T4TOTAL", "FREET4", "T3FREE", "THYROIDAB" in the last 72 hours. Lipid Profile: No results for input(s): "  CHOL", "HDL", "LDLCALC", "TRIG", "CHOLHDL", "LDLDIRECT" in the last 72 hours. Anemia Panel: No results for input(s): "VITAMINB12", "FOLATE", "FERRITIN", "TIBC", "IRON ", "RETICCTPCT" in the last 72 hours. Urine analysis:    Component Value Date/Time   COLORURINE YELLOW 02/11/2024 1104   APPEARANCEUR CLEAR 02/11/2024 1104   LABSPEC 1.025 02/11/2024 1104   PHURINE 6.0 02/11/2024 1104   GLUCOSEU NEGATIVE 02/11/2024 1104   HGBUR TRACE (A) 02/11/2024 1104   BILIRUBINUR NEGATIVE 02/11/2024 1104   KETONESUR >80 (A) 02/11/2024 1104   PROTEINUR >300 (A) 02/11/2024 1104   UROBILINOGEN 0.2 08/19/2023 1540   NITRITE NEGATIVE 02/11/2024 1104   LEUKOCYTESUR NEGATIVE 02/11/2024 1104   Sepsis Labs: Invalid input(s): "PROCALCITONIN", "LACTICIDVEN"  Microbiology: Recent Results (from the past 240 hours)  Group A Strep by PCR     Status: None   Collection Time: 02/11/24  3:18 PM   Specimen: Urine, Clean Catch; Sterile Swab  Result Value Ref Range Status   Group A Strep by PCR NOT DETECTED NOT DETECTED Final    Comment: Performed at [facility], [address] [place], [place] [address]    Radiology Studies: No results found.  Scheduled Meds:  amLODipine   5 mg Oral Daily   enoxaparin  (LOVENOX ) injection  40 mg Subcutaneous Q24H   insulin aspart  0-9 Units Subcutaneous  Q4H   pantoprazole   40 mg Oral Daily   Continuous Infusions:  dextrose  5% lactated ringers  125 mL/hr at 02/12/24 1044     LOS: 1 day   35 minutes with more than 50% spent in reviewing records, counseling patient/family and coordinating care.  Trenton Frock, MD [facility] [email or web address] 02/12/2024, 5:54 PM

## 2024-02-12 NOTE — TOC Initial Note (Addendum)
 Transition of Care [facility]) - Initial/Assessment Note    Patient Details  Name: Melissa Guzman MRN: [id number] Date of Birth: Jul 04, 1996  [facility] CM/SW Contact:    Kathryn Parish, RN Phone Number: 02/12/2024, 12:31 PM  Clinical Narrative:                 NCM spoke with patient in the room. Patient said PTA lives in apartment with daughter; PCP/insurance verified; No DME, HH, or oxygen; Requested resources for assistance paying rent. Resources added to AVS; pt said her sister Maureen Sour [phone] will transport home at discharge. No additional TOC needs identified. TOC signing off.  If TOC needs present, please enter consult.  Expected Discharge Plan: Home/Self Care Barriers to Discharge: No Barriers Identified   Patient Goals and CMS Choice Patient states their goals for this hospitalization and ongoing recovery are:: Home   Choice offered to / list presented to : NA      Expected Discharge Plan and Services In-house Referral: NA Discharge Planning Services: CM Consult Post Acute Care Choice: NA Living arrangements for the past 2 months: Apartment                 DME Arranged: N/A DME Agency: NA       HH Arranged: NA HH Agency: NA        Prior Living Arrangements/Services Living arrangements for the past 2 months: Apartment Lives with:: Minor Children Patient language and need for interpreter reviewed:: Yes Do you feel safe going back to the place where you live?: Yes      Need for Family Participation in Patient Care: No (Comment) Care giver support system in place?: No (comment)   Criminal Activity/Legal Involvement Pertinent to Current Situation/Hospitalization: No - Comment as needed  Activities of Daily Living   ADL Screening (condition at time of admission) Independently performs ADLs?: Yes (appropriate for developmental age) Is the patient deaf or have difficulty hearing?: No Does the patient have difficulty seeing, even when  wearing glasses/contacts?: No Does the patient have difficulty concentrating, remembering, or making decisions?: No  Permission Sought/Granted Permission sought to share information with : Case Manager Permission granted to share information with : Yes, Verbal Permission Granted  Share Information with NAME: Case Manager           Emotional Assessment Appearance:: Appears stated age Attitude/Demeanor/Rapport: Gracious Affect (typically observed): Appropriate Orientation: : Oriented to Self, Oriented to Place, Oriented to  Time, Oriented to Situation Alcohol / Substance Use: Not Applicable Psych Involvement: No (comment)  Admission diagnosis:  Pancreatitis, acute [K85.90] Steatohepatitis [K75.81] Acute pancreatitis, unspecified complication status, unspecified pancreatitis type [K85.90] Patient Active Problem List   Diagnosis Date Noted   Steatohepatitis 02/12/2024   Transaminitis 02/12/2024   Pancreatitis, acute 02/11/2024   Chronic health problem 01/26/2024   RUQ abdominal pain 01/25/2024   Sepsis (HCC) 01/25/2024   Pancytopenia (HCC) 01/25/2024   Cholelithiasis 01/25/2024   Gastritis 11/17/2023   Biliary colic 11/17/2023   Acute pancreatitis 10/06/2023   Hypokalemia 10/06/2023   AKI (acute kidney injury) (HCC) 10/06/2023   Essential hypertension 04/16/2022   S/P cesarean section 03/01/2022   History of gestational hypertension 03/01/2022   Anxiety and depression 01/17/2022   Alpha thalassemia silent carrier 09/26/2021   Carrier of spinal muscular atrophy 09/26/2021   PCP:  [facility]:   [id number] - [place]ette Nestle, [place] - [address] AT [facility] [address] [phone] [place] [address] Phone: [phone] Fax: [phone]  [facility] [address] [place] [address] Phone: [phone] Fax: [phone]  [facility] [address] [place] [address] Phone: [phone] Fax: [phone]     Social Drivers of Health (SDOH) Social History: SDOH Screenings   Food Insecurity: No Food Insecurity (02/11/2024)  Housing: Low Risk  (02/11/2024)  Transportation Needs: No Transportation Needs (02/11/2024)  Utilities: Not At Risk (02/11/2024)  Depression (PHQ2-9): High Risk (06/22/2023)  Social Connections: Unknown (02/11/2022)   Received from [facility]  Tobacco Use: Low Risk  (02/11/2024)  Recent Concern: Tobacco Use - High Risk (11/20/2023)   Received from [facility]   SDOH Interventions:     Readmission Risk Interventions     No data to display

## 2024-02-12 NOTE — Plan of Care (Signed)

## 2024-02-12 NOTE — Progress Notes (Addendum)
 Report given to [facility] RN at this time. Pt made aware of transfer and is agreeable to plan of care. She denies any chest pain or discomfort. Will give ASA per [facility] prior to transfer.

## 2024-02-13 ENCOUNTER — Inpatient Hospital Stay (HOSPITAL_COMMUNITY)

## 2024-02-13 DIAGNOSIS — I5181 Takotsubo syndrome: Secondary | ICD-10-CM

## 2024-02-13 DIAGNOSIS — R079 Chest pain, unspecified: Secondary | ICD-10-CM | POA: Diagnosis not present

## 2024-02-13 DIAGNOSIS — K851 Biliary acute pancreatitis without necrosis or infection: Secondary | ICD-10-CM | POA: Diagnosis not present

## 2024-02-13 DIAGNOSIS — R7989 Other specified abnormal findings of blood chemistry: Secondary | ICD-10-CM | POA: Diagnosis not present

## 2024-02-13 LAB — CBC
HCT: 34.2 % — ABNORMAL LOW (ref 36.0–46.0)
HCT: 35.7 % — ABNORMAL LOW (ref 36.0–46.0)
Hemoglobin: 11.3 g/dL — ABNORMAL LOW (ref 12.0–15.0)
Hemoglobin: 11.6 g/dL — ABNORMAL LOW (ref 12.0–15.0)
MCH: 33.8 pg (ref 26.0–34.0)
MCH: 33.6 pg (ref 26.0–34.0)
MCHC: 33 g/dL (ref 30.0–36.0)
MCHC: 32.5 g/dL (ref 30.0–36.0)
MCV: 102.4 fL — ABNORMAL HIGH (ref 80.0–100.0)
MCV: 103.5 fL — ABNORMAL HIGH (ref 80.0–100.0)
Platelets: 86 10*3/uL — ABNORMAL LOW (ref 150–400)
Platelets: 91 10*3/uL — ABNORMAL LOW (ref 150–400)
RBC: 3.34 MIL/uL — ABNORMAL LOW (ref 3.87–5.11)
RBC: 3.45 MIL/uL — ABNORMAL LOW (ref 3.87–5.11)
RDW: 13.4 % (ref 11.5–15.5)
RDW: 13.4 % (ref 11.5–15.5)
WBC: 4.8 10*3/uL (ref 4.0–10.5)
WBC: 4.7 10*3/uL (ref 4.0–10.5)
nRBC: 0.4 % — ABNORMAL HIGH (ref 0.0–0.2)
nRBC: 0 % (ref 0.0–0.2)

## 2024-02-13 LAB — MAGNESIUM
Magnesium: 1.3 mg/dL — ABNORMAL LOW (ref 1.7–2.4)
Magnesium: 1.8 mg/dL (ref 1.7–2.4)

## 2024-02-13 LAB — COMPREHENSIVE METABOLIC PANEL WITH GFR
ALT: 34 U/L (ref 0–44)
AST: 68 U/L — ABNORMAL HIGH (ref 15–41)
Albumin: 3.5 g/dL (ref 3.5–5.0)
Alkaline Phosphatase: 82 U/L (ref 38–126)
Anion gap: 12 (ref 5–15)
BUN: <5 mg/dL — ABNORMAL LOW (ref 6–20)
CO2: 25 mmol/L (ref 22–32)
Calcium: 8.9 mg/dL (ref 8.9–10.3)
Chloride: 97 mmol/L — ABNORMAL LOW (ref 98–111)
Creatinine, Ser: 0.66 mg/dL (ref 0.44–1.00)
GFR, Estimated: >60 mL/min (ref >60)
Glucose, Bld: 113 mg/dL — ABNORMAL HIGH (ref 70–99)
Potassium: 3.3 mmol/L — ABNORMAL LOW (ref 3.5–5.1)
Sodium: 134 mmol/L — ABNORMAL LOW (ref 135–145)
Total Bilirubin: 1.5 mg/dL — ABNORMAL HIGH (ref 0.0–1.2)
Total Protein: 6.9 g/dL (ref 6.5–8.1)

## 2024-02-13 LAB — PHOSPHORUS
Phosphorus: 2.3 mg/dL — ABNORMAL LOW (ref 2.5–4.6)
Phosphorus: 2.3 mg/dL — ABNORMAL LOW (ref 2.5–4.6)

## 2024-02-13 LAB — BASIC METABOLIC PANEL WITH GFR
Anion gap: 14 (ref 5–15)
BUN: <5 mg/dL — ABNORMAL LOW (ref 6–20)
CO2: 23 mmol/L (ref 22–32)
Calcium: 8.8 mg/dL — ABNORMAL LOW (ref 8.9–10.3)
Chloride: 95 mmol/L — ABNORMAL LOW (ref 98–111)
Creatinine, Ser: 0.54 mg/dL (ref 0.44–1.00)
GFR, Estimated: >60 mL/min (ref >60)
Glucose, Bld: 117 mg/dL — ABNORMAL HIGH (ref 70–99)
Potassium: 3 mmol/L — ABNORMAL LOW (ref 3.5–5.1)
Sodium: 132 mmol/L — ABNORMAL LOW (ref 135–145)

## 2024-02-13 LAB — ECHOCARDIOGRAM COMPLETE
Area-P 1/2: 4.06 cm2
Calc EF: 24.7 %
Height: 61 in
S' Lateral: 3.3 cm
Single Plane A2C EF: 23.1 %
Single Plane A4C EF: 25.7 %
Weight: 2080 [oz_av]

## 2024-02-13 LAB — LIPASE, BLOOD: Lipase: 43 U/L (ref 11–51)

## 2024-02-13 LAB — TROPONIN I (HIGH SENSITIVITY)
Troponin I (High Sensitivity): 324 ng/L (ref <18)
Troponin I (High Sensitivity): 360 ng/L (ref <18)
Troponin I (High Sensitivity): 336 ng/L (ref <18)
Troponin I (High Sensitivity): 265 ng/L (ref <18)
Troponin I (High Sensitivity): 222 ng/L (ref <18)

## 2024-02-13 LAB — HEPARIN LEVEL (UNFRACTIONATED): Heparin Unfractionated: 0.63 [IU]/mL (ref 0.30–0.70)

## 2024-02-13 MED ORDER — MAGNESIUM SULFATE 2 GM/50ML IV SOLN
2.0000 g | Freq: Once | INTRAVENOUS | Status: AC
  Administered 2024-02-13: 2 g via INTRAVENOUS
  Filled 2024-02-13: qty 50

## 2024-02-13 MED ORDER — HEPARIN BOLUS VIA INFUSION
2000.0000 [IU] | Freq: Once | INTRAVENOUS | Status: AC
  Administered 2024-02-13: 2000 [IU] via INTRAVENOUS
  Filled 2024-02-13: qty 2000

## 2024-02-13 MED ORDER — MORPHINE SULFATE (PF) 2 MG/ML IV SOLN
2.0000 mg | INTRAVENOUS | Status: DC | PRN
  Administered 2024-02-13 – 2024-02-14 (×4): 2 mg via INTRAVENOUS
  Filled 2024-02-13 (×4): qty 1

## 2024-02-13 MED ORDER — PROCHLORPERAZINE EDISYLATE 10 MG/2ML IJ SOLN
5.0000 mg | Freq: Four times a day (QID) | INTRAMUSCULAR | Status: DC | PRN
Start: 2024-02-13 — End: 2024-02-16
  Administered 2024-02-14 (×2): 5 mg via INTRAVENOUS
  Filled 2024-02-13 (×2): qty 2

## 2024-02-13 MED ORDER — ALUM & MAG HYDROXIDE-SIMETH 200-200-20 MG/5ML PO SUSP
30.0000 mL | ORAL | Status: AC | PRN
  Administered 2024-02-13: 30 mL via ORAL
  Filled 2024-02-13: qty 30

## 2024-02-13 MED ORDER — POTASSIUM CHLORIDE CRYS ER 20 MEQ PO TBCR
40.0000 meq | EXTENDED_RELEASE_TABLET | Freq: Once | ORAL | Status: AC
Start: 2024-02-13 — End: 2024-02-13
  Administered 2024-02-13: 40 meq via ORAL
  Filled 2024-02-13: qty 2

## 2024-02-13 MED ORDER — POTASSIUM CHLORIDE CRYS ER 20 MEQ PO TBCR
40.0000 meq | EXTENDED_RELEASE_TABLET | Freq: Once | ORAL | Status: AC
  Administered 2024-02-13: 40 meq via ORAL
  Filled 2024-02-13: qty 2

## 2024-02-13 MED ORDER — HEPARIN (PORCINE) 25000 UT/250ML-% IV SOLN
1000.0000 [IU]/h | INTRAVENOUS | Status: DC
  Administered 2024-02-13 – 2024-02-14 (×2): 1000 [IU]/h via INTRAVENOUS
  Filled 2024-02-13 (×3): qty 250

## 2024-02-13 MED ORDER — METOPROLOL TARTRATE 12.5 MG HALF TABLET
12.5000 mg | ORAL_TABLET | Freq: Two times a day (BID) | ORAL | Status: DC
  Administered 2024-02-13 – 2024-02-18 (×11): 12.5 mg via ORAL
  Filled 2024-02-13 (×11): qty 1

## 2024-02-13 MED ORDER — PERFLUTREN LIPID MICROSPHERE
1.0000 mL | INTRAVENOUS | Status: AC | PRN
  Administered 2024-02-13: 3 mL via INTRAVENOUS

## 2024-02-13 MED ORDER — ENSURE ENLIVE PO LIQD
237.0000 mL | Freq: Two times a day (BID) | ORAL | Status: DC
  Administered 2024-02-14 – 2024-02-18 (×8): 237 mL via ORAL

## 2024-02-13 NOTE — Progress Notes (Addendum)
 PROGRESS NOTE  Melissa Guzman  WUJ:[id number] DOB: May 12, 1996 DOA: 02/11/2024 PCP: [facility]  Brief Narrative: 28 y.o. female with medical history significant for anxiety, HTN, GERD, gallstone pancreatitis s/p attempted cholecystectomy aborted due to adhesions in Jan 2025, hepatic steatosis who presented to [facility] for recurrent abdominal pain, nausea and vomiting. Patient admitted diagnosis: 4/28 2/30 for similar presentation. She reports improvement in her abdominal symptoms for 3 to 4 days however the symptoms worsened after that.  Over the last 2 weeks, she has had more than 20 episodes of nonbloody nonbilious emesis.  Patient is scheduled for cholecystectomy on June 6.  Came to ED due to nausea and vomiting and pain.  Admitted for concern for recurrent pancreatitis.  Had episode of chest pain evening of 5/16.  Troponins elevated and cardiology consulted.  Echo obtained.  Assessment & Plan: Cardiomyopathy:  - With episode of chest pain yesterday.  EKG relatively unchanged but with elevated troponins, which are now downtrending. - Still has very mild chest discomfort today. 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 -Echo also read as 20 to 25% ejection fraction. - Greatly appreciate cardiology input. - Started her on heparin  today for the LV thrombus.  Defer to cardiology for GDMT for cardiomyopathy.  She will likely eventually need to be put on warfarin prior to discharge. - Fortunately she is on room air and in no distress at rest.  No lower extremity edema.  Likely LV thrombus: - heparin  as above.   Prolonged QTc: - She has been receiving Zofran  for her nausea and vomiting. -  had 1 episode of vomiting earlier this morning.  By the time my exam later in the morning her nausea had abated.  She did  receive episode of Zofran  this morning.   -I have switched her to Compazine , although most antiemetics have risk of prolonged QT. - She is on progressive unit with ongoing telemetry monitoring. - Goal mag > 2 and potassium >4.  Replaced today.  # Acute pancreatitis - Initial reason for hospitalization. - Still with some abdominal pain but it is vastly improved since admission. - Some nausea as above though this is abating. - She is asking to be moved to full liquid diet and will do that today. - Continue IV pain control. - Will continue with oral hydration in light of depressed EF. - Plan on admission was for general surgery consult which she improved this admission for consideration of cholecystectomy.  That has now changed in light of problem 1 above.  Will consult general surgery on 5/19 for further discussion about timing of surgery  Anion gap metabolic acidosis: Secondary to ongoing nausea and vomiting, now resolved   # Elevated LFTs Downtrending  # Hypokalemia - Secondary to nausea vomiting. - Rechecking.  See goals above.   # Hypomagnesemia -Mag 1.6, status post magnesium  oxide 400 mg x 1 in the ED -Follow-up repeat mag   # HTN -BP elevated with SBP in the 140s in the setting of acute pain -Continue amlodipine  -Diastolics remain elevated.  Systolics acceptable.  Will trend   # GERD -Continue Protonix   Macrocytic anemia: - Will check B12.  Unclear about her alcohol usage.  Thrombocytopenia: - Noted and will need to trend in light of heparin  usage.  Low A1c: - Patient is on CBG checks x 4. - Her A1c is very  low at 3.8.  Likely some degree of anemia playing into how low her A1c is, but that said no need for ongoing CBG checks.     DVT prophylaxis:  Heparin  Family communication: Long discussion with patient's mother on the phone about cardiomyopathy and plan moving forward Code Status:   Code Status: Full Code Level of care: Progressive Status is:  Inpatient  Consults called: Cardiology  Subjective: Still with mild residual chest pain from yesterday but greatly improved.  Abdominal pain is also improving although still somewhat present.  She was able to tolerate clear liquid diet and is asking for full liquid diet at lunch.  Vomiting x 1 this morning.  Objective: Vitals:   02/13/24 0446 02/13/24 0841 02/13/24 1142 02/13/24 1547  BP: (!) 126/97 (!) 123/92 (!) 128/96 (!) 123/103  Pulse: 85 76 77 86  Resp: 20  12   Temp: 98.4 F (36.9 C) 97.9 F (36.6 C) 98.1 F (36.7 C) 98.5 F (36.9 C)  TempSrc: Oral Oral Oral Oral  SpO2: 99% 100% 100% 100%  Weight:      Height:        Intake/Output Summary (Last 24 hours) at 02/13/2024 1622 Last data filed at 02/13/2024 1506 Gross per 24 hour  Intake 3338.51 ml  Output --  Net 3338.51 ml   Filed Weights   02/11/24 1057  Weight: 59 kg   Body mass index is 24.56 kg/m.  Gen: 28 y.o. female in no apparent distress.  Nontoxic Pulm: Non-labored breathing.  Clear to auscultation bilaterally.  CV: Regular rate and rhythm.  GI: Abdomen soft, nondistended.  Tender to palpation in epigastrium.  Good bowel sounds throughout.  No guarding or rebound. Ext: Warm, no deformities, no pedal edema Skin: No rashes, lesions no ulcers Neuro: Alert and oriented. No focal neurological deficits. Psych: Calm  Judgement and insight appear normal. Mood & affect appropriate.     I have personally reviewed the following labs and images: CBC: Recent Labs  Lab 02/11/24 1104 02/11/24 1627 02/12/24 0106 02/13/24 0122 02/13/24 0942  WBC 5.1  --  4.3 4.8 4.7  HGB 11.8* 11.9* 10.5* 11.3* 11.6*  HCT 35.7* 35.0* 32.7* 34.2* 35.7*  MCV 102.0*  --  104.5* 102.4* 103.5*  PLT 127*  --  89* 86* 91*   BMP &GFR Recent Labs  Lab 02/11/24 1104 02/11/24 1437 02/11/24 1627 02/12/24 0106 02/13/24 0122 02/13/24 0942  NA 145  --  137 135 134* 132*  K 3.1*  --  3.3* 3.3* 3.3* 3.0*  CL 92*  --   --  98 97* 95*   CO2 22  --   --  20* 25 23  GLUCOSE 73  --   --  130* 113* 117*  BUN 6  --   --  <5* <5* <5*  CREATININE 0.91  --   --  0.85 0.66 0.54  CALCIUM 10.0  --   --  8.6* 8.9 8.8*  MG  --  1.6*  --  1.7 1.3* 1.8  PHOS  --   --   --  3.1 2.3* 2.3*   Estimated Creatinine Clearance: 88.8 mL/min (by C-G formula based on SCr of 0.54 mg/dL). Liver & Pancreas: Recent Labs  Lab 02/11/24 1104 02/12/24 0106 02/13/24 0122  AST 360* 124* 68*  ALT 73* 45* 34  ALKPHOS 134* 92 82  BILITOT 1.3* 2.1* 1.5*  PROT 8.4* 7.5 6.9  ALBUMIN 4.7 3.9 3.5   Recent Labs  Lab 02/11/24 1104 02/13/24  0122  LIPASE 60* 43   No results for input(s): "AMMONIA" in the last 168 hours. Diabetic: Recent Labs    02/12/24 0106  HGBA1C 3.8*   Recent Labs  Lab 02/12/24 0229 02/12/24 0343 02/12/24 0723 02/12/24 1133 02/12/24 1609  GLUCAP 67* 134* 122* 123* 168*   Cardiac Enzymes: No results for input(s): "CKTOTAL", "CKMB", "CKMBINDEX", "TROPONINI" in the last 168 hours. No results for input(s): "PROBNP" in the last 8760 hours. Coagulation Profile: No results for input(s): "INR", "PROTIME" in the last 168 hours. Thyroid  Function Tests: No results for input(s): "TSH", "T4TOTAL", "FREET4", "T3FREE", "THYROIDAB" in the last 72 hours. Lipid Profile: No results for input(s): "CHOL", "HDL", "LDLCALC", "TRIG", "CHOLHDL", "LDLDIRECT" in the last 72 hours. Anemia Panel: No results for input(s): "VITAMINB12", "FOLATE", "FERRITIN", "TIBC", "IRON ", "RETICCTPCT" in the last 72 hours. Urine analysis:    Component Value Date/Time   COLORURINE YELLOW 02/11/2024 1104   APPEARANCEUR CLEAR 02/11/2024 1104   LABSPEC 1.025 02/11/2024 1104   PHURINE 6.0 02/11/2024 1104   GLUCOSEU NEGATIVE 02/11/2024 1104   HGBUR TRACE (A) 02/11/2024 1104   BILIRUBINUR NEGATIVE 02/11/2024 1104   KETONESUR >80 (A) 02/11/2024 1104   PROTEINUR >300 (A) 02/11/2024 1104   UROBILINOGEN 0.2 08/19/2023 1540   NITRITE NEGATIVE 02/11/2024 1104    LEUKOCYTESUR NEGATIVE 02/11/2024 1104   Sepsis Labs: Invalid input(s): "PROCALCITONIN", "LACTICIDVEN"  Microbiology: Recent Results (from the past 240 hours)  Group A Strep by PCR     Status: None   Collection Time: 02/11/24  3:18 PM   Specimen: Urine, Clean Catch; Sterile Swab  Result Value Ref Range Status   Group A Strep by PCR NOT DETECTED NOT DETECTED Final    Comment: Performed at [facility], [address] [place], [place] [address]    Radiology Studies: ECHOCARDIOGRAM COMPLETE Result Date: 02/13/2024    ECHOCARDIOGRAM REPORT   Patient Name:   RUT BETTERTON Minniefield Date of Exam: 02/13/2024 Medical Rec #:  [id number]                Height:       61.0 in Accession #:    [id number]               Weight:       130.0 lb Date of Birth:  [birth year]                BSA:          1.573 m Patient Age:    27 years                 BP:           123/92 mmHg Patient Gender: F                        HR:           79 bpm. Exam Location:  Inpatient Procedure: 2D Echo, Color Doppler, Cardiac Doppler and Intracardiac            Opacification Agent (Both Spectral and Color Flow Doppler were            utilized during procedure). Indications:     Elevated Troponins  History:         Patient has no prior history of Echocardiogram examinations.                  Risk Factors:Hypertension.  Sonographer:     Sherline Distel Senior RDCS Referring  Phys:  [id number] Latwan Luchsinger H Delainey Winstanley Diagnosing Phys: Vishnu Priya Mallipeddi IMPRESSIONS  1. There is an elliptical shaped echodensity, measuring 1.1 x 0.7 cm, located in the apical septal wall highly suspicious for LV thrombus. Swirling of contrast material at the apex is noted. Left ventricular ejection fraction, by estimation, is 20 to 25%. Left ventricular ejection fraction by 2D MOD biplane is 24.7 %. The left ventricle has severely decreased function. 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 Left ventricular diastolic parameters are consistent with Grade II diastolic dysfunction (pseudonormalization).  2. Right ventricular systolic function is moderately reduced. The right ventricular size is normal. There is normal pulmonary artery systolic pressure. The estimated right ventricular systolic pressure is 27.2 mmHg.  3. The mitral valve is normal in structure. Trivial mitral valve regurgitation. No evidence of mitral stenosis.  4. The aortic valve is tricuspid. Aortic valve regurgitation is not visualized. No aortic stenosis is present.  5. The inferior vena cava is normal in size with <50% respiratory variability, suggesting right atrial pressure of 8 mmHg. Comparison(s): No prior Echocardiogram. FINDINGS  Left Ventricle: There is an elliptical shaped echodensity, measuring 1.1 x 0.7 cm, located in the apical septal wall highly suspicious for LV thrombus. Swirling of contrast material at the apex is noted. Left ventricular ejection fraction, by estimation, is 20 to 25%. Left ventricular ejection fraction by 2D MOD biplane is 24.7 %. The left ventricle has severely decreased function. The left ventricle demonstrates regional wall motion abnormalities. Definity contrast agent was given IV to delineate the left ventricular endocardial borders. Strain was performed and the global longitudinal strain is indeterminate. The left ventricular internal cavity size was normal in size. There is no left ventricular hypertrophy. Left ventricular diastolic parameters are consistent with Grade II diastolic dysfunction (pseudonormalization).  LV Wall Scoring: The mid and distal anterior wall, mid and distal lateral wall, mid and distal anterior septum, entire apex, mid and distal inferior wall, mid anterolateral segment, and mid inferoseptal segment are akinetic. The basal anteroseptal segment, basal inferolateral segment, basal anterolateral segment, basal  anterior segment, basal inferior segment, and basal inferoseptal segment are normal. Right Ventricle: The right ventricular size is normal. No increase in right ventricular wall thickness. Right ventricular systolic function is moderately reduced. There is normal pulmonary artery systolic pressure. The tricuspid regurgitant velocity is 2.19 m/s, and with an assumed right atrial pressure of 8 mmHg, the estimated right ventricular systolic pressure is 27.2 mmHg. Left Atrium: Left atrial size was normal in size. Right Atrium: Right atrial size was normal in size. Pericardium: Trivial pericardial effusion is present. Mitral Valve: The mitral valve is normal in structure. Trivial mitral valve regurgitation. No evidence of mitral valve stenosis. Tricuspid Valve: The tricuspid valve is normal in structure. Tricuspid valve regurgitation is mild . No evidence of tricuspid stenosis. Aortic Valve: The aortic valve is tricuspid. Aortic valve regurgitation is not visualized. No aortic stenosis is present. Pulmonic Valve: The pulmonic valve was normal in structure. Pulmonic valve regurgitation is trivial. No evidence of pulmonic stenosis. Aorta: The aortic root and ascending aorta are structurally normal, with no evidence of dilitation. Venous: The inferior vena cava is normal in size with less than 50% respiratory variability, suggesting right atrial pressure of 8 mmHg. IAS/Shunts: No atrial level shunt detected by color flow Doppler. Additional Comments: 3D was performed not requiring image post processing on an independent workstation and was indeterminate.  LEFT VENTRICLE PLAX 2D                        Biplane EF (MOD) LVIDd:         4.60 cm         LV Biplane EF:   Left LVIDs:         3.30 cm                          ventricular LV PW:         0.80 cm                          ejection LV IVS:        0.90 cm                          fraction by LVOT diam:     2.10 cm                          2D MOD LV SV:         37                                biplane is LV SV Index:   23                               24.7 %. LVOT Area:     3.46 cm                                Diastology                                LV e' medial:    4.57 cm/s LV Volumes (MOD)               LV E/e' medial:  14.0 LV vol d, MOD    97.5 ml       LV e' lateral:   5.55 cm/s A2C:                           LV E/e' lateral: 11.5 LV vol d, MOD    129.0 ml A4C: LV vol s, MOD    75.0 ml A2C: LV vol s, MOD    95.9 ml A4C: LV SV MOD A2C:   22.5 ml LV SV MOD A4C:   129.0 ml LV SV MOD BP:    27.8 ml RIGHT VENTRICLE RV S prime:     9.25 cm/s TAPSE (M-mode): 1.3 cm LEFT ATRIUM             Index        RIGHT ATRIUM          Index LA diam:        2.90 cm 1.84 cm/m   RA Area:     8.65 cm LA Vol (A2C):   36.2 ml 23.02 ml/m  RA Volume:   15.70 ml 9.98 ml/m LA Vol (A4C):   26.2 ml 16.66 ml/m LA Biplane Vol: 30.9 ml 19.65 ml/m  AORTIC VALVE LVOT Vmax:   57.40 cm/s LVOT Vmean:  43.100 cm/s LVOT VTI:    0.106 m  AORTA Ao Root diam: 2.90 cm Ao Asc diam:  2.80 cm MITRAL VALVE               TRICUSPID VALVE MV Area (PHT): 4.06 cm    TR Peak grad:   19.2 mmHg MV Decel Time: 187 msec    TR Vmax:        219.00 cm/s MV E velocity: 63.80 cm/s MV A velocity: 40.70 cm/s  SHUNTS MV E/A ratio:  1.57        Systemic VTI:  0.11 m                            Systemic Diam: 2.10 cm Vishnu Priya Mallipeddi Electronically signed by Lucetta Russel Mallipeddi Signature Date/Time: 02/13/2024/12:54:57 PM    Final (Updated)     Scheduled Meds:  amLODipine   5 mg Oral Daily   metoprolol tartrate  12.5 mg Oral BID   pantoprazole   40 mg Oral Daily   Continuous Infusions:  heparin  1,000 Units/hr (02/13/24 1443)     LOS: 2 days   35 minutes with more than 50% spent in reviewing records, counseling patient/family and coordinating care.  Trenton Frock, MD [facility] [email or web address] 02/13/2024, 4:22 PM

## 2024-02-13 NOTE — Progress Notes (Signed)
 PHARMACY - ANTICOAGULATION CONSULT NOTE  Pharmacy Consult for IV Heparin  Indication: LV thrombus   No Known Allergies  Patient Measurements: Height: 5\' 1"  (154.9 cm) Weight: 59 kg (130 lb) IBW/kg (Calculated) : 47.8 HEPARIN  DW (KG): 59  Vital Signs: Temp: 98.1 F (36.7 C) (05/17 1142) Temp Source: Oral (05/17 1142) BP: 128/96 (05/17 1142) Pulse Rate: 77 (05/17 1142)  Labs: Recent Labs    02/12/24 0106 02/12/24 1830 02/13/24 0122 02/13/24 0247 02/13/24 0615 02/13/24 0754 02/13/24 0942  HGB 10.5*  --  11.3*  --   --   --  11.6*  HCT 32.7*  --  34.2*  --   --   --  35.7*  PLT 89*  --  86*  --   --   --  91*  CREATININE 0.85  --  0.66  --   --   --  0.54  TROPONINIHS  --    < > 360* 336* 265* 222*  --    < > = values in this interval not displayed.    Estimated Creatinine Clearance: 88.8 mL/min (by C-G formula based on SCr of 0.54 mg/dL).   Medical History: Past Medical History:  Diagnosis Date   Acute blood loss anemia 03/01/2022   Anemia    Anxiety    COVID-19 affecting pregnancy in third trimester 02/04/2022   +01/28/2022   Endometritis 03/05/2022   Hypertension    Postpartum septic endometritis 03/09/2022   UTI (urinary tract infection) 05/26/2023    Medications:  Medications Prior to Admission  Medication Sig Dispense Refill Last Dose/Taking   acetaminophen  (TYLENOL ) 500 MG tablet Take 2 tablets (1,000 mg total) by mouth every 8 (eight) hours as needed (pain). 60 tablet 0 Past Week   amLODipine  (NORVASC ) 5 MG tablet Take 1 tablet (5 mg total) by mouth daily. 30 tablet 0 02/10/2024   ibuprofen  (ADVIL ) 200 MG tablet Take 200 mg by mouth every 6 (six) hours as needed.   Past Week   oxyCODONE  (OXY IR/ROXICODONE ) 5 MG immediate release tablet Take 1 tablet (5 mg total) by mouth every 6 (six) hours as needed for up to 4 doses for moderate pain (pain score 4-6) or severe pain (pain score 7-10). 4 tablet 0 Taking As Needed   pantoprazole  (PROTONIX ) 40 MG tablet  Take 1 tablet (40 mg total) by mouth daily. 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 No PTA anticoagulation. Pt did receive dose of enoxaparin  40 mg on 5/17 at 0906.   Today, 02/13/24 Hgb 11.6, plt 91 Plt low but have been stable.  SCr 0.54 mg/dl, Crcl 89 ml/min    Goal of Therapy:  Heparin  level 0.3-0.7 units/ml Monitor platelets by anticoagulation protocol: Yes   Plan:  Heparin  2000 unit bolus ( lower than usual do to enoxaparin  dose this AM) then heparin  1000 units/hr Obtain HL 6 hours after start of infusion Daily CBC  Monitor for signs and symptoms of bleeding    Van Gelinas, PharmD, BCPS 02/13/2024 2:17 PM

## 2024-02-13 NOTE — Consult Note (Addendum)
 CARDIOLOGY CONSULT NOTE    Patient ID: Melissa Guzman MRN: [id number], DOB/AGE: 1996/06/18 27 y.o.  Admit date: 02/11/2024 Date of Consult: 02/13/2024  Primary Physician: [facility] Primary Cardiologist: Not established Electrophysiologist: Not established  Patient Profile: Melissa Guzman is a 28 y.o. female with a history of hepatic steatosis, gallstone pancreatitis, hypertension who is being seen today for the evaluation of chest pain at the request of Dr. Earlean Glaze.  HPI:  Melissa Guzman is a 28 y.o. female with a history of hepatic steatosis and gallstone pancreatitis.  She has a history of gallstone pancreatitis status post attempted cholecystectomy aborted due to adhesions in January 25, hepatic steatosis, hypertension, GERD who presented to the [facility] ER with recurrent abdominal pain nausea and vomiting.  Lipase was mildly elevated, and CT showed evidence of acute pancreatitis.  She was also noted to have hypokalemia and hypomagnesia.  Yesterday, the patient began experiencing sharp stabbing chest pain.  The examining physician noticed that the pain was reproducible with chest palpation.  Troponin was ordered and returned positive at 217, peaking to 360, and climbed back down to 222.  EKG showed nonspecific lateral T wave changes the patient was given a dose of aspirin  and nitroglycerin  nitroglycerin . echocardiogram was ordered but has not yet been resulted.  EKG this a.m. shows markedly abnormal T wave inversions with QT prolongation.  She denies chest pain, palpitations, dyspnea, PND, orthopnea, nausea, vomiting, dizziness, syncope, edema, or weight gain.  Past Medical History:  Diagnosis Date   Acute blood loss anemia 03/01/2022   Anemia    Anxiety    COVID-19 affecting pregnancy in third trimester 02/04/2022   +01/28/2022   Endometritis 03/05/2022   Hypertension    Postpartum septic endometritis 03/09/2022   UTI (urinary  tract infection) 05/26/2023      Home medications Medications Prior to Admission  Medication Sig Dispense Refill Last Dose/Taking   acetaminophen  (TYLENOL ) 500 MG tablet Take 2 tablets (1,000 mg total) by mouth every 8 (eight) hours as needed (pain). 60 tablet 0 Past Week   amLODipine  (NORVASC ) 5 MG tablet Take 1 tablet (5 mg total) by mouth daily. 30 tablet 0 02/10/2024   ibuprofen  (ADVIL ) 200 MG tablet Take 200 mg by mouth every 6 (six) hours as needed.   Past Week   oxyCODONE  (OXY IR/ROXICODONE ) 5 MG immediate release tablet Take 1 tablet (5 mg total) by mouth every 6 (six) hours as needed for up to 4 doses for moderate pain (pain score 4-6) or severe pain (pain score 7-10). 4 tablet 0 Taking As Needed   pantoprazole  (PROTONIX ) 40 MG tablet Take 1 tablet (40 mg total) by mouth daily. 30 tablet 0 02/11/2024      Physical Exam: Vitals:   02/12/24 2029 02/13/24 0106 02/13/24 0446 02/13/24 0841  BP: (!) 141/115 (!) 124/94 (!) 126/97 (!) 123/92  Pulse: 83 75 85 76  Resp:  20 20   Temp: 98.5 F (36.9 C) 97.7 F (36.5 C) 98.4 F (36.9 C) 97.9 F (36.6 C)  TempSrc: Oral Oral Oral Oral  SpO2: 99% 97% 99% 100%  Weight:      Height:        Gen: Appears comfortable, well-nourished CV: RRR, no dependent edema Pulm: breathing easily MSK: no chest pain reproducible with sternal and parasternal palpation   PERTINENT STUDIES SUMMARIZED:  Echocardiogram:      Pending   EKG:   Recent ECG shows T wave inversion and prolonged  QTc (personally reviewed)  TELEMETRY:    Sinuns rhythm and sinus tachycardia (personally reviewed)   ASSESSMENT & PLAN:  Chest pain, positive hsTnI, T wave inversion on ECG Reproducible with palpation per MD at time of initial examination at time of onset She continues to have very mild, smoldering chest discomfort  Discomfort improved after receiving SL nitroglycerine I suspect that the most likely cause of her acute, sharp chest pain was costochondritis  from recurrent emesis, now resolved. She may have had some esophageal spasm from recurrent emesis that would have been relieved by nitroglycerine ECG this AM, when she was feeling better, shows prominent T wave inversion and QT prolongation Will repeat ECG TTE is pending Her risk of atheromatous CAD is very low; coronary dissection is a possibility at her age, but I would have expected a larger event, and I would not have expected it to resolve spontaneously. Coronary vasospasm is another possibility. TTE pending; stress-induced cardiomyopathy is another possibility  Prolonged QT Avoid QT prolonging medications She recently received zofran  Keep magnesium  > 2. Will order 2mg  now.  Acute pancreatitis Per primary  Hypertension Pain and discomfort are likely contributing       For questions or updates, please contact [facility] Please consult [email or web address] for contact info under Cardiology/STEMI.  Signed, Marlane Silver, MD 02/13/2024 10:19 AM  ADDENDUM: I personally reviewed the echocardiogram.  Appears to be a large apical defect consistent with stress-induced cardiomyopathy.  EF appears to be significantly depressed, but I will defer to the cardiologist reading the echo for final quantification.  Her extremities are warm and well-perfused, no JVD, no rales --no evidence of acute decompensation.  I will go ahead and start a low-dose of beta-blocker and will continue to monitor. If BP remains normal or elevated, would add a low dose ARB for afterload reduction. We will need to titrate GDMT, but will also need to consider the plan for management of her gallstone pancreatitis.

## 2024-02-13 NOTE — Progress Notes (Addendum)
 PHARMACY - ANTICOAGULATION CONSULT NOTE  Pharmacy Consult for IV Heparin  Indication: LV thrombus   No Known Allergies  Patient Measurements: Height: 5\' 1"  (154.9 cm) Weight: 59 kg (130 lb) IBW/kg (Calculated) : 47.8 HEPARIN  DW (KG): 59  Vital Signs: Temp: 98.1 F (36.7 C) (05/17 2030) Temp Source: Oral (05/17 2030) BP: 118/92 (05/17 2103) Pulse Rate: 89 (05/17 2103)  Labs: Recent Labs    02/12/24 0106 02/12/24 1830 02/13/24 0122 02/13/24 0247 02/13/24 0615 02/13/24 0754 02/13/24 0942 02/13/24 2106  HGB 10.5*  --  11.3*  --   --   --  11.6*  --   HCT 32.7*  --  34.2*  --   --   --  35.7*  --   PLT 89*  --  86*  --   --   --  91*  --   HEPARINUNFRC  --   --   --   --   --   --   --  0.63  CREATININE 0.85  --  0.66  --   --   --  0.54  --   TROPONINIHS  --    < > 360* 336* 265* 222*  --   --    < > = values in this interval not displayed.    Estimated Creatinine Clearance: 88.8 mL/min (by C-G formula based on SCr of 0.54 mg/dL).   Medical History: Past Medical History:  Diagnosis Date   Acute blood loss anemia 03/01/2022   Anemia    Anxiety    COVID-19 affecting pregnancy in third trimester 02/04/2022   +01/28/2022   Endometritis 03/05/2022   Hypertension    Postpartum septic endometritis 03/09/2022   UTI (urinary tract infection) 05/26/2023    Medications:  Medications Prior to Admission  Medication Sig Dispense Refill Last Dose/Taking   acetaminophen  (TYLENOL ) 500 MG tablet Take 2 tablets (1,000 mg total) by mouth every 8 (eight) hours as needed (pain). 60 tablet 0 Past Week   amLODipine  (NORVASC ) 5 MG tablet Take 1 tablet (5 mg total) by mouth daily. 30 tablet 0 02/10/2024   ibuprofen  (ADVIL ) 200 MG tablet Take 200 mg by mouth every 6 (six) hours as needed.   Past Week   oxyCODONE  (OXY IR/ROXICODONE ) 5 MG immediate release tablet Take 1 tablet (5 mg total) by mouth every 6 (six) hours as needed for up to 4 doses for moderate pain (pain score 4-6) or severe  pain (pain score 7-10). 4 tablet 0 Taking As Needed   pantoprazole  (PROTONIX ) 40 MG tablet Take 1 tablet (40 mg total) by mouth daily. 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 No PTA anticoagulation. Pt did receive dose of enoxaparin  40 mg on 5/17 at 0906.   Today, 02/13/24 Hgb 11.6, plt 91 Plt low but have been stable.  SCr 0.54 mg/dl, Crcl 89 ml/min   5784 1st HL 0.63 therapeutic on 1000 units/hr No bleeding per RN  Goal of Therapy:  Heparin  level 0.3-0.7 units/ml Monitor platelets by anticoagulation protocol: Yes   Plan:  Continue heparin  drip at 1000 units/hr Confirmatory level in 6 hours Daily CBC  Monitor for signs and symptoms of bleeding    Beau Bound RPh 02/13/2024, 9:55 PM

## 2024-02-13 NOTE — Progress Notes (Signed)
 Echocardiogram 2D Echocardiogram has been performed.  Emmaline Haring [place] Straus RDCS 02/13/2024, 10:46 AM

## 2024-02-14 DIAGNOSIS — K851 Biliary acute pancreatitis without necrosis or infection: Secondary | ICD-10-CM | POA: Diagnosis not present

## 2024-02-14 DIAGNOSIS — I5021 Acute systolic (congestive) heart failure: Secondary | ICD-10-CM | POA: Diagnosis not present

## 2024-02-14 DIAGNOSIS — I5181 Takotsubo syndrome: Secondary | ICD-10-CM | POA: Diagnosis not present

## 2024-02-14 LAB — COMPREHENSIVE METABOLIC PANEL WITH GFR
ALT: 34 U/L (ref 0–44)
AST: 54 U/L — ABNORMAL HIGH (ref 15–41)
Albumin: 3.9 g/dL (ref 3.5–5.0)
Alkaline Phosphatase: 87 U/L (ref 38–126)
Anion gap: 13 (ref 5–15)
BUN: <5 mg/dL — ABNORMAL LOW (ref 6–20)
CO2: 23 mmol/L (ref 22–32)
Calcium: 9.1 mg/dL (ref 8.9–10.3)
Chloride: 97 mmol/L — ABNORMAL LOW (ref 98–111)
Creatinine, Ser: 0.56 mg/dL (ref 0.44–1.00)
GFR, Estimated: >60 mL/min (ref >60)
Glucose, Bld: 139 mg/dL — ABNORMAL HIGH (ref 70–99)
Potassium: 3.6 mmol/L (ref 3.5–5.1)
Sodium: 133 mmol/L — ABNORMAL LOW (ref 135–145)
Total Bilirubin: 1.4 mg/dL — ABNORMAL HIGH (ref 0.0–1.2)
Total Protein: 7.7 g/dL (ref 6.5–8.1)

## 2024-02-14 LAB — MAGNESIUM
Magnesium: 2 mg/dL (ref 1.7–2.4)
Magnesium: 1.9 mg/dL (ref 1.7–2.4)

## 2024-02-14 LAB — CBC
HCT: 40.9 % (ref 36.0–46.0)
Hemoglobin: 13.3 g/dL (ref 12.0–15.0)
MCH: 33.4 pg (ref 26.0–34.0)
MCHC: 32.5 g/dL (ref 30.0–36.0)
MCV: 102.8 fL — ABNORMAL HIGH (ref 80.0–100.0)
Platelets: 97 10*3/uL — ABNORMAL LOW (ref 150–400)
RBC: 3.98 MIL/uL (ref 3.87–5.11)
RDW: 13.1 % (ref 11.5–15.5)
WBC: 6.3 10*3/uL (ref 4.0–10.5)
nRBC: 0 % (ref 0.0–0.2)

## 2024-02-14 LAB — PHOSPHORUS: Phosphorus: 2.8 mg/dL (ref 2.5–4.6)

## 2024-02-14 LAB — HEPARIN LEVEL (UNFRACTIONATED): Heparin Unfractionated: 0.66 [IU]/mL (ref 0.30–0.70)

## 2024-02-14 MED ORDER — MORPHINE SULFATE (PF) 2 MG/ML IV SOLN
2.0000 mg | Freq: Once | INTRAVENOUS | Status: AC
  Administered 2024-02-14: 2 mg via INTRAVENOUS
  Filled 2024-02-14: qty 1

## 2024-02-14 MED ORDER — POLYETHYLENE GLYCOL 3350 17 G PO PACK
17.0000 g | PACK | Freq: Two times a day (BID) | ORAL | Status: DC
  Administered 2024-02-14 – 2024-02-18 (×6): 17 g via ORAL
  Filled 2024-02-14 (×7): qty 1

## 2024-02-14 MED ORDER — HYDROMORPHONE HCL 1 MG/ML IJ SOLN
0.5000 mg | INTRAMUSCULAR | Status: DC | PRN
  Administered 2024-02-14 – 2024-02-17 (×10): 1 mg via INTRAVENOUS
  Administered 2024-02-17 – 2024-02-18 (×3): 0.5 mg via INTRAVENOUS
  Filled 2024-02-14: qty 1
  Filled 2024-02-14: qty 0.5
  Filled 2024-02-14 (×2): qty 1
  Filled 2024-02-14 (×2): qty 0.5
  Filled 2024-02-14 (×7): qty 1

## 2024-02-14 MED ORDER — POTASSIUM CHLORIDE CRYS ER 20 MEQ PO TBCR
40.0000 meq | EXTENDED_RELEASE_TABLET | Freq: Once | ORAL | Status: AC
  Administered 2024-02-14: 40 meq via ORAL
  Filled 2024-02-14: qty 2

## 2024-02-14 MED ORDER — SACUBITRIL-VALSARTAN 24-26 MG PO TABS
1.0000 | ORAL_TABLET | Freq: Two times a day (BID) | ORAL | Status: DC
  Administered 2024-02-14 – 2024-02-16 (×6): 1 via ORAL
  Filled 2024-02-14 (×8): qty 1

## 2024-02-14 MED ORDER — HYDROMORPHONE HCL 1 MG/ML IJ SOLN
1.0000 mg | INTRAMUSCULAR | Status: AC
  Administered 2024-02-14: 1 mg via INTRAVENOUS
  Filled 2024-02-14: qty 1

## 2024-02-14 NOTE — Progress Notes (Signed)
 PHARMACY - ANTICOAGULATION CONSULT NOTE  Pharmacy Consult for IV Heparin  Indication: LV thrombus   No Known Allergies  Patient Measurements: Height: 5\' 1"  (154.9 cm) Weight: 59 kg (130 lb) IBW/kg (Calculated) : 47.8 HEPARIN  DW (KG): 59  Vital Signs: Temp: 98 F (36.7 C) (05/17 2327) Temp Source: Oral (05/17 2327) BP: 129/105 (05/17 2327) Pulse Rate: 79 (05/17 2327)  Labs: Recent Labs    02/13/24 0122 02/13/24 0247 02/13/24 0615 02/13/24 0754 02/13/24 0942 02/13/24 2106 02/14/24 0345  HGB 11.3*  --   --   --  11.6*  --  13.3  HCT 34.2*  --   --   --  35.7*  --  40.9  PLT 86*  --   --   --  91*  --  97*  HEPARINUNFRC  --   --   --   --   --  0.63 0.66  CREATININE 0.66  --   --   --  0.54  --  0.56  TROPONINIHS 360* 336* 265* 222*  --   --   --     Estimated Creatinine Clearance: 88.8 mL/min (by C-G formula based on SCr of 0.56 mg/dL).   Medical History: Past Medical History:  Diagnosis Date   Acute blood loss anemia 03/01/2022   Anemia    Anxiety    COVID-19 affecting pregnancy in third trimester 02/04/2022   +01/28/2022   Endometritis 03/05/2022   Hypertension    Postpartum septic endometritis 03/09/2022   UTI (urinary tract infection) 05/26/2023    Medications:  Medications Prior to Admission  Medication Sig Dispense Refill Last Dose/Taking   acetaminophen  (TYLENOL ) 500 MG tablet Take 2 tablets (1,000 mg total) by mouth every 8 (eight) hours as needed (pain). 60 tablet 0 Past Week   amLODipine  (NORVASC ) 5 MG tablet Take 1 tablet (5 mg total) by mouth daily. 30 tablet 0 02/10/2024   ibuprofen  (ADVIL ) 200 MG tablet Take 200 mg by mouth every 6 (six) hours as needed.   Past Week   oxyCODONE  (OXY IR/ROXICODONE ) 5 MG immediate release tablet Take 1 tablet (5 mg total) by mouth every 6 (six) hours as needed for up to 4 doses for moderate pain (pain score 4-6) or severe pain (pain score 7-10). 4 tablet 0 Taking As Needed   pantoprazole  (PROTONIX ) 40 MG tablet Take  1 tablet (40 mg total) by mouth daily. 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 No PTA anticoagulation. Pt did receive dose of enoxaparin  40 mg on 5/17 at 0906.   Today, 02/14/24 HL 0.66 therapeutic on 1000 units/hr Hgb 13.3, plts 97 No bleeding per RN  Goal of Therapy:  Heparin  level 0.3-0.7 units/ml Monitor platelets by anticoagulation protocol: Yes   Plan:  Continue heparin  drip at 1000 units/hr Daily CBC  Monitor for signs and symptoms of bleeding    Beau Bound RPh 02/14/2024, 4:15 AM

## 2024-02-14 NOTE — Progress Notes (Signed)
 PROGRESS NOTE  Melissa Guzman  YNW:[id number] DOB: 1996-08-20 DOA: 02/11/2024 PCP: [facility]  Brief Narrative: 28 y.o. female with medical history significant for anxiety, HTN, GERD, gallstone pancreatitis s/p attempted cholecystectomy aborted due to adhesions in Jan 2025, hepatic steatosis who presented to [facility] for recurrent abdominal pain, nausea and vomiting. Patient admitted diagnosis: 4/28 2/30 for similar presentation. She reports improvement in her abdominal symptoms for 3 to 4 days however the symptoms worsened after that.  Over the last 2 weeks, she has had more than 20 episodes of nonbloody nonbilious emesis.  Patient is scheduled for cholecystectomy on June 6.  Came to ED due to nausea and vomiting and pain.  Admitted for concern for recurrent pancreatitis.  Had episode of chest pain evening of 5/16.  Troponins elevated and cardiology consulted.  Echo obtained.  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 - Chest pain has been now mostly resolved. - On metoprolol and starting Entresto today. - Agree that fluid status looks good today as well.   - Continue his course.  Plan will be to transfer her to [facility] progressive unit today.  Orders RN.  Likely LV thrombus: - Had been to treat, continue  Prolonged QTc: - She had previously been receiving Zofran  for her nausea and vomiting. - No further nausea or vomiting. -I have switched her to Compazine , although most antiemetics have risk of prolonged QT. - Goal mag > 2 and potassium >4.  Replaced today.  # Acute pancreatitis - Initial reason for hospitalization. - We switched her to morphine  yesterday which seemed to do well.  However today she is having more abdominal pain and I switched her back to hydromorphone . - Nausea is improved. - She is on full liquid diet. - Plan on admission was for general surgery consult when she improved  this admission for consideration of cholecystectomy.  That has now changed in light of problem 1 above.  Recommend consult general surgery on 5/19 for further discussion about timing of surgery --> she has had several bouts of pancreatitis since January of this year.  Anion gap metabolic acidosis: Secondary to ongoing nausea and vomiting, now resolved   # Elevated LFTs Downtrending  # Hypokalemia - Secondary to nausea vomiting. - Rechecking.  See goals above.   # Hypomagnesemia -Mag 1.6, status post IV mag here -Trend   # HTN -BP elevated with SBP in the 140s in the setting of acute pain - Now on metoprolol and Entresto, will follow   # GERD -Continue Protonix   Macrocytic anemia: - B12 good.  Unclear about her alcohol usage.  Thrombocytopenia: - Noted and will need to trend in light of heparin  usage. Stable  Low A1c: - Her A1c is very low at 3.8.  Likely some degree of anemia playing into how low her A1c is, but that said no need for ongoing CBG checks.     DVT prophylaxis:  Heparin  Code Status:   Code Status: Full Code Level of care: Progressive Status is: Inpatient Dispo: Transfer to [facility] progressive unit, orders have been placed  Consults called: Cardiology  Subjective: Abdominal pain is a little worse this morning.  No further nausea or vomiting.  No respiratory distress  Objective: Vitals:   02/13/24 2327 02/14/24 0556 02/14/24 0801 02/14/24 0809  BP: (!) 129/105 (!) 135/107 (!) 129/110   Pulse: 79 93 (!) 110 (!) 105  Resp:   18 16  Temp:  98 F (36.7 C) 98.7 F (37.1 C)    TempSrc: Oral Oral    SpO2: 99%     Weight:      Height:       No intake or output data in the 24 hours ending 02/14/24 1509  Filed Weights   02/11/24 1057  Weight: 59 kg   Body mass index is 24.56 kg/m.  Gen: 28 y.o. female in no apparent distress.  Nontoxic Pulm: Non-labored breathing.  Clear to auscultation bilaterally.  CV: Regular rate and rhythm.  GI: Abdomen  soft, nondistended.  Tender to palpation in epigastrium.  Good bowel sounds throughout.  No guarding or rebound. Ext: Warm, no deformities, no pedal edema Skin: No rashes, lesions no ulcers Neuro: Alert and oriented. No focal neurological deficits. Psych: Calm  Judgement and insight appear normal. Mood & affect appropriate.     I have personally reviewed the following labs and images: CBC: Recent Labs  Lab 02/11/24 1104 02/11/24 1627 02/12/24 0106 02/13/24 0122 02/13/24 0942 02/14/24 0345  WBC 5.1  --  4.3 4.8 4.7 6.3  HGB 11.8* 11.9* 10.5* 11.3* 11.6* 13.3  HCT 35.7* 35.0* 32.7* 34.2* 35.7* 40.9  MCV 102.0*  --  104.5* 102.4* 103.5* 102.8*  PLT 127*  --  89* 86* 91* 97*   BMP &GFR Recent Labs  Lab 02/11/24 1104 02/11/24 1437 02/11/24 1627 02/12/24 0106 02/13/24 0122 02/13/24 0942 02/14/24 0344 02/14/24 0345 02/14/24 1047  NA 145  --  137 135 134* 132*  --  133*  --   K 3.1*  --  3.3* 3.3* 3.3* 3.0*  --  3.6  --   CL 92*  --   --  98 97* 95*  --  97*  --   CO2 22  --   --  20* 25 23  --  23  --   GLUCOSE 73  --   --  130* 113* 117*  --  139*  --   BUN 6  --   --  <5* <5* <5*  --  <5*  --   CREATININE 0.91  --   --  0.85 0.66 0.54  --  0.56  --   CALCIUM 10.0  --   --  8.6* 8.9 8.8*  --  9.1  --   MG  --    < >  --  1.7 1.3* 1.8 2.0  --  1.9  PHOS  --   --   --  3.1 2.3* 2.3* 2.8  --   --    < > = values in this interval not displayed.   Estimated Creatinine Clearance: 88.8 mL/min (by C-G formula based on SCr of 0.56 mg/dL). Liver & Pancreas: Recent Labs  Lab 02/11/24 1104 02/12/24 0106 02/13/24 0122 02/14/24 0345  AST 360* 124* 68* 54*  ALT 73* 45* 34 34  ALKPHOS 134* 92 82 87  BILITOT 1.3* 2.1* 1.5* 1.4*  PROT 8.4* 7.5 6.9 7.7  ALBUMIN 4.7 3.9 3.5 3.9   Recent Labs  Lab 02/11/24 1104 02/13/24 0122  LIPASE 60* 43   No results for input(s): "AMMONIA" in the last 168 hours. Diabetic: Recent Labs    02/12/24 0106  HGBA1C 3.8*   Recent Labs  Lab  02/12/24 0229 02/12/24 0343 02/12/24 0723 02/12/24 1133 02/12/24 1609  GLUCAP 67* 134* 122* 123* 168*   Cardiac Enzymes: No results for input(s): "CKTOTAL", "CKMB", "CKMBINDEX", "TROPONINI" in the last 168 hours. No results for input(s): "PROBNP" in  the last 8760 hours. Coagulation Profile: No results for input(s): "INR", "PROTIME" in the last 168 hours. Thyroid  Function Tests: No results for input(s): "TSH", "T4TOTAL", "FREET4", "T3FREE", "THYROIDAB" in the last 72 hours. Lipid Profile: No results for input(s): "CHOL", "HDL", "LDLCALC", "TRIG", "CHOLHDL", "LDLDIRECT" in the last 72 hours. Anemia Panel: No results for input(s): "VITAMINB12", "FOLATE", "FERRITIN", "TIBC", "IRON ", "RETICCTPCT" in the last 72 hours. Urine analysis:    Component Value Date/Time   COLORURINE YELLOW 02/11/2024 1104   APPEARANCEUR CLEAR 02/11/2024 1104   LABSPEC 1.025 02/11/2024 1104   PHURINE 6.0 02/11/2024 1104   GLUCOSEU NEGATIVE 02/11/2024 1104   HGBUR TRACE (A) 02/11/2024 1104   BILIRUBINUR NEGATIVE 02/11/2024 1104   KETONESUR >80 (A) 02/11/2024 1104   PROTEINUR >300 (A) 02/11/2024 1104   UROBILINOGEN 0.2 08/19/2023 1540   NITRITE NEGATIVE 02/11/2024 1104   LEUKOCYTESUR NEGATIVE 02/11/2024 1104   Sepsis Labs: Invalid input(s): "PROCALCITONIN", "LACTICIDVEN"  Microbiology: Recent Results (from the past 240 hours)  Group A Strep by PCR     Status: None   Collection Time: 02/11/24  3:18 PM   Specimen: Urine, Clean Catch; Sterile Swab  Result Value Ref Range Status   Group A Strep by PCR NOT DETECTED NOT DETECTED Final    Comment: Performed at [facility], [address], [place], [place] [address]    Radiology Studies: No results found.   Scheduled Meds:  feeding supplement  237 mL Oral BID BM   metoprolol tartrate  12.5 mg Oral BID   pantoprazole   40 mg Oral Daily   polyethylene glycol  17 g Oral BID   sacubitril-valsartan  1 tablet Oral BID   Continuous  Infusions:  heparin  1,000 Units/hr (02/14/24 1252)     LOS: 3 days   35 minutes with more than 50% spent in reviewing records, counseling patient/family and coordinating care.  Trenton Frock, MD [facility] [email or web address] 02/14/2024, 3:09 PM

## 2024-02-14 NOTE — Progress Notes (Signed)
 Progress Note  Patient Name: Melissa Guzman Date of Encounter: 02/14/2024  Primary Cardiologist: None   Subjective   Having ongoing abdominal pain. No significant chest pain at present; nausea and emesis are improving.  Inpatient Medications    Scheduled Meds:  amLODipine   5 mg Oral Daily   feeding supplement  237 mL Oral BID BM   metoprolol tartrate  12.5 mg Oral BID   pantoprazole   40 mg Oral Daily   polyethylene glycol  17 g Oral BID   Continuous Infusions:  heparin  1,000 Units/hr (02/13/24 1443)   PRN Meds: acetaminophen  **OR** acetaminophen , alum & mag hydroxide-simeth, morphine  injection, nitroGLYCERIN , prochlorperazine , senna-docusate   Vital Signs    Vitals:   02/13/24 2327 02/14/24 0556 02/14/24 0801 02/14/24 0809  BP: (!) 129/105 (!) 135/107 (!) 129/110   Pulse: 79 93 (!) 110 (!) 105  Resp:   18 16  Temp: 98 F (36.7 C) 98.7 F (37.1 C)    TempSrc: Oral Oral    SpO2: 99%     Weight:      Height:        Intake/Output Summary (Last 24 hours) at 02/14/2024 0923 Last data filed at 02/13/2024 1506 Gross per 24 hour  Intake 313.54 ml  Output --  Net 313.54 ml   Filed Weights   02/11/24 1057  Weight: 59 kg    Telemetry    Sinus tachycardia, QT prolongation - Personally Reviewed  ECG    No new - Personally Reviewed  Physical Exam   GEN: No acute distress.   Neck: No JVD Cardiac: tachy, regular, no murmurs, rubs, or gallops.  Respiratory: Clear to auscultation bilaterally. GI: Soft, nontender, non-distended  MS: No edema; No deformity. Neuro:  Nonfocal  Psych: Normal affect   Labs    Chemistry Recent Labs  Lab 02/12/24 0106 02/13/24 0122 02/13/24 0942 02/14/24 0345  NA 135 134* 132* 133*  K 3.3* 3.3* 3.0* 3.6  CL 98 97* 95* 97*  CO2 20* 25 23 23   GLUCOSE 130* 113* 117* 139*  BUN <5* <5* <5* <5*  CREATININE 0.85 0.66 0.54 0.56  CALCIUM 8.6* 8.9 8.8* 9.1  PROT 7.5 6.9  --  7.7  ALBUMIN 3.9 3.5  --  3.9  AST 124* 68*   --  54*  ALT 45* 34  --  34  ALKPHOS 92 82  --  87  BILITOT 2.1* 1.5*  --  1.4*  GFRNONAA >60 >60 >60 >60  ANIONGAP 17* 12 14 13      Hematology Recent Labs  Lab 02/13/24 0122 02/13/24 0942 02/14/24 0345  WBC 4.8 4.7 6.3  RBC 3.34* 3.45* 3.98  HGB 11.3* 11.6* 13.3  HCT 34.2* 35.7* 40.9  MCV 102.4* 103.5* 102.8*  MCH 33.8 33.6 33.4  MCHC 33.0 32.5 32.5  RDW 13.4 13.4 13.1  PLT 86* 91* 97*    Cardiac EnzymesNo results for input(s): "TROPONINI" in the last 168 hours. No results for input(s): "TROPIPOC" in the last 168 hours.   BNPNo results for input(s): "BNP", "PROBNP" in the last 168 hours.   DDimer No results for input(s): "DDIMER" in the last 168 hours.   Summary of Pertinent studies    TTE: LVEF 20 to 25%.  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  Grade 2 diastolic dysfunction.  LV thrombus noted.  Right ventricular systolic function is moderately reduced.  Imaging: No new  Labs: TnI peaked at 360  Patient Profile  28 y.o. 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  Assessment & Plan    Stress-induced cardiomyopathy An episode of retching and emesis preceded an episode of chest pain during which EKG showed nonspecific T wave changes, followed by mild troponin elevation, subsequent T wave inversion and QT prolongation; no ST elevation. I think the risk of coronary cath would outweigh the benefit due to her pretest risk of CAD, lack of typical ST changes of acute MI, extent of WMA out of proportion to her mild clinical syndrome and troponin elevation, and the classical findings of stress-induced cardiomyopathy (multiple coronary territories involved).  Titrating GDMT: Metoprolol added yesterday, may titrate dose; consider switching to carvedilol if BP elevated Will start entresto today DC  amlodipine   She appears euvolemic and well-compensated Between acute pancreatitis and acute cardiomyopathy, fluid status is a fine line. Strict I&Os. At this time, I think we need to keep her volume status fairly even.  LV thrombus Continue heparin  drip Will need to transition to warfarin prior to discharge  Acute pancreatitis Nausea, appetite improving  Hypomagnesemia Aggressively treat to keep magnesium  greater than 2 in the setting of QT prolongation from stress-induced cardiomyopathy as well as antiemetics  Prolonged QT Often seen in the setting of stress-induced cardiomyopathy Will be exacerbated by antiemetics -- do not give QT prolonging medications Aggressively treat hypomagnesemia Check daily ECGs    For questions or updates, please contact [facility] Please consult [email or web address] for contact info under Cardiology/STEMI.      Signed, Efraim Grange, MD 02/14/2024, 9:23 AM

## 2024-02-15 ENCOUNTER — Inpatient Hospital Stay (HOSPITAL_COMMUNITY)

## 2024-02-15 DIAGNOSIS — K859 Acute pancreatitis without necrosis or infection, unspecified: Secondary | ICD-10-CM

## 2024-02-15 DIAGNOSIS — I5041 Acute combined systolic (congestive) and diastolic (congestive) heart failure: Secondary | ICD-10-CM

## 2024-02-15 DIAGNOSIS — I513 Intracardiac thrombosis, not elsewhere classified: Secondary | ICD-10-CM | POA: Diagnosis not present

## 2024-02-15 DIAGNOSIS — I5181 Takotsubo syndrome: Secondary | ICD-10-CM | POA: Diagnosis not present

## 2024-02-15 DIAGNOSIS — K851 Biliary acute pancreatitis without necrosis or infection: Secondary | ICD-10-CM | POA: Diagnosis not present

## 2024-02-15 LAB — MAGNESIUM: Magnesium: 1.7 mg/dL (ref 1.7–2.4)

## 2024-02-15 LAB — COMPREHENSIVE METABOLIC PANEL WITH GFR
ALT: 28 U/L (ref 0–44)
AST: 44 U/L — ABNORMAL HIGH (ref 15–41)
Albumin: 3.6 g/dL (ref 3.5–5.0)
Alkaline Phosphatase: 70 U/L (ref 38–126)
Anion gap: 14 (ref 5–15)
BUN: 5 mg/dL — ABNORMAL LOW (ref 6–20)
CO2: 26 mmol/L (ref 22–32)
Calcium: 9 mg/dL (ref 8.9–10.3)
Chloride: 94 mmol/L — ABNORMAL LOW (ref 98–111)
Creatinine, Ser: 0.51 mg/dL (ref 0.44–1.00)
GFR, Estimated: >60 mL/min (ref >60)
Glucose, Bld: 105 mg/dL — ABNORMAL HIGH (ref 70–99)
Potassium: 3.5 mmol/L (ref 3.5–5.1)
Sodium: 134 mmol/L — ABNORMAL LOW (ref 135–145)
Total Bilirubin: 1.2 mg/dL (ref 0.0–1.2)
Total Protein: 7.1 g/dL (ref 6.5–8.1)

## 2024-02-15 LAB — CBC
HCT: 39.8 % (ref 36.0–46.0)
Hemoglobin: 12.6 g/dL (ref 12.0–15.0)
MCH: 33.8 pg (ref 26.0–34.0)
MCHC: 31.7 g/dL (ref 30.0–36.0)
MCV: 106.7 fL — ABNORMAL HIGH (ref 80.0–100.0)
Platelets: 91 10*3/uL — ABNORMAL LOW (ref 150–400)
RBC: 3.73 MIL/uL — ABNORMAL LOW (ref 3.87–5.11)
RDW: 13 % (ref 11.5–15.5)
WBC: 6.1 10*3/uL (ref 4.0–10.5)
nRBC: 0 % (ref 0.0–0.2)

## 2024-02-15 LAB — HEPARIN LEVEL (UNFRACTIONATED): Heparin Unfractionated: 0.38 [IU]/mL (ref 0.30–0.70)

## 2024-02-15 LAB — PHOSPHORUS: Phosphorus: 3.2 mg/dL (ref 2.5–4.6)

## 2024-02-15 MED ORDER — POTASSIUM CHLORIDE CRYS ER 20 MEQ PO TBCR
40.0000 meq | EXTENDED_RELEASE_TABLET | Freq: Once | ORAL | Status: AC
  Administered 2024-02-15: 40 meq via ORAL
  Filled 2024-02-15: qty 2

## 2024-02-15 MED ORDER — MAGNESIUM SULFATE 2 GM/50ML IV SOLN
2.0000 g | Freq: Once | INTRAVENOUS | Status: AC
  Administered 2024-02-15: 2 g via INTRAVENOUS
  Filled 2024-02-15: qty 50

## 2024-02-15 MED ORDER — PRENATAL MULTIVITAMIN CH
1.0000 | ORAL_TABLET | Freq: Every day | ORAL | Status: DC
  Administered 2024-02-15 – 2024-02-18 (×3): 1 via ORAL
  Filled 2024-02-15 (×5): qty 1

## 2024-02-15 MED ORDER — ACETAMINOPHEN 325 MG PO TABS
ORAL_TABLET | ORAL | Status: AC
  Filled 2024-02-15: qty 2

## 2024-02-15 MED ORDER — GADOBUTROL 1 MMOL/ML IV SOLN
9.0000 mL | Freq: Once | INTRAVENOUS | Status: AC | PRN
  Administered 2024-02-15: 9 mL via INTRAVENOUS

## 2024-02-15 NOTE — Progress Notes (Signed)
 PROGRESS NOTE  Melissa Guzman  GUY:[id number] DOB: 12/11/1995 DOA: 02/11/2024 PCP: [facility]     Brief Narrative: 28 y.o. female with medical history significant for anxiety, HTN, GERD, gallstone pancreatitis s/p attempted cholecystectomy aborted due to adhesions in Jan 2025, hepatic steatosis who presented to [facility] for recurrent abdominal pain, nausea and vomiting. Patient admitted diagnosis: 4/28-4/30 by [facility] for similar presentation. She reports improvement in her abdominal symptoms for 3 to 4 days however the symptoms worsened after that.   Patient is scheduled for cholecystectomy on June 6 however came to ED due to nausea and vomiting and pain.  Admitted for concern for recurrent pancreatitis.  Had episode of chest pain evening of 5/16.  Troponins elevated and cardiology consulted.  Echo obtained with a decreased EF of 20-25%.     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 - On metoprolol /entresto  - appears euvolemic    - Plan will be to transfer her to [facility] unit today-- needs cardiac MRI  Likely LV thrombus: - heparin  gtt  Prolonged QTc: - avoid prolonging agents - Goal mag > 2 and potassium >4   Acute pancreatitis - Initial reason for hospitalization. - Nausea is improved. - She is on full liquid diet. -PRN pain meds - Plan on admission was for general surgery consult when she improved this admission for consideration of cholecystectomy after cardiology evaluation  Anion gap metabolic acidosis: - resolved   Elevated LFTs Downtrending  Hypokalemia - repelte to 4 if able   Hypomagnesemia -replete to 2  HTN -getting GDT per cards   GERD -Continue Protonix   Macrocytic anemia: - B12 good  Thrombocytopenia: - trend  Low A1c: - Her A1c is very low at 3.8.  Likely some degree of anemia playing into how low her A1c is, but  that said no need for ongoing CBG checks.    DVT prophylaxis:  Heparin  Gtt  Code Status:   Code Status: Full Code Level of care: Progressive Status is: Inpatient Dispo: Transfer to [facility] progressive unit, orders have been placed  Consults called: Cardiology  Subjective: Knows she is getting cardiac MRI and being [facility]-- doing well currently  Objective: Vitals:   02/14/24 0809 02/14/24 1549 02/14/24 2107 02/15/24 0348  BP:  (!) 130/100 (!) 133/102 109/83  Pulse: (!) 105 90 95 86  Resp: 16  16 20   Temp:  98.1 F (36.7 C) 97.9 F (36.6 C) 98.1 F (36.7 C)  TempSrc:  Oral Oral Oral  SpO2:  99% 100% 95%  Weight:      Height:        Intake/Output Summary (Last 24 hours) at 02/15/2024 1009 Last data filed at 02/15/2024 0400 Gross per 24 hour  Intake 90 ml  Output --  Net 90 ml    Filed Weights   02/11/24 1057  Weight: 59 kg   Body mass index is 24.56 kg/m.   General: Appearance:    Well developed, well nourished female in no acute distress     Lungs:    respirations unlabored  Heart:    Normal heart rate. Normal rhythm. No murmurs, rubs, or gallops.   MS:   All extremities are intact.   Neurologic:   Awake, alert     I have personally reviewed the following labs and images: CBC: Recent Labs  Lab 02/12/24 0106 02/13/24 0122 02/13/24 0942 02/14/24 0345 02/15/24  0327  WBC 4.3 4.8 4.7 6.3 6.1  HGB 10.5* 11.3* 11.6* 13.3 12.6  HCT 32.7* 34.2* 35.7* 40.9 39.8  MCV 104.5* 102.4* 103.5* 102.8* 106.7*  PLT 89* 86* 91* 97* 91*   BMP &GFR Recent Labs  Lab 02/12/24 0106 02/13/24 0122 02/13/24 0942 02/14/24 0344 02/14/24 0345 02/14/24 1047 02/15/24 0327  NA 135 134* 132*  --  133*  --  134*  K 3.3* 3.3* 3.0*  --  3.6  --  3.5  CL 98 97* 95*  --  97*  --  94*  CO2 20* 25 23  --  23  --  26  GLUCOSE 130* 113* 117*  --  139*  --  105*  BUN <5* <5* <5*  --  <5*  --  5*  CREATININE 0.85 0.66 0.54  --  0.56  --  0.51  CALCIUM 8.6* 8.9  8.8*  --  9.1  --  9.0  MG 1.7 1.3* 1.8 2.0  --  1.9 1.7  PHOS 3.1 2.3* 2.3* 2.8  --   --  3.2   Estimated Creatinine Clearance: 88.8 mL/min (by C-G formula based on SCr of 0.51 mg/dL). Liver & Pancreas: Recent Labs  Lab 02/11/24 1104 02/12/24 0106 02/13/24 0122 02/14/24 0345 02/15/24 0327  AST 360* 124* 68* 54* 44*  ALT 73* 45* 34 34 28  ALKPHOS 134* 92 82 87 70  BILITOT 1.3* 2.1* 1.5* 1.4* 1.2  PROT 8.4* 7.5 6.9 7.7 7.1  ALBUMIN 4.7 3.9 3.5 3.9 3.6   Recent Labs  Lab 02/11/24 1104 02/13/24 0122  LIPASE 60* 43   No results for input(s): "AMMONIA" in the last 168 hours. Diabetic: No results for input(s): "HGBA1C" in the last 72 hours.  Recent Labs  Lab 02/12/24 0229 02/12/24 0343 02/12/24 0723 02/12/24 1133 02/12/24 1609  GLUCAP 67* 134* 122* 123* 168*   Cardiac Enzymes: No results for input(s): "CKTOTAL", "CKMB", "CKMBINDEX", "TROPONINI" in the last 168 hours. No results for input(s): "PROBNP" in the last 8760 hours. Coagulation Profile: No results for input(s): "INR", "PROTIME" in the last 168 hours. Thyroid  Function Tests: No results for input(s): "TSH", "T4TOTAL", "FREET4", "T3FREE", "THYROIDAB" in the last 72 hours. Lipid Profile: No results for input(s): "CHOL", "HDL", "LDLCALC", "TRIG", "CHOLHDL", "LDLDIRECT" in the last 72 hours. Anemia Panel: No results for input(s): "VITAMINB12", "FOLATE", "FERRITIN", "TIBC", "IRON ", "RETICCTPCT" in the last 72 hours. Urine analysis:    Component Value Date/Time   COLORURINE YELLOW 02/11/2024 1104   APPEARANCEUR CLEAR 02/11/2024 1104   LABSPEC 1.025 02/11/2024 1104   PHURINE 6.0 02/11/2024 1104   GLUCOSEU NEGATIVE 02/11/2024 1104   HGBUR TRACE (A) 02/11/2024 1104   BILIRUBINUR NEGATIVE 02/11/2024 1104   KETONESUR >80 (A) 02/11/2024 1104   PROTEINUR >300 (A) 02/11/2024 1104   UROBILINOGEN 0.2 08/19/2023 1540   NITRITE NEGATIVE 02/11/2024 1104   LEUKOCYTESUR NEGATIVE 02/11/2024 1104   Sepsis Labs: Invalid  input(s): "PROCALCITONIN", "LACTICIDVEN"  Microbiology: Recent Results (from the past 240 hours)  Group A Strep by PCR     Status: None   Collection Time: 02/11/24  3:18 PM   Specimen: Urine, Clean Catch; Sterile Swab  Result Value Ref Range Status   Group A Strep by PCR NOT DETECTED NOT DETECTED Final    Comment: Performed at [facility], [address] [place], [place] [address]    Radiology Studies: No results found.   Scheduled Meds:  feeding supplement  237 mL Oral BID BM   metoprolol  tartrate  12.5 mg Oral BID   pantoprazole   40 mg Oral Daily   polyethylene glycol  17 g Oral BID   sacubitril -valsartan   1 tablet Oral BID   Continuous Infusions:  heparin  1,000 Units/hr (02/14/24 1252)     LOS: 4 days     Yolandra Habig U Marchello Rothgeb, DO [facility] [email or web address] 02/15/2024, 10:09 AM

## 2024-02-15 NOTE — Hospital Course (Addendum)
 Melissa Guzman is a 28 y.o.female with a history of CAD, HTN, GERD, gallstone pancreatitis s/p attempted cholecystectomy in January 2025 aborted due to adhesions, hepatic steatosis who was admitted originally to [facility] for chest pain, now transferred to the [facility] Teaching Service at [facility].   Her hospital course is detailed below:  Acute pancreatitis On presentation in the ED patient was tachycardic and mildly hypertensive.  Labs significant for hypokalemia, lipase 60, AST 360 and ALT 73; she was also mildly anemic, no leukocytosis.  Hepatitis panel and group A strep negative.  RUQ US  with gallbladder sludge and hepatic steatosis, but no evidence of acute cholecystitis.  Subsequent CT A/P with evidence of acute pancreatitis. She was admitted for IV fluids pain control. General surgery was consulted. Outpatient cholecystectomy on June 6 canceled and future scheduling depending on cardiac clearance after repeat echo outpatient. Goal potassium> 4 and Mg > 2, was discharged with magnesium  supplementation, given need for frequent repletion inpatient.  Stress-induced cardiomyopathy  Acute systolic heart failure On second day of hospitalization patient began experiencing sharp/stabbing chest pain.  Troponins positive and peaked at 360.  EKG with nonspecific lateral T wave changes, and subsequent EKG with QT prolongation.  Electrophysiology was consulted and they recommended an echo; this showed LVEF 20-25% and echodensity suspicious for left ventricular thrombus.  Patient was continued on metoprolol  and started Entresto  24-26 mg twice daily but transitioned to losartan 25 mg daily due to soft BP prior to discharge. 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 Pt was discharged on losartan and metoprolol  and will have outpt repeat echo in next 2 weeks.    Simple Cystitis Patient reported dysuria/odd odor to urine while hospitalized. Urinalysis  showed leukocytes with bacteria. Started on Keflex  inpatient and transitioned to Cefuroxime at time of discharge (complete 5 day total course, last dose 5/25)  Pancytopenia/Macrocytic Anemia Noted at recent hospitalization. No obvious source of bleeding. Folate, B12, and iron  studies recently normal. Was recommended to follow up with heme/onc outpatient for further work up at previous hospitalization.   Other chronic conditions were medically managed with home medications and formulary alternatives as necessary (HTN, GERD)  PCP Follow-up Recommendations: Repeat BMP - Check potassium and magnesium   Repeat CBC - Check Hgb and see if patient has followed up on recommendation to see hematology/oncology outpatient  Ensure follow up with cardiology for repeat echo Ensure follow up with general surgery for cholecystectomy  Continue discussion about birth control given medication regimen currently includes losartan.

## 2024-02-15 NOTE — Progress Notes (Signed)
 PHARMACY - ANTICOAGULATION CONSULT NOTE  Pharmacy Consult for IV Heparin  Indication: LV thrombus   No Known Allergies  Patient Measurements: Height: 5\' 1"  (154.9 cm) Weight: 59 kg (130 lb) IBW/kg (Calculated) : 47.8 HEPARIN  DW (KG): 59  Vital Signs: Temp: 98.1 F (36.7 C) (05/19 0348) Temp Source: Oral (05/19 0348) BP: 109/83 (05/19 0348) Pulse Rate: 86 (05/19 0348)  Labs: Recent Labs    02/13/24 0247 02/13/24 0615 02/13/24 0754 02/13/24 0942 02/13/24 2106 02/14/24 0345 02/15/24 0327  HGB  --   --   --  11.6*  --  13.3 12.6  HCT  --   --   --  35.7*  --  40.9 39.8  PLT  --   --   --  91*  --  97* 91*  HEPARINUNFRC  --   --   --   --  0.63 0.66 0.38  CREATININE  --   --   --  0.54  --  0.56 0.51  TROPONINIHS 336* 265* 222*  --   --   --   --     Estimated Creatinine Clearance: 88.8 mL/min (by C-G formula based on SCr of 0.51 mg/dL).   Medical History: Past Medical History:  Diagnosis Date   Acute blood loss anemia 03/01/2022   Anemia    Anxiety    COVID-19 affecting pregnancy in third trimester 02/04/2022   +01/28/2022   Endometritis 03/05/2022   Hypertension    Postpartum septic endometritis 03/09/2022   UTI (urinary tract infection) 05/26/2023    Medications:  Medications Prior to Admission  Medication Sig Dispense Refill Last Dose/Taking   acetaminophen  (TYLENOL ) 500 MG tablet Take 2 tablets (1,000 mg total) by mouth every 8 (eight) hours as needed (pain). 60 tablet 0 Past Week   amLODipine  (NORVASC ) 5 MG tablet Take 1 tablet (5 mg total) by mouth daily. 30 tablet 0 02/10/2024   ibuprofen  (ADVIL ) 200 MG tablet Take 200 mg by mouth every 6 (six) hours as needed.   Past Week   oxyCODONE  (OXY IR/ROXICODONE ) 5 MG immediate release tablet Take 1 tablet (5 mg total) by mouth every 6 (six) hours as needed for up to 4 doses for moderate pain (pain score 4-6) or severe pain (pain score 7-10). 4 tablet 0 Taking As Needed   pantoprazole  (PROTONIX ) 40 MG tablet Take 1  tablet (40 mg total) by mouth daily. 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 No PTA anticoagulation. Pt did receive dose of enoxaparin  40 mg on 5/17 at 0906.   Today, 02/15/24 HL 0.38 - therapeutic on 1000 units/hr Hgb 12.6--stable Plts 91--low, stable No s/sx of bleeding reported  Goal of Therapy:  Heparin  level 0.3-0.7 units/ml Monitor platelets by anticoagulation protocol: Yes   Plan:  Continue heparin  drip at 1000 units/hr Daily heparin  level, CBC  Monitor for signs and symptoms of bleeding    Roselee Cong, PharmD Clinical Pharmacist  5/19/20257:20 AM

## 2024-02-15 NOTE — Progress Notes (Signed)
 Progress Note  Patient Name: Melissa Guzman Date of Encounter: 02/15/2024  Primary Cardiologist: None   Subjective   BP 109/83.  Creatinine 0.51.  Denies any chest pain or dyspnea currently.  Reports intermittent abdominal pain and nausea  Inpatient Medications    Scheduled Meds:  feeding supplement  237 mL Oral BID BM   metoprolol  tartrate  12.5 mg Oral BID   pantoprazole   40 mg Oral Daily   polyethylene glycol  17 g Oral BID   sacubitril -valsartan   1 tablet Oral BID   Continuous Infusions:  heparin  1,000 Units/hr (02/14/24 1252)   PRN Meds: acetaminophen  **OR** acetaminophen , alum & mag hydroxide-simeth, HYDROmorphone  (DILAUDID ) injection, nitroGLYCERIN , prochlorperazine , senna-docusate   Vital Signs    Vitals:   02/14/24 0809 02/14/24 1549 02/14/24 2107 02/15/24 0348  BP:  (!) 130/100 (!) 133/102 109/83  Pulse: (!) 105 90 95 86  Resp: 16  16 20   Temp:  98.1 F (36.7 C) 97.9 F (36.6 C) 98.1 F (36.7 C)  TempSrc:  Oral Oral Oral  SpO2:  99% 100% 95%  Weight:      Height:        Intake/Output Summary (Last 24 hours) at 02/15/2024 0747 Last data filed at 02/15/2024 0400 Gross per 24 hour  Intake 90 ml  Output --  Net 90 ml   Filed Weights   02/11/24 1057  Weight: 59 kg    Telemetry    Sinus rhythm- Personally Reviewed  ECG    No new - Personally Reviewed  Physical Exam   GEN: No acute distress.   Neck: No JVD Cardiac: Regular rate and rhythm, no murmurs, rubs, or gallops.  Respiratory: Clear to auscultation bilaterally. GI: Soft, nontender, non-distended  MS: No edema; No deformity. Neuro:  Nonfocal  Psych: Normal affect   Labs    Chemistry Recent Labs  Lab 02/13/24 0122 02/13/24 0942 02/14/24 0345 02/15/24 0327  NA 134* 132* 133* 134*  K 3.3* 3.0* 3.6 3.5  CL 97* 95* 97* 94*  CO2 25 23 23 26   GLUCOSE 113* 117* 139* 105*  BUN <5* <5* <5* 5*  CREATININE 0.66 0.54 0.56 0.51  CALCIUM 8.9 8.8* 9.1 9.0  PROT 6.9  --  7.7  7.1  ALBUMIN 3.5  --  3.9 3.6  AST 68*  --  54* 44*  ALT 34  --  34 28  ALKPHOS 82  --  87 70  BILITOT 1.5*  --  1.4* 1.2  GFRNONAA >60 >60 >60 >60  ANIONGAP 12 14 13 14      Hematology Recent Labs  Lab 02/13/24 0942 02/14/24 0345 02/15/24 0327  WBC 4.7 6.3 6.1  RBC 3.45* 3.98 3.73*  HGB 11.6* 13.3 12.6  HCT 35.7* 40.9 39.8  MCV 103.5* 102.8* 106.7*  MCH 33.6 33.4 33.8  MCHC 32.5 32.5 31.7  RDW 13.4 13.1 13.0  PLT 91* 97* 91*    Cardiac EnzymesNo results for input(s): "TROPONINI" in the last 168 hours. No results for input(s): "TROPIPOC" in the last 168 hours.   BNPNo results for input(s): "BNP", "PROBNP" in the last 168 hours.   DDimer No results for input(s): "DDIMER" in the last 168 hours.   Summary of Pertinent studies    TTE: LVEF 20 to 25%.  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  Grade 2 diastolic dysfunction.  LV thrombus noted.  Right ventricular systolic function is moderately reduced.  Imaging: No new  Labs: TnI  peaked at 360  Patient Profile     28 y.o. 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  Assessment & Plan    Stress-induced cardiomyopathy An episode of retching and emesis preceded an episode of chest pain during which EKG showed nonspecific T wave changes, followed by mild troponin elevation, subsequent T wave inversion and QT prolongation; no ST elevation. Risk of coronary cath would likely outweigh the benefit due to her pretest risk of CAD, lack of typical ST changes of acute MI, extent of WMA out of proportion to her mild clinical syndrome and troponin elevation, and the classical findings of stress-induced cardiomyopathy (multiple coronary territories involved).  Recommend cardiac MRI to confirm Takotsubo, as well as evaluate for possible LV thrombus Titrating  GDMT: Continue metoprolol  12.5 mg twice daily, will consolidate to Toprol -XL prior to discharge Continue Entresto  24-26 mg twice daily Amlodipine  discontinued She appears euvolemic and well-compensated Between acute pancreatitis and acute cardiomyopathy, fluid status is a fine line. Strict I&Os. At this time,would aim to keep her volume status fairly even.  Possible LV thrombus on echo Continue heparin  drip for now Recommend cardiac MRI for further evaluation  Acute pancreatitis Nausea, appetite improving  Hypomagnesemia Aggressively treat to keep magnesium  greater than 2 in the setting of QT prolongation from stress-induced cardiomyopathy as well as antiemetics  Prolonged QT Often seen in the setting of stress-induced cardiomyopathy Will be exacerbated by antiemetics -- do not give QT prolonging medications Aggressively treat hypomagnesemia Check daily ECGs    For questions or updates, please contact [facility] Please consult [email or web address] for contact info under Cardiology/STEMI.      Signed, Wendie Hamburg, MD 02/15/2024, 7:47 AM

## 2024-02-15 NOTE — Progress Notes (Signed)
Patient brought to 4E from [facility]. VSS. Telemetry box applied, CCMD notified. Patient oriented to room and staff. Call bell in reach.  Kenard Gower, RN

## 2024-02-16 DIAGNOSIS — K851 Biliary acute pancreatitis without necrosis or infection: Secondary | ICD-10-CM | POA: Diagnosis not present

## 2024-02-16 DIAGNOSIS — I5041 Acute combined systolic (congestive) and diastolic (congestive) heart failure: Secondary | ICD-10-CM | POA: Diagnosis not present

## 2024-02-16 DIAGNOSIS — R300 Dysuria: Secondary | ICD-10-CM | POA: Insufficient documentation

## 2024-02-16 DIAGNOSIS — R9431 Abnormal electrocardiogram [ECG] [EKG]: Secondary | ICD-10-CM | POA: Insufficient documentation

## 2024-02-16 DIAGNOSIS — I5181 Takotsubo syndrome: Secondary | ICD-10-CM | POA: Diagnosis not present

## 2024-02-16 LAB — HEPARIN LEVEL (UNFRACTIONATED): Heparin Unfractionated: 0.41 [IU]/mL (ref 0.30–0.70)

## 2024-02-16 LAB — BASIC METABOLIC PANEL WITH GFR
Anion gap: 7 (ref 5–15)
BUN: 6 mg/dL (ref 6–20)
CO2: 30 mmol/L (ref 22–32)
Calcium: 8.6 mg/dL — ABNORMAL LOW (ref 8.9–10.3)
Chloride: 98 mmol/L (ref 98–111)
Creatinine, Ser: 0.62 mg/dL (ref 0.44–1.00)
GFR, Estimated: >60 mL/min (ref >60)
Glucose, Bld: 129 mg/dL — ABNORMAL HIGH (ref 70–99)
Potassium: 3.5 mmol/L (ref 3.5–5.1)
Sodium: 135 mmol/L (ref 135–145)

## 2024-02-16 LAB — URINALYSIS, COMPLETE (UACMP) WITH MICROSCOPIC
Bilirubin Urine: NEGATIVE
Glucose, UA: NEGATIVE mg/dL
Hgb urine dipstick: NEGATIVE
Ketones, ur: NEGATIVE mg/dL
Nitrite: NEGATIVE
Protein, ur: NEGATIVE mg/dL
Specific Gravity, Urine: 1.013 (ref 1.005–1.030)
pH: 7 (ref 5.0–8.0)

## 2024-02-16 LAB — CBC
HCT: 37.8 % (ref 36.0–46.0)
Hemoglobin: 12.3 g/dL (ref 12.0–15.0)
MCH: 33 pg (ref 26.0–34.0)
MCHC: 32.5 g/dL (ref 30.0–36.0)
MCV: 101.3 fL — ABNORMAL HIGH (ref 80.0–100.0)
Platelets: 100 10*3/uL — ABNORMAL LOW (ref 150–400)
RBC: 3.73 MIL/uL — ABNORMAL LOW (ref 3.87–5.11)
RDW: 12.9 % (ref 11.5–15.5)
WBC: 5 10*3/uL (ref 4.0–10.5)
nRBC: 0 % (ref 0.0–0.2)

## 2024-02-16 LAB — MAGNESIUM: Magnesium: 1.8 mg/dL (ref 1.7–2.4)

## 2024-02-16 MED ORDER — ENOXAPARIN SODIUM 40 MG/0.4ML IJ SOSY
40.0000 mg | PREFILLED_SYRINGE | INTRAMUSCULAR | Status: DC
  Administered 2024-02-16 – 2024-02-17 (×2): 40 mg via SUBCUTANEOUS
  Filled 2024-02-16 (×2): qty 0.4

## 2024-02-16 MED ORDER — CEPHALEXIN 250 MG PO CAPS
250.0000 mg | ORAL_CAPSULE | Freq: Four times a day (QID) | ORAL | Status: DC
  Administered 2024-02-16 – 2024-02-18 (×8): 250 mg via ORAL
  Filled 2024-02-16 (×11): qty 1

## 2024-02-16 MED ORDER — MAGNESIUM SULFATE 2 GM/50ML IV SOLN
2.0000 g | Freq: Once | INTRAVENOUS | Status: AC
  Administered 2024-02-16: 2 g via INTRAVENOUS
  Filled 2024-02-16: qty 50

## 2024-02-16 MED ORDER — POTASSIUM CHLORIDE CRYS ER 20 MEQ PO TBCR
40.0000 meq | EXTENDED_RELEASE_TABLET | Freq: Once | ORAL | Status: AC
  Administered 2024-02-16: 40 meq via ORAL
  Filled 2024-02-16: qty 2

## 2024-02-16 MED ORDER — TRIMETHOBENZAMIDE HCL 100 MG/ML IM SOLN: 200.0000 mg | Freq: Three times a day (TID) | INTRAMUSCULAR | Status: DC | PRN

## 2024-02-16 NOTE — Assessment & Plan Note (Addendum)
 Developed troponin elevation after hospitalization, EP and echo showed LVEF 20-25% with LV thrombus.  Euvolemic on exam today. 24 hour UOP likely inaccurate, Buyer, retail. Net positive 6.7 L this admission, question accuracy. Weights have not been followed up until today. K and Mag replenished this AM.  Creatinine stable. - Change level of care to Cardiac Tele - Strict I&Os, daily weights - Caution with fluids - Continuous cardiac monitoring - Ensure Enlive BID, daily prenatal vitamin - Daily BMP and Mag, goal K>4, Mag>2 - Follow Cardiology and EP recs: - GDMT: Continue metoprolol  12.5 tartrate mg BID (consolidate to succinate on DC) and Entresto  24-26 mg BID - Cardiac MRI 5/19 confirmed Takotsubo cardiomyopathy

## 2024-02-16 NOTE — Assessment & Plan Note (Signed)
 HTN: As per acute HF above GERD: Continue pantoprazole  40 mg daily

## 2024-02-16 NOTE — Assessment & Plan Note (Signed)
 QTcB 510 on 5/19. - Minimize QT prolonging meds - Switch compazine  to Tigan 200 mg IM Q8h PRN

## 2024-02-16 NOTE — Assessment & Plan Note (Signed)
 Reported 5/20. - Obtain UA

## 2024-02-16 NOTE — Progress Notes (Addendum)
 Daily Progress Note Intern Pager: [phone]  Patient name: Melissa Guzman Medical record number: [id number] Date of birth: 09-16-1996 Age: 28 y.o. Gender: female  Primary Care Provider: [facility] Consultants: Cardiology, EP, General Surgery Code Status: FULL  Pt Overview and Major Events to Date:  5/15 - Admitted by [facility], suspected gallstone pancreatitis 5/16 - Troponin elevation and Cards/EP consulted 5/17 - Echo showing LVEF 20-25% and ?LV thrombus, therapeutic heparin  and GDMT started per Cards 5/20 - Transferred to [facility] care; Cardiac MRI showing no LV thrombus and Takotsubo cardiomyopathy confirmed, DC therapeutic dose heparin    Assessment and Plan: Melissa Guzman is a 28 y.o. female with a pertinent PMH of HTN, Hx C-section, and known cholelithiasis s/[ failed cholecystectomy Jan 2025 d/t adhesions who presented with abdominal pain and N/V and was admitted by [facility] for pancreatitis.  Was found to have stress-induced cardiomyopathy and had concern for LV thrombus, currently titrating GDMT per Cardiology and following recommendations for cardiomyopathy. Assessment & Plan Stress-induced cardiomyopathy  Acute systolic heart failure Developed troponin elevation after hospitalization, EP and echo showed LVEF 20-25% with LV thrombus.  Euvolemic on exam today. 24 hour UOP likely inaccurate, Buyer, retail. Net positive 6.7 L this admission, question accuracy. Weights have not been followed up until today. K and Mag replenished this AM.  Creatinine stable. - Change level of care to Cardiac Tele - Strict I&Os, daily weights - Caution with fluids - Continuous cardiac monitoring - Ensure Enlive BID, daily prenatal vitamin - Daily BMP and Mag, goal K>4, Mag>2 - Follow Cardiology and EP recs: - GDMT: Continue metoprolol  12.5 tartrate mg BID (consolidate to succinate on DC) and Entresto  24-26 mg BID - Cardiac MRI 5/19 confirmed  Takotsubo cardiomyopathy Pancreatitis, acute Presented with elevated lipase, some gallbladder sluge on RUQ US . - Pain regimen: Acetaminophen  650 mg Q6h PRN - Maalox Q4h PRN - General Surgery consulted, follow up recs: - Outpatient cholecystectomy initially planned June 6th - Unlikely surgical intervention in setting of cardiomyopathy Dysuria Reported 5/20. - Obtain UA LV (left ventricular) mural thrombus Concern on initial echo but cardiac MRI 5/19 did not show LV thrombus. - Discontinue therapeutic dose heparin  Prolonged QT interval QTcB 510 on 5/19. - Minimize QT prolonging meds - Switch compazine  to Tigan 200 mg IM Q8h PRN Chronic health problem HTN: As per acute HF above GERD: Continue pantoprazole  40 mg daily  FEN/GI: Transition to regular diet PPx: Lovenox , DC therapeutic dose heparin  Dispo: Pending PT recommendations  when clinically stable. Barriers include cardiomyopathy plan per Cardiology and consideration for cholecystectomy.  Subjective:  This AM, patient denies chest pain, dyspnea, palpitations.  States that she would like to take a shower.  Denies any abdominal pain.  Overall comfortable.  Expresses some anxiety about her medical condition.  Patient later reported dysuria to RN.  Objective: Temp:  [97.8 F (36.6 C)-98.5 F (36.9 C)] 98.5 F (36.9 C) (05/20 0743) Pulse Rate:  [80-102] 102 (05/20 0743) Resp:  [12-20] 16 (05/20 0743) BP: (92-113)/(60-85) 92/60 (05/20 0743) SpO2:  [93 %-100 %] 96 % (05/20 0743)  Physical Exam: General: Age-appropriate, resting comfortably in bed, NAD, alert and at baseline. Cardiovascular: No JVD.  Tachycardic rate and regular rhythm. Normal S1/S2. No murmurs, rubs, or gallops appreciated. 2+ radial pulses. Pulmonary: Clear bilaterally to auscultation; no wheezes, crackles, or rhonchi. Normal WOB on room air, no accessory muscle usage. Abdominal: Normoactive bowel sounds, nondistended.  Mild diffuse tenderness to deep  palpation.  No rebound or guarding. Skin: Warm and dry. Extremities: No peripheral edema bilaterally. Capillary refill <2 seconds.  Laboratory: Most recent CBC Lab Results  Component Value Date   WBC 5.0 02/16/2024   HGB 12.3 02/16/2024   HCT 37.8 02/16/2024   MCV 101.3 (H) 02/16/2024   PLT 100 (L) 02/16/2024   Most recent BMP    Latest Ref Rng & Units 02/16/2024    4:37 AM  BMP  Glucose 70 - 99 mg/dL 161   BUN 6 - 20 mg/dL 6   Creatinine 0.96 - 0.45 mg/dL 4.09   Sodium 811 - 914 mmol/L 135   Potassium 3.5 - 5.1 mmol/L 3.5   Chloride 98 - 111 mmol/L 98   CO2 22 - 32 mmol/L 30   Calcium 8.9 - 10.3 mg/dL 8.6     Other pertinent labs: -None  New Imaging/Diagnostic Tests: -Cardiac MRI: Takotsubo cardiomyopathy, no LV thrombus visualized  Homer Lust, MD 02/16/2024, 8:21 AM  PGY-1, [facility] FPTS Intern pager: [phone], text pages welcome Secure chat group [facility] Teaching Service

## 2024-02-16 NOTE — Progress Notes (Signed)
 Progress Note  Patient Name: Melissa Guzman Date of Encounter: 02/16/2024  Primary Cardiologist: None   Subjective   BP 108/72.  Creatinine 0.62.  Denies any chest pain or dyspnea  Inpatient Medications    Scheduled Meds:  feeding supplement  237 mL Oral BID BM   metoprolol  tartrate  12.5 mg Oral BID   pantoprazole   40 mg Oral Daily   polyethylene glycol  17 g Oral BID   prenatal multivitamin  1 tablet Oral Q1200   sacubitril -valsartan   1 tablet Oral BID   Continuous Infusions:  heparin  1,000 Units/hr (02/14/24 1252)   magnesium  sulfate bolus IVPB     PRN Meds: acetaminophen  **OR** acetaminophen , alum & mag hydroxide-simeth, HYDROmorphone  (DILAUDID ) injection, nitroGLYCERIN , senna-docusate, trimethobenzamide   Vital Signs    Vitals:   02/15/24 2333 02/16/24 0330 02/16/24 0743 02/16/24 1011  BP: 106/77 98/68 92/60  108/72  Pulse: 80 80 (!) 102 (!) 106  Resp: 20 20 16    Temp: 98.5 F (36.9 C) 98.5 F (36.9 C) 98.5 F (36.9 C)   TempSrc: Oral Oral Oral   SpO2: 100% 100% 96%   Weight:      Height:        Intake/Output Summary (Last 24 hours) at 02/16/2024 1136 Last data filed at 02/15/2024 1834 Gross per 24 hour  Intake 240 ml  Output --  Net 240 ml   Filed Weights   02/11/24 1057  Weight: 59 kg    Telemetry    Sinus rhythm- Personally Reviewed  ECG    QTC 510 on EKG 5/19 - Personally Reviewed  Physical Exam   GEN: No acute distress.   Neck: No JVD Cardiac: Regular rate and rhythm, no murmurs, rubs, or gallops.  Respiratory: Clear to auscultation bilaterally. GI: Soft, nontender, non-distended  MS: No edema; No deformity. Neuro:  Nonfocal  Psych: Normal affect   Labs    Chemistry Recent Labs  Lab 02/13/24 0122 02/13/24 1610 02/14/24 0345 02/15/24 0327 02/16/24 0437  NA 134*   < > 133* 134* 135  K 3.3*   < > 3.6 3.5 3.5  CL 97*   < > 97* 94* 98  CO2 25   < > 23 26 30   GLUCOSE 113*   < > 139* 105* 129*  BUN <5*   < > <5*  5* 6  CREATININE 0.66   < > 0.56 0.51 0.62  CALCIUM 8.9   < > 9.1 9.0 8.6*  PROT 6.9  --  7.7 7.1  --   ALBUMIN 3.5  --  3.9 3.6  --   AST 68*  --  54* 44*  --   ALT 34  --  34 28  --   ALKPHOS 82  --  87 70  --   BILITOT 1.5*  --  1.4* 1.2  --   GFRNONAA >60   < > >60 >60 >60  ANIONGAP 12   < > 13 14 7    < > = values in this interval not displayed.     Hematology Recent Labs  Lab 02/14/24 0345 02/15/24 0327 02/16/24 0437  WBC 6.3 6.1 5.0  RBC 3.98 3.73* 3.73*  HGB 13.3 12.6 12.3  HCT 40.9 39.8 37.8  MCV 102.8* 106.7* 101.3*  MCH 33.4 33.8 33.0  MCHC 32.5 31.7 32.5  RDW 13.1 13.0 12.9  PLT 97* 91* 100*    Cardiac EnzymesNo results for input(s): "TROPONINI" in the last 168 hours. No results for input(s): "TROPIPOC"  in the last 168 hours.   BNPNo results for input(s): "BNP", "PROBNP" in the last 168 hours.   DDimer No results for input(s): "DDIMER" in the last 168 hours.   Summary of Pertinent studies    TTE: LVEF 20 to 25%.  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  Grade 2 diastolic dysfunction.  LV thrombus noted.  Right ventricular systolic function is moderately reduced.  Imaging: No new  Labs: TnI peaked at 360  Patient Profile     28 y.o. 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  Assessment & Plan    Stress-induced cardiomyopathy An episode of retching and emesis preceded an episode of chest pain during which EKG showed nonspecific T wave changes, followed by mild troponin elevation, subsequent T wave inversion and QT prolongation; no ST elevation. Risk of coronary cath would likely outweigh the benefit due to her pretest risk of CAD, lack of typical ST changes of acute MI, extent of WMA out of proportion to her mild clinical syndrome and troponin elevation, and the classical findings  of stress-induced cardiomyopathy (multiple coronary territories involved). Cardiac MRI on 5/19 with findings consistent with stress induced cardiomyopathy Titrating GDMT: Continue metoprolol  12.5 mg twice daily, Guzman consolidate to Toprol -XL prior to discharge Continue Entresto  24-26 mg twice daily Amlodipine  discontinued She appears euvolemic and well-compensated Between acute pancreatitis and acute cardiomyopathy, fluid status is a fine line. Strict I&Os. At this time,would aim to keep her volume status fairly even.  Possible LV thrombus on echo Cardiac MRI with no LV thrombus, can discontinue IV heparin   Acute pancreatitis Nausea, appetite improving  Hypomagnesemia Aggressively treat to keep magnesium  greater than 2 in the setting of QT prolongation from stress-induced cardiomyopathy as well as antiemetics  Prolonged QT Often seen in the setting of stress-induced cardiomyopathy Guzman be exacerbated by antiemetics -- do not give QT prolonging medications Aggressively treat hypomagnesemia Check daily ECGs    For questions or updates, please contact [facility] Please consult [email or web address] for contact info under Cardiology/STEMI.      Signed, Wendie Hamburg, MD 02/16/2024, 11:36 AM

## 2024-02-16 NOTE — Assessment & Plan Note (Addendum)
 Concern on initial echo but cardiac MRI 5/19 did not show LV thrombus. - Discontinue therapeutic dose heparin 

## 2024-02-16 NOTE — Assessment & Plan Note (Addendum)
 Presented with elevated lipase, some gallbladder sluge on RUQ US . - Pain regimen: Acetaminophen  650 mg Q6h PRN - Maalox Q4h PRN - General Surgery consulted, follow up recs: - Outpatient cholecystectomy initially planned June 6th - Unlikely surgical intervention in setting of cardiomyopathy

## 2024-02-17 DIAGNOSIS — I5041 Acute combined systolic (congestive) and diastolic (congestive) heart failure: Secondary | ICD-10-CM | POA: Diagnosis not present

## 2024-02-17 DIAGNOSIS — R9431 Abnormal electrocardiogram [ECG] [EKG]: Secondary | ICD-10-CM | POA: Diagnosis not present

## 2024-02-17 DIAGNOSIS — I5181 Takotsubo syndrome: Secondary | ICD-10-CM | POA: Diagnosis not present

## 2024-02-17 DIAGNOSIS — K851 Biliary acute pancreatitis without necrosis or infection: Secondary | ICD-10-CM | POA: Diagnosis not present

## 2024-02-17 LAB — CBC
HCT: 33.3 % — ABNORMAL LOW (ref 36.0–46.0)
Hemoglobin: 11.1 g/dL — ABNORMAL LOW (ref 12.0–15.0)
MCH: 34 pg (ref 26.0–34.0)
MCHC: 33.3 g/dL (ref 30.0–36.0)
MCV: 102.1 fL — ABNORMAL HIGH (ref 80.0–100.0)
Platelets: 126 10*3/uL — ABNORMAL LOW (ref 150–400)
RBC: 3.26 MIL/uL — ABNORMAL LOW (ref 3.87–5.11)
RDW: 13 % (ref 11.5–15.5)
WBC: 4.6 10*3/uL (ref 4.0–10.5)
nRBC: 0 % (ref 0.0–0.2)

## 2024-02-17 LAB — BASIC METABOLIC PANEL WITH GFR
Anion gap: 6 (ref 5–15)
BUN: 7 mg/dL (ref 6–20)
CO2: 29 mmol/L (ref 22–32)
Calcium: 8.9 mg/dL (ref 8.9–10.3)
Chloride: 101 mmol/L (ref 98–111)
Creatinine, Ser: 0.62 mg/dL (ref 0.44–1.00)
GFR, Estimated: >60 mL/min (ref >60)
Glucose, Bld: 100 mg/dL — ABNORMAL HIGH (ref 70–99)
Potassium: 4.6 mmol/L (ref 3.5–5.1)
Sodium: 136 mmol/L (ref 135–145)

## 2024-02-17 LAB — MAGNESIUM: Magnesium: 2 mg/dL (ref 1.7–2.4)

## 2024-02-17 MED ORDER — LOSARTAN POTASSIUM 25 MG PO TABS
25.0000 mg | ORAL_TABLET | Freq: Every day | ORAL | Status: DC
  Administered 2024-02-17 – 2024-02-18 (×2): 25 mg via ORAL
  Filled 2024-02-17 (×2): qty 1

## 2024-02-17 NOTE — Progress Notes (Signed)
 Progress Note  Patient Name: Melissa Guzman Date of Encounter: 02/17/2024  Primary Cardiologist: None   Subjective   BP 89/58  Creatinine stable at 0.62.  Potassium 4.6, magnesium  2.0.  Denies any chest pain or dyspnea  Inpatient Medications    Scheduled Meds:  cephALEXin   250 mg Oral Q6H   enoxaparin  (LOVENOX ) injection  40 mg Subcutaneous Q24H   feeding supplement  237 mL Oral BID BM   metoprolol  tartrate  12.5 mg Oral BID   pantoprazole   40 mg Oral Daily   polyethylene glycol  17 g Oral BID   prenatal multivitamin  1 tablet Oral Q1200   sacubitril -valsartan   1 tablet Oral BID   Continuous Infusions:   PRN Meds: acetaminophen  **OR** acetaminophen , HYDROmorphone  (DILAUDID ) injection, nitroGLYCERIN , senna-docusate, trimethobenzamide   Vital Signs    Vitals:   02/16/24 1944 02/16/24 2332 02/17/24 0400 02/17/24 0727  BP: 107/72 99/66 98/70  (!) 89/58  Pulse: 74 72 72 77  Resp: 20 20 20 16   Temp: 98.4 F (36.9 C) 98.7 F (37.1 C) 98.5 F (36.9 C) 98.5 F (36.9 C)  TempSrc: Oral Oral Oral Oral  SpO2: 100% 100% 100% 95%  Weight:  55.7 kg    Height:        Intake/Output Summary (Last 24 hours) at 02/17/2024 0942 Last data filed at 02/16/2024 2033 Gross per 24 hour  Intake 50 ml  Output --  Net 50 ml   Filed Weights   02/11/24 1057 02/16/24 2332  Weight: 59 kg 55.7 kg    Telemetry    Sinus rhythm- Personally Reviewed  ECG    QTC 443 on EKG today- Personally Reviewed  Physical Exam   GEN: No acute distress.   Neck: No JVD Cardiac: Regular rate and rhythm, no murmurs, rubs, or gallops.  Respiratory: Clear to auscultation bilaterally. GI: Soft, nontender, non-distended  MS: No edema; No deformity. Neuro:  Nonfocal  Psych: Normal affect   Labs    Chemistry Recent Labs  Lab 02/13/24 0122 02/13/24 9604 02/14/24 0345 02/15/24 0327 02/16/24 0437 02/17/24 0346  NA 134*   < > 133* 134* 135 136  K 3.3*   < > 3.6 3.5 3.5 4.6  CL 97*    < > 97* 94* 98 101  CO2 25   < > 23 26 30 29   GLUCOSE 113*   < > 139* 105* 129* 100*  BUN <5*   < > <5* 5* 6 7  CREATININE 0.66   < > 0.56 0.51 0.62 0.62  CALCIUM 8.9   < > 9.1 9.0 8.6* 8.9  PROT 6.9  --  7.7 7.1  --   --   ALBUMIN 3.5  --  3.9 3.6  --   --   AST 68*  --  54* 44*  --   --   ALT 34  --  34 28  --   --   ALKPHOS 82  --  87 70  --   --   BILITOT 1.5*  --  1.4* 1.2  --   --   GFRNONAA >60   < > >60 >60 >60 >60  ANIONGAP 12   < > 13 14 7 6    < > = values in this interval not displayed.     Hematology Recent Labs  Lab 02/15/24 0327 02/16/24 0437 02/17/24 0346  WBC 6.1 5.0 4.6  RBC 3.73* 3.73* 3.26*  HGB 12.6 12.3 11.1*  HCT 39.8 37.8 33.3*  MCV 106.7* 101.3* 102.1*  MCH 33.8 33.0 34.0  MCHC 31.7 32.5 33.3  RDW 13.0 12.9 13.0  PLT 91* 100* 126*    Cardiac EnzymesNo results for input(s): "TROPONINI" in the last 168 hours. No results for input(s): "TROPIPOC" in the last 168 hours.   BNPNo results for input(s): "BNP", "PROBNP" in the last 168 hours.   DDimer No results for input(s): "DDIMER" in the last 168 hours.   Summary of Pertinent studies    TTE: LVEF 20 to 25%.  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  Grade 2 diastolic dysfunction.  LV thrombus noted.  Right ventricular systolic function is moderately reduced.  Imaging: No new  Labs: TnI peaked at 360  Patient Profile     28 y.o. 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  Assessment & Plan    Stress-induced cardiomyopathy An episode of retching and emesis preceded an episode of chest pain during which EKG showed nonspecific T wave changes, followed by mild troponin elevation, subsequent T wave inversion and QT prolongation; no ST elevation. Risk of coronary cath would likely outweigh the benefit due to her pretest risk of  CAD, lack of typical ST changes of acute MI, extent of WMA out of proportion to her mild clinical syndrome and troponin elevation, and the classical findings of stress-induced cardiomyopathy (multiple coronary territories involved). Cardiac MRI on 5/19 with findings consistent with stress induced cardiomyopathy Titrating GDMT: Continue metoprolol  12.5 mg twice daily, will consolidate to Toprol -XL prior to discharge Started on Entresto  24-26 mg twice daily but having soft BP, will switch to losartan 25 mg daily Amlodipine  discontinued She appears euvolemic and well-compensated Between acute pancreatitis and acute cardiomyopathy, fluid status is a fine line. Strict I&Os. At this time,would aim to keep her volume status fairly even.  Possible LV thrombus on echo Cardiac MRI with no LV thrombus, discontinued IV heparin   Acute pancreatitis Nausea, appetite improving  Hypomagnesemia Aggressively treat to keep magnesium  greater than 2 in the setting of QT prolongation from stress-induced cardiomyopathy as well as antiemetics  Prolonged QT Often seen in the setting of stress-induced cardiomyopathy Will be exacerbated by antiemetics -- do not give QT prolonging medications Aggressively treat hypomagnesemia Improved, EKG today shows QTc 443    For questions or updates, please contact [facility] Please consult [email or web address] for contact info under Cardiology/STEMI.      Signed, Wendie Hamburg, MD 02/17/2024, 9:42 AM

## 2024-02-17 NOTE — Assessment & Plan Note (Addendum)
 HTN: As per acute HF above GERD: Continue pantoprazole  40 mg daily

## 2024-02-17 NOTE — Assessment & Plan Note (Addendum)
 Improved, QTcB 443 on 5/20. - Minimize QT prolonging meds

## 2024-02-17 NOTE — Assessment & Plan Note (Addendum)
 Developed troponin elevation after hospitalization, EP and echo showed LVEF 20-25% with LV thrombus.  Euvolemic on exam today. 24 hour UOP likely inaccurate, Buyer, retail. Net positive 6.7 L this admission, question accuracy. Weights have not been followed up until today. K and Mag replenished this AM.  Creatinine stable. 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

## 2024-02-17 NOTE — Assessment & Plan Note (Addendum)
 Reported 5/20. UA most consistent with UTI. Started treatment.  - Keflex  250 mg Q6H for 5 days (5/20-5/24)

## 2024-02-17 NOTE — Plan of Care (Addendum)
 Spoke with Marlin Simmonds with General Surgery to clarify lap chole plans moving forward.  Dr. Davonna Estes of General Surgery is following chart closely and watching Cardiology recs to determine whether to keep June 6th surgery or reschedule.  This determination will depend upon EF and cardiovascular recovery, unclear at this time and may need repeat cardiac clearance or echo.  Dr. Davonna Estes will follow chart.

## 2024-02-17 NOTE — Assessment & Plan Note (Addendum)
 Presented with elevated lipase, some gallbladder sluge on RUQ US . - Pain regimen: Acetaminophen  650 mg Q6h PRN - Maalox Q4h PRN - General Surgery consulted, follow up recs per note or discharge instructions: - Outpatient cholecystectomy initially planned June 6th - Unlikely surgical intervention in setting of cardiomyopathy

## 2024-02-17 NOTE — Progress Notes (Addendum)
 Daily Progress Note Intern Pager: [phone]  Patient name: Melissa Guzman Medical record number: [id number] Date of birth: Jun 04, 1996 Age: 28 y.o. Gender: female  Primary Care Provider: [facility] Medical Consultants: Cardiology, EP, General Surgery Code Status: FULL  Pt Overview and Major Events to Date:  5/15 - Admitted by [facility], suspected gallstone pancreatitis 5/16 - Troponin elevation and Cards/EP consulted 5/17 - Echo showing LVEF 20-25% and ?LV thrombus, therapeutic heparin  and GDMT started per Cards 5/20 - Transferred to [facility] care; Cardiac MRI showing no LV thrombus and Takotsubo cardiomyopathy confirmed, DC therapeutic dose heparin  5/21: General Surgery consulted   Assessment and Plan: Melissa Guzman is a 28 y.o. female with a pertinent PMH of HTN, Hx C-section, and known cholelithiasis s/p failed cholecystectomy Jan 2025 d/t adhesions who presented with abdominal pain and N/V and was admitted by [facility] for pancreatitis.  Was found to have stress-induced cardiomyopathy and had concern for LV thrombus, currently titrating GDMT per Cardiology and following recommendations for cardiomyopathy. Given that patient is on multiple teratogenic medications will discuss birth control prior to discharge.  Assessment & Plan Stress-induced cardiomyopathy  Acute systolic heart failure Developed troponin elevation after hospitalization, EP and echo showed LVEF 20-25% with LV thrombus.  Euvolemic on exam today. 24 hour UOP likely inaccurate, Buyer, retail. Net positive 6.7 L this admission, question accuracy. Weights have not been followed up until today. K and Mag replenished this AM.  Creatinine stable. 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 - Pain regimen: Acetaminophen  650 mg Q6h PRN - Maalox Q4h PRN - General Surgery consulted, follow up recs per note or discharge instructions: - Outpatient cholecystectomy initially planned June 6th - Unlikely surgical intervention in setting of cardiomyopathy Dysuria Reported 5/20. UA most consistent with UTI. Started treatment.  - Keflex  250 mg Q6H for 5 days (5/20-5/24) Prolonged QT interval Improved, QTcB 443 on 5/20. - Minimize QT prolonging meds Chronic health problem HTN: As per acute HF above GERD: Continue pantoprazole  40 mg daily  FEN/GI: Transition to regular diet PPx: Lovenox , DC therapeutic dose heparin  Dispo: Pending PT recommendations  when clinically stable. Barriers include cardiomyopathy plan per Cardiology and consideration for cholecystectomy.  Subjective:  Patient seen sleeping comfortably in bed. Awoke to voice. States that she has mild generalized abdominal pain that she reports has been present the entire hospitalization. Had a singular small bowel movement but thinks that she will likely have a larger bowel movement soon. She is overall comfortable. Denies dysuria, instead notes that her urine smelled odd and its appearance was different from usual. Patient did not elaborate further.   Objective: Temp:  [98.2 F (36.8 C)-98.7 F (37.1 C)] 98.5 F (36.9 C) (05/21 0727) Pulse Rate:  [72-92] 77 (05/21 0727) Resp:  [10-20] 16 (05/21 0727) BP: (89-107)/(58-73) 89/58 (05/21 0727) SpO2:  [95 %-100 %] 95 % (05/21 0727) Weight:  [55.7 kg] 55.7 kg (05/20 2332)  Physical Exam: General: Age-appropriate, resting comfortably in bed, NAD, alert and at baseline. Cardiovascular: No JVD.  Regular  rate and rhythm. Normal S1/S2. No murmurs, rubs, or gallops appreciated. Pulmonary: Clear bilaterally to auscultation anteriorly. Normal WOB on room air, no accessory muscle usage. Abdominal: Normoactive bowel sounds, nondistended.  Mild diffuse tenderness to deep palpation. No rebound or guarding. Skin: Warm and dry. Extremities: No peripheral edema bilaterally.   Laboratory: Most recent CBC Lab Results  Component Value Date   WBC 4.6 02/17/2024   HGB 11.1 (L) 02/17/2024   HCT 33.3 (L) 02/17/2024   MCV 102.1 (H) 02/17/2024   PLT 126 (L) 02/17/2024   Most recent BMP    Latest Ref Rng & Units 02/17/2024    3:46 AM  BMP  Glucose 70 - 99 mg/dL 409   BUN 6 - 20 mg/dL 7   Creatinine 8.11 - 9.14 mg/dL 7.82   Sodium 956 - 213 mmol/L 136   Potassium 3.5 - 5.1 mmol/L 4.6   Chloride 98 - 111 mmol/L 101   CO2 22 - 32 mmol/L 29   Calcium 8.9 - 10.3 mg/dL 8.9     Other pertinent labs: -None  New Imaging/Diagnostic Tests: -None  Mia Parry Bolster, Medical Student 02/17/2024, 10:51 AM   Resident Addendum I have separately seen and examined the patient.  I have discussed the findings and exam with the medical student and agree with the above note.  I helped develop the management plan that is described in the student's note and I agree with the content.  I have outlined my exam, assessment, and plan below:  Awaiting general surgery recommendations for possible cholecystectomy here this admission.   Clem Currier, DO [facility], PGY-2 02/17/24 11:53 AM

## 2024-02-17 NOTE — Progress Notes (Signed)
 Spoke with patient about her concerns about her upcoming gallbladder surgery. Patient needs to make child care arrangements whether she has the surgery while she is still in the hospital or to keep her June 6th surgery.   Patient is a single mom and needs to make arrangements for childcare.  Please give this patient the necessary information so that she can proceed with her surgery.  Thanks Nursing

## 2024-02-18 ENCOUNTER — Other Ambulatory Visit: Payer: Self-pay

## 2024-02-18 ENCOUNTER — Telehealth: Payer: Self-pay | Admitting: Cardiology

## 2024-02-18 ENCOUNTER — Other Ambulatory Visit (HOSPITAL_COMMUNITY): Payer: Self-pay

## 2024-02-18 DIAGNOSIS — R9431 Abnormal electrocardiogram [ECG] [EKG]: Secondary | ICD-10-CM | POA: Diagnosis not present

## 2024-02-18 DIAGNOSIS — I5181 Takotsubo syndrome: Secondary | ICD-10-CM | POA: Diagnosis not present

## 2024-02-18 DIAGNOSIS — I5041 Acute combined systolic (congestive) and diastolic (congestive) heart failure: Secondary | ICD-10-CM | POA: Diagnosis not present

## 2024-02-18 LAB — CBC
HCT: 30.9 % — ABNORMAL LOW (ref 36.0–46.0)
Hemoglobin: 10.1 g/dL — ABNORMAL LOW (ref 12.0–15.0)
MCH: 33.9 pg (ref 26.0–34.0)
MCHC: 32.7 g/dL (ref 30.0–36.0)
MCV: 103.7 fL — ABNORMAL HIGH (ref 80.0–100.0)
Platelets: 160 10*3/uL (ref 150–400)
RBC: 2.98 MIL/uL — ABNORMAL LOW (ref 3.87–5.11)
RDW: 13.1 % (ref 11.5–15.5)
WBC: 4.4 10*3/uL (ref 4.0–10.5)
nRBC: 0 % (ref 0.0–0.2)

## 2024-02-18 LAB — BASIC METABOLIC PANEL WITH GFR
Anion gap: 9 (ref 5–15)
BUN: 7 mg/dL (ref 6–20)
CO2: 26 mmol/L (ref 22–32)
Calcium: 8.8 mg/dL — ABNORMAL LOW (ref 8.9–10.3)
Chloride: 99 mmol/L (ref 98–111)
Creatinine, Ser: 0.7 mg/dL (ref 0.44–1.00)
GFR, Estimated: >60 mL/min (ref >60)
Glucose, Bld: 152 mg/dL — ABNORMAL HIGH (ref 70–99)
Potassium: 4.1 mmol/L (ref 3.5–5.1)
Sodium: 134 mmol/L — ABNORMAL LOW (ref 135–145)

## 2024-02-18 LAB — MAGNESIUM: Magnesium: 1.7 mg/dL (ref 1.7–2.4)

## 2024-02-18 MED ORDER — METOPROLOL SUCCINATE ER 25 MG PO TB24
25.0000 mg | ORAL_TABLET | Freq: Every day | ORAL | 0 refills | Status: DC
  Filled 2024-02-18: qty 30, 30d supply, fill #0

## 2024-02-18 MED ORDER — NITROGLYCERIN 0.4 MG SL SUBL
0.4000 mg | SUBLINGUAL_TABLET | SUBLINGUAL | 0 refills | Status: DC | PRN
  Filled 2024-02-18: qty 25, 5d supply, fill #0

## 2024-02-18 MED ORDER — CEFUROXIME AXETIL 250 MG PO TABS
250.0000 mg | ORAL_TABLET | Freq: Two times a day (BID) | ORAL | 0 refills | Status: DC
  Filled 2024-02-18: qty 5, 3d supply, fill #0

## 2024-02-18 MED ORDER — LOSARTAN POTASSIUM 25 MG PO TABS
25.0000 mg | ORAL_TABLET | Freq: Every day | ORAL | 0 refills | Status: DC
  Filled 2024-02-18: qty 30, 30d supply, fill #0

## 2024-02-18 MED ORDER — MAGNESIUM OXIDE 400 MG PO TABS
400.0000 mg | ORAL_TABLET | Freq: Every day | ORAL | 0 refills | Status: DC
  Filled 2024-02-18: qty 30, 30d supply, fill #0

## 2024-02-18 MED ORDER — MAGNESIUM SULFATE 2 GM/50ML IV SOLN
2.0000 g | Freq: Once | INTRAVENOUS | Status: AC
  Administered 2024-02-18: 2 g via INTRAVENOUS
  Filled 2024-02-18: qty 50

## 2024-02-18 MED ORDER — METOPROLOL SUCCINATE ER 25 MG PO TB24: 25.0000 mg | ORAL_TABLET | Freq: Every day | ORAL | Status: DC

## 2024-02-18 NOTE — Progress Notes (Signed)
 Progress Note  Patient Name: Melissa Guzman Date of Encounter: 02/18/2024  Primary Cardiologist: None   Subjective   BP 105/67  Creatinine stable at 0.70.  Potassium 4.1, magnesium  1.7.  Denies any chest pain or dyspnea  Inpatient Medications    Scheduled Meds:  cephALEXin   250 mg Oral Q6H   enoxaparin  (LOVENOX ) injection  40 mg Subcutaneous Q24H   feeding supplement  237 mL Oral BID BM   losartan  25 mg Oral Daily   metoprolol  tartrate  12.5 mg Oral BID   pantoprazole   40 mg Oral Daily   polyethylene glycol  17 g Oral BID   prenatal multivitamin  1 tablet Oral Q1200   Continuous Infusions:   PRN Meds: acetaminophen  **OR** acetaminophen , HYDROmorphone  (DILAUDID ) injection, nitroGLYCERIN , senna-docusate, trimethobenzamide   Vital Signs    Vitals:   02/17/24 2219 02/17/24 2325 02/18/24 0300 02/18/24 0845  BP: (!) 104/59 (!) 91/56 (!) 102/58 105/67  Pulse: 73 92 85 89  Resp: 14 20 20 20   Temp:  98.4 F (36.9 C) 98.4 F (36.9 C) 98.3 F (36.8 C)  TempSrc:  Oral Oral Oral  SpO2:  100% 100% 100%  Weight:   54.9 kg   Height:       No intake or output data in the 24 hours ending 02/18/24 1009  Filed Weights   02/11/24 1057 02/16/24 2332 02/18/24 0300  Weight: 59 kg 55.7 kg 54.9 kg    Telemetry    Sinus rhythm- Personally Reviewed  ECG    QTC 443 on EKG today- Personally Reviewed  Physical Exam   GEN: No acute distress.   Neck: No JVD Cardiac: Regular rate and rhythm, no murmurs, rubs, or gallops.  Respiratory: Clear to auscultation bilaterally. GI: Soft, nontender, non-distended  MS: No edema; No deformity. Neuro:  Nonfocal  Psych: Normal affect   Labs    Chemistry Recent Labs  Lab 02/13/24 0122 02/13/24 4098 02/14/24 0345 02/15/24 0327 02/16/24 0437 02/17/24 0346 02/18/24 0311  NA 134*   < > 133* 134* 135 136 134*  K 3.3*   < > 3.6 3.5 3.5 4.6 4.1  CL 97*   < > 97* 94* 98 101 99  CO2 25   < > 23 26 30 29 26   GLUCOSE 113*   <  > 139* 105* 129* 100* 152*  BUN <5*   < > <5* 5* 6 7 7   CREATININE 0.66   < > 0.56 0.51 0.62 0.62 0.70  CALCIUM 8.9   < > 9.1 9.0 8.6* 8.9 8.8*  PROT 6.9  --  7.7 7.1  --   --   --   ALBUMIN 3.5  --  3.9 3.6  --   --   --   AST 68*  --  54* 44*  --   --   --   ALT 34  --  34 28  --   --   --   ALKPHOS 82  --  87 70  --   --   --   BILITOT 1.5*  --  1.4* 1.2  --   --   --   GFRNONAA >60   < > >60 >60 >60 >60 >60  ANIONGAP 12   < > 13 14 7 6 9    < > = values in this interval not displayed.     Hematology Recent Labs  Lab 02/16/24 0437 02/17/24 0346 02/18/24 0311  WBC 5.0 4.6 4.4  RBC  3.73* 3.26* 2.98*  HGB 12.3 11.1* 10.1*  HCT 37.8 33.3* 30.9*  MCV 101.3* 102.1* 103.7*  MCH 33.0 34.0 33.9  MCHC 32.5 33.3 32.7  RDW 12.9 13.0 13.1  PLT 100* 126* 160    Cardiac EnzymesNo results for input(s): "TROPONINI" in the last 168 hours. No results for input(s): "TROPIPOC" in the last 168 hours.   BNPNo results for input(s): "BNP", "PROBNP" in the last 168 hours.   DDimer No results for input(s): "DDIMER" in the last 168 hours.   Summary of Pertinent studies    TTE: LVEF 20 to 25%.  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  Grade 2 diastolic dysfunction.  LV thrombus noted.  Right ventricular systolic function is moderately reduced.  Imaging: No new  Labs: TnI peaked at 360  Patient Profile     28 y.o. 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  Assessment & Plan    Stress-induced cardiomyopathy An episode of retching and emesis preceded an episode of chest pain during which EKG showed nonspecific T wave changes, followed by mild troponin elevation, subsequent T wave inversion and QT prolongation; no ST elevation. Risk of coronary cath would likely outweigh the benefit due to her pretest risk of CAD, lack  of typical ST changes of acute MI, extent of WMA out of proportion to her mild clinical syndrome and troponin elevation, and the classical findings of stress-induced cardiomyopathy (multiple coronary territories involved). Cardiac MRI on 5/19 with findings consistent with stress induced cardiomyopathy Titrating GDMT: Continue metoprolol  12.5 mg twice daily, will consolidate to Toprol -XL prior to discharge Started on Entresto  24-26 mg twice daily but having soft BP, will switch to losartan 25 mg daily Amlodipine  discontinued She appears euvolemic and well-compensated In regards to preoperative evaluation prior to cholecystectomy, would want to see recovery in LVEF prior to surgery. Will plan repeat echo in 2 weeks prior to her scheduled surgery on 6/6, if EF recovered can proceed with surgery as planned.  Sometimes can take longer for Takotsubo to recover though; if EF is still down would favor delaying surgery until EF has normalized  Possible LV thrombus on echo Cardiac MRI with no LV thrombus, discontinued IV heparin   Acute pancreatitis Nausea, appetite improving  Hypomagnesemia Aggressively treat to keep magnesium  greater than 2 in the setting of QT prolongation from stress-induced cardiomyopathy as well as antiemetics  Prolonged QT Often seen in the setting of stress-induced cardiomyopathy Will be exacerbated by antiemetics -- do not give QT prolonging medications Aggressively treat hypomagnesemia Improved, EKG today shows QTc 443  [facility] will sign off.   Medication Recommendations: Losartan 25 mg daily, Toprol -XL 25 mg daily Other recommendations (labs, testing, etc):  Echo in 2 weeks Follow up as an outpatient:  Will schedule f/u    For questions or updates, please contact [facility] Please consult [email or web address] for contact info under Cardiology/STEMI.      Signed, Wendie Hamburg, MD 02/18/2024, 10:09 AM

## 2024-02-18 NOTE — Discharge Instructions (Addendum)
 [name], you came to the hospital due to abdominal pain and were admitted for pancreatitis (inflammation of the pancreas), which was most likely caused by gallstones. During your hospitalization you were also diagnosed with stress induced cardiomyopathy (also called Takotsubo cardiomyopathy). This is a temporary weakening of the heart that most often occurs after a significant stressor. Because of this weakening of your heart you will need repeat imaging of your heart prior to having you gallbladder surgery outpatient. Please make sure to follow up outpatient with the cardiologist and general surgery to complete this testing. - Make an appointment with your PCP in the next week to have your blood labs rechecked.   You were also started on antibiotics for a suspected urinary tract infection. Please make sure to complete your antibiotic treatment as prescribed, even if you are no longer having symptoms. This can help prevent having possibly more complicated infections in the future.    As discussed during your hospitalization it is very important to avoid becoming pregnant while on some of your current medications and while your heart recovers. Attached is more information about birth control options for your consideration and for you to discuss with your provider.    Return to the emergency department if you develop: - Chest pain - Worsening abdominal pain  - Fever/chills  - Shortness of breath - Feel light headed  - Have swelling of your extremities

## 2024-02-18 NOTE — Assessment & Plan Note (Deleted)
 Developed troponin elevation after hospitalization, EP and echo showed LVEF 20-25%.  Euvolemic on exam today. I/O not recorded for the past 48 hours. Messaged RN. Weights trending downwards Mag replenished this AM.  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

## 2024-02-18 NOTE — Plan of Care (Signed)
 Spoke w/ Dr. Davonna Estes (General Surgery) vis secure chat. Per surgery, they have no barriers to discharge. They have canceled her surgery, and plan to see her after her echo to reassess timing of cholecystectomy.

## 2024-02-18 NOTE — TOC Transition Note (Signed)
 Transition of Care [facility]) - Discharge Note   Patient Details  Name: Melissa Guzman MRN: [id number] Date of Birth: Mar 16, 1996  [facility]) CM/SW Contact:  Tom-Johnson, Angelique Ken, RN Phone Number: 02/18/2024, 4:49 PM   Clinical Narrative:     Patient is scheduled for discharge today.  Readmission Risk Assessment done. Outpatient f/u, hospital f/u and discharge instructions on AVS. Prescriptions sent to [facility] pharmacy and patient will receive meds prior discharge. No TOC needs or recommendations noted. Mother, Loyde Rule to transport at discharge.  No further TOC needs noted.      Final next level of care: Home/Self Care Barriers to Discharge: Barriers Resolved   Patient Goals and CMS Choice Patient states their goals for this hospitalization and ongoing recovery are:: To return home CMS Medicare.gov Compare Post Acute Care list provided to:: Patient Choice offered to / list presented to : NA      Discharge Placement                Patient to be transferred to facility by: Mother Name of family member notified: Alice    Discharge Plan and Services Additional resources added to the After Visit Summary for   In-house Referral: NA Discharge Planning Services: CM Consult Post Acute Care Choice: NA          DME Arranged: N/A DME Agency: NA       HH Arranged: NA HH Agency: NA        Social Drivers of Health (SDOH) Interventions SDOH Screenings   Food Insecurity: No Food Insecurity (02/11/2024)  Housing: Low Risk  (02/11/2024)  Transportation Needs: No Transportation Needs (02/11/2024)  Utilities: Not At Risk (02/11/2024)  Depression (PHQ2-9): High Risk (06/22/2023)  Social Connections: Unknown (02/11/2022)   Received from [facility]  Tobacco Use: Low Risk  (02/11/2024)  Recent Concern: Tobacco Use - High Risk (11/20/2023)   Received from [facility]     Readmission Risk Interventions    02/18/2024     4:48 PM  Readmission Risk Prevention Plan  Transportation Screening Complete  PCP or Specialist Appt within 5-7 Days Complete  Home Care Screening Complete  Medication Review (RN CM) Referral to Pharmacy

## 2024-02-18 NOTE — Telephone Encounter (Addendum)
 Please arrange echo 1 month from today. I will schedule follow up approx 2 weeks after that to go over results.   Addendum: please schedule as soon as able. Has surgery 6/6 so ideally would have results before this. Per Dr. Alda Amas please double book him 6/3.

## 2024-02-18 NOTE — Assessment & Plan Note (Deleted)
 HTN: As per acute HF above GERD: Continue pantoprazole  40 mg daily

## 2024-02-18 NOTE — Assessment & Plan Note (Deleted)
 Stable, QTcB 443 on 5/21. - Minimize QT prolonging meds - Daily EKG

## 2024-02-18 NOTE — Discharge Summary (Addendum)
 [facility] Discharge Summary  Patient name: Melissa Guzman Medical record number: [id number] Date of birth: November 27, 1995 Age: 28 y.o. Gender: female Date of Admission: 02/11/2024  Date of Discharge: 02/18/2024 Admitting Physician: Magdalene School, MD  Primary Care Provider: [facility] Medical Consultants: Cardiology, General Surgery  Indication for Hospitalization: Acute Pancreatitis   Brief Hospital Course:  Melissa Guzman is a 28 y.o.female with a history of CAD, HTN, GERD, gallstone pancreatitis s/p attempted cholecystectomy in January 2025 aborted due to adhesions, hepatic steatosis who was admitted originally to [facility] for chest pain, now transferred to the [facility] Medicine Teaching Service at [facility].   Her hospital course is detailed below:  Acute pancreatitis On presentation in the ED patient was tachycardic and mildly hypertensive.  Labs significant for hypokalemia, lipase 60, AST 360 and ALT 73; she was also mildly anemic, no leukocytosis.  Hepatitis panel and group A strep negative.  RUQ US  with gallbladder sludge and hepatic steatosis, but no evidence of acute cholecystitis.  Subsequent CT A/P with evidence of acute pancreatitis. She was admitted for IV fluids pain control. General surgery was consulted. Outpatient cholecystectomy on June 6 canceled and future scheduling depending on cardiac clearance after repeat echo outpatient. Goal potassium> 4 and Mg > 2, was discharged with magnesium  supplementation, given need for frequent repletion inpatient.  Stress-induced cardiomyopathy  Acute systolic heart failure On second day of hospitalization patient began experiencing sharp/stabbing chest pain.  Troponins positive and peaked at 360.  EKG with nonspecific lateral T wave changes, and subsequent EKG with QT prolongation.  Electrophysiology was consulted and they recommended an echo; this showed LVEF 20-25% and  echodensity suspicious for left ventricular thrombus.  Patient was continued on metoprolol  and started Entresto  24-26 mg twice daily but transitioned to losartan 25 mg daily due to soft BP prior to discharge. 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 Pt was discharged on losartan and metoprolol  and will have outpt repeat echo in next 2 weeks.    Simple Cystitis Patient reported dysuria/odd odor to urine while hospitalized. Urinalysis showed leukocytes with bacteria. Started on Keflex  inpatient and transitioned to Cefuroxime at time of discharge (complete 5 day total course, last dose 5/25)  Pancytopenia/Macrocytic Anemia Noted at recent hospitalization. No obvious source of bleeding. Folate, B12, and iron  studies recently normal. Was recommended to follow up with heme/onc outpatient for further work up at previous hospitalization.   Other chronic conditions were medically managed with home medications and formulary alternatives as necessary (HTN, GERD)  PCP Follow-up Recommendations: Repeat BMP - Check potassium and magnesium   Repeat CBC - Check Hgb and see if patient has followed up on recommendation to see hematology/oncology outpatient  Ensure follow up with cardiology for repeat echo Ensure follow up with general surgery for cholecystectomy  Continue discussion about birth control given medication regimen currently includes losartan.    Disposition: Discharge to home   Discharge Condition: Stable   Discharge Exam:  Subjective:  Patient seen sleeping comfortably in bed. Awoke to voice. She denies any current pain, SOB, peripheral edema, or other concerns. Abdominal pain resolved after having a bowel movement yesterday. Discussed that pregnancy is contraindicated with patient's current medication regimen. Patient is currently not sexually active and does not want to start any birth control at this time. Patient also wishes to know a more definitive  plan for her surgery so she can plan childcare for her young daughter.  Physical Exam: General: Age-appropriate, resting comfortably in bed, NAD, alert and at baseline. Cardiovascular: No JVD.  Regular rate and rhythm. Normal S1/S2. No murmurs, rubs, or gallops appreciated. Pulmonary: Clear bilaterally to auscultation anteriorly. Normal WOB on room air, no accessory muscle usage. Abdominal: Normoactive bowel sounds, nondistended. No tenderness to palpation. No rebound or guarding. Skin: Warm and dry. Extremities: No peripheral edema bilaterally.   Significant Procedures:  Echocardiogram 02/12/2024: FINDINGS   Left Ventricle: There is an elliptical shaped echodensity, measuring 1.1  x 0.7 cm, located in the apical septal wall highly suspicious for LV  thrombus. Swirling of contrast material at the apex is noted. Left  ventricular ejection fraction, by  estimation, is 20 to 25%. Left ventricular ejection fraction by 2D MOD  biplane is 24.7 %. The left ventricle has severely decreased function. The  left ventricle demonstrates regional wall motion abnormalities. Definity   contrast agent was given IV to  delineate the left ventricular endocardial borders. Strain was performed  and the global longitudinal strain is indeterminate. The left ventricular  internal cavity size was normal in size. There is no left ventricular  hypertrophy. Left ventricular  diastolic parameters are consistent with Grade II diastolic dysfunction  (pseudonormalization).   CT Abdomen w contrast 02/11/2024: IMPRESSION: 1. Acute pancreatitis. Correlation with pancreatic enzymes recommended. 2. Enlarged and severely fatty liver suggestive of steatohepatitis. Correlation with LFTs recommended. 3. No bowel obstruction. Normal appendix.  MR Cardiac 02/15/2024: IMPRESSION: 1. Findings consistent with Takotsubo cardiomyopathy, with normal LV basal function with mid to apical akinesis, significantly  elevated myocardial T2 values in all apical segments, and no LGE.   2.  No LV thrombus seen   3. Normal LV size, normal wall thickness, and moderate to severe systolic dysfunction (EF 30%). Hypertrabeculation of apical segments   4.  Normal RV size with mild systolic dysfunction (EF 44%)  Significant Labs and Imaging:  Recent Labs  Lab 02/17/24 0346 02/18/24 0311  WBC 4.6 4.4  HGB 11.1* 10.1*  HCT 33.3* 30.9*  PLT 126* 160   Recent Labs  Lab 02/17/24 0346 02/18/24 0311  NA 136 134*  K 4.6 4.1  CL 101 99  CO2 29 26  GLUCOSE 100* 152*  BUN 7 7  CREATININE 0.62 0.70  CALCIUM 8.9 8.8*  MG 2.0 1.7    Results/Tests Pending at Time of Discharge: None  Discharge Medications:  Allergies as of 02/18/2024   No Known Allergies      Medication List     STOP taking these medications    amLODipine  5 MG tablet Commonly known as: NORVASC    ibuprofen  200 MG tablet Commonly known as: ADVIL    oxyCODONE  5 MG immediate release tablet Commonly known as: Oxy IR/ROXICODONE        TAKE these medications    Acetaminophen  Extra Strength 500 MG Tabs Take 2 tablets (1,000 mg total) by mouth every 8 (eight) hours as needed (pain).   cefUROXime 250 MG tablet Commonly known as: CEFTIN Take 1 tablet (250 mg total) by mouth 2 (two) times daily with a meal.   losartan 25 MG tablet Commonly known as: COZAAR Take 1 tablet (25 mg total) by mouth daily. Start taking on: Feb 19, 2024   magnesium  oxide 400 MG tablet Commonly known as: MAG-OX Take 1 tablet (400 mg total) by mouth daily.   metoprolol  succinate 25 MG 24 hr tablet Commonly known as: TOPROL -XL Take 1 tablet (25 mg total) by mouth daily. Start taking on: Feb 19, 2024  nitroGLYCERIN  0.4 MG SL tablet Commonly known as: NITROSTAT  Place 1 tablet (0.4 mg total) under the tongue every 5 (five) minutes as needed for chest pain.   pantoprazole  40 MG tablet Commonly known as: PROTONIX  Take 1 tablet (40 mg total) by  mouth daily.        Discharge Instructions: Please refer to Patient Instructions section of EMR for full details.  Patient was counseled important signs and symptoms that should prompt return to medical care, changes in medications, dietary instructions, activity restrictions, and follow up appointments.   Follow-Up Appointments: Advised to f/u w/ PCP in next week.  Repeat echo w/ Cardiology in next 2 weeks.   Albin Huh, MD 02/18/2024, 4:16 PM

## 2024-02-18 NOTE — Plan of Care (Signed)
   Problem: Clinical Measurements: Goal: Ability to maintain clinical measurements within normal limits will improve Outcome: Progressing Goal: Will remain free from infection Outcome: Progressing

## 2024-02-18 NOTE — Assessment & Plan Note (Deleted)
 Reported 5/20. UA most consistent with UTI. Currently asymptomatic. - Keflex  250 mg Q6H for 5 days (5/20-5/24)

## 2024-02-18 NOTE — Assessment & Plan Note (Deleted)
 Presented with elevated lipase, some gallbladder sluge on RUQ US . - Pain regimen: Acetaminophen  650 mg Q6h PRN - Maalox Q4h PRN - General Surgery consulted, plan of care note 5/21: - Outpatient cholecystectomy initially planned June 6th - Unlikely surgical intervention in setting of cardiomyopathy, may need repeat echo prior to surgery

## 2024-02-25 ENCOUNTER — Ambulatory Visit (HOSPITAL_COMMUNITY): Admission: RE | Admit: 2024-02-25 | Source: Ambulatory Visit | Attending: Cardiology

## 2024-02-25 ENCOUNTER — Encounter (HOSPITAL_COMMUNITY): Payer: Self-pay | Admitting: Cardiology

## 2024-02-27 ENCOUNTER — Emergency Department (HOSPITAL_BASED_OUTPATIENT_CLINIC_OR_DEPARTMENT_OTHER)

## 2024-02-27 ENCOUNTER — Other Ambulatory Visit: Payer: Self-pay

## 2024-02-27 ENCOUNTER — Emergency Department (HOSPITAL_BASED_OUTPATIENT_CLINIC_OR_DEPARTMENT_OTHER): Admission: EM | Admit: 2024-02-27 | Discharge: 2024-02-27 | Disposition: A | Discharge status: Home / Self Care

## 2024-02-27 ENCOUNTER — Encounter (HOSPITAL_BASED_OUTPATIENT_CLINIC_OR_DEPARTMENT_OTHER): Payer: Self-pay | Admitting: Emergency Medicine

## 2024-02-27 DIAGNOSIS — R791 Abnormal coagulation profile: Secondary | ICD-10-CM | POA: Diagnosis not present

## 2024-02-27 DIAGNOSIS — R9431 Abnormal electrocardiogram [ECG] [EKG]: Secondary | ICD-10-CM

## 2024-02-27 DIAGNOSIS — Z79899 Other long term (current) drug therapy: Secondary | ICD-10-CM | POA: Insufficient documentation

## 2024-02-27 DIAGNOSIS — R079 Chest pain, unspecified: Secondary | ICD-10-CM | POA: Diagnosis present

## 2024-02-27 DIAGNOSIS — R1011 Right upper quadrant pain: Secondary | ICD-10-CM | POA: Diagnosis not present

## 2024-02-27 DIAGNOSIS — R7401 Elevation of levels of liver transaminase levels: Secondary | ICD-10-CM | POA: Diagnosis not present

## 2024-02-27 DIAGNOSIS — R748 Abnormal levels of other serum enzymes: Secondary | ICD-10-CM | POA: Insufficient documentation

## 2024-02-27 DIAGNOSIS — R112 Nausea with vomiting, unspecified: Secondary | ICD-10-CM | POA: Diagnosis not present

## 2024-02-27 DIAGNOSIS — I4581 Long QT syndrome: Secondary | ICD-10-CM | POA: Diagnosis not present

## 2024-02-27 LAB — URINALYSIS, ROUTINE W REFLEX MICROSCOPIC
Bilirubin Urine: NEGATIVE
Glucose, UA: NEGATIVE mg/dL
Ketones, ur: >80 mg/dL — AB
Nitrite: NEGATIVE
Protein, ur: 100 mg/dL — AB
Specific Gravity, Urine: 1.026 (ref 1.005–1.030)
WBC, UA: >50 WBC/hpf (ref 0–5)
pH: 6 (ref 5.0–8.0)

## 2024-02-27 LAB — COMPREHENSIVE METABOLIC PANEL WITH GFR
ALT: 99 U/L — ABNORMAL HIGH (ref 0–44)
AST: 197 U/L — ABNORMAL HIGH (ref 15–41)
Albumin: 4.7 g/dL (ref 3.5–5.0)
Alkaline Phosphatase: 90 U/L (ref 38–126)
Anion gap: 22 — ABNORMAL HIGH (ref 5–15)
BUN: 10 mg/dL (ref 6–20)
CO2: 24 mmol/L (ref 22–32)
Calcium: 10.1 mg/dL (ref 8.9–10.3)
Chloride: 96 mmol/L — ABNORMAL LOW (ref 98–111)
Creatinine, Ser: 0.87 mg/dL (ref 0.44–1.00)
GFR, Estimated: >60 mL/min (ref >60)
Glucose, Bld: 82 mg/dL (ref 70–99)
Potassium: 4 mmol/L (ref 3.5–5.1)
Sodium: 143 mmol/L (ref 135–145)
Total Bilirubin: 0.7 mg/dL (ref 0.0–1.2)
Total Protein: 8.4 g/dL — ABNORMAL HIGH (ref 6.5–8.1)

## 2024-02-27 LAB — CBC
HCT: 32.5 % — ABNORMAL LOW (ref 36.0–46.0)
Hemoglobin: 10.8 g/dL — ABNORMAL LOW (ref 12.0–15.0)
MCH: 33 pg (ref 26.0–34.0)
MCHC: 33.2 g/dL (ref 30.0–36.0)
MCV: 99.4 fL (ref 80.0–100.0)
Platelets: 392 10*3/uL (ref 150–400)
RBC: 3.27 MIL/uL — ABNORMAL LOW (ref 3.87–5.11)
RDW: 12.4 % (ref 11.5–15.5)
WBC: 7.6 10*3/uL (ref 4.0–10.5)
nRBC: 0 % (ref 0.0–0.2)

## 2024-02-27 LAB — D-DIMER, QUANTITATIVE: D-Dimer, Quant: 0.64 ug{FEU}/mL — ABNORMAL HIGH (ref 0.00–0.50)

## 2024-02-27 LAB — PREGNANCY, URINE: Preg Test, Ur: NEGATIVE

## 2024-02-27 LAB — TROPONIN T, HIGH SENSITIVITY
Troponin T High Sensitivity: 17 ng/L (ref <19)
Troponin T High Sensitivity: 18 ng/L (ref <19)

## 2024-02-27 LAB — LIPASE, BLOOD: Lipase: 43 U/L (ref 11–51)

## 2024-02-27 LAB — PRO BRAIN NATRIURETIC PEPTIDE: Pro Brain Natriuretic Peptide: 229 pg/mL (ref <300.0)

## 2024-02-27 MED ORDER — LACTATED RINGERS IV BOLUS
500.0000 mL | Freq: Once | INTRAVENOUS | Status: AC
  Administered 2024-02-27: 500 mL via INTRAVENOUS

## 2024-02-27 MED ORDER — KETOROLAC TROMETHAMINE 15 MG/ML IJ SOLN
15.0000 mg | Freq: Once | INTRAMUSCULAR | Status: AC
  Administered 2024-02-27: 15 mg via INTRAVENOUS
  Filled 2024-02-27: qty 1

## 2024-02-27 MED ORDER — LORAZEPAM 1 MG PO TABS: 1.0000 mg | ORAL_TABLET | Freq: Three times a day (TID) | ORAL | 0 refills | Status: DC | PRN

## 2024-02-27 MED ORDER — IOHEXOL 350 MG/ML SOLN
75.0000 mL | Freq: Once | INTRAVENOUS | Status: AC | PRN
  Administered 2024-02-27: 75 mL via INTRAVENOUS

## 2024-02-27 MED ORDER — LORAZEPAM 2 MG/ML IJ SOLN
1.0000 mg | Freq: Once | INTRAMUSCULAR | Status: AC
  Administered 2024-02-27: 1 mg via INTRAVENOUS
  Filled 2024-02-27: qty 1

## 2024-02-27 MED ORDER — ACETAMINOPHEN 325 MG PO TABS
650.0000 mg | ORAL_TABLET | Freq: Once | ORAL | Status: AC
  Administered 2024-02-27: 650 mg via ORAL
  Filled 2024-02-27: qty 2

## 2024-02-27 NOTE — Discharge Instructions (Signed)
 Your workup today was reassuring overall.  Please take the Ativan as needed for nausea.  Please follow-up with the cardiologist as scheduled on Monday.  If you have worsening symptoms please return to the ER or better yet go to [facility] or [facility] as you may require admission.  Return to the emergency department for worsening symptoms.

## 2024-02-27 NOTE — ED Triage Notes (Signed)
 Nauseated for past week, not keeping anything down. Is scheduled to have gallbladder removed on the 6th. She is also having chest pain, she was hospitalized recently for her gallbladder and was told her heart was stressed.

## 2024-02-27 NOTE — ED Provider Notes (Signed)
 [place] [facility]MENT AT [facility] Provider Note   CSN: [id number] Arrival date & time: 02/27/24  0825     History  Chief Complaint  Patient presents with   Abdominal Pain    Melissa Guzman is a 28 y.o. female.  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  The patient states that the symptoms have been going on since she left the hospital.  CAD she is scheduled for cardiac evaluation on Monday and plans for cholecystectomy on Friday.  She came to the emergency department today due to worsening symptoms.  She states the pain is mostly in the center of her chest and does not radiate.  She reports some mild shortness of breath with this.  She denies any fevers.  She has had nausea with vomiting but denies any bloody or bilious emesis.  She denies any leg pain or swelling.  She was in the hospital for 1 week last week for similar symptoms.   Abdominal Pain Associated symptoms: chest pain, nausea and vomiting        Home Medications Prior to Admission medications   Medication Sig Start Date End Date Taking? Authorizing Provider  LORazepam (ATIVAN) 1 MG tablet Take 1 tablet (1 mg total) by mouth 3 (three) times daily as needed (nausea and vomiting). 02/27/24  Yes Carin Charleston, MD  acetaminophen  (TYLENOL ) 500 MG tablet Take 2 tablets (1,000 mg total) by mouth every 8 (eight) hours as needed (pain). 03/05/22   Rosalita Combe, MD  cefUROXime  (CEFTIN ) 250 MG tablet Take 1 tablet (250 mg total) by mouth 2 (two) times daily with a meal. 02/18/24   Albin Huh, MD  losartan  (COZAAR ) 25 MG tablet Take 1 tablet (25 mg total) by mouth daily. 02/19/24   Albin Huh, MD  magnesium  oxide (MAG-OX) 400 MG tablet Take 1 tablet (400 mg total) by mouth daily.  02/18/24   Albin Huh, MD  metoprolol  succinate (TOPROL -XL) 25 MG 24 hr tablet Take 1 tablet (25 mg total) by mouth daily. 02/19/24   Albin Huh, MD  nitroGLYCERIN  (NITROSTAT ) 0.4 MG SL tablet Place 1 tablet (0.4 mg total) under the tongue every 5 (five) minutes as needed for chest pain. 02/18/24   Albin Huh, MD  pantoprazole  (PROTONIX ) 40 MG tablet Take 1 tablet (40 mg total) by mouth daily. 11/18/23   Adhikari, Amrit, MD      Allergies    Patient has no known allergies.    Review of Systems   Review of Systems  Cardiovascular:  Positive for chest pain.  Gastrointestinal:  Positive for abdominal pain, nausea and vomiting.  All other systems reviewed and are negative.   Physical Exam Updated Vital Signs BP 100/81   Pulse 90   Temp 98.2 F (36.8 C)   Resp 19   Wt 55.3 kg   LMP 01/28/2024   SpO2 100%   Breastfeeding No   BMI 23.05 kg/m  Physical Exam Vitals and nursing note reviewed.   Gen: NAD Eyes: PERRL, EOMI HEENT: no oropharyngeal swelling Neck: trachea midline Resp: clear to auscultation bilaterally Card: RRR, no murmurs, rubs, or gallops Abd: Mild diffuse tenderness with maximal tenderness over the epigastrium and right upper quadrant without guarding or rebound Extremities: no calf tenderness, no edema Vascular: 2+ radial pulses bilaterally, 2+ DP pulses  bilaterally Skin: no rashes Psyc: acting appropriately   ED Results / Procedures / Treatments   Labs (all labs ordered are listed, but only abnormal results are displayed) Labs Reviewed  CBC - Abnormal; Notable for the following components:      Result Value   RBC 3.27 (*)    Hemoglobin 10.8 (*)    HCT 32.5 (*)    All other components within normal limits  URINALYSIS, ROUTINE W REFLEX MICROSCOPIC - Abnormal; Notable for the following components:   APPearance CLOUDY (*)    Hgb urine dipstick SMALL (*)    Ketones, ur >80 (*)    Protein, ur 100 (*)    Leukocytes,Ua LARGE (*)    Bacteria, UA FEW (*)     Non Squamous Epithelial 0-5 (*)    All other components within normal limits  COMPREHENSIVE METABOLIC PANEL WITH GFR - Abnormal; Notable for the following components:   Chloride 96 (*)    Total Protein 8.4 (*)    AST 197 (*)    ALT 99 (*)    Anion gap 22 (*)    All other components within normal limits  D-DIMER, QUANTITATIVE (NOT AT [facility]) - Abnormal; Notable for the following components:   D-Dimer, Quant 0.64 (*)    All other components within normal limits  PREGNANCY, URINE  LIPASE, BLOOD  PRO BRAIN NATRIURETIC PEPTIDE  TROPONIN T, HIGH SENSITIVITY  TROPONIN T, HIGH SENSITIVITY    EKG EKG Interpretation Date/Time:  Saturday Feb 27 2024 08:41:48 EDT Ventricular Rate:  79 PR Interval:  107 QRS Duration:  84 QT Interval:  469 QTC Calculation: 538 R Axis:   69  Text Interpretation: Sinus arrhythmia Short PR interval Nonspecific ST-T changes, appears similar to previous Prolonged QT interval Confirmed by Abner Hoffman [phone]) on 02/27/2024 9:15:07 AM  Radiology CT Angio Chest PE W and/or Wo Contrast Result Date: 02/27/2024 CLINICAL DATA:  Pulmonary embolism EXAM: CT ANGIOGRAPHY CHEST WITH CONTRAST TECHNIQUE: Multidetector CT imaging of the chest was performed using the standard protocol during bolus administration of intravenous contrast. Multiplanar CT image reconstructions and MIPs were obtained to evaluate the vascular anatomy. Multiplanar image (3D post-processing) reconstructions and MIPs were obtained to evaluate the vascular anatomy. RADIATION DOSE REDUCTION: This exam was performed according to the departmental dose-optimization program which includes automated exposure control, adjustment of the mA and/or kV according to patient size and/or use of iterative reconstruction technique. CONTRAST:  75mL OMNIPAQUE  IOHEXOL  350 MG/ML SOLN COMPARISON:  None Available. FINDINGS: Cardiovascular: Satisfactory opacification of the pulmonary arteries to the segmental level. No evidence of  pulmonary embolism. Normal heart size. No pericardial effusion. 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 Mediastinum/Nodes: No enlarged mediastinal or hilar lymph nodes. Thyroid  gland, trachea, and esophagus demonstrate no significant findings. Prominent axillary lymph nodes are present bilaterally which may be reactive. Lungs/Pleura: Lungs are clear. No pleural effusion or pneumothorax. Upper Abdomen: Diffuse low attenuation of the liver. Musculoskeletal: No chest wall abnormality. No acute or significant osseous findings. Review of the MIP images confirms the above findings. IMPRESSION: 1. No evidence of pulmonary embolism to the segmental level. 2. Imaging features suggestive of hepatic steatosis. 3. Prominent axillary lymph nodes bilaterally which may be reactive. Electronically Signed   By: Reagan Camera M.D.   On: 02/27/2024 12:18   US  Abdomen Limited RUQ (LIVER/GB) Result Date: 02/27/2024 CLINICAL DATA:  151471 RUQ pain 151471 EXAM: ULTRASOUND ABDOMEN LIMITED RIGHT UPPER QUADRANT COMPARISON:  02/11/2024. FINDINGS: Gallbladder: There is layering sludge at  the fundus. No gallstones, pericholecystic fluid or wall thickening visualized. Common bile duct: Diameter: 0.3 cm. Liver: Liver is hyperechoic consistent with fatty infiltration. No focal hepatic lesions identified. No intrahepatic ductal dilatation. Hepatopetal portal vein flow. IMPRESSION: Gallbladder sludge. Fatty infiltration of the liver. Electronically Signed   By: Sydell Eva M.D.   On: 02/27/2024 09:51   DG Chest Portable 1 View Result Date: 02/27/2024 CLINICAL DATA:  Shortness of breath and chest pain EXAM: PORTABLE CHEST 1 VIEW COMPARISON:  02/11/2024 FINDINGS: The lungs are clear without focal pneumonia, edema, pneumothorax or pleural effusion. The cardiopericardial silhouette is within normal limits for size. No acute bony abnormality. Telemetry leads overlie the chest. IMPRESSION: No active disease. Electronically Signed    By: Donnal Fusi M.D.   On: 02/27/2024 08:57    Procedures Procedures    Medications Ordered in ED Medications  LORazepam (ATIVAN) injection 1 mg (1 mg Intravenous Given 02/27/24 0910)  lactated ringers  bolus 500 mL (0 mLs Intravenous Stopped 02/27/24 1429)  iohexol  (OMNIPAQUE ) 350 MG/ML injection 75 mL (75 mLs Intravenous Contrast Given 02/27/24 1143)  ketorolac  (TORADOL ) 15 MG/ML injection 15 mg (15 mg Intravenous Given 02/27/24 1227)    ED Course/ Medical Decision Making/ A&P                                 Medical Decision Making 28 year old female with recently diagnosed Takotsubo's cardiomyopathy and gallbladder sludge presenting to the emergency department today with upper abdominal pain and chest pain.  I will further evaluate the patient here with basic labs as well as an EKG, chest x-Yun, and troponin for further evaluation for ACS, pulmonary edema, pulmonary infiltrates, pneumothorax.  Given her recent hospitalization we will also obtain a D-dimer to evaluate for pulmonary embolism.  On her EKG she does have some nonspecific changes and a QTc of 538.  Will give the patient Ativan here for her nausea and avoid QTc prolonging medications.  Given her shortness of breath and low ejection fraction we will hold off on IV fluids at this time.  Will obtain LFTs and lipase to evaluate for hepatobiliary pathology or pancreatitis as well as a repeat right upper quadrant ultrasound to evaluate for cholecystitis.  I will reevaluate for ultimate disposition.  The patient's labs are reassuring.  D-dimer is elevated.  CT angiogram is negative for pulmonary embolism.  Her ultrasound does not show any new findings.  Her AST and ALT are very mildly elevated.  Bilirubin is within normal limits.  I did obtain another EKG as her first 1 did show some QT prolongation and this does look to be the case on the second 1.  Her symptoms did improve with the Ativan.  She does have cardiology follow-up on Monday and  is scheduled for possible surgery on Friday.  Amount and/or Complexity of Data Reviewed Labs: ordered. Radiology: ordered.  Risk Prescription drug management.           Final Clinical Impression(s) / ED Diagnoses Final diagnoses:  Nausea and vomiting, unspecified vomiting type  QT prolongation    Rx / DC Orders ED Discharge Orders          Ordered    LORazepam (ATIVAN) 1 MG tablet  3 times daily PRN        02/27/24 1549              Carin Charleston, MD 02/27/24 1549

## 2024-02-27 NOTE — ED Notes (Signed)
Reviewed discharge instructions, medications, and home care with pt. Pt verbalized understanding and had no further questions. Pt exited ED without complications.

## 2024-02-27 NOTE — ED Notes (Signed)
 Patient requesting pain medication. Secure chat sent to provider.

## 2024-02-28 ENCOUNTER — Encounter: Payer: Self-pay | Admitting: Certified Nurse Midwife

## 2024-02-28 NOTE — Progress Notes (Unsigned)
 Cardiology Office Note:    Date:  02/29/2024   ID:  Melissa Guzman, DOB [birth year], MRN [id number]  PCP:  [facility]  Cardiologist:  None  Electrophysiologist:  None   Referring MD: [facility]   Chief Complaint  Patient presents with   takotsubo cardiomyopathy    History of Present Illness:    Melissa Guzman is a 28 y.o. female with a hx of Takotsubo cardiomyopathy, pancreatitis who presents for follow-up.  She was admitted 01/2024 with gallstone pancreatitis.  Echocardiogram showed EF 20 to 25%, possible LV thrombus, moderate RV dysfunction.  Cardiac MRI was done which showed findings consistent with Takotsubo cardiomyopathy, no LV thrombus, LVEF 30%, RVEF 44%.  Since discharge from hospital, she reports she is doing okay.  Denies any chest pain, dyspnea, lightheadedness, syncope, lower extremity edema, or palpitations.   Past Medical History:  Diagnosis Date   Acute blood loss anemia 03/01/2022   Anemia    Anxiety    COVID-19 affecting pregnancy in third trimester 02/04/2022   +01/28/2022   Endometritis 03/05/2022   Hypertension    Postpartum septic endometritis 03/09/2022   UTI (urinary tract infection) 05/26/2023    Past Surgical History:  Procedure Laterality Date   CESAREAN SECTION N/A 03/01/2022   Procedure: CESAREAN SECTION;  Surgeon: Othelia Blinks, MD;  Location: [facility] LD ORS;  Service: Obstetrics;  Laterality: N/A;   DIAGNOSTIC LAPAROSCOPIC LIVER BIOPSY N/A 10/09/2023   Procedure: DIAGNOSTIC LAPAROSCOPY;  Surgeon: Cannon Champion, MD;  Location: [facility] OR;  Service: General;  Laterality: N/A;   IR RADIOLOGIST EVAL & MGMT  03/28/2022   IR RADIOLOGIST EVAL & MGMT  04/10/2022    Current Medications: Current Meds  Medication Sig   acetaminophen  (TYLENOL ) 500 MG tablet Take 2 tablets (1,000 mg total) by mouth every 8 (eight) hours as needed (pain).   cefUROXime  (CEFTIN ) 250 MG tablet Take 1 tablet (250 mg total) by mouth 2  (two) times daily with a meal.   LORazepam (ATIVAN) 1 MG tablet Take 1 tablet (1 mg total) by mouth 3 (three) times daily as needed (nausea and vomiting).   losartan  (COZAAR ) 25 MG tablet Take 1 tablet (25 mg total) by mouth daily.   magnesium  oxide (MAG-OX) 400 MG tablet Take 1 tablet (400 mg total) by mouth daily.   metoprolol  succinate (TOPROL -XL) 25 MG 24 hr tablet Take 1 tablet (25 mg total) by mouth daily.   nitroGLYCERIN  (NITROSTAT ) 0.4 MG SL tablet Place 1 tablet (0.4 mg total) under the tongue every 5 (five) minutes as needed for chest pain.   pantoprazole  (PROTONIX ) 40 MG tablet Take 1 tablet (40 mg total) by mouth daily.   [DISCONTINUED] ondansetron  (ZOFRAN ) 4 MG tablet Take 4 mg by mouth every 8 (eight) hours as needed for nausea or vomiting.     Allergies:   Patient has no known allergies.   Social History   Socioeconomic History   Marital status: Single    Spouse name: Not on file   Number of children: 1   Years of education: Not on file   Highest education level: Not on file  Occupational History   Not on file  Tobacco Use   Smoking status: Never   Smokeless tobacco: Never  Vaping Use   Vaping status: Never Used  Substance and Sexual Activity   Alcohol use: Not Currently   Drug use: Yes    Frequency: 3.0 times per week    Types: Marijuana  Sexual activity: Yes    Birth control/protection: None  Other Topics Concern   Not on file  Social History Narrative   Not on file   Social Drivers of Health   Financial Resource Strain: Not on file  Food Insecurity: No Food Insecurity (02/11/2024)   Hunger Vital Sign    Worried About Running Out of Food in the Last Year: Never true    Ran Out of Food in the Last Year: Never true  Transportation Needs: No Transportation Needs (02/11/2024)   PRAPARE - Administrator, Civil Service (Medical): No    Lack of Transportation (Non-Medical): No  Physical Activity: Not on file  Stress: Not on file  Social  Connections: Unknown (02/11/2022)   Received from [facility]   Social Network    Social Network: Not on file     Family History: The patient's family history is not on file.  ROS:   Please see the history of present illness.     All other systems reviewed and are negative.  EKGs/Labs/Other Studies Reviewed:    The following studies were reviewed today:   EKG:   1625: Normal sinus rhythm, rate 80, QTc 525, diffuse T wave inversions  Recent Labs: 05/26/2023: TSH 2.190 02/18/2024: Magnesium  1.7 02/27/2024: ALT 99; BUN 10; Creatinine, Ser 0.87; Hemoglobin 10.8; Platelets 392; Potassium 4.0; Pro Brain Natriuretic Peptide 229.0; Sodium 143  Recent Lipid Panel    Component Value Date/Time   TRIG 80 10/06/2023 0041    Physical Exam:    VS:  BP 110/70   Pulse 80   Ht 5\' 1"  (1.549 m)   Wt 116 lb 12.8 oz (53 kg)   LMP 01/28/2024   SpO2 98%   BMI 22.07 kg/m     Wt Readings from Last 3 Encounters:  02/29/24 116 lb 12.8 oz (53 kg)  02/27/24 122 lb (55.3 kg)  02/18/24 121 lb 0.5 oz (54.9 kg)     GEN:  Well nourished, well developed in no acute distress HEENT: Normal NECK: No JVD; No carotid bruits LYMPHATICS: No lymphadenopathy CARDIAC: RRR, no murmurs, rubs, gallops RESPIRATORY:  Clear to auscultation without rales, wheezing or rhonchi  ABDOMEN: Soft, non-tender, non-distended MUSCULOSKELETAL:  No edema; No deformity  SKIN: Warm and dry NEUROLOGIC:  Alert and oriented x 3 PSYCHIATRIC:  Normal affect   ASSESSMENT:    1. Stress-induced cardiomyopathy   2. Pre-op evaluation   3. QT prolongation    PLAN:    Takotsubo cardiomyopathy: She was admitted 01/2024 with gallstone pancreatitis.  Echocardiogram showed EF 20 to 25%, possible LV thrombus, moderate RV dysfunction.  Cardiac MRI was done which showed findings consistent with Takotsubo cardiomyopathy, no LV thrombus, LVEF 30%, RVEF 44%. - She was started on Entresto  during admission but did not  tolerate due to low BP.  Switched to losartan  25 mg daily - Continue Toprol -XL 25 mg daily - Check echocardiogram to evaluate for improvement in LV systolic function.  Preop evaluation: Prior to cholecystectomy.  This was scheduled for 6/6.  Recommended she obtain echocardiogram to evaluate for improvement in systolic function prior to surgery.  Her echocardiogram was scheduled for 5/29.  She was a no-show for her appointment.  Would recommend we repeat echo to confirm her LV systolic function has improved and she has recovered from stress-induced cardiomyopathy prior to proceeding with surgery.  QT prolongation: Noted during admission, can be seen in setting of stress-induced cardiomyopathy.  EKG today shows QTc 525.  Avoid QT prolonging agents.  RTC in 3 months   Medication Adjustments/Labs and Tests Ordered: Current medicines are reviewed at length with the patient today.  Concerns regarding medicines are outlined above.  Orders Placed This Encounter  Procedures   EKG 12-Lead   ECHOCARDIOGRAM COMPLETE   No orders of the defined types were placed in this encounter.   Patient Instructions  Medication Instructions:  Continue current medications *If you need a refill on your cardiac medications before your next appointment, please call your pharmacy*  Lab Work: none If you have labs (blood work) drawn today and your tests are completely normal, you will receive your results only by: MyChart Message (if you have MyChart) OR A paper copy in the mail If you have any lab test that is abnormal or we need to change your treatment, we will call you to review the results.  Testing/Procedures: Allene Ards Your physician has requested that you have an echocardiogram. Echocardiography is a painless test that uses sound waves to create images of your heart. It provides your doctor with information about the size and shape of your heart and how well your heart's chambers and valves are working. This  procedure takes approximately one hour. There are no restrictions for this procedure. Please do NOT wear cologne, perfume, aftershave, or lotions (deodorant is allowed). Please arrive 15 minutes prior to your appointment time.  Please note: We ask at that you not bring children with you during ultrasound (echo/ vascular) testing. Due to room size and safety concerns, children are not allowed in the ultrasound rooms during exams. Our front office staff cannot provide observation of children in our lobby area while testing is being conducted. An adult accompanying a patient to their appointment will only be allowed in the ultrasound room at the discretion of the ultrasound technician under special circumstances. We apologize for any inconvenience.   Follow-Up: At [facility], you and your health needs are our priority.  As part of our continuing mission to provide you with exceptional heart care, our providers are all part of one team.  This team includes your primary Cardiologist (physician) and Advanced Practice Providers or APPs (Physician Assistants and Nurse Practitioners) who all work together to provide you with the care you need, when you need it.  Your next appointment:   3 month(s)  Provider:   Dr. Alda Amas  We recommend signing up for the patient portal called "MyChart".  Sign up information is provided on this After Visit Summary.  MyChart is used to connect with patients for Virtual Visits (Telemedicine).  Patients are able to view lab/test results, encounter notes, upcoming appointments, etc.  Non-urgent messages can be sent to your provider as well.   To learn more about what you can do with MyChart, go to [email or web address].   Other Instructions none       Signed, Wendie Hamburg, MD  02/29/2024 10:04 AM    [facility]

## 2024-02-29 ENCOUNTER — Encounter: Payer: Self-pay | Admitting: *Deleted

## 2024-02-29 ENCOUNTER — Ambulatory Visit: Attending: Cardiology | Admitting: Cardiology

## 2024-02-29 ENCOUNTER — Encounter: Payer: Self-pay | Admitting: Cardiology

## 2024-02-29 VITALS — BP 110/70 | HR 80 | Ht 61.0 in | Wt 116.8 lb

## 2024-02-29 DIAGNOSIS — R9431 Abnormal electrocardiogram [ECG] [EKG]: Secondary | ICD-10-CM | POA: Insufficient documentation

## 2024-02-29 DIAGNOSIS — Z01818 Encounter for other preprocedural examination: Secondary | ICD-10-CM | POA: Insufficient documentation

## 2024-02-29 DIAGNOSIS — I5181 Takotsubo syndrome: Secondary | ICD-10-CM | POA: Insufficient documentation

## 2024-02-29 NOTE — Patient Instructions (Addendum)
 Medication Instructions:  Continue current medications *If you need a refill on your cardiac medications before your next appointment, please call your pharmacy*  Lab Work: none If you have labs (blood work) drawn today and your tests are completely normal, you will receive your results only by: MyChart Message (if you have MyChart) OR A paper copy in the mail If you have any lab test that is abnormal or we need to change your treatment, we will call you to review the results.  Testing/Procedures: Allene Ards Your physician has requested that you have an echocardiogram. Echocardiography is a painless test that uses sound waves to create images of your heart. It provides your doctor with information about the size and shape of your heart and how well your heart's chambers and valves are working. This procedure takes approximately one hour. There are no restrictions for this procedure. Please do NOT wear cologne, perfume, aftershave, or lotions (deodorant is allowed). Please arrive 15 minutes prior to your appointment time.  Please note: We ask at that you not bring children with you during ultrasound (echo/ vascular) testing. Due to room size and safety concerns, children are not allowed in the ultrasound rooms during exams. Our front office staff cannot provide observation of children in our lobby area while testing is being conducted. An adult accompanying a patient to their appointment will only be allowed in the ultrasound room at the discretion of the ultrasound technician under special circumstances. We apologize for any inconvenience.   Follow-Up: At [facility], you and your health needs are our priority.  As part of our continuing mission to provide you with exceptional heart care, our providers are all part of one team.  This team includes your primary Cardiologist (physician) and Advanced Practice Providers or APPs (Physician Assistants and Nurse Practitioners) who all work together  to provide you with the care you need, when you need it.  Your next appointment:   3 month(s)  Provider:   Dr. Alda Amas  We recommend signing up for the patient portal called "MyChart".  Sign up information is provided on this After Visit Summary.  MyChart is used to connect with patients for Virtual Visits (Telemedicine).  Patients are able to view lab/test results, encounter notes, upcoming appointments, etc.  Non-urgent messages can be sent to your provider as well.   To learn more about what you can do with MyChart, go to [email or web address].   Other Instructions Your provider has recommended that you do not take Zofran  for nausea/ vomiting and follow up with PCP IF YOU HAVE ANY NAUSEA

## 2024-03-04 ENCOUNTER — Ambulatory Visit (HOSPITAL_COMMUNITY): Admit: 2024-03-04 | Admitting: General Surgery

## 2024-03-04 SURGERY — LAPAROSCOPIC CHOLECYSTECTOMY
Anesthesia: General

## 2024-03-09 ENCOUNTER — Ambulatory Visit: Admitting: Cardiology

## 2024-03-24 ENCOUNTER — Ambulatory Visit: Admitting: Nurse Practitioner

## 2024-03-27 ENCOUNTER — Emergency Department (HOSPITAL_BASED_OUTPATIENT_CLINIC_OR_DEPARTMENT_OTHER)

## 2024-03-27 ENCOUNTER — Emergency Department (HOSPITAL_BASED_OUTPATIENT_CLINIC_OR_DEPARTMENT_OTHER): Admitting: Radiology

## 2024-03-27 ENCOUNTER — Other Ambulatory Visit: Payer: Self-pay

## 2024-03-27 ENCOUNTER — Emergency Department (HOSPITAL_BASED_OUTPATIENT_CLINIC_OR_DEPARTMENT_OTHER)
Admission: EM | Admit: 2024-03-27 | Discharge: 2024-03-27 | Disposition: A | Discharge status: Home / Self Care | Attending: Emergency Medicine | Admitting: Emergency Medicine

## 2024-03-27 DIAGNOSIS — Z79899 Other long term (current) drug therapy: Secondary | ICD-10-CM | POA: Insufficient documentation

## 2024-03-27 DIAGNOSIS — N3001 Acute cystitis with hematuria: Secondary | ICD-10-CM | POA: Diagnosis not present

## 2024-03-27 DIAGNOSIS — I10 Essential (primary) hypertension: Secondary | ICD-10-CM | POA: Insufficient documentation

## 2024-03-27 DIAGNOSIS — K828 Other specified diseases of gallbladder: Secondary | ICD-10-CM | POA: Insufficient documentation

## 2024-03-27 DIAGNOSIS — R1011 Right upper quadrant pain: Secondary | ICD-10-CM | POA: Diagnosis present

## 2024-03-27 LAB — CBC
HCT: 30.8 % — ABNORMAL LOW (ref 36.0–46.0)
Hemoglobin: 10.6 g/dL — ABNORMAL LOW (ref 12.0–15.0)
MCH: 32.3 pg (ref 26.0–34.0)
MCHC: 34.4 g/dL (ref 30.0–36.0)
MCV: 93.9 fL (ref 80.0–100.0)
Platelets: 191 10*3/uL (ref 150–400)
RBC: 3.28 MIL/uL — ABNORMAL LOW (ref 3.87–5.11)
RDW: 13.2 % (ref 11.5–15.5)
WBC: 5.1 10*3/uL (ref 4.0–10.5)
nRBC: 0 % (ref 0.0–0.2)

## 2024-03-27 LAB — URINALYSIS, ROUTINE W REFLEX MICROSCOPIC
Bilirubin Urine: NEGATIVE
Glucose, UA: NEGATIVE mg/dL
Nitrite: NEGATIVE
Protein, ur: >300 mg/dL — AB
Specific Gravity, Urine: 1.034 — ABNORMAL HIGH (ref 1.005–1.030)
WBC, UA: >50 WBC/hpf (ref 0–5)
pH: 6 (ref 5.0–8.0)

## 2024-03-27 LAB — PREGNANCY, URINE: Preg Test, Ur: NEGATIVE

## 2024-03-27 LAB — COMPREHENSIVE METABOLIC PANEL WITH GFR
ALT: 25 U/L (ref 0–44)
AST: 109 U/L — ABNORMAL HIGH (ref 15–41)
Albumin: 4 g/dL (ref 3.5–5.0)
Alkaline Phosphatase: 106 U/L (ref 38–126)
Anion gap: 14 (ref 5–15)
BUN: <5 mg/dL — ABNORMAL LOW (ref 6–20)
CO2: 25 mmol/L (ref 22–32)
Calcium: 8.9 mg/dL (ref 8.9–10.3)
Chloride: 102 mmol/L (ref 98–111)
Creatinine, Ser: 0.75 mg/dL (ref 0.44–1.00)
GFR, Estimated: >60 mL/min (ref >60)
Glucose, Bld: 94 mg/dL (ref 70–99)
Potassium: 3.7 mmol/L (ref 3.5–5.1)
Sodium: 141 mmol/L (ref 135–145)
Total Bilirubin: 0.6 mg/dL (ref 0.0–1.2)
Total Protein: 7.5 g/dL (ref 6.5–8.1)

## 2024-03-27 LAB — LIPASE, BLOOD: Lipase: 26 U/L (ref 11–51)

## 2024-03-27 LAB — TROPONIN T, HIGH SENSITIVITY: Troponin T High Sensitivity: <15 ng/L (ref <19)

## 2024-03-27 MED ORDER — SODIUM CHLORIDE 0.9 % IV SOLN
1.0000 g | Freq: Once | INTRAVENOUS | Status: AC
  Administered 2024-03-27: 1 g via INTRAVENOUS
  Filled 2024-03-27: qty 10

## 2024-03-27 MED ORDER — MORPHINE SULFATE (PF) 4 MG/ML IV SOLN
4.0000 mg | Freq: Once | INTRAVENOUS | Status: AC
  Administered 2024-03-27: 4 mg via INTRAVENOUS
  Filled 2024-03-27: qty 1

## 2024-03-27 MED ORDER — CEPHALEXIN 500 MG PO CAPS: 500.0000 mg | ORAL_CAPSULE | Freq: Two times a day (BID) | ORAL | 0 refills | Status: DC

## 2024-03-27 MED ORDER — ONDANSETRON HCL 4 MG/2ML IJ SOLN
4.0000 mg | Freq: Once | INTRAMUSCULAR | Status: AC
  Administered 2024-03-27: 4 mg via INTRAVENOUS
  Filled 2024-03-27: qty 2

## 2024-03-27 NOTE — ED Triage Notes (Signed)
 Pt POV reporting persistent upper abd pain, hx issues with gallbladder, cholecystectomy scheduled beginning of June cancelled due to increased inflammation per pt, feels pain is due to this. Endorses nausea, denies vomiting.

## 2024-03-27 NOTE — Discharge Instructions (Addendum)
 Please follow-up with your general surgeon or cardiologist.  Seek emergency care if experiencing any new or worsening symptoms.

## 2024-03-27 NOTE — ED Provider Notes (Signed)
 [place] EMERGENCY DEPARTMENT AT [facility] Provider Note   CSN: [id number] Arrival date & time: 03/27/24  1552     Patient presents with: Abdominal Pain   Melissa Guzman is a 28 y.o. 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 Patient stating that she has been suffering from intermittent episodes of RUQ/LUQ pain and nausea and vomiting since January. Patient stating that it is d/t her gallbladder and she is working with her cardiologist outpatient to become medically cleared for a cholecystectomy. Patient noting that she is currently having some RUQ pain and also LUQ abdominal pain intermittently and also vomited this morning. Patient stating that she took Zofran  and Phenergan  this morning after vomiting and patient is currently eating mac and cheese during my initial interview. These symptoms are chronic and patient stating that she is coming in  again today for another re-evaluation of her gallbladder because she it tired of these episodes.  Patient also endorsing urinary frequency recently.  Of note, patient stating that she has been having chest pains every night when she goes to sleep for a while. Denies these symptoms currently. States that she has SOB when her gallbladder pain become severe but currently denies this symptom.  Last episode of chest pain was >12 hours ago when patient was going to bed last night.  Denies fever, cough, hematemesis, hematochezia.    Abdominal Pain      Prior to Admission medications   Medication Sig Start Date End Date Taking? Authorizing Provider  acetaminophen  (TYLENOL ) 500 MG tablet Take 2 tablets (1,000 mg total) by mouth every 8 (eight) hours as needed (pain). 03/05/22   Clem Tawni HERO, MD  cephALEXin  (KEFLEX ) 500 MG capsule Take 1 capsule (500 mg total) by mouth 2 (two) times daily. 03/27/24   Hoy Nidia FALCON,  PA-C  LORazepam  (ATIVAN ) 1 MG tablet Take 1 tablet (1 mg total) by mouth 3 (three) times daily as needed (nausea and vomiting). 02/27/24   Ula Prentice SAUNDERS, MD  losartan  (COZAAR ) 25 MG tablet Take 1 tablet (25 mg total) by mouth daily. 02/19/24   Elicia Hamlet, MD  magnesium  oxide (MAG-OX) 400 MG tablet Take 1 tablet (400 mg total) by mouth daily. 02/18/24   Elicia Hamlet, MD  metoprolol  succinate (TOPROL -XL) 25 MG 24 hr tablet Take 1 tablet (25 mg total) by mouth daily. 02/19/24   Elicia Hamlet, MD  nitroGLYCERIN  (NITROSTAT ) 0.4 MG SL tablet Place 1 tablet (0.4 mg total) under the tongue every 5 (five) minutes as needed for chest pain. 02/18/24   Elicia Hamlet, MD  pantoprazole  (PROTONIX ) 40 MG tablet Take 1 tablet (40 mg total) by mouth daily. 11/18/23   Jillian Buttery, MD    Allergies: Patient has no known allergies.    Review of Systems  Gastrointestinal:  Positive for abdominal pain.    Updated Vital Signs BP (!) 152/110   Pulse 65   Temp 99 F (37.2 C) (Oral)   Resp 16   Ht 5' (1.524 m)   Wt 53.1 kg   LMP 02/22/2024 (Approximate)   SpO2 99%   BMI 22.85 kg/m   Physical Exam Vitals and nursing note reviewed.  Constitutional:      General: She is not in acute distress.    Appearance: She is not ill-appearing or toxic-appearing.  HENT:     Head: Normocephalic and atraumatic.     Mouth/Throat:  Mouth: Mucous membranes are moist.     Pharynx: No posterior oropharyngeal erythema.   Eyes:     General: No scleral icterus.       Right eye: No discharge.        Left eye: No discharge.     Conjunctiva/sclera: Conjunctivae normal.    Cardiovascular:     Rate and Rhythm: Normal rate and regular rhythm.     Pulses: Normal pulses.     Heart sounds: Normal heart sounds. No murmur heard. Pulmonary:     Effort: Pulmonary effort is normal. No respiratory distress.     Breath sounds: Normal breath sounds. No wheezing, rhonchi or rales.  Abdominal:     General: Abdomen is flat. Bowel  sounds are normal. There is no distension.     Palpations: Abdomen is soft. There is no mass.     Tenderness: There is abdominal tenderness in the right upper quadrant and left upper quadrant.   Musculoskeletal:     Right lower leg: No edema.     Left lower leg: No edema.   Skin:    General: Skin is warm and dry.     Findings: No rash.   Neurological:     General: No focal deficit present.     Mental Status: She is alert and oriented to person, place, and time. Mental status is at baseline.   Psychiatric:        Mood and Affect: Mood normal.        Behavior: Behavior normal.     (all labs ordered are listed, but only abnormal results are displayed) Labs Reviewed  COMPREHENSIVE METABOLIC PANEL WITH GFR - Abnormal; Notable for the following components:      Result Value   BUN <5 (*)    AST 109 (*)    All other components within normal limits  CBC - Abnormal; Notable for the following components:   RBC 3.28 (*)    Hemoglobin 10.6 (*)    HCT 30.8 (*)    All other components within normal limits  URINALYSIS, ROUTINE W REFLEX MICROSCOPIC - Abnormal; Notable for the following components:   APPearance HAZY (*)    Specific Gravity, Urine 1.034 (*)    Hgb urine dipstick TRACE (*)    Ketones, ur TRACE (*)    Protein, ur >300 (*)    Leukocytes,Ua MODERATE (*)    Bacteria, UA RARE (*)    All other components within normal limits  LIPASE, BLOOD  PREGNANCY, URINE  TROPONIN T, HIGH SENSITIVITY    EKG: None  Radiology: US  Abdomen Limited RUQ (LIVER/GB) Result Date: 03/27/2024 CLINICAL DATA:  151471 RUQ pain 151471 with nausea, vomiting, and diarrhea. EXAM: ULTRASOUND ABDOMEN LIMITED RIGHT UPPER QUADRANT COMPARISON:  Feb 27, 2024, Feb 11, 2024 FINDINGS: Gallbladder: Small volume layering biliary sludge. No shadowing gallstones. No wall thickening or pericholecystic fluid. No sonographic Murphy's sign noted by sonographer. Small rounded echogenicities along the gallbladder fundus,  measuring 3 mm. Common bile duct: Diameter: 2 mm Liver: Enlarged with increased echogenicity. No focal lesion identified. No intrahepatic biliary ductal dilation. Portal vein is patent on color Doppler imaging with normal direction of blood flow towards the liver. Other: None. IMPRESSION: 1. Small volume layering biliary sludge. No cholecystolithiasis or changes of acute cholecystitis. 2. Small rounded echogenicities along the gallbladder fundus, measuring 3 mm, possibly adherent biliary sludge versus small polyps or changes of adenomyomatosis. Follow-up could be considered, as documented below. 3. Hepatomegaly with diffuse hepatic steatosis. Polyp  measuring equal to or less than 0.5 cm: -No risk factors for gallbladder malignancy: No follow-up required. -Risk factors for gallbladder malignancy: Follow-up ultrasound at 6 months, then annually for 2 years. Follow-up should be discontinued after 2 years, in the absence of growth. RECOMMENDATIONS: As per the 2021 joint European Society guidelines, suggested management of gallbladder polyp(s) are as follows: *NB: Risk factors for gallbladder malignancy include: > 36 years old, history of underlying primary sclerosing cholangitis, Asian ethnicity, sessile polyp (including focal wall thickening > 0.4 cm). Electronically Signed   By: Rogelia Myers M.D.   On: 03/27/2024 18:10   DG Chest Port 1 View Result Date: 03/27/2024 CLINICAL DATA:  855384 Pain 144615 EXAM: PORTABLE CHEST - 1 VIEW COMPARISON:  Feb 27, 2024 FINDINGS: No focal airspace consolidation, pleural effusion, or pneumothorax. No cardiomegaly.No acute fracture or destructive lesion. IMPRESSION: No acute cardiopulmonary abnormality. Electronically Signed   By: Rogelia Myers M.D.   On: 03/27/2024 18:00     Procedures   Medications Ordered in the ED  cefTRIAXone  (ROCEPHIN ) 1 g in sodium chloride  0.9 % 100 mL IVPB (has no administration in time range)  morphine  (PF) 4 MG/ML injection 4 mg (4 mg  Intravenous Given 03/27/24 1806)  ondansetron  (ZOFRAN ) injection 4 mg (4 mg Intravenous Given 03/27/24 1803)                                    Medical Decision Making Amount and/or Complexity of Data Reviewed Labs: ordered. Radiology: ordered.  Risk Prescription drug management.    This patient presents to the ED for concern of abdominal pain, this involves an extensive number of treatment options, and is a complaint that carries with it a high risk of complications and morbidity.  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   Problem List / ED Course / Critical interventions / Medication management  Patient presented for abdominal pain.  Physical exam with RUQ and LUQ tenderness to palpation.  Rest of physical exam reassuring.  Patient afebrile with stable vitals.  Patient stating that the symptoms are constant for her flares that she has been having since January. I Ordered, and personally interpreted labs.  UA concerning for UTI with moderate leukocytes and rare bacteria.  CBC without leukocytosis.  There is anemia at patient's baseline with hemoglobin at 10.6 today.  CMP with slight elevation in AST at 109 today.  Lipase within normal limits.  UPT negative.  Troponin within normal limits. I ordered imaging studies including RUQ US  and chest x-Gabbert. I independently visualized and interpreted imaging and I agree with the radiologist interpretation of no acute process.  RUQ ultrasound continues to show gallbladder sludge.  No other signs of cholecystitis. I personally viewed and interpreted the EKG/cardiac  monitored which showed an underlying rhythm of: Sinus rhythm. Shared also with patient.  Answered all questions.  Patient was eating mac & cheese during my initial interview and her gallbladder workup today was reassuring so does not appear that she has an emergent process at this time requiring urgent surgical procedures.  I recommended patient follow-up with her cardiologist and her general surgeon.  Patient verbalized  understanding of plan. Patient provided with IV dose of Rocephin  in ED for UTI.  Sent rest of course of Keflex  to pharmacy. I have reviewed the patients home medicines and have made adjustments as needed The patient has been appropriately medically screened and/or stabilized in the ED. I have low suspicion for any other emergent medical condition which would require further screening, evaluation or treatment in the ED or require inpatient management. At time of discharge the patient is hemodynamically stable and in no acute distress. I have discussed work-up results and diagnosis with patient and answered all questions. Patient is agreeable with discharge plan. We discussed strict return precautions for returning to the emergency department and they verbalized understanding.     Social Determinants of Health:  none       Final diagnoses:  Sludge in gallbladder  Acute cystitis with hematuria    ED Discharge Orders          Ordered    cephALEXin  (KEFLEX ) 500 MG capsule  2 times daily,   Status:  Discontinued        03/27/24 1846    cephALEXin  (KEFLEX ) 500 MG capsule  2 times daily        03/27/24 1846               Hoy Nidia FALCON, [place] 03/27/24 1849    Patsey Lot, MD 03/27/24 2315

## 2024-03-28 ENCOUNTER — Other Ambulatory Visit: Payer: Self-pay

## 2024-03-28 ENCOUNTER — Emergency Department (HOSPITAL_COMMUNITY): Admission: EM | Admit: 2024-03-28 | Discharge: 2024-03-29 | Disposition: A | Discharge status: Home / Self Care

## 2024-03-28 ENCOUNTER — Encounter (HOSPITAL_COMMUNITY): Payer: Self-pay

## 2024-03-28 DIAGNOSIS — R9431 Abnormal electrocardiogram [ECG] [EKG]: Secondary | ICD-10-CM

## 2024-03-28 DIAGNOSIS — I4581 Long QT syndrome: Secondary | ICD-10-CM | POA: Insufficient documentation

## 2024-03-28 DIAGNOSIS — R809 Proteinuria, unspecified: Secondary | ICD-10-CM | POA: Insufficient documentation

## 2024-03-28 DIAGNOSIS — E876 Hypokalemia: Secondary | ICD-10-CM | POA: Insufficient documentation

## 2024-03-28 DIAGNOSIS — Z79899 Other long term (current) drug therapy: Secondary | ICD-10-CM | POA: Diagnosis not present

## 2024-03-28 DIAGNOSIS — I110 Hypertensive heart disease with heart failure: Secondary | ICD-10-CM | POA: Insufficient documentation

## 2024-03-28 DIAGNOSIS — R1013 Epigastric pain: Secondary | ICD-10-CM | POA: Diagnosis present

## 2024-03-28 DIAGNOSIS — I5020 Unspecified systolic (congestive) heart failure: Secondary | ICD-10-CM | POA: Insufficient documentation

## 2024-03-28 LAB — CBC WITH DIFFERENTIAL/PLATELET
Abs Immature Granulocytes: 0.02 10*3/uL (ref 0.00–0.07)
Basophils Absolute: 0 10*3/uL (ref 0.0–0.1)
Basophils Relative: 1 %
Eosinophils Absolute: 0 10*3/uL (ref 0.0–0.5)
Eosinophils Relative: 1 %
HCT: 31 % — ABNORMAL LOW (ref 36.0–46.0)
Hemoglobin: 10.3 g/dL — ABNORMAL LOW (ref 12.0–15.0)
Immature Granulocytes: 0 %
Lymphocytes Relative: 33 %
Lymphs Abs: 1.8 10*3/uL (ref 0.7–4.0)
MCH: 32.4 pg (ref 26.0–34.0)
MCHC: 33.2 g/dL (ref 30.0–36.0)
MCV: 97.5 fL (ref 80.0–100.0)
Monocytes Absolute: 0.3 10*3/uL (ref 0.1–1.0)
Monocytes Relative: 6 %
Neutro Abs: 3.3 10*3/uL (ref 1.7–7.7)
Neutrophils Relative %: 59 %
Platelets: 169 10*3/uL (ref 150–400)
RBC: 3.18 MIL/uL — ABNORMAL LOW (ref 3.87–5.11)
RDW: 13.3 % (ref 11.5–15.5)
WBC: 5.4 10*3/uL (ref 4.0–10.5)
nRBC: 0 % (ref 0.0–0.2)

## 2024-03-28 LAB — URINALYSIS, ROUTINE W REFLEX MICROSCOPIC
Bacteria, UA: NONE SEEN
Glucose, UA: NEGATIVE mg/dL
Hgb urine dipstick: NEGATIVE
Ketones, ur: 20 mg/dL — AB
Leukocytes,Ua: NEGATIVE
Nitrite: NEGATIVE
Protein, ur: >=300 mg/dL — AB
Specific Gravity, Urine: 1.029 (ref 1.005–1.030)
pH: 6 (ref 5.0–8.0)

## 2024-03-28 LAB — COMPREHENSIVE METABOLIC PANEL WITH GFR
ALT: 21 U/L (ref 0–44)
AST: 125 U/L — ABNORMAL HIGH (ref 15–41)
Albumin: 4 g/dL (ref 3.5–5.0)
Alkaline Phosphatase: 93 U/L (ref 38–126)
Anion gap: 14 (ref 5–15)
BUN: 5 mg/dL — ABNORMAL LOW (ref 6–20)
CO2: 28 mmol/L (ref 22–32)
Calcium: 8.9 mg/dL (ref 8.9–10.3)
Chloride: 98 mmol/L (ref 98–111)
Creatinine, Ser: 0.77 mg/dL (ref 0.44–1.00)
GFR, Estimated: >60 mL/min (ref >60)
Glucose, Bld: 102 mg/dL — ABNORMAL HIGH (ref 70–99)
Potassium: 2.9 mmol/L — ABNORMAL LOW (ref 3.5–5.1)
Sodium: 140 mmol/L (ref 135–145)
Total Bilirubin: 1.5 mg/dL — ABNORMAL HIGH (ref 0.0–1.2)
Total Protein: 7.4 g/dL (ref 6.5–8.1)

## 2024-03-28 LAB — MAGNESIUM: Magnesium: 1.3 mg/dL — ABNORMAL LOW (ref 1.7–2.4)

## 2024-03-28 MED ORDER — HYDROMORPHONE HCL 1 MG/ML IJ SOLN
0.5000 mg | Freq: Once | INTRAMUSCULAR | Status: AC
  Administered 2024-03-28: 0.5 mg via INTRAVENOUS
  Filled 2024-03-28: qty 1

## 2024-03-28 MED ORDER — POTASSIUM CHLORIDE 10 MEQ/100ML IV SOLN
10.0000 meq | INTRAVENOUS | Status: AC
  Administered 2024-03-28 (×2): 10 meq via INTRAVENOUS
  Filled 2024-03-28 (×2): qty 100

## 2024-03-28 MED ORDER — SODIUM CHLORIDE 0.9 % IV BOLUS
1000.0000 mL | Freq: Once | INTRAVENOUS | Status: AC
Start: 2024-03-28 — End: 2024-03-29
  Administered 2024-03-28: 1000 mL via INTRAVENOUS

## 2024-03-28 MED ORDER — POTASSIUM CHLORIDE CRYS ER 20 MEQ PO TBCR
40.0000 meq | EXTENDED_RELEASE_TABLET | Freq: Once | ORAL | Status: AC
  Administered 2024-03-28: 40 meq via ORAL
  Filled 2024-03-28: qty 2

## 2024-03-28 MED ORDER — ONDANSETRON HCL 4 MG/2ML IJ SOLN
4.0000 mg | Freq: Once | INTRAMUSCULAR | Status: AC
  Administered 2024-03-28: 4 mg via INTRAVENOUS
  Filled 2024-03-28: qty 2

## 2024-03-28 MED ORDER — FAMOTIDINE IN NACL 20-0.9 MG/50ML-% IV SOLN
20.0000 mg | Freq: Once | INTRAVENOUS | Status: AC
  Administered 2024-03-28: 20 mg via INTRAVENOUS
  Filled 2024-03-28: qty 50

## 2024-03-28 MED ORDER — MAGNESIUM SULFATE 2 GM/50ML IV SOLN
2.0000 g | INTRAVENOUS | Status: AC
Start: 2024-03-28 — End: 2024-03-29
  Administered 2024-03-29: 2 g via INTRAVENOUS
  Filled 2024-03-28: qty 50

## 2024-03-28 MED ORDER — POTASSIUM CHLORIDE CRYS ER 20 MEQ PO TBCR: 20.0000 meq | EXTENDED_RELEASE_TABLET | Freq: Every day | ORAL | 0 refills | Status: DC

## 2024-03-28 NOTE — ED Triage Notes (Signed)
 Pov/ seen at [facility] ED for same issue yesterday/ dx with UTI- per pt/ pt states she is feeling worse/ pt unable to keep anything down/ pt has not started taking meds sent to pharmacy/ pt is A&OX4/ ambulatory

## 2024-03-28 NOTE — Discharge Instructions (Addendum)
 1)Please pick up the medications that you were prescribed yesterday! 2) Your potassium and magnesium  levels were low, I have given you some here but you will need to take more medicine and I have prescribed this for you to pick up.  3) it would be helpful to take a complete multivitiamin 4) Your EKG was ok except for a long QT interval- This is most likely because of your low magnesium  and potassium level. Have you EKG rechecked by your primary care doctor, Contact a health care provider if: Your pain changes, gets worse, or lasts longer than expected. You have severe cramping or bloating in your abdomen, or you vomit. Your pain gets worse with meals, after eating, or with certain foods. You are constipated or have diarrhea for more than 2-3 days. You are not hungry, or you lose weight without trying. You have signs of dehydration. These may include: Dark pee, very little pee, or no pee. Cracked lips or dry mouth. Sleepiness or weakness. You have pain when you pee (urinate) or poop. Your abdominal pain wakes you up at night. You have blood in your pee. You have a fever. Get help right away if: You cannot stop vomiting. Your pain is only in one part of the abdomen. Pain on the right side could be caused by appendicitis. You have bloody or black poop (stool), or poop that looks like tar. You have trouble breathing. You have chest pain. These symptoms may be an emergency. Get help right away. Call 911. Do not wait to see if the symptoms will go away. Do not drive yourself to the hospital.

## 2024-03-28 NOTE — ED Provider Notes (Signed)
  EMERGENCY DEPARTMENT AT [facility] Provider Note   CSN: [id number] Arrival date & time: 03/28/24  8261     Patient presents with: Abdominal Pain   Melissa Guzman is a 28 y.o. female with a past medical history of Takotsubo cardiomyopathy with EF of 20 to 25%, hypertension, anemia gallbladder sludge, and chronic recurring right upper quadrant abdominal pain.  She presents to the emergency department with epigastric abdominal pain which she describes as constant, intermittent, coming and going.  She was seen yesterday for similar symptoms had pain control and nausea medications and felt better.  Patient reports that she was supposed to have her gallbladder removed but because of her Takotsubo's cardiomyopathy she has to be cleared by cardiology before she can have surgery.  She states that eating seems to make her pain worse.  Nothing seems to make it better.  She did not pick up the medications that she was supposed to take from her visit to the emergency department yesterday because she had to work all day and her pain became so severe she came straight here.  She has nausea without vomiting.  Review of previous records shows that the patient has had multiple studies including HIDA scans, right upper quadrant abdominal ultrasound, and CT scan.  She had ultrasound and CT scan done yesterday without acute finding.  {Add pertinent medical, surgical, social history, OB history to HPI:32947}  Abdominal Pain      Prior to Admission medications   Medication Sig Start Date End Date Taking? Authorizing Provider  acetaminophen  (TYLENOL ) 500 MG tablet Take 2 tablets (1,000 mg total) by mouth every 8 (eight) hours as needed (pain). 03/05/22   Clem Tawni HERO, MD  cephALEXin  (KEFLEX ) 500 MG capsule Take 1 capsule (500 mg total) by mouth 2 (two) times daily. 03/27/24   Hoy Nidia FALCON, PA-C  LORazepam  (ATIVAN ) 1 MG tablet Take 1 tablet (1 mg total) by mouth 3 (three)  times daily as needed (nausea and vomiting). 02/27/24   Ula Prentice SAUNDERS, MD  losartan  (COZAAR ) 25 MG tablet Take 1 tablet (25 mg total) by mouth daily. 02/19/24   Elicia Hamlet, MD  magnesium  oxide (MAG-OX) 400 MG tablet Take 1 tablet (400 mg total) by mouth daily. 02/18/24   Elicia Hamlet, MD  metoprolol  succinate (TOPROL -XL) 25 MG 24 hr tablet Take 1 tablet (25 mg total) by mouth daily. 02/19/24   Elicia Hamlet, MD  nitroGLYCERIN  (NITROSTAT ) 0.4 MG SL tablet Place 1 tablet (0.4 mg total) under the tongue every 5 (five) minutes as needed for chest pain. 02/18/24   Elicia Hamlet, MD  pantoprazole  (PROTONIX ) 40 MG tablet Take 1 tablet (40 mg total) by mouth daily. 11/18/23   Jillian Buttery, MD    Allergies: Patient has no known allergies.    Review of Systems  Gastrointestinal:  Positive for abdominal pain.    Updated Vital Signs BP (!) 155/105   Pulse 82   Temp 99.4 F (37.4 C) (Oral)   Resp 16   Wt 53 kg   LMP 03/14/2024 (Approximate)   SpO2 100%   BMI 22.82 kg/m   Physical Exam Vitals and nursing note reviewed.  Constitutional:      General: She is not in acute distress.    Appearance: She is well-developed. She is not diaphoretic.  HENT:     Head: Normocephalic and atraumatic.     Right Ear: External ear normal.     Left Ear: External ear normal.  Nose: Nose normal.     Mouth/Throat:     Mouth: Mucous membranes are moist.   Eyes:     General: No scleral icterus.    Conjunctiva/sclera: Conjunctivae normal.    Cardiovascular:     Rate and Rhythm: Normal rate and regular rhythm.     Heart sounds: Normal heart sounds. No murmur heard.    No friction rub. No gallop.  Pulmonary:     Effort: Pulmonary effort is normal. No respiratory distress.     Breath sounds: Normal breath sounds.  Abdominal:     General: Bowel sounds are normal. There is no distension.     Palpations: Abdomen is soft. There is no mass.     Tenderness: There is abdominal tenderness in the right upper  quadrant, epigastric area and left upper quadrant. There is no guarding.   Musculoskeletal:     Cervical back: Normal range of motion.   Skin:    General: Skin is warm and dry.   Neurological:     Mental Status: She is alert and oriented to person, place, and time.   Psychiatric:        Behavior: Behavior normal.     (all labs ordered are listed, but only abnormal results are displayed) Labs Reviewed  CBC WITH DIFFERENTIAL/PLATELET - Abnormal; Notable for the following components:      Result Value   RBC 3.18 (*)    Hemoglobin 10.3 (*)    HCT 31.0 (*)    All other components within normal limits  COMPREHENSIVE METABOLIC PANEL WITH GFR - Abnormal; Notable for the following components:   Potassium 2.9 (*)    Glucose, Bld 102 (*)    BUN 5 (*)    AST 125 (*)    Total Bilirubin 1.5 (*)    All other components within normal limits  URINALYSIS, ROUTINE W REFLEX MICROSCOPIC  MAGNESIUM     EKG: None  Radiology: US  Abdomen Limited RUQ (LIVER/GB) Result Date: 03/27/2024 CLINICAL DATA:  151471 RUQ pain 151471 with nausea, vomiting, and diarrhea. EXAM: ULTRASOUND ABDOMEN LIMITED RIGHT UPPER QUADRANT COMPARISON:  Feb 27, 2024, Feb 11, 2024 FINDINGS: Gallbladder: Small volume layering biliary sludge. No shadowing gallstones. No wall thickening or pericholecystic fluid. No sonographic Murphy's sign noted by sonographer. Small rounded echogenicities along the gallbladder fundus, measuring 3 mm. Common bile duct: Diameter: 2 mm Liver: Enlarged with increased echogenicity. No focal lesion identified. No intrahepatic biliary ductal dilation. Portal vein is patent on color Doppler imaging with normal direction of blood flow towards the liver. Other: None. IMPRESSION: 1. Small volume layering biliary sludge. No cholecystolithiasis or changes of acute cholecystitis. 2. Small rounded echogenicities along the gallbladder fundus, measuring 3 mm, possibly adherent biliary sludge versus small polyps or  changes of adenomyomatosis. Follow-up could be considered, as documented below. 3. Hepatomegaly with diffuse hepatic steatosis. Polyp measuring equal to or less than 0.5 cm: -No risk factors for gallbladder malignancy: No follow-up required. -Risk factors for gallbladder malignancy: Follow-up ultrasound at 6 months, then annually for 2 years. Follow-up should be discontinued after 2 years, in the absence of growth. RECOMMENDATIONS: As per the 2021 joint European Society guidelines, suggested management of gallbladder polyp(s) are as follows: *NB: Risk factors for gallbladder malignancy include: > 70 years old, history of underlying primary sclerosing cholangitis, Asian ethnicity, sessile polyp (including focal wall thickening > 0.4 cm). Electronically Signed   By: Rogelia Myers M.D.   On: 03/27/2024 18:10   DG Chest [facility] 1 View Result  Date: 03/27/2024 CLINICAL DATA:  855384 Pain 144615 EXAM: PORTABLE CHEST - 1 VIEW COMPARISON:  Feb 27, 2024 FINDINGS: No focal airspace consolidation, pleural effusion, or pneumothorax. No cardiomegaly.No acute fracture or destructive lesion. IMPRESSION: No acute cardiopulmonary abnormality. Electronically Signed   By: Rogelia Myers M.D.   On: 03/27/2024 18:00    {Document cardiac monitor, telemetry assessment procedure when appropriate:32947} Procedures   Medications Ordered in the ED  sodium chloride  0.9 % bolus 1,000 mL (has no administration in time range)  HYDROmorphone  (DILAUDID ) injection 0.5 mg (has no administration in time range)  ondansetron  (ZOFRAN ) injection 4 mg (has no administration in time range)  famotidine  (PEPCID ) IVPB 20 mg premix (has no administration in time range)  potassium chloride  10 mEq in 100 mL IVPB (has no administration in time range)  potassium chloride  SA (KLOR-CON  M) CR tablet 40 mEq (has no administration in time range)    Clinical Course as of 03/28/24 2346  Mon Mar 28, 2024  2241 Protein(!): >=300 [AH]  2243 Potassium(!):  2.9 [AH]  2243 Hemoglobin(!): 10.3 Hemoglobin is baseline [AH]    Clinical Course User Index [AH] Arloa Chroman, PA-C   {Click here for ABCD2, HEART and other calculators REFRESH Note before signing:1}                              Medical Decision Making Amount and/or Complexity of Data Reviewed Labs: ordered. ECG/medicine tests: ordered.  Risk Prescription drug management.   ***  {Document critical care time when appropriate  Document review of labs and clinical decision tools ie CHADS2VASC2, etc  Document your independent review of radiology images and any outside records  Document your discussion with family members, caretakers and with consultants  Document social determinants of health affecting pt's care  Document your decision making why or why not admission, treatments were needed:32947:::1}   Final diagnoses:  None    ED Discharge Orders     None

## 2024-03-29 MED ORDER — ONDANSETRON HCL 4 MG PO TABS
4.0000 mg | ORAL_TABLET | Freq: Once | ORAL | Status: AC
  Administered 2024-03-29: 4 mg via ORAL
  Filled 2024-03-29: qty 1

## 2024-03-30 ENCOUNTER — Other Ambulatory Visit (HOSPITAL_COMMUNITY)

## 2024-04-07 ENCOUNTER — Ambulatory Visit (HOSPITAL_COMMUNITY)
Admission: RE | Admit: 2024-04-07 | Discharge: 2024-04-07 | Disposition: A | Discharge status: Home / Self Care | Source: Ambulatory Visit | Attending: Internal Medicine | Admitting: Internal Medicine

## 2024-04-07 DIAGNOSIS — I5181 Takotsubo syndrome: Secondary | ICD-10-CM | POA: Insufficient documentation

## 2024-04-07 LAB — ECHOCARDIOGRAM COMPLETE: S' Lateral: 3.55 cm

## 2024-04-08 ENCOUNTER — Ambulatory Visit: Payer: Self-pay | Admitting: Cardiology

## 2024-04-13 ENCOUNTER — Encounter: Payer: Self-pay | Admitting: *Deleted

## 2024-04-15 ENCOUNTER — Ambulatory Visit: Payer: Self-pay | Admitting: General Surgery

## 2024-04-15 NOTE — Patient Instructions (Addendum)
 SURGICAL WAITING ROOM VISITATION Patients having surgery or a procedure may have no more than 2 support people in the waiting area - these visitors may rotate.    Children under the age of 65 must have an adult with them who is not the patient.  If the patient needs to stay at the hospital during part of their recovery, the visitor guidelines for inpatient rooms apply. Pre-op nurse will coordinate an appropriate time for 1 support person to accompany patient in pre-op.  This support person may not rotate.    Please refer to the [facility] website for the visitor guidelines for Inpatients (after your surgery is over and you are in a regular room).       Your procedure is scheduled on: 04-25-24   Report to [facility] Main Entrance    Report to admitting at 10:45 AM   Call this number if you have problems the morning of surgery [phone]   Do not eat food :After Midnight.   After Midnight you may have the following liquids until 10:00 AM/ DAY OF SURGERY  Water  Non-Citrus Juices (without pulp, NO RED-Apple, White grape, White cranberry) Black Coffee (NO MILK/CREAM OR CREAMERS, sugar ok)  Clear Tea (NO MILK/CREAM OR CREAMERS, sugar ok) regular and decaf                             Plain Jell-O (NO RED)                                           Fruit ices (not with fruit pulp, NO RED)                                     Popsicles (NO RED)                                                               Sports drinks like Gatorade (NO RED)                       If you have questions, please contact your surgeon's office.   FOLLOW  ANY ADDITIONAL PRE OP INSTRUCTIONS YOU RECEIVED FROM YOUR SURGEON'S OFFICE!!!     Oral Hygiene is also important to reduce your risk of infection.                                    Remember - BRUSH YOUR TEETH THE MORNING OF SURGERY WITH YOUR REGULAR TOOTHPASTE   Do NOT smoke after Midnight   Take these medicines the morning of surgery with A  SIP OF WATER :    None  Stop all vitamins and herbal supplements 7 days before surgery                              You may not have any metal on your body including hair pins, jewelry, and body piercing  Do not wear make-up, lotions, powders, perfumes, or deodorant  Do not wear nail polish including gel and S&S, artificial/acrylic nails, or any other type of covering on natural nails including finger and toenails. If you have artificial nails, gel coating, etc. that needs to be removed by a nail salon please have this removed prior to surgery or surgery may need to be canceled/ delayed if the surgeon/ anesthesia feels like they are unable to be safely monitored.   Do not shave  48 hours prior to surgery.    Do not bring valuables to the hospital. [facility] IS NOT RESPONSIBLE   FOR VALUABLES.   Contacts, dentures or bridgework may not be worn into surgery.  DO NOT BRING YOUR HOME MEDICATIONS TO THE HOSPITAL. PHARMACY WILL DISPENSE MEDICATIONS LISTED ON YOUR MEDICATION LIST TO YOU DURING YOUR ADMISSION IN THE HOSPITAL!    Patients discharged on the day of surgery will not be allowed to drive home.  Someone NEEDS to stay with you for the first 24 hours after anesthesia.   Special Instructions: Bring a copy of your healthcare power of attorney and living will documents the day of surgery if you haven't scanned them before.              Please read over the following fact sheets you were given: IF YOU HAVE QUESTIONS ABOUT YOUR PRE-OP INSTRUCTIONS PLEASE CALL [phone] [place]  If you received a COVID test during your pre-op visit  it is requested that you wear a mask when out in public, stay away from anyone that may not be feeling well and notify your surgeon if you develop symptoms. If you test positive for Covid or have been in contact with anyone that has tested positive in the last 10 days please notify you surgeon.  [facility] - Preparing for Surgery Before surgery, you  can play an important role.  Because skin is not sterile, your skin needs to be as free of germs as possible.  You can reduce the number of germs on your skin by washing with CHG (chlorahexidine gluconate) soap before surgery.  CHG is an antiseptic cleaner which kills germs and bonds with the skin to continue killing germs even after washing. Please DO NOT use if you have an allergy to CHG or antibacterial soaps.  If your skin becomes reddened/irritated stop using the CHG and inform your nurse when you arrive at Short Stay. Do not shave (including legs and underarms) for at least 48 hours prior to the first CHG shower.  You may shave your face/neck.  Please follow these instructions carefully:  1.  Shower with CHG Soap the night before surgery and the  morning of surgery.  2.  If you choose to wash your hair, wash your hair first as usual with your normal  shampoo.  3.  After you shampoo, rinse your hair and body thoroughly to remove the shampoo.                             4.  Use CHG as you would any other liquid soap.  You can apply chg directly to the skin and wash.  Gently with a scrungie or clean washcloth.  5.  Apply the CHG Soap to your body ONLY FROM THE NECK DOWN.   Do   not use on face/ open  Wound or open sores. Avoid contact with eyes, ears mouth and   genitals (private parts).                       Wash face,  Genitals (private parts) with your normal soap.             6.  Wash thoroughly, paying special attention to the area where your    surgery  will be performed.  7.  Thoroughly rinse your body with warm water  from the neck down.  8.  DO NOT shower/wash with your normal soap after using and rinsing off the CHG Soap.                9.  Pat yourself dry with a clean towel.            10.  Wear clean pajamas.            11.  Place clean sheets on your bed the night of your first shower and do not  sleep with pets. Day of Surgery : Do not apply any  lotions/deodorants the morning of surgery.  Please wear clean clothes to the hospital/surgery center.  FAILURE TO FOLLOW THESE INSTRUCTIONS MAY RESULT IN THE CANCELLATION OF YOUR SURGERY  PATIENT SIGNATURE_________________________________  NURSE SIGNATURE__________________________________  ________________________________________________________________________

## 2024-04-15 NOTE — Progress Notes (Signed)
 Surgery orders requested via Epic inbox.

## 2024-04-19 NOTE — Progress Notes (Signed)
 COVID Vaccine Completed:  Date of COVID positive in last 90 days:  PCP - [facility] Medical Cardiologist - Medford Nanas, MD  Chest x-[place] - 03-27-24 Epic EKG - 03-28-24 Epic Stress Test -  ECHO - 04-07-24 Epic Cardiac Cath -  Pacemaker/ICD device last checked: Spinal Cord Stimulator: MR cardiac - 02-15-24 Epic  Bowel Prep -   Sleep Study -  CPAP -   Fasting Blood Sugar -  Checks Blood Sugar _____ times a day  Last dose of GLP1 agonist-  N/A GLP1 instructions:  Do not take after     Last dose of SGLT-2 inhibitors-  N/A SGLT-2 instructions:  Do not take after     Blood Thinner Instructions:  Last dose:   Time: Aspirin  Instructions: Last Dose:  Activity level:  Can go up a flight of stairs and perform activities of daily living without stopping and without symptoms of chest pain or shortness of breath.  Able to exercise without symptoms  Unable to go up a flight of stairs without symptoms of     Anesthesia review: Stress induced cardiomyopathy, LV thrombus, CHF  Patient denies shortness of breath, fever, cough and chest pain at PAT appointment  Patient verbalized understanding of instructions that were given to them at the PAT appointment. Patient was also instructed that they will need to review over the PAT instructions again at home before surgery.

## 2024-04-21 ENCOUNTER — Encounter (HOSPITAL_COMMUNITY): Payer: Self-pay

## 2024-04-21 ENCOUNTER — Other Ambulatory Visit: Payer: Self-pay

## 2024-04-21 ENCOUNTER — Encounter (HOSPITAL_COMMUNITY)
Admission: RE | Admit: 2024-04-21 | Discharge: 2024-04-21 | Disposition: A | Discharge status: Home / Self Care | Source: Ambulatory Visit | Attending: General Surgery | Admitting: General Surgery

## 2024-04-21 VITALS — BP 148/107 | HR 73 | Temp 98.4°F | Resp 16 | Ht 61.0 in | Wt 114.4 lb

## 2024-04-21 DIAGNOSIS — K769 Liver disease, unspecified: Secondary | ICD-10-CM | POA: Insufficient documentation

## 2024-04-21 DIAGNOSIS — Z01818 Encounter for other preprocedural examination: Secondary | ICD-10-CM | POA: Diagnosis present

## 2024-04-21 DIAGNOSIS — Z01812 Encounter for preprocedural laboratory examination: Secondary | ICD-10-CM | POA: Diagnosis not present

## 2024-04-21 DIAGNOSIS — D649 Anemia, unspecified: Secondary | ICD-10-CM | POA: Diagnosis not present

## 2024-04-21 HISTORY — DX: Takotsubo syndrome: I51.81

## 2024-04-21 HISTORY — DX: Acute pancreatitis without necrosis or infection, unspecified: K85.90

## 2024-04-21 LAB — COMPREHENSIVE METABOLIC PANEL WITH GFR
ALT: 34 U/L (ref 0–44)
AST: 133 U/L — ABNORMAL HIGH (ref 15–41)
Albumin: 3.9 g/dL (ref 3.5–5.0)
Alkaline Phosphatase: 88 U/L (ref 38–126)
Anion gap: 10 (ref 5–15)
BUN: 6 mg/dL (ref 6–20)
CO2: 25 mmol/L (ref 22–32)
Calcium: 9.1 mg/dL (ref 8.9–10.3)
Chloride: 104 mmol/L (ref 98–111)
Creatinine, Ser: 0.54 mg/dL (ref 0.44–1.00)
GFR, Estimated: >60 mL/min (ref >60)
Glucose, Bld: 100 mg/dL — ABNORMAL HIGH (ref 70–99)
Potassium: 3.4 mmol/L — ABNORMAL LOW (ref 3.5–5.1)
Sodium: 139 mmol/L (ref 135–145)
Total Bilirubin: 0.8 mg/dL (ref 0.0–1.2)
Total Protein: 7.6 g/dL (ref 6.5–8.1)

## 2024-04-21 LAB — CBC
HCT: 36 % (ref 36.0–46.0)
Hemoglobin: 11.4 g/dL — ABNORMAL LOW (ref 12.0–15.0)
MCH: 30.9 pg (ref 26.0–34.0)
MCHC: 31.7 g/dL (ref 30.0–36.0)
MCV: 97.6 fL (ref 80.0–100.0)
Platelets: 234 K/uL (ref 150–400)
RBC: 3.69 MIL/uL — ABNORMAL LOW (ref 3.87–5.11)
RDW: 13.7 % (ref 11.5–15.5)
WBC: 6.1 K/uL (ref 4.0–10.5)
nRBC: 0 % (ref 0.0–0.2)

## 2024-04-22 NOTE — Progress Notes (Incomplete)
 Anesthesia Chart Review   Case: [id number] Date/Time: 04/25/24 1245   Procedure: LAPAROSCOPIC CHOLECYSTECTOMY   Anesthesia type: General   Diagnosis: Gallstone pancreatitis [K85.10]   Pre-op diagnosis: GALLSTONE PANCREATITIS   Location: [facility] ROOM 01 / [facility]   Surgeons: Polly Cordella LABOR, MD       DISCUSSION:28 y.o. never smoker with h/o HTN, gallstone pancreatitis scheduled for above procedure 04/25/2024 with Dr. Cordella Polly.   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  Echo during admission with EF 20-25%. Started on Entresto  during admission, but did not tolerate due to low BP.  She was switched to Losartan  daily.  Remains on Toprol  XL 25 mg daily.  Echo 04/07/2024 with improved EF of 45-50%. Per results comments, Given improvement in heart pumping, okay from cardiac standpoint to proceed with cholecystectomy 3 month follow up with cardiology recommended.   Elevated PT at PAT visit. Pt reports she has been out of Toprol  and Losartan  for about 1 week. Advised by PAT nurse to pick up prescriptions and resume prior to surgery. Evaluate BP DOS.  VS: BP (!) 148/107   Pulse 73   Temp 36.9 C (Oral)   Resp 16   Ht 5' 1 (1.549 m)   Wt 51.9 kg   LMP 04/18/2024 (Exact Date)   SpO2 100%   BMI 21.62 kg/m   PROVIDERS: Austin Mutton, MD is PCP   Cardiologist - Medford Nanas, MD   LABS: Labs reviewed: Acceptable for surgery. (all labs ordered are listed, but only abnormal results are displayed)  Labs Reviewed  COMPREHENSIVE METABOLIC PANEL WITH GFR - Abnormal; Notable for the following components:      Result Value   Potassium 3.4 (*)    Glucose, Bld 100 (*)    AST 133 (*)    All other components within normal limits  CBC - Abnormal; Notable for the following components:   RBC 3.69 (*)    Hemoglobin 11.4 (*)    All other components within normal limits     IMAGES:   EKG:   CV: Echo 04/07/2024  1. Left ventricular ejection  fraction, by estimation, is 45 to 50%. Left  ventricular ejection fraction by 3D volume is 53 %. The left ventricle has  mildly decreased function. The left ventricle has no regional wall motion  abnormalities. Left ventricular   diastolic parameters were normal. The average left ventricular global  longitudinal strain is -19.2 %. The global longitudinal strain is normal.   2. Right ventricular systolic function is normal. The right ventricular  size is normal.   3. The mitral valve is normal in structure. Mild mitral valve  regurgitation.   4. The aortic valve is normal in structure. Aortic valve regurgitation is  trivial.   Past Medical History:  Diagnosis Date   Acute blood loss anemia 03/01/2022   Anemia    Anxiety    COVID-19 affecting pregnancy in third trimester 02/04/2022   +01/28/2022   Endometritis 03/05/2022   Hypertension    Pancreatitis    Postpartum septic endometritis 03/09/2022   Stress-induced cardiomyopathy    UTI (urinary tract infection) 05/26/2023    Past Surgical History:  Procedure Laterality Date   CESAREAN SECTION N/A 03/01/2022   Procedure: CESAREAN SECTION;  Surgeon: Lorence Ozell CROME, MD;  Location: [facility] LD ORS;  Service: Obstetrics;  Laterality: N/A;   DIAGNOSTIC LAPAROSCOPIC LIVER BIOPSY N/A 10/09/2023   Procedure: DIAGNOSTIC LAPAROSCOPY;  Surgeon: Polly Cordella LABOR, MD;  Location: [facility] OR;  Service: General;  Laterality: N/A;   IR RADIOLOGIST EVAL & MGMT  03/28/2022   IR RADIOLOGIST EVAL & MGMT  04/10/2022    MEDICATIONS:  amLODipine  (NORVASC ) 5 MG tablet   acetaminophen  (TYLENOL ) 500 MG tablet   ibuprofen  (ADVIL ) 200 MG tablet   No current facility-administered medications for this encounter.    Harlene Hoots Ward, PA-C [facility] Pre-Surgical Testing [phone]

## 2024-04-25 ENCOUNTER — Encounter (HOSPITAL_COMMUNITY)
Admission: RE | Disposition: A | Discharge status: Home / Self Care | Payer: Self-pay | Source: Home / Self Care | Attending: General Surgery

## 2024-04-25 ENCOUNTER — Encounter (HOSPITAL_COMMUNITY): Payer: Self-pay | Admitting: General Surgery

## 2024-04-25 ENCOUNTER — Other Ambulatory Visit: Payer: Self-pay

## 2024-04-25 ENCOUNTER — Ambulatory Visit (HOSPITAL_COMMUNITY): Payer: Self-pay | Admitting: Physician Assistant

## 2024-04-25 ENCOUNTER — Ambulatory Visit (HOSPITAL_COMMUNITY): Payer: Self-pay | Admitting: Anesthesiology

## 2024-04-25 ENCOUNTER — Ambulatory Visit (HOSPITAL_COMMUNITY)
Admission: RE | Admit: 2024-04-25 | Discharge: 2024-04-25 | Disposition: A | Discharge status: Home / Self Care | Attending: General Surgery | Admitting: General Surgery

## 2024-04-25 ENCOUNTER — Other Ambulatory Visit (HOSPITAL_COMMUNITY): Payer: Self-pay

## 2024-04-25 DIAGNOSIS — I5181 Takotsubo syndrome: Secondary | ICD-10-CM | POA: Diagnosis not present

## 2024-04-25 DIAGNOSIS — I110 Hypertensive heart disease with heart failure: Secondary | ICD-10-CM

## 2024-04-25 DIAGNOSIS — I10 Essential (primary) hypertension: Secondary | ICD-10-CM | POA: Insufficient documentation

## 2024-04-25 DIAGNOSIS — K851 Biliary acute pancreatitis without necrosis or infection: Secondary | ICD-10-CM

## 2024-04-25 DIAGNOSIS — K811 Chronic cholecystitis: Secondary | ICD-10-CM | POA: Insufficient documentation

## 2024-04-25 DIAGNOSIS — Z8719 Personal history of other diseases of the digestive system: Secondary | ICD-10-CM | POA: Insufficient documentation

## 2024-04-25 DIAGNOSIS — I509 Heart failure, unspecified: Secondary | ICD-10-CM | POA: Insufficient documentation

## 2024-04-25 DIAGNOSIS — I340 Nonrheumatic mitral (valve) insufficiency: Secondary | ICD-10-CM | POA: Insufficient documentation

## 2024-04-25 DIAGNOSIS — I5041 Acute combined systolic (congestive) and diastolic (congestive) heart failure: Secondary | ICD-10-CM

## 2024-04-25 DIAGNOSIS — F418 Other specified anxiety disorders: Secondary | ICD-10-CM | POA: Diagnosis not present

## 2024-04-25 HISTORY — PX: CHOLECYSTECTOMY: SHX55

## 2024-04-25 LAB — POCT PREGNANCY, URINE: Preg Test, Ur: NEGATIVE

## 2024-04-25 SURGERY — LAPAROSCOPIC CHOLECYSTECTOMY
Anesthesia: General

## 2024-04-25 MED ORDER — CHLORHEXIDINE GLUCONATE CLOTH 2 % EX PADS: 6.0000 | MEDICATED_PAD | Freq: Once | CUTANEOUS | Status: DC

## 2024-04-25 MED ORDER — FENTANYL CITRATE PF 50 MCG/ML IJ SOSY
25.0000 ug | PREFILLED_SYRINGE | INTRAMUSCULAR | Status: DC | PRN
  Administered 2024-04-25: 50 ug via INTRAVENOUS

## 2024-04-25 MED ORDER — DEXMEDETOMIDINE HCL IN NACL 80 MCG/20ML IV SOLN
INTRAVENOUS | Status: DC | PRN
  Administered 2024-04-25: 8 ug via INTRAVENOUS

## 2024-04-25 MED ORDER — METOPROLOL TARTRATE 5 MG/5ML IV SOLN
INTRAVENOUS | Status: DC | PRN
  Administered 2024-04-25 (×2): 2.5 mg via INTRAVENOUS

## 2024-04-25 MED ORDER — IBUPROFEN 200 MG PO TABS
600.0000 mg | ORAL_TABLET | Freq: Four times a day (QID) | ORAL | 0 refills | Status: AC
  Filled 2024-04-25 (×2): qty 75, 7d supply, fill #0

## 2024-04-25 MED ORDER — OXYCODONE HCL 5 MG PO TABS
5.0000 mg | ORAL_TABLET | Freq: Three times a day (TID) | ORAL | 0 refills | Status: AC | PRN
  Filled 2024-04-25: qty 12, 4d supply, fill #0

## 2024-04-25 MED ORDER — BUPIVACAINE-EPINEPHRINE 0.25% -1:200000 IJ SOLN
INTRAMUSCULAR | Status: DC | PRN
  Administered 2024-04-25: 30 mL

## 2024-04-25 MED ORDER — ORAL CARE MOUTH RINSE: 15.0000 mL | Freq: Once | OROMUCOSAL | Status: AC

## 2024-04-25 MED ORDER — ACETAMINOPHEN 325 MG PO TABS
650.0000 mg | ORAL_TABLET | Freq: Four times a day (QID) | ORAL | 0 refills | Status: AC
  Filled 2024-04-25: qty 50, 7d supply, fill #0

## 2024-04-25 MED ORDER — PROPOFOL 10 MG/ML IV BOLUS
INTRAVENOUS | Status: AC
Start: 2024-04-25 — End: 2024-04-25
  Filled 2024-04-25: qty 20

## 2024-04-25 MED ORDER — MIDAZOLAM HCL 5 MG/5ML IJ SOLN
INTRAMUSCULAR | Status: DC | PRN
  Administered 2024-04-25: 2 mg via INTRAVENOUS

## 2024-04-25 MED ORDER — ACETAMINOPHEN 500 MG PO TABS
1000.0000 mg | ORAL_TABLET | ORAL | Status: AC
  Administered 2024-04-25: 1000 mg via ORAL
  Filled 2024-04-25: qty 2

## 2024-04-25 MED ORDER — GABAPENTIN 300 MG PO CAPS
300.0000 mg | ORAL_CAPSULE | ORAL | Status: AC
  Administered 2024-04-25: 300 mg via ORAL
  Filled 2024-04-25: qty 1

## 2024-04-25 MED ORDER — ONDANSETRON HCL 4 MG/2ML IJ SOLN
INTRAMUSCULAR | Status: AC
  Filled 2024-04-25: qty 2

## 2024-04-25 MED ORDER — ROCURONIUM BROMIDE 10 MG/ML (PF) SYRINGE
PREFILLED_SYRINGE | INTRAVENOUS | Status: AC
Start: 2024-04-25 — End: 2024-04-25
  Filled 2024-04-25: qty 10

## 2024-04-25 MED ORDER — FENTANYL CITRATE PF 50 MCG/ML IJ SOSY
PREFILLED_SYRINGE | INTRAMUSCULAR | Status: AC
  Filled 2024-04-25: qty 1

## 2024-04-25 MED ORDER — 0.9 % SODIUM CHLORIDE (POUR BTL) OPTIME
TOPICAL | Status: DC | PRN
  Administered 2024-04-25: 1000 mL

## 2024-04-25 MED ORDER — DEXAMETHASONE SODIUM PHOSPHATE 10 MG/ML IJ SOLN
INTRAMUSCULAR | Status: AC
  Filled 2024-04-25: qty 1

## 2024-04-25 MED ORDER — SUGAMMADEX SODIUM 200 MG/2ML IV SOLN
INTRAVENOUS | Status: AC
  Filled 2024-04-25: qty 2

## 2024-04-25 MED ORDER — ESMOLOL HCL 100 MG/10ML IV SOLN
INTRAVENOUS | Status: DC | PRN
  Administered 2024-04-25: 20 mg via INTRAVENOUS
  Administered 2024-04-25: 10 mg via INTRAVENOUS
  Administered 2024-04-25: 20 mg via INTRAVENOUS
  Administered 2024-04-25 (×2): 10 mg via INTRAVENOUS

## 2024-04-25 MED ORDER — CEFAZOLIN SODIUM-DEXTROSE 2-4 GM/100ML-% IV SOLN
2.0000 g | INTRAVENOUS | Status: AC
  Administered 2024-04-25: 2 g via INTRAVENOUS
  Filled 2024-04-25: qty 100

## 2024-04-25 MED ORDER — OXYCODONE HCL 5 MG/5ML PO SOLN: 5.0000 mg | Freq: Once | ORAL | Status: AC | PRN

## 2024-04-25 MED ORDER — AMISULPRIDE (ANTIEMETIC) 5 MG/2ML IV SOLN
10.0000 mg | Freq: Once | INTRAVENOUS | Status: AC | PRN
  Administered 2024-04-25: 10 mg via INTRAVENOUS

## 2024-04-25 MED ORDER — CHLORHEXIDINE GLUCONATE 0.12 % MT SOLN
15.0000 mL | Freq: Once | OROMUCOSAL | Status: AC
  Administered 2024-04-25: 15 mL via OROMUCOSAL

## 2024-04-25 MED ORDER — BUPIVACAINE-EPINEPHRINE (PF) 0.25% -1:200000 IJ SOLN
INTRAMUSCULAR | Status: AC
Start: 2024-04-25 — End: 2024-04-25
  Filled 2024-04-25: qty 30

## 2024-04-25 MED ORDER — HYDROMORPHONE HCL 2 MG/ML IJ SOLN
INTRAMUSCULAR | Status: AC
  Filled 2024-04-25: qty 1

## 2024-04-25 MED ORDER — CELECOXIB 200 MG PO CAPS
200.0000 mg | ORAL_CAPSULE | ORAL | Status: AC
  Administered 2024-04-25: 200 mg via ORAL
  Filled 2024-04-25: qty 1

## 2024-04-25 MED ORDER — FENTANYL CITRATE (PF) 100 MCG/2ML IJ SOLN
INTRAMUSCULAR | Status: AC
  Filled 2024-04-25: qty 2

## 2024-04-25 MED ORDER — MIDAZOLAM HCL 2 MG/2ML IJ SOLN
INTRAMUSCULAR | Status: AC
  Filled 2024-04-25: qty 2

## 2024-04-25 MED ORDER — AMISULPRIDE (ANTIEMETIC) 5 MG/2ML IV SOLN
INTRAVENOUS | Status: AC
  Filled 2024-04-25: qty 4

## 2024-04-25 MED ORDER — ONDANSETRON HCL 4 MG/2ML IJ SOLN
INTRAMUSCULAR | Status: DC | PRN
  Administered 2024-04-25: 4 mg via INTRAVENOUS

## 2024-04-25 MED ORDER — INDOCYANINE GREEN 25 MG IV SOLR
INTRAVENOUS | Status: DC | PRN
Start: 2024-04-25 — End: 2024-04-25
  Administered 2024-04-25: 7.5 mg via INTRAVENOUS

## 2024-04-25 MED ORDER — FENTANYL CITRATE (PF) 100 MCG/2ML IJ SOLN
INTRAMUSCULAR | Status: AC
Start: 2024-04-25 — End: 2024-04-25
  Filled 2024-04-25: qty 2

## 2024-04-25 MED ORDER — LIDOCAINE HCL (PF) 2 % IJ SOLN
INTRAMUSCULAR | Status: AC
  Filled 2024-04-25: qty 5

## 2024-04-25 MED ORDER — LIDOCAINE HCL (PF) 2 % IJ SOLN
INTRAMUSCULAR | Status: DC | PRN
  Administered 2024-04-25: 100 mg via INTRADERMAL

## 2024-04-25 MED ORDER — OXYCODONE HCL 5 MG PO TABS
ORAL_TABLET | ORAL | Status: AC
  Filled 2024-04-25: qty 1

## 2024-04-25 MED ORDER — OXYCODONE HCL 5 MG/5ML PO SOLN: 5.0000 mg | Freq: Once | ORAL | Status: DC | PRN

## 2024-04-25 MED ORDER — ROCURONIUM BROMIDE 10 MG/ML (PF) SYRINGE
PREFILLED_SYRINGE | INTRAVENOUS | Status: DC | PRN
  Administered 2024-04-25: 40 mg via INTRAVENOUS

## 2024-04-25 MED ORDER — OXYCODONE HCL 5 MG PO TABS
5.0000 mg | ORAL_TABLET | Freq: Once | ORAL | Status: AC | PRN
  Administered 2024-04-25: 5 mg via ORAL

## 2024-04-25 MED ORDER — LACTATED RINGERS IR SOLN
Status: DC | PRN
  Administered 2024-04-25: 1000 mL

## 2024-04-25 MED ORDER — DEXAMETHASONE SODIUM PHOSPHATE 10 MG/ML IJ SOLN
INTRAMUSCULAR | Status: DC | PRN
  Administered 2024-04-25: 10 mg via INTRAVENOUS

## 2024-04-25 MED ORDER — LACTATED RINGERS IV SOLN: INTRAVENOUS | Status: DC

## 2024-04-25 MED ORDER — FENTANYL CITRATE PF 50 MCG/ML IJ SOSY: 25.0000 ug | PREFILLED_SYRINGE | INTRAMUSCULAR | Status: DC | PRN

## 2024-04-25 MED ORDER — FENTANYL CITRATE (PF) 100 MCG/2ML IJ SOLN
INTRAMUSCULAR | Status: DC | PRN
  Administered 2024-04-25: 50 ug via INTRAVENOUS
  Administered 2024-04-25: 100 ug via INTRAVENOUS
  Administered 2024-04-25: 50 ug via INTRAVENOUS

## 2024-04-25 MED ORDER — OXYCODONE HCL 5 MG PO TABS: 5.0000 mg | ORAL_TABLET | Freq: Once | ORAL | Status: DC | PRN

## 2024-04-25 MED ORDER — HYDROMORPHONE HCL 1 MG/ML IJ SOLN
INTRAMUSCULAR | Status: DC | PRN
  Administered 2024-04-25 (×2): .5 mg via INTRAVENOUS

## 2024-04-25 MED ORDER — SUGAMMADEX SODIUM 200 MG/2ML IV SOLN
INTRAVENOUS | Status: DC | PRN
  Administered 2024-04-25: 200 mg via INTRAVENOUS

## 2024-04-25 MED ORDER — PROPOFOL 10 MG/ML IV BOLUS
INTRAVENOUS | Status: DC | PRN
  Administered 2024-04-25: 20 mg via INTRAVENOUS
  Administered 2024-04-25: 150 mg via INTRAVENOUS

## 2024-04-25 SURGICAL SUPPLY — 34 items
CABLE HIGH FREQUENCY MONO STRZ (ELECTRODE) IMPLANT
CATH CHOLANG 76X19 KUMAR (CATHETERS) IMPLANT
CHLORAPREP W/TINT 26 (MISCELLANEOUS) ×1 IMPLANT
CLIP APPLIE 5 13 M/L LIGAMAX5 (MISCELLANEOUS) IMPLANT
CLIP APPLIE ROT 10 11.4 M/L (STAPLE) IMPLANT
COVER MAYO STAND XLG (MISCELLANEOUS) IMPLANT
COVER SURGICAL LIGHT HANDLE (MISCELLANEOUS) ×1 IMPLANT
DERMABOND ADVANCED .7 DNX12 (GAUZE/BANDAGES/DRESSINGS) ×1 IMPLANT
DERMABOND ADVANCED .7 DNX6 (GAUZE/BANDAGES/DRESSINGS) IMPLANT
DRAPE C-ARM 42X120 X-RAY (DRAPES) IMPLANT
ELECT PENCIL ROCKER SW 15FT (MISCELLANEOUS) ×1 IMPLANT
ELECT REM PT RETURN 15FT ADLT (MISCELLANEOUS) ×1 IMPLANT
ENDOLOOP SUT PDS II 0 18 (SUTURE) ×1 IMPLANT
GLOVE BIO SURGEON STRL SZ7 (GLOVE) ×1 IMPLANT
GOWN STRL REUS W/ TWL XL LVL3 (GOWN DISPOSABLE) ×1 IMPLANT
GRASPER SUT TROCAR 14GX15 (MISCELLANEOUS) IMPLANT
HEMOSTAT SNOW SURGICEL 2X4 (HEMOSTASIS) IMPLANT
IRRIGATION SUCT STRKRFLW 2 WTP (MISCELLANEOUS) IMPLANT
KIT BASIN OR (CUSTOM PROCEDURE TRAY) ×1 IMPLANT
KIT TURNOVER KIT A (KITS) ×1 IMPLANT
LHOOK LAP DISP 36CM (ELECTROSURGICAL) ×1 IMPLANT
NDL INSUFFLATION 14GA 120MM (NEEDLE) ×1 IMPLANT
NEEDLE INSUFFLATION 14GA 120MM (NEEDLE) ×1 IMPLANT
POUCH RETRIEVAL ECOSAC 10 (ENDOMECHANICALS) ×1 IMPLANT
SCISSORS LAP 5X35 DISP (ENDOMECHANICALS) ×1 IMPLANT
SET TUBE SMOKE EVAC HIGH FLOW (TUBING) ×1 IMPLANT
SLEEVE Z-THREAD 5X100MM (TROCAR) ×2 IMPLANT
SPIKE FLUID TRANSFER (MISCELLANEOUS) ×1 IMPLANT
SUT MNCRL AB 4-0 PS2 18 (SUTURE) ×1 IMPLANT
SUT VICRYL 0 UR6 27IN ABS (SUTURE) IMPLANT
TOWEL OR 17X26 10 PK STRL BLUE (TOWEL DISPOSABLE) ×1 IMPLANT
TRAY LAPAROSCOPIC (CUSTOM PROCEDURE TRAY) ×1 IMPLANT
TROCAR ADV FIXATION 12X100MM (TROCAR) ×1 IMPLANT
TROCAR Z-THREAD OPTICAL 5X100M (TROCAR) ×1 IMPLANT

## 2024-04-25 NOTE — Transfer of Care (Signed)
 Immediate Anesthesia Transfer of Care Note  Patient: Melissa Guzman  Procedure(s) Performed: LAPAROSCOPIC CHOLECYSTECTOMY  Patient Location: PACU  Anesthesia Type:General  Level of Consciousness: awake and patient cooperative  Airway & Oxygen Therapy: Patient Spontanous Breathing and Patient connected to face mask oxygen  Post-op Assessment: Report given to RN and Post -op Vital signs reviewed and stable  Post vital signs: Reviewed and stable  Last Vitals:  Vitals Value Taken Time  BP 154/100 04/25/24 15:10  Temp    Pulse 96 04/25/24 15:12  Resp 12 04/25/24 15:12  SpO2 100 % 04/25/24 15:12  Vitals shown include unfiled device data.  Last Pain:  Vitals:   04/25/24 1118  TempSrc: Oral  PainSc: 0-No pain         Complications: No notable events documented.

## 2024-04-25 NOTE — Anesthesia Preprocedure Evaluation (Addendum)
 Anesthesia Evaluation  Patient identified by MRN, date of birth, ID band Patient awake    Reviewed: Allergy & Precautions, NPO status , Patient's Chart, lab work & pertinent test results  History of Anesthesia Complications Negative for: history of anesthetic complications  Airway Mallampati: II  TM Distance: >3 FB Neck ROM: Full   Comment: Previous grade I view with [facility]  (+) Dental Advisory Given Piercing in top gum in the front. Patient asked to remove it.:   Pulmonary neg shortness of breath, neg sleep apnea, neg COPD, neg recent URI, Patient abstained from smoking.   Pulmonary exam normal breath sounds clear to auscultation       Cardiovascular hypertension (amlodipine ), Pt. on medications (-) angina +CHF (Takotsubo cardiomyopathy 01/2024)  (-) Past MI, (-) Cardiac Stents, (-) CABG, (-) Orthopnea and (-) PND + dysrhythmias (prolonged QT)  Rhythm:Regular Rate:Normal  EF 20-25% in 01/2024 with LV mural thrombus seen on echo. No LV thrombus on cardiac MRI. EF since improved to 45-50%.  TTE 04/07/2024: IMPRESSIONS    1. Left ventricular ejection fraction, by estimation, is 45 to 50%. Left  ventricular ejection fraction by 3D volume is 53 %. The left ventricle has  mildly decreased function. The left ventricle has no regional wall motion  abnormalities. Left ventricular   diastolic parameters were normal. The average left ventricular global  longitudinal strain is -19.2 %. The global longitudinal strain is normal.   2. Right ventricular systolic function is normal. The right ventricular  size is normal.   3. The mitral valve is normal in structure. Mild mitral valve  regurgitation.   4. The aortic valve is normal in structure. Aortic valve regurgitation is  trivial.     Neuro/Psych  PSYCHIATRIC DISORDERS Anxiety Depression    negative neurological ROS     GI/Hepatic ,neg GERD  ,,(+) Hepatitis -Gallstone  pancreatitis   Endo/Other  negative endocrine ROS    Renal/GU negative Renal ROS     Musculoskeletal   Abdominal   Peds  Hematology  (+) Blood dyscrasia (alpha thalassemia silent carrier), anemia Lab Results      Component                Value               Date                      WBC                      6.1                 04/21/2024                HGB                      11.4 (L)            04/21/2024                HCT                      36.0                04/21/2024                MCV  97.6                04/21/2024                PLT                      234                 04/21/2024              Anesthesia Other Findings   Reproductive/Obstetrics                              Anesthesia Physical Anesthesia Plan  ASA: 3  Anesthesia Plan: General   Post-op Pain Management: Tylenol  PO (pre-op)*   Induction: Intravenous  PONV Risk Score and Plan: 3 and Ondansetron , Dexamethasone  and Treatment may vary due to age or medical condition  Airway Management Planned: Oral ETT  Additional Equipment:   Intra-op Plan:   Post-operative Plan: Extubation in OR  Informed Consent: I have reviewed the patients History and Physical, chart, labs and discussed the procedure including the risks, benefits and alternatives for the proposed anesthesia with the patient or authorized representative who has indicated his/her understanding and acceptance.     Dental advisory given  Plan Discussed with: CRNA and Anesthesiologist  Anesthesia Plan Comments: (Risks of general anesthesia discussed including, but not limited to, sore throat, hoarse voice, chipped/damaged teeth, injury to vocal cords, nausea and vomiting, allergic reactions, lung infection, heart attack, stroke, and death. All questions answered. )         Anesthesia Quick Evaluation

## 2024-04-25 NOTE — Op Note (Signed)
 Patient: Melissa Guzman MRN: [id number] DOB: 06/29/1996 Sex: female Operation/Procedure Date: 04/25/2024 Surgeons and Role:    * Polly, Cordella LABOR, MD - Primary    * Kinsinger, Herlene Righter, MD - Assisting. Given the complexity of the patient's anatomy and the fact that she had multiple adhesions a surgeon assistant was necessary. The assistant helped with exposure and dissection and aided in identification of anatomy.   Pre-operative Diagnoses: GALLSTONE PANCREATITIS Postoperative Diagnoses: GALLSTONE PANCREATITIS  Procedure performed: Laparoscopic cholecystectomy  Anesthesia: General endotracheal anesthesia  Indications: Melissa Guzman is a 28 year old female who was admitted earlier this year for pancreatitis that was worked up and thought to be related to gallstones. She underwent diagnostic laparoscopy but there were too many adhesions to allow for safe cholecystectomy. She presents today for cholecystectomy. Preoperatively, I discussed in detail the risks, benefits, alternatives, and potential complications. The patient understands and requests to proceed.  Operative Findings: Multiple adhesions between the liver and abdominal wall, between the transverse colon and abdominal wall, and between the colon and small bowel and the gallbladder. Critical view obtained prior to placing clips.   Operative Narrative: The patient was positively identified and was taken to the operating room and placed supine on the operating table. A time-out was performed confirming correct patient and procedure. We also confirmed initiation of deep venous thrombosis prophylaxis and wound prophylaxis. After successful induction of general endotracheal anesthesia, the arms were carefully padded. An orogastric tube and footboard were placed. The abdomen was prepped and draped in the usual sterile surgical fashion.  We began our peritoneal access with a veress needle inserted at Palmer's point.  After  aspiration showed return of air bubbles and there was a positive saline drop test, the insufflation was connected and the abdomen brought to a pressure of .  We then used an opti-view technique to place a 5mm port just to the right of midline, superior to the umbilicus.  A laparoscope was introduced into the abdomen, and there were no signs of injury from entry. There were multiple adhesions between the colon and abdominal wall, between the colon and small bowel and the gallbladder, and between the liver and the abdominal wall. I was able to visualize the veress needle to confirm no injury from entry, but given the complexity of the case I chose to call my partner, Dr. Stevie, to the OR for assistance. A 12mm port was placed in the subxiphoid position.  Two additional 5mm ports were placed in the RUQ.  A 360-degree visualization with a 30-degree 5-mm laparoscope revealed grossly normal intra-abdominal contents.   The patient was placed in the head up position and tilted slightly to the left. We were able to carefully dissect the colon and small bowel off of the anterior surface of the gallbladder using a combination of blunt dissection and selective electrocautery. The dome of the gallbladder was then grasped, elevated, and retracted anteriorly and cephalad.  The infundibulum was retracted laterally and inferiorly exposing Calot's triangle. The investing visceral peritoneal attachments overlying the infundibulum of the gallbladder were incised using the hook electrocautery and dissected free from the gallbladder itself. We soon developed two structures into the gallbladder consistent with the cystic duct and cystic artery. The loose areolar tissue around these structures was dissected free. The gallbladder was separated from the gallbladder fossa for approximately a third the distance up from the cystic plate, establishing the critical view of safety. We then transitioned to infrared viewing mode to  allow  for visualization of the ICG tracer. There was tracer throughout the liver. There was tracer within the candidate cystic duct as well as in a separate structure medially to the duct, consistent with where the common bile duct would be expected.  There was no tracer within the candidate cystic artery.  There was tracer within the duodenum, indicating no presence of a biliary obstruction. We returned to normal viewing mode. The cystic duct and cystic artery were triply clipped. These were divided using laparoscopic scissors leaving a single clip on the removal side. The gallbladder was elevated off the gallbladder fossa using the hook Bovie. The gallbladder was then exteriorized through the subxiphoid port site using an EndoCatch bag. We reestablished pneumoperitoneum and confirmed no leakage of blood or bile. We confirmed integrity of our clips. The subhepatic space was irrigated with warm sterile saline and suctioned free. The 12mm port site was closed using an 0-vicryl on a suture passer.We placed 0.25% Marcaine  with epinephrine  at each incision site for local anesthesia. The skin was closed using 4-0 Monocryl subcuticular suture. Dermabond was applied. The patient tolerated the procedure well, was extubated, and taken to the recovery room.  Estimated Blood Loss: Minimal Specimens: Gallbladder Implants: None Drains: None Complications: None Condition of the patient: Good, extubated Disposition: PACU  Cordella DELENA Idler Date: 04/25/2024 Time: 2:40 PM

## 2024-04-25 NOTE — Discharge Instructions (Signed)
 Home Care After Gallbladder Removal  Activity  Limit activity for the first 24 hours, then you may return to normal daily activities. Returning to normal daily activities as soon as you can following surgery will enhance recovery time.  No heavy lifting pushing or pulling, anything heavier than 10 pounds (gallon of milk weighs approx. 8.8 pounds) for 2 weeks from surgery date.  Do not mow the lawn, use a vacuum cleaner, or do any other strenuous activities without first consulting your surgical team.  Climb stairs slowly and watch your step.  Walk as often as you feel able to increase strength and endurance.  No driving or operating heavy machinery within 24 hours of taking narcotic pain medication.  Diet  Drink plenty of fluids and eat a light meal on the night of surgery. Some patients may find their appetite is poor for a week or two after surgery. This is a normal result of the stress of surgery-your appetite will return in time.   There are no specific diet restrictions after surgery and you resume regular diet as tolerated. However, you may want to refrain from fatty, heavy, and/or greasy foods and follow a low fat diet for 2-4 weeks to avoid temporary diarrhea and nausea. Slowly add fats back into diet.   If you have diarrhea, try avoiding spicy foods, dairy products, fatty foods, and alcohol. You can also watch to see if specific foods cause it, and stop eating them. If the diarrhea continues for more than 2 weeks, talk to your doctor.  Dressings and Wound Care  Keep your wound or incision site clean and dry.  You may have different types of dressings covering your incisions depending on your operation and your surgeon: o Dermabond/Durabond (skin glue): This will usually remain in place for 10-14 days, then naturally fall off your skin. You may take a shower 24 hrs after surgery, carefully wash, not scrub the incision site with a mild non-scented soap. Pat dry with a soft towel.  Do not pick  or peel skin glue off. o Combined outer dressing and inner steri-strips: The outer dressing is typically a plastic film that has a small bandage attached to it. This outer dressing can be removed two days after your operation. For example, if your operation was on Monday, the outer dressing can be removed Wednesday. This should be done for each site you have an incision. Once removed, you will expose the inner steri-strip which looks like a paper strip. Leave these on for about two weeks until they start to peel and ten you can take them off.  o Steri-strips alone: Leave these on for about two weeks until they start to peel and then you can take them off.   You can shower and let the water fall on the dressings above. Do not soak or submerge your incision(s) in a bath tub, hot tub, or swimming pool, until your doctor says it is ok to do so or the incision(s) have completely healed, usually about 2-4 weeks.  Do not use creams, powder, salves or balms on your incision(s).  What to Expect After Surgery  Moderate discomfort controlled with medications  You may feel pain in one or both shoulders. This pain comes from the gas still left in your belly after the surgery, if you had laparoscopic surgery (several small incisions). The pain should ease over several days to a week. Ambulation will help with this pain.   Minimal drainage from incision  Belly swelling  Feeling fatigue and weak  Loose stools after eating. This may occur for 4-8 weeks; however longer in some cases.   Constipation after surgery is common. Drink plenty fluids and eat a high fiber diet.   Pain Control: Prescribed Non-Narcotic Pain Medication  You will be given three prescriptions.  Two of them will be for prescription strength ibuprofen (i.e. Advil) and prescription strength acetaminophen (i.e. Tylenol).  The vast majority of patients will just need these two medications.  One prescription will be for a 'rescue' prescription of an oral  narcotic (oxycodone).  You may fill this if needed.  You will alternate taking the ibuprofen (600mg ) every 6 hours and also the acetaminophen (650mg ) every 6 hours so that you are taking one of those medications every 3 hours.  For example: o 0800 - take ibuprofen 600mg  o 1100 - take acetaminophen 650mg  o 1400 - take ibuprofen 600mg  o 1700 - take acetaminophen 650mg  o Etc.  Continue taking this alternating pattern of ibuprofen and acetaminophen for 3 days  If you cannot take one or the other of these medications, just take the one you can every 6 hours.  If you are comfortable at night, you don't have to wake up and take a medication.  If you are still uncomfortable after taking either ibuprofen or acetaminophen, try gentle stretching exercise and ice packs (a bag of frozen vegetables works great).  If you are still uncomfortable, you may fill the narcotic prescription of Oxycodone and take as directed.  Once you have completed these prescriptions, your pain level should be low enough to stop taking medications altogether or just use an over the counter medication (ibuprofen or acetaminophen) as needed.   Pain Control: Over the Counter Medications to take as needed  Colace/Docusate: May be prescribed by your surgeon to prevent constipation caused by the combination of narcotics, effects of anesthesia, and decreased ambulation.  Hold for loose stools or diarrhea. Take 100 mg 1-2 times a day starting tonight.   Fiber: High fiber foods, extra liquids (water 9-13 cups/day) can also assist with constipation. Examples of high fiber foods are fruit, bran. Prune juice and water are also good liquids to drink.  Milk of Magnesia/Miralax:  If constipated despite take the over the counter stool softeners, you may take Milk of Magnesia or Miralax as directed on bottle to assist with constipation.     Pepcid/Famotidine: May be prescribed while taking naproxen (Aleve) or other NSAIDs such as ibuprofen  (Motrin/Advil) to prevent stomach upset or Acid-reflux symptoms. Take 1 tablet 1-2 times a day.   **Constipation: The first bowel movement may occur anywhere between 1-5 days after surgery.  As long as you are not nauseated or not having significant abdominal pain this variation is acceptable. Narcotic pain medications can cause constipation increasing discomfort; early discontinuation will assist with bowel management.If constipated despite taking stool softeners,  you may take Milk of Magnesia or Miralax as directed on the bottle.     **Home medications: You may restart your home medications as directed by your respective Primary Care Physician or Surgeon.   When to notify your Doctor or Healthcare Team  Sign of Wound Infection   Fever over 100 degrees.  Wound becomes extremely swollen, shows red streaks, warm to the touch, and/or drainage from the incision site or foul-smelling drainage.  Wound edges separate or opens up  Bleeding or bruising   If you have bleeding, apply pressure to the site and hold the pressure firmly for 5  minutes. If the bleeding continues, apply pressure again and call 911. If the bleeding stopped, call your doctor to report it.   Call your doctor or nurse if you have increased bleeding from your site and increased bruising or a lump forms or gets larger under your skin at the site.  Unrelieved Pain    Call your doctor or nurse if your pain gets worse or is not eased 1 hour after taking your pain medicine, or if it is severe and uncontrolled.  Fever, Flu-like symptoms   Fever over 100 degrees and/or chills  Gastrointestinal Symptoms    If you have yellowing of your eyes or skin (jaundice)  If you have dark or rust-colored urine  If your stool is gray colored   If you have nausea and vomiting that continues more than 24 hours, will not let you keep medicine down and will not let you keep fluids down  Black tarry bowel movements. This can be normal after surgery  on the stomach, but should resolve in a day or two.    Call 911 if you suddenly have signs of blood loss such as:  Vomiting blood  Fast heart rate  Feeling faint, sweaty, or blacking out  Passing bright red blood from your rectum  Blood Clot Symptoms   Tender, swollen or reddened areas in your calf muscle or thighs.  Numbness or tingling in your lower leg or calf, or at the top of your leg or groin  Skin on your leg looks pale or blue or feels cold to touch  Chest pain or have trouble breathing, lightheadedness, fast heart rate  Sudden Onset of Symptoms    Call 911 if you suddenly have:  Leg weakness and spasm  Loss of bladder or bowel function  Seizure  Confusion, severe headache, dizziness or feeling unsteady, problems talking, difficulty swallowing, and/or numbness or muscle weakness as these could be signs of a stroke.   Follow up Appointment Your follow up appointment should be scheduled 2-3 weeks after your surgery date.  If you have not previously scheduled for a follow-up visit you can be scheduled by contacting [phone]

## 2024-04-25 NOTE — H&P (Signed)
     Melissa Guzman [birth year]  [id number].    HPI:  28 y/o F who was admitted in January with pancreatitis that was considered to be related to gallstones. She underwent attempted cholecystectomy, but this was aborted due to ongoing inflammation of the pancreas. 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 She has recovered from those insults and was recently evaluated by cardiology and determined to be appropriate for surgery. She denies any recent changes in her health or medications.  ROS: Review of Systems  Constitutional: Negative.   HENT: Negative.    Eyes: Negative.   Respiratory: Negative.    Cardiovascular: Negative.   Gastrointestinal: Negative.   Genitourinary: Negative.   Musculoskeletal: Negative.   Skin: Negative.   Neurological: Negative.   Endo/Heme/Allergies: Negative.   Psychiatric/Behavioral: Negative.      History reviewed. No pertinent family history.  Past Medical History:  Diagnosis Date   Acute blood loss anemia 03/01/2022   Anemia    Anxiety    COVID-19 affecting pregnancy in third trimester 02/04/2022   +01/28/2022   Endometritis 03/05/2022   Hypertension    Pancreatitis    Postpartum septic endometritis 03/09/2022   Stress-induced cardiomyopathy    UTI (urinary tract infection) 05/26/2023    Past Surgical History:  Procedure Laterality Date   CESAREAN SECTION N/A 03/01/2022   Procedure: CESAREAN SECTION;  Surgeon: Lorence Ozell CROME, MD;  Location: [facility];  Service: Obstetrics;  Laterality: N/A;   DIAGNOSTIC LAPAROSCOPIC LIVER BIOPSY N/A 10/09/2023   Procedure: DIAGNOSTIC LAPAROSCOPY;  Surgeon: Polly Cordella LABOR, MD;  Location: [facility] OR;  Service: General;  Laterality: N/A;   IR RADIOLOGIST EVAL & MGMT  03/28/2022   IR RADIOLOGIST EVAL & MGMT  04/10/2022    Social History:  reports that she has never smoked. She has never used smokeless tobacco. She  reports that she does not currently use alcohol. She reports that she does not currently use drugs after having used the following drugs: Marijuana. Frequency: 3.00 times per week.  Allergies: No Known Allergies  Medications Prior to Admission  Medication Sig Dispense Refill   acetaminophen  (TYLENOL ) 500 MG tablet Take 1,000 mg by mouth every 6 (six) hours as needed (pain/headaches.).     amLODipine  (NORVASC ) 5 MG tablet Take 5 mg by mouth daily.     ibuprofen  (ADVIL ) 200 MG tablet Take 400-600 mg by mouth every 8 (eight) hours as needed (pain/headache.).      Physical Exam: Blood pressure (!) 126/108, pulse 87, temperature 98.9 F (37.2 C), temperature source Oral, resp. rate 18, height 5' 1 (1.549 m), weight 51.9 kg, last menstrual period 03/14/2024, SpO2 99%. Gen: female, NAD Abd: soft, non-distended, non-tender  No results found for this or any previous visit (from the past 48 hours). No results found.  Assessment/Plan 28 y/o F w/ a hx of gallstone pancreatitis  - Will proceed to the OR. We discussed the alternatives and potential risks of surgery, including but not limited to: bleeding, infection, damage to bowel or surrounding structures, bile leak, damage to the biliary system, retained stone, pancreatitis, and need for additional procedures. All questions were addressed and consent was obtained.    Cordella LABOR Polly Marlis [facility] 04/25/2024, 11:22 AM Please see Amion for pager number during day hours 7:00am-4:30pm or 7:00am -11:30am on weekends

## 2024-04-25 NOTE — Anesthesia Procedure Notes (Signed)
 Procedure Name: Intubation Date/Time: 04/25/2024 1:16 PM  Performed by: Franchot Delon RAMAN, CRNAPre-anesthesia Checklist: Patient identified, Emergency Drugs available, Suction available and Patient being monitored Patient Re-evaluated:Patient Re-evaluated prior to induction Oxygen Delivery Method: Circle System Utilized Preoxygenation: Pre-oxygenation with 100% oxygen Induction Type: IV induction Ventilation: Mask ventilation without difficulty Laryngoscope Size: Mac and 3 Grade View: Grade I Tube type: Oral Tube size: 7.0 mm Number of attempts: 1 Airway Equipment and Method: Stylet Placement Confirmation: ETT inserted through vocal cords under direct vision, positive ETCO2 and breath sounds checked- equal and bilateral Secured at: 22 cm Tube secured with: Tape Dental Injury: Teeth and Oropharynx as per pre-operative assessment

## 2024-04-26 ENCOUNTER — Encounter: Payer: Self-pay | Admitting: Family Medicine

## 2024-04-26 ENCOUNTER — Encounter (HOSPITAL_COMMUNITY): Payer: Self-pay | Admitting: General Surgery

## 2024-04-26 LAB — SURGICAL PATHOLOGY

## 2024-04-27 NOTE — Anesthesia Postprocedure Evaluation (Signed)
 Anesthesia Post Note  Patient: Melissa Guzman  Procedure(s) Performed: LAPAROSCOPIC CHOLECYSTECTOMY     Patient location during evaluation: PACU Anesthesia Type: General Level of consciousness: awake and alert Pain management: pain level controlled Vital Signs Assessment: post-procedure vital signs reviewed and stable Respiratory status: spontaneous breathing, nonlabored ventilation, respiratory function stable and patient connected to nasal cannula oxygen Cardiovascular status: blood pressure returned to baseline and stable Postop Assessment: no apparent nausea or vomiting Anesthetic complications: no   No notable events documented.  Last Vitals:  Vitals:   04/25/24 1636 04/25/24 1700  BP: (!) 163/112 (!) 148/112  Pulse: 87 84  Resp:    Temp:    SpO2: 95% 93%    Last Pain:  Vitals:   04/25/24 1715  TempSrc:   PainSc: 3                  Chany Woolworth S
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
## 2024-05-11 ENCOUNTER — Ambulatory Visit: Admitting: Family Medicine

## 2024-05-29 NOTE — Progress Notes (Deleted)
 Cardiology Office Note:    Date:  05/29/2024   ID:  Melissa Guzman, DOB [birth year], MRN [id number]  PCP:  Austin Mutton, MD  Cardiologist:  None  Electrophysiologist:  None   Referring MD: [facility]   No chief complaint on file.   History of Present Illness:    Melissa Guzman is a 28 y.o. female with a hx of Takotsubo cardiomyopathy, pancreatitis who presents for follow-up.  She was admitted 01/2024 with gallstone pancreatitis.  Echocardiogram showed EF 20 to 25%, possible LV thrombus, moderate RV dysfunction.  Cardiac MRI was done which showed findings consistent with Takotsubo cardiomyopathy, no LV thrombus, LVEF 30%, RVEF 44%.  Repeat echocardiogram 04/07/2024 showed EF 45 to 50%, normal RV function.  She underwent cholecystectomy 04/25/2024.  Since last clinic visit,  discharge from hospital, she reports she is doing okay.  Denies any chest pain, dyspnea, lightheadedness, syncope, lower extremity edema, or palpitations.   Past Medical History:  Diagnosis Date   Acute blood loss anemia 03/01/2022   Anemia    Anxiety    COVID-19 affecting pregnancy in third trimester 02/04/2022   +01/28/2022   Endometritis 03/05/2022   Hypertension    Pancreatitis    Postpartum septic endometritis 03/09/2022   Stress-induced cardiomyopathy    UTI (urinary tract infection) 05/26/2023    Past Surgical History:  Procedure Laterality Date   CESAREAN SECTION N/A 03/01/2022   Procedure: CESAREAN SECTION;  Surgeon: Lorence Ozell CROME, MD;  Location: [facility];  Service: Obstetrics;  Laterality: N/A;   CHOLECYSTECTOMY N/A 04/25/2024   Procedure: LAPAROSCOPIC CHOLECYSTECTOMY;  Surgeon: Polly Cordella LABOR, MD;  Location: [facility] ORS;  Service: General;  Laterality: N/A;   DIAGNOSTIC LAPAROSCOPIC LIVER BIOPSY N/A 10/09/2023   Procedure: DIAGNOSTIC LAPAROSCOPY;  Surgeon: Polly Cordella LABOR, MD;  Location: [facility];  Service: General;  Laterality: N/A;   IR RADIOLOGIST EVAL & MGMT   03/28/2022   IR RADIOLOGIST EVAL & MGMT  04/10/2022    Current Medications: No outpatient medications have been marked as taking for the 06/02/24 encounter (Appointment) with Kate Lonni CROME, MD.     Allergies:   Patient has no known allergies.   Social History   Socioeconomic History   Marital status: Single    Spouse name: Not on file   Number of children: 1   Years of education: Not on file   Highest education level: Not on file  Occupational History   Not on file  Tobacco Use   Smoking status: Never   Smokeless tobacco: Never  Vaping Use   Vaping status: Never Used  Substance and Sexual Activity   Alcohol use: Not Currently   Drug use: Not Currently    Frequency: 3.0 times per week    Types: Marijuana    Comment: Last use a few months ago   Sexual activity: Yes    Birth control/protection: None  Other Topics Concern   Not on file  Social History Narrative   Not on file   Social Drivers of Health   Financial Resource Strain: Not on file  Food Insecurity: No Food Insecurity (02/11/2024)   Hunger Vital Sign    Worried About Running Out of Food in the Last Year: Never true    Ran Out of Food in the Last Year: Never true  Transportation Needs: No Transportation Needs (02/11/2024)   PRAPARE - Administrator, Civil Service (Medical): No    Lack of Transportation (Non-Medical): No  Physical Activity: Not on file  Stress: Not on file  Social Connections: Unknown (02/11/2022)   Received from [facility]   Social Network    Social Network: Not on file     Family History: The patient's family history is not on file.  ROS:   Please see the history of present illness.     All other systems reviewed and are negative.  EKGs/Labs/Other Studies Reviewed:    The following studies were reviewed today:   EKG:   1625: Normal sinus rhythm, rate 80, QTc 525, diffuse T wave inversions  Recent Labs: 02/27/2024: Pro Brain Natriuretic Peptide  229.0 03/28/2024: Magnesium  1.3 04/21/2024: ALT 34; BUN 6; Creatinine, Ser 0.54; Hemoglobin 11.4; Platelets 234; Potassium 3.4; Sodium 139  Recent Lipid Panel    Component Value Date/Time   TRIG 80 10/06/2023 0041    Physical Exam:    VS:  There were no vitals taken for this visit.    Wt Readings from Last 3 Encounters:  04/25/24 114 lb 6.4 oz (51.9 kg)  04/21/24 114 lb 6.4 oz (51.9 kg)  03/27/24 117 lb (53.1 kg)     GEN:  Well nourished, well developed in no acute distress HEENT: Normal NECK: No JVD; No carotid bruits LYMPHATICS: No lymphadenopathy CARDIAC: RRR, no murmurs, rubs, gallops RESPIRATORY:  Clear to auscultation without rales, wheezing or rhonchi  ABDOMEN: Soft, non-tender, non-distended MUSCULOSKELETAL:  No edema; No deformity  SKIN: Warm and dry NEUROLOGIC:  Alert and oriented x 3 PSYCHIATRIC:  Normal affect   ASSESSMENT:    No diagnosis found.  PLAN:    Takotsubo cardiomyopathy: She was admitted 01/2024 with gallstone pancreatitis.  Echocardiogram showed EF 20 to 25%, possible LV thrombus, moderate RV dysfunction.  Cardiac MRI was done which showed findings consistent with Takotsubo cardiomyopathy, no LV thrombus, LVEF 30%, RVEF 44%.  Repeat echocardiogram 04/07/2024 showed EF 45 to 50%, normal RV function. - Continue losartan  25 mg daily - Continue Toprol -XL 25 mg daily  Gallstone pancreatitis: Underwent cholecystectomy 04/25/2024  QT prolongation: Noted during admission, can be seen in setting of stress-induced cardiomyopathy.  Avoid QT prolonging agents.  EKG in clinic today showed***  RTC in 3 months***   Medication Adjustments/Labs and Tests Ordered: Current medicines are reviewed at length with the patient today.  Concerns regarding medicines are outlined above.  No orders of the defined types were placed in this encounter.  No orders of the defined types were placed in this encounter.   There are no Patient Instructions on file for this visit.    Signed, Lonni LITTIE Nanas, MD  05/29/2024 4:45 PM    [facility]

## 2024-06-02 ENCOUNTER — Ambulatory Visit: Attending: Cardiology | Admitting: Cardiology
